# Patient Record
Sex: Male | Born: 1985 | Race: White | Hispanic: No | Marital: Single | Smoking: Current every day smoker
Health system: Southern US, Community
[De-identification: ages and names within clinical notes are randomized; demographics above are authoritative.]

## PROBLEM LIST (undated history)

## (undated) DIAGNOSIS — F32A Depression, unspecified: Secondary | ICD-10-CM

## (undated) DIAGNOSIS — F99 Mental disorder, not otherwise specified: Secondary | ICD-10-CM

## (undated) DIAGNOSIS — F419 Anxiety disorder, unspecified: Secondary | ICD-10-CM

## (undated) DIAGNOSIS — T50901A Poisoning by unspecified drugs, medicaments and biological substances, accidental (unintentional), initial encounter: Secondary | ICD-10-CM

## (undated) DIAGNOSIS — M549 Dorsalgia, unspecified: Secondary | ICD-10-CM

## (undated) HISTORY — PX: HERNIA REPAIR: SHX51

## (undated) NOTE — *Deleted (*Deleted)
D:  Patient's self inventory sheet, patient     has fair sleep, sleep medication helpful.  Fair appetite, normal energy level, good concentration.  Rated depression 7, hopeless 4, anxiety 6.  Denied withdrawals.  Denied SI.   Denied physical problems.  Physical pain, back, pain #7.  Goal is discharge.  Plans to call [facility].  Does have discharge plans. A:  Medications administered per MD orders.  Emotional support and encouragement given patient. R:  Denied SI and HI, contracts for safety.  Denied A/V hallucinations. Patient continues to lay in bed during the day.

---

## 2004-05-25 ENCOUNTER — Ambulatory Visit: Payer: Self-pay | Admitting: Psychiatry

## 2004-05-25 ENCOUNTER — Inpatient Hospital Stay (HOSPITAL_COMMUNITY): Admission: EM | Admit: 2004-05-25 | Discharge: 2004-05-28 | Payer: Self-pay | Admitting: Psychiatry

## 2004-05-29 ENCOUNTER — Other Ambulatory Visit (HOSPITAL_COMMUNITY): Admission: RE | Admit: 2004-05-29 | Discharge: 2004-07-04 | Payer: Self-pay | Admitting: Psychiatry

## 2004-06-08 ENCOUNTER — Emergency Department (HOSPITAL_COMMUNITY): Admission: EM | Admit: 2004-06-08 | Discharge: 2004-06-08 | Payer: Self-pay | Admitting: Emergency Medicine

## 2004-07-07 ENCOUNTER — Other Ambulatory Visit (HOSPITAL_COMMUNITY): Admission: RE | Admit: 2004-07-07 | Discharge: 2004-10-05 | Payer: Self-pay | Admitting: Psychiatry

## 2005-11-26 ENCOUNTER — Emergency Department (HOSPITAL_COMMUNITY): Admission: EM | Admit: 2005-11-26 | Discharge: 2005-11-26 | Payer: Self-pay | Admitting: Emergency Medicine

## 2006-03-30 ENCOUNTER — Emergency Department (HOSPITAL_COMMUNITY): Admission: EM | Admit: 2006-03-30 | Discharge: 2006-03-30 | Payer: Self-pay | Admitting: Emergency Medicine

## 2006-05-03 ENCOUNTER — Emergency Department (HOSPITAL_COMMUNITY): Admission: EM | Admit: 2006-05-03 | Discharge: 2006-05-03 | Payer: Self-pay | Admitting: Emergency Medicine

## 2006-06-05 ENCOUNTER — Emergency Department (HOSPITAL_COMMUNITY): Admission: EM | Admit: 2006-06-05 | Discharge: 2006-06-05 | Payer: Self-pay | Admitting: Emergency Medicine

## 2006-08-15 ENCOUNTER — Emergency Department (HOSPITAL_COMMUNITY): Admission: EM | Admit: 2006-08-15 | Discharge: 2006-08-15 | Payer: Self-pay | Admitting: Emergency Medicine

## 2007-02-20 ENCOUNTER — Emergency Department (HOSPITAL_COMMUNITY): Admission: EM | Admit: 2007-02-20 | Discharge: 2007-02-20 | Payer: Self-pay | Admitting: Emergency Medicine

## 2007-05-23 ENCOUNTER — Emergency Department (HOSPITAL_COMMUNITY): Admission: EM | Admit: 2007-05-23 | Discharge: 2007-05-23 | Payer: Self-pay | Admitting: Emergency Medicine

## 2007-09-30 ENCOUNTER — Emergency Department (HOSPITAL_COMMUNITY): Admission: EM | Admit: 2007-09-30 | Discharge: 2007-09-30 | Payer: Self-pay | Admitting: Emergency Medicine

## 2007-11-10 ENCOUNTER — Emergency Department (HOSPITAL_COMMUNITY): Admission: EM | Admit: 2007-11-10 | Discharge: 2007-11-11 | Payer: Self-pay | Admitting: Emergency Medicine

## 2007-11-11 ENCOUNTER — Emergency Department (HOSPITAL_COMMUNITY): Admission: EM | Admit: 2007-11-11 | Discharge: 2007-11-11 | Payer: Self-pay | Admitting: Emergency Medicine

## 2007-12-06 ENCOUNTER — Emergency Department (HOSPITAL_COMMUNITY): Admission: EM | Admit: 2007-12-06 | Discharge: 2007-12-06 | Payer: Self-pay | Admitting: Emergency Medicine

## 2007-12-10 ENCOUNTER — Emergency Department (HOSPITAL_COMMUNITY): Admission: EM | Admit: 2007-12-10 | Discharge: 2007-12-10 | Payer: Self-pay | Admitting: Emergency Medicine

## 2008-01-24 ENCOUNTER — Emergency Department (HOSPITAL_COMMUNITY): Admission: EM | Admit: 2008-01-24 | Discharge: 2008-01-24 | Payer: Self-pay | Admitting: Emergency Medicine

## 2008-04-07 ENCOUNTER — Emergency Department (HOSPITAL_COMMUNITY): Admission: EM | Admit: 2008-04-07 | Discharge: 2008-04-07 | Payer: Self-pay | Admitting: Emergency Medicine

## 2008-04-08 ENCOUNTER — Emergency Department (HOSPITAL_COMMUNITY): Admission: EM | Admit: 2008-04-08 | Discharge: 2008-04-08 | Payer: Self-pay | Admitting: Emergency Medicine

## 2008-06-23 ENCOUNTER — Emergency Department (HOSPITAL_COMMUNITY): Admission: EM | Admit: 2008-06-23 | Discharge: 2008-06-23 | Payer: Self-pay | Admitting: Emergency Medicine

## 2008-07-10 ENCOUNTER — Emergency Department (HOSPITAL_COMMUNITY): Admission: EM | Admit: 2008-07-10 | Discharge: 2008-07-10 | Payer: Self-pay | Admitting: Emergency Medicine

## 2008-07-21 ENCOUNTER — Emergency Department (HOSPITAL_COMMUNITY): Admission: EM | Admit: 2008-07-21 | Discharge: 2008-07-21 | Payer: Self-pay | Admitting: Emergency Medicine

## 2008-10-06 ENCOUNTER — Emergency Department (HOSPITAL_COMMUNITY): Admission: EM | Admit: 2008-10-06 | Discharge: 2008-10-06 | Payer: Self-pay | Admitting: Emergency Medicine

## 2009-02-20 ENCOUNTER — Emergency Department (HOSPITAL_COMMUNITY): Admission: EM | Admit: 2009-02-20 | Discharge: 2009-02-20 | Payer: Self-pay | Admitting: Emergency Medicine

## 2009-02-26 ENCOUNTER — Emergency Department (HOSPITAL_COMMUNITY): Admission: EM | Admit: 2009-02-26 | Discharge: 2009-02-26 | Payer: Self-pay | Admitting: Emergency Medicine

## 2009-04-21 ENCOUNTER — Emergency Department (HOSPITAL_COMMUNITY): Admission: EM | Admit: 2009-04-21 | Discharge: 2009-04-22 | Payer: Self-pay | Admitting: Emergency Medicine

## 2009-09-02 ENCOUNTER — Emergency Department (HOSPITAL_COMMUNITY): Admission: EM | Admit: 2009-09-02 | Discharge: 2009-09-02 | Payer: Self-pay | Admitting: Emergency Medicine

## 2009-09-10 ENCOUNTER — Emergency Department (HOSPITAL_COMMUNITY): Admission: EM | Admit: 2009-09-10 | Discharge: 2009-09-10 | Payer: Self-pay | Admitting: Emergency Medicine

## 2009-09-15 ENCOUNTER — Emergency Department: Payer: Self-pay | Admitting: Emergency Medicine

## 2009-10-06 ENCOUNTER — Ambulatory Visit: Payer: Self-pay | Admitting: Orthopedic Surgery

## 2009-10-22 ENCOUNTER — Encounter: Admission: RE | Admit: 2009-10-22 | Discharge: 2009-10-22 | Payer: Self-pay | Admitting: Sports Medicine

## 2009-10-24 ENCOUNTER — Encounter: Admission: RE | Admit: 2009-10-24 | Discharge: 2009-10-24 | Payer: Self-pay | Admitting: Orthopedic Surgery

## 2009-10-29 ENCOUNTER — Emergency Department (HOSPITAL_COMMUNITY): Admission: EM | Admit: 2009-10-29 | Discharge: 2009-10-29 | Payer: Self-pay | Admitting: Emergency Medicine

## 2010-08-25 ENCOUNTER — Emergency Department (HOSPITAL_COMMUNITY)
Admission: EM | Admit: 2010-08-25 | Discharge: 2010-08-25 | Disposition: A | Discharge status: Home / Self Care | Payer: BC Managed Care – PPO | Attending: Emergency Medicine | Admitting: Emergency Medicine

## 2010-08-25 DIAGNOSIS — Z79899 Other long term (current) drug therapy: Secondary | ICD-10-CM | POA: Insufficient documentation

## 2010-08-25 DIAGNOSIS — Z87891 Personal history of nicotine dependence: Secondary | ICD-10-CM | POA: Insufficient documentation

## 2010-08-25 DIAGNOSIS — F329 Major depressive disorder, single episode, unspecified: Secondary | ICD-10-CM | POA: Insufficient documentation

## 2010-08-25 DIAGNOSIS — F1010 Alcohol abuse, uncomplicated: Secondary | ICD-10-CM | POA: Insufficient documentation

## 2010-08-25 DIAGNOSIS — F3289 Other specified depressive episodes: Secondary | ICD-10-CM | POA: Insufficient documentation

## 2010-08-25 LAB — POCT I-STAT, CHEM 8
Hemoglobin: 17 g/dL (ref 13.0–17.0)
Sodium: 144 mEq/L (ref 135–145)
TCO2: 25 mmol/L (ref 0–100)

## 2010-08-25 LAB — RAPID URINE DRUG SCREEN, HOSP PERFORMED
Amphetamines: NOT DETECTED
Barbiturates: NOT DETECTED

## 2010-08-25 LAB — ETHANOL: Alcohol, Ethyl (B): 106 mg/dL — ABNORMAL HIGH (ref 0–10)

## 2010-09-16 LAB — URINALYSIS, ROUTINE W REFLEX MICROSCOPIC
Ketones, ur: NEGATIVE mg/dL
Nitrite: NEGATIVE
Protein, ur: NEGATIVE mg/dL
Urobilinogen, UA: 1 mg/dL (ref 0.0–1.0)
pH: 7 (ref 5.0–8.0)

## 2010-09-16 LAB — LIPASE, BLOOD: Lipase: 69 U/L — ABNORMAL HIGH (ref 11–59)

## 2010-09-16 LAB — CBC
Hemoglobin: 15.5 g/dL (ref 13.0–17.0)
MCHC: 34.5 g/dL (ref 30.0–36.0)
MCV: 94.5 fL (ref 78.0–100.0)
RBC: 4.76 MIL/uL (ref 4.22–5.81)

## 2010-09-16 LAB — DIFFERENTIAL
Basophils Absolute: 0 10*3/uL (ref 0.0–0.1)
Basophils Relative: 1 % (ref 0–1)
Eosinophils Absolute: 0.4 10*3/uL (ref 0.0–0.7)
Lymphs Abs: 2.5 10*3/uL (ref 0.7–4.0)
Neutrophils Relative %: 52 % (ref 43–77)

## 2010-09-16 LAB — COMPREHENSIVE METABOLIC PANEL
ALT: 23 U/L (ref 0–53)
CO2: 27 mEq/L (ref 19–32)
Calcium: 9.4 mg/dL (ref 8.4–10.5)
GFR calc non Af Amer: >60 mL/min (ref >60)
Glucose, Bld: 99 mg/dL (ref 70–99)
Sodium: 142 mEq/L (ref 135–145)

## 2010-10-13 LAB — COMPREHENSIVE METABOLIC PANEL
ALT: 32 U/L (ref 0–53)
Albumin: 3.8 g/dL (ref 3.5–5.2)
Alkaline Phosphatase: 64 U/L (ref 39–117)
Calcium: 9.1 mg/dL (ref 8.4–10.5)
Potassium: 3.4 mEq/L — ABNORMAL LOW (ref 3.5–5.1)
Sodium: 138 mEq/L (ref 135–145)
Total Protein: 6.4 g/dL (ref 6.0–8.3)

## 2010-10-13 LAB — DIFFERENTIAL
Basophils Relative: 0 % (ref 0–1)
Eosinophils Absolute: 0.1 10*3/uL (ref 0.0–0.7)
Lymphs Abs: 1.3 10*3/uL (ref 0.7–4.0)
Monocytes Absolute: 0.8 10*3/uL (ref 0.1–1.0)
Monocytes Relative: 13 % — ABNORMAL HIGH (ref 3–12)
Neutro Abs: 3.9 10*3/uL (ref 1.7–7.7)

## 2010-10-13 LAB — CBC
Platelets: 143 10*3/uL — ABNORMAL LOW (ref 150–400)
RDW: 12.6 % (ref 11.5–15.5)

## 2010-10-22 ENCOUNTER — Other Ambulatory Visit: Payer: Self-pay | Admitting: Internal Medicine

## 2010-10-22 DIAGNOSIS — R748 Abnormal levels of other serum enzymes: Secondary | ICD-10-CM

## 2010-10-27 ENCOUNTER — Other Ambulatory Visit: Payer: BC Managed Care – PPO

## 2010-10-30 ENCOUNTER — Emergency Department (HOSPITAL_COMMUNITY)
Admission: EM | Admit: 2010-10-30 | Discharge: 2010-10-30 | Disposition: A | Discharge status: Home / Self Care | Payer: BC Managed Care – PPO | Attending: Emergency Medicine | Admitting: Emergency Medicine

## 2010-10-30 DIAGNOSIS — R5381 Other malaise: Secondary | ICD-10-CM | POA: Insufficient documentation

## 2010-10-30 DIAGNOSIS — R42 Dizziness and giddiness: Secondary | ICD-10-CM | POA: Insufficient documentation

## 2010-10-30 DIAGNOSIS — E876 Hypokalemia: Secondary | ICD-10-CM | POA: Insufficient documentation

## 2010-10-30 DIAGNOSIS — R197 Diarrhea, unspecified: Secondary | ICD-10-CM | POA: Insufficient documentation

## 2010-10-30 DIAGNOSIS — R110 Nausea: Secondary | ICD-10-CM | POA: Insufficient documentation

## 2010-10-30 LAB — URINALYSIS, ROUTINE W REFLEX MICROSCOPIC
Glucose, UA: NEGATIVE mg/dL
Hgb urine dipstick: NEGATIVE
Protein, ur: NEGATIVE mg/dL
pH: 7 (ref 5.0–8.0)

## 2010-10-31 LAB — POCT I-STAT, CHEM 8
BUN: 8 mg/dL (ref 6–23)
Chloride: 95 mEq/L — ABNORMAL LOW (ref 96–112)
Creatinine, Ser: 1.1 mg/dL (ref 0.4–1.5)
Sodium: 132 mEq/L — ABNORMAL LOW (ref 135–145)
TCO2: 26 mmol/L (ref 0–100)

## 2010-11-10 ENCOUNTER — Emergency Department (HOSPITAL_COMMUNITY): Admission: EM | Admit: 2010-11-10 | Payer: BC Managed Care – PPO | Source: Home / Self Care

## 2010-11-10 ENCOUNTER — Emergency Department (INDEPENDENT_AMBULATORY_CARE_PROVIDER_SITE_OTHER): Payer: BC Managed Care – PPO

## 2010-11-10 ENCOUNTER — Emergency Department (HOSPITAL_BASED_OUTPATIENT_CLINIC_OR_DEPARTMENT_OTHER)
Admission: EM | Admit: 2010-11-10 | Discharge: 2010-11-10 | Disposition: A | Discharge status: Home / Self Care | Payer: BC Managed Care – PPO | Attending: Emergency Medicine | Admitting: Emergency Medicine

## 2010-11-10 DIAGNOSIS — F17200 Nicotine dependence, unspecified, uncomplicated: Secondary | ICD-10-CM | POA: Insufficient documentation

## 2010-11-10 DIAGNOSIS — M545 Low back pain, unspecified: Secondary | ICD-10-CM | POA: Insufficient documentation
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
## 2010-12-30 ENCOUNTER — Emergency Department (HOSPITAL_COMMUNITY)
Admission: EM | Admit: 2010-12-30 | Discharge: 2010-12-30 | Disposition: A | Discharge status: Home / Self Care | Payer: BC Managed Care – PPO | Attending: Emergency Medicine | Admitting: Emergency Medicine

## 2010-12-30 DIAGNOSIS — H60399 Other infective otitis externa, unspecified ear: Secondary | ICD-10-CM | POA: Insufficient documentation

## 2010-12-30 DIAGNOSIS — H6120 Impacted cerumen, unspecified ear: Secondary | ICD-10-CM | POA: Insufficient documentation

## 2010-12-31 ENCOUNTER — Emergency Department (HOSPITAL_BASED_OUTPATIENT_CLINIC_OR_DEPARTMENT_OTHER)
Admission: EM | Admit: 2010-12-31 | Discharge: 2010-12-31 | Disposition: A | Discharge status: Home / Self Care | Payer: BC Managed Care – PPO | Attending: Emergency Medicine | Admitting: Emergency Medicine

## 2010-12-31 DIAGNOSIS — H9209 Otalgia, unspecified ear: Secondary | ICD-10-CM | POA: Insufficient documentation

## 2010-12-31 DIAGNOSIS — H60399 Other infective otitis externa, unspecified ear: Secondary | ICD-10-CM | POA: Insufficient documentation

## 2011-04-07 LAB — CBC
MCHC: 34.9
MCV: 90.8
RBC: 5.41
RDW: 12.8

## 2011-04-07 LAB — DIFFERENTIAL
Basophils Absolute: 0
Basophils Relative: 1
Eosinophils Absolute: 0.4
Monocytes Absolute: 0.7
Monocytes Relative: 10
Neutro Abs: 2.7
Neutrophils Relative %: 37 — ABNORMAL LOW

## 2011-04-07 LAB — HEPATIC FUNCTION PANEL
ALT: 19
AST: 29
Total Protein: 6.5

## 2011-04-07 LAB — URINALYSIS, ROUTINE W REFLEX MICROSCOPIC
Glucose, UA: NEGATIVE
Hgb urine dipstick: NEGATIVE
Protein, ur: NEGATIVE
Specific Gravity, Urine: 1.015
pH: 6.5

## 2011-04-07 LAB — POCT CARDIAC MARKERS
CKMB, poc: 1.1
Myoglobin, poc: 37.4

## 2011-04-07 LAB — BASIC METABOLIC PANEL
CO2: 28
Calcium: 9.4
Chloride: 108
Creatinine, Ser: 1.26
GFR calc Af Amer: >60
Glucose, Bld: 95

## 2011-04-07 LAB — RAPID URINE DRUG SCREEN, HOSP PERFORMED
Amphetamines: NOT DETECTED
Barbiturates: NOT DETECTED
Benzodiazepines: NOT DETECTED
Cocaine: NOT DETECTED
Opiates: NOT DETECTED

## 2011-04-07 LAB — D-DIMER, QUANTITATIVE: D-Dimer, Quant: <0.22

## 2011-05-06 ENCOUNTER — Encounter (HOSPITAL_COMMUNITY): Payer: Self-pay | Admitting: Emergency Medicine

## 2011-05-06 ENCOUNTER — Emergency Department (HOSPITAL_COMMUNITY)
Admission: EM | Admit: 2011-05-06 | Discharge: 2011-05-06 | Disposition: A | Discharge status: Home / Self Care | Payer: BC Managed Care – PPO | Attending: Emergency Medicine | Admitting: Emergency Medicine

## 2011-05-06 ENCOUNTER — Emergency Department (HOSPITAL_COMMUNITY): Payer: BC Managed Care – PPO

## 2011-05-06 DIAGNOSIS — W219XXA Striking against or struck by unspecified sports equipment, initial encounter: Secondary | ICD-10-CM | POA: Insufficient documentation

## 2011-05-06 DIAGNOSIS — Y9322 Activity, ice hockey: Secondary | ICD-10-CM | POA: Insufficient documentation

## 2011-05-06 DIAGNOSIS — S0990XA Unspecified injury of head, initial encounter: Secondary | ICD-10-CM

## 2011-05-06 DIAGNOSIS — Y9239 Other specified sports and athletic area as the place of occurrence of the external cause: Secondary | ICD-10-CM | POA: Insufficient documentation
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
## 2011-05-07 NOTE — ED Provider Notes (Signed)
Medical screening examination/treatment/procedure(s) were performed by non-physician practitioner and as supervising physician I was immediately available for consultation/collaboration.   Taite Schoeppner M Verle Wheeling, DO 05/07/11 0337 

## 2012-04-21 ENCOUNTER — Encounter (HOSPITAL_COMMUNITY): Payer: Self-pay | Admitting: Family Medicine

## 2012-04-21 ENCOUNTER — Emergency Department (HOSPITAL_COMMUNITY)
Admission: EM | Admit: 2012-04-21 | Discharge: 2012-04-21 | Disposition: A | Discharge status: Home / Self Care | Payer: Self-pay | Attending: Emergency Medicine | Admitting: Emergency Medicine

## 2012-04-21 DIAGNOSIS — F1010 Alcohol abuse, uncomplicated: Secondary | ICD-10-CM | POA: Insufficient documentation

## 2012-04-21 LAB — CBC
HCT: 47.7 % (ref 39.0–52.0)
MCV: 91.9 fL (ref 78.0–100.0)
Platelets: 174 10*3/uL (ref 150–400)
RBC: 5.19 MIL/uL (ref 4.22–5.81)
WBC: 7.2 10*3/uL (ref 4.0–10.5)

## 2012-04-21 LAB — URINALYSIS, ROUTINE W REFLEX MICROSCOPIC
Leukocytes, UA: NEGATIVE
Protein, ur: NEGATIVE mg/dL
Urobilinogen, UA: 1 mg/dL (ref 0.0–1.0)

## 2012-04-21 LAB — COMPREHENSIVE METABOLIC PANEL
AST: 122 U/L — ABNORMAL HIGH (ref 0–37)
Alkaline Phosphatase: 80 U/L (ref 39–117)
CO2: 27 mEq/L (ref 19–32)
Chloride: 107 mEq/L (ref 96–112)
Creatinine, Ser: 1.17 mg/dL (ref 0.50–1.35)
GFR calc non Af Amer: 85 mL/min — ABNORMAL LOW (ref >90)
Total Bilirubin: 1 mg/dL (ref 0.3–1.2)

## 2012-04-21 LAB — RAPID URINE DRUG SCREEN, HOSP PERFORMED
Opiates: NOT DETECTED
Tetrahydrocannabinol: POSITIVE — AB

## 2012-04-21 LAB — SALICYLATE LEVEL: Salicylate Lvl: <2 mg/dL — ABNORMAL LOW (ref 2.8–20.0)

## 2012-04-21 MED ORDER — NICOTINE 21 MG/24HR TD PT24
21.0000 mg | MEDICATED_PATCH | Freq: Once | TRANSDERMAL | Status: DC
  Administered 2012-04-21: 21 mg via TRANSDERMAL
  Filled 2012-04-21: qty 1

## 2012-04-21 NOTE — ED Notes (Signed)
Patient brought here by [employer] for medical clearance. Patient states that he has an alcohol addiction and is requesting detox. States that he has used [employer], opiates, marijuana and heroin. Last used alcohol "within the hour." Reports that he has been drinking 1/5 of Fireball whiskey per day.

## 2012-12-09 ENCOUNTER — Emergency Department (HOSPITAL_COMMUNITY): Payer: Self-pay

## 2012-12-09 ENCOUNTER — Encounter (HOSPITAL_COMMUNITY): Payer: Self-pay | Admitting: Emergency Medicine

## 2012-12-09 ENCOUNTER — Emergency Department (HOSPITAL_COMMUNITY)
Admission: EM | Admit: 2012-12-09 | Discharge: 2012-12-10 | Disposition: A | Discharge status: Home / Self Care | Payer: BC Managed Care – PPO | Attending: Emergency Medicine | Admitting: Emergency Medicine

## 2012-12-09 DIAGNOSIS — F1010 Alcohol abuse, uncomplicated: Secondary | ICD-10-CM

## 2012-12-09 DIAGNOSIS — H538 Other visual disturbances: Secondary | ICD-10-CM | POA: Insufficient documentation

## 2012-12-09 DIAGNOSIS — R45851 Suicidal ideations: Secondary | ICD-10-CM

## 2012-12-09 DIAGNOSIS — R55 Syncope and collapse: Secondary | ICD-10-CM | POA: Insufficient documentation

## 2012-12-09 DIAGNOSIS — M549 Dorsalgia, unspecified: Secondary | ICD-10-CM | POA: Insufficient documentation

## 2012-12-09 HISTORY — DX: Dorsalgia, unspecified: M54.9

## 2012-12-09 LAB — COMPREHENSIVE METABOLIC PANEL
Albumin: 3.3 g/dL — ABNORMAL LOW (ref 3.5–5.2)
BUN: 4 mg/dL — ABNORMAL LOW (ref 6–23)
Calcium: 8.9 mg/dL (ref 8.4–10.5)
Creatinine, Ser: 1.13 mg/dL (ref 0.50–1.35)
Potassium: 4.1 mEq/L (ref 3.5–5.1)
Total Protein: 7.1 g/dL (ref 6.0–8.3)

## 2012-12-09 LAB — CBC
HCT: 47.3 % (ref 39.0–52.0)
MCH: 34.1 pg — ABNORMAL HIGH (ref 26.0–34.0)
MCHC: 35.7 g/dL (ref 30.0–36.0)
MCV: 95.4 fL (ref 78.0–100.0)
RDW: 13.9 % (ref 11.5–15.5)

## 2012-12-09 LAB — RAPID URINE DRUG SCREEN, HOSP PERFORMED
Amphetamines: NOT DETECTED
Benzodiazepines: NOT DETECTED
Cocaine: NOT DETECTED
Opiates: NOT DETECTED

## 2012-12-09 LAB — ETHANOL: Alcohol, Ethyl (B): 296 mg/dL — ABNORMAL HIGH (ref 0–11)

## 2012-12-09 MED ORDER — FOLIC ACID 1 MG PO TABS
1.0000 mg | ORAL_TABLET | Freq: Every day | ORAL | Status: DC
  Filled 2012-12-09: qty 1

## 2012-12-09 MED ORDER — ALUM & MAG HYDROXIDE-SIMETH 200-200-20 MG/5ML PO SUSP: 30.0000 mL | ORAL | Status: DC | PRN

## 2012-12-09 MED ORDER — LORAZEPAM 1 MG PO TABS
1.0000 mg | ORAL_TABLET | Freq: Three times a day (TID) | ORAL | Status: DC | PRN
  Administered 2012-12-09: 1 mg via ORAL
  Filled 2012-12-09: qty 1

## 2012-12-09 MED ORDER — ZOLPIDEM TARTRATE 5 MG PO TABS: 5.0000 mg | ORAL_TABLET | Freq: Every evening | ORAL | Status: DC | PRN

## 2012-12-09 MED ORDER — ONDANSETRON HCL 4 MG PO TABS
4.0000 mg | ORAL_TABLET | Freq: Three times a day (TID) | ORAL | Status: DC | PRN
  Administered 2012-12-10: 4 mg via ORAL
  Filled 2012-12-09: qty 1

## 2012-12-09 MED ORDER — NICOTINE 21 MG/24HR TD PT24
21.0000 mg | MEDICATED_PATCH | Freq: Every day | TRANSDERMAL | Status: DC
  Administered 2012-12-09: 21 mg via TRANSDERMAL
  Filled 2012-12-09 (×2): qty 1

## 2012-12-09 MED ORDER — IBUPROFEN 600 MG PO TABS: 600.0000 mg | ORAL_TABLET | Freq: Three times a day (TID) | ORAL | Status: DC | PRN

## 2012-12-09 MED ORDER — THIAMINE HCL 100 MG/ML IJ SOLN: 100.0000 mg | Freq: Every day | INTRAMUSCULAR | Status: DC

## 2012-12-09 MED ORDER — ADULT MULTIVITAMIN W/MINERALS CH
1.0000 | ORAL_TABLET | Freq: Every day | ORAL | Status: DC
  Filled 2012-12-09: qty 1

## 2012-12-09 MED ORDER — VITAMIN B-1 100 MG PO TABS
100.0000 mg | ORAL_TABLET | Freq: Every day | ORAL | Status: DC
  Administered 2012-12-10: 100 mg via ORAL
  Filled 2012-12-09: qty 1

## 2012-12-09 NOTE — ED Provider Notes (Signed)
History    This chart was scribed for Raymon Mutton, non-physician practitioner working with Vida Roller, MD by Leone Payor, ED Scribe. This patient was seen in room WTR4/WLPT4 and the patient's care was started at 1844.   CSN: [id number]  Arrival date & time 12/09/12  1844   First MD Initiated Contact with Patient 12/09/12 1920      Chief Complaint  Patient presents with  . ETOH detox      The history is provided by the patient. No language interpreter was used.    HPI Comments: Larry Adams is a 27 y.o. male brought in by ambulance, who presents to the Emergency Department requesting medical clearance today. Per EMS note, pt was at [facility] early this afternoon seeking ETOH detox. He was directed to come to ED to be evaluated but he went home instead. He started drinking again. He takes 6mg  of klonopin daily. Family called EMS to transport pt to the ED. Pt reports he has been drinking enough to get drunk everyday for the past 6 years. He drinks beer and liquor both. He reports feeling agitated with himself lately. He has been feeling upset and sad, has been crying a few times in the past 1.5 days. Both his parents are alcoholics. His alcoholism has progressed since he lost his cousin 2 years ago. He has SI and states he doesn't have much to live for. He denies having detox in the past. Pt reports falling down steps yesterday. He fell again on his back today. Pt hit his head during the fall and believes he blacked out for a few seconds today after his fall. He has associated back pain and HA from the fall. He also has associated nausea and vomiting (from drinking). Last alcohol use (2 beers) was 3 hours ago. He denies having shaking or tremors, HI currently. He denies dizziness, blurred vision, numbness or tingling, neck pain, chest pain, SOB.  He admits to using marijuana but denies cocaine or heroin use. Pt is a current everyday smoker.   Past Medical History  Diagnosis Date  . Back  pain     Past Surgical History  Procedure Laterality Date  . Hernia repair      No family history on file.  History  Substance Use Topics  . Smoking status: Never Smoker   . Smokeless tobacco: Not on file  . Alcohol Use: Yes     Comment: 1/5 whiskey per day      Review of Systems  HENT: Negative for neck pain.   Eyes: Positive for photophobia. Negative for visual disturbance.  Respiratory: Negative for shortness of breath.   Cardiovascular: Negative for chest pain.  Gastrointestinal: Negative for nausea, vomiting, abdominal pain and diarrhea.  Musculoskeletal: Positive for back pain.  Neurological: Positive for syncope. Negative for dizziness, tremors, weakness and numbness.  Psychiatric/Behavioral: Positive for suicidal ideas and agitation. Negative for confusion.  All other systems reviewed and are negative.    Allergies  Review of patient's allergies indicates no known allergies.  Home Medications   Current Outpatient Rx  Name  Route  Sig  Dispense  Refill  . clonazePAM (KLONOPIN) 1 MG tablet   Oral   Take 1 mg by mouth 2 (two) times daily as needed. sleep           BP 122/74  Pulse 115  Temp(Src) 98.7 F (37.1 C) (Oral)  Resp 20  SpO2 95%  Physical Exam  Nursing note and vitals reviewed. Constitutional: He  is oriented to person, place, and time. He appears well-developed and well-nourished. No distress.  HENT:  Head: Normocephalic and atraumatic.  Eyes: Conjunctivae and EOM are normal. Pupils are equal, round, and reactive to light.  Neck: Normal range of motion. Neck supple.  Cardiovascular: Normal rate, regular rhythm and normal heart sounds.  Exam reveals no friction rub.   No murmur heard. Pulmonary/Chest: Effort normal and breath sounds normal. No respiratory distress. He has no wheezes. He has no rales.  Abdominal: Soft. Bowel sounds are normal. There is no tenderness.  Obese   Musculoskeletal: Normal range of motion.  Lymphadenopathy:     He has no cervical adenopathy.  Neurological: He is alert and oriented to person, place, and time. No cranial nerve deficit.  Cranial nerves III-XII grossly intact  Skin: Skin is warm and dry.  Psychiatric: He has a normal mood and affect. His behavior is normal.  Negative shakes and tremors noted    ED Course  Procedures (including critical care time)  DIAGNOSTIC STUDIES: Oxygen Saturation is 95% on RA, adequate by my interpretation.    COORDINATION OF CARE: 7:41 PM Discussed treatment plan with pt at bedside and pt agreed to plan.   8:04 PM Spoke with Patsy Lager from ACT team.   Labs Reviewed - No data to display No results found.   No diagnosis found.    MDM  I personally performed the services described in this documentation, which was scribed in my presence. The recorded information has been reviewed and is accurate.  Patient presenting to the ED with ETOH abuse and wishes for detox. Patient reported that he has been feeling depressed over the past couple of days. Stated that he has "no reason to live' - has no plan. Denied thoughts of hurting others. ACT consult performed. CIWA every 4 hours. Telepsych ordered. Patient medically cleared. Patient moved to psych ED. Discussed case with Dr. Jenean Lindau at change of shift, transfer of care to Dr. Jenean Lindau at 2:30AM 12/10/2012.  Raymon Mutton, PA-C 12/10/12 [phone]

## 2012-12-09 NOTE — ED Notes (Signed)
Per EMS, was at [facility] early this afternoon seeking etoh detox-was told to come to ed for eval-went home instead and started drinking and taking klonopin-family called EMS to transport to hospital

## 2012-12-09 NOTE — ED Notes (Signed)
Pt  has been seen by security and has been changed into scrubs.   Pt has one bag of belongings.

## 2012-12-09 NOTE — ED Notes (Signed)
Pt, has been drinking for 2 weeks straight-last drink was 1 hour ago

## 2012-12-10 ENCOUNTER — Inpatient Hospital Stay (HOSPITAL_COMMUNITY): Admission: AD | Admit: 2012-12-10 | Payer: BC Managed Care – PPO | Source: Intra-hospital | Admitting: Psychiatry

## 2012-12-10 ENCOUNTER — Telehealth (HOSPITAL_COMMUNITY): Payer: Self-pay | Admitting: Licensed Clinical Social Worker

## 2012-12-10 DIAGNOSIS — F39 Unspecified mood [affective] disorder: Secondary | ICD-10-CM

## 2012-12-10 DIAGNOSIS — F1010 Alcohol abuse, uncomplicated: Secondary | ICD-10-CM

## 2012-12-10 LAB — URINE MICROSCOPIC-ADD ON

## 2012-12-10 LAB — URINALYSIS, ROUTINE W REFLEX MICROSCOPIC
Glucose, UA: NEGATIVE mg/dL
Hgb urine dipstick: NEGATIVE
Ketones, ur: NEGATIVE mg/dL
Protein, ur: NEGATIVE mg/dL
pH: 6 (ref 5.0–8.0)

## 2012-12-10 MED ORDER — LORAZEPAM 1 MG PO TABS
0.0000 mg | ORAL_TABLET | Freq: Four times a day (QID) | ORAL | Status: DC
  Administered 2012-12-10: 1 mg via ORAL

## 2012-12-10 MED ORDER — ADULT MULTIVITAMIN W/MINERALS CH
1.0000 | ORAL_TABLET | Freq: Every day | ORAL | Status: DC
  Administered 2012-12-10: 1 via ORAL

## 2012-12-10 MED ORDER — VITAMIN B-1 100 MG PO TABS: 100.0000 mg | ORAL_TABLET | Freq: Every day | ORAL | Status: DC

## 2012-12-10 MED ORDER — FOLIC ACID 1 MG PO TABS
1.0000 mg | ORAL_TABLET | Freq: Every day | ORAL | Status: DC
  Administered 2012-12-10 (×2): 1 mg via ORAL

## 2012-12-10 MED ORDER — LORAZEPAM 1 MG PO TABS: 0.0000 mg | ORAL_TABLET | Freq: Two times a day (BID) | ORAL | Status: DC

## 2012-12-10 MED ORDER — LORAZEPAM 1 MG PO TABS
1.0000 mg | ORAL_TABLET | Freq: Four times a day (QID) | ORAL | Status: DC | PRN
  Filled 2012-12-10: qty 1

## 2012-12-10 MED ORDER — LORAZEPAM 2 MG/ML IJ SOLN: 1.0000 mg | Freq: Four times a day (QID) | INTRAMUSCULAR | Status: DC | PRN

## 2012-12-10 MED ORDER — THIAMINE HCL 100 MG/ML IJ SOLN: 100.0000 mg | Freq: Every day | INTRAMUSCULAR | Status: DC

## 2012-12-10 NOTE — Progress Notes (Signed)
Patient ID: Larry Adams, male   DOB: 04-28-1986, 28 y.o.   MRN: [id number] Duplicate note

## 2012-12-10 NOTE — [facility] Counselor (Signed)
Patient accepted to [facility] by Leonette Most, [place] 12/09/2012 to Dr. Elsie Saas. The room assignment is 506-1. Bobby-ACT counselor made aware of patient's acceptance. Per AC-Sh[facility]ita patient to come after 3pm.

## 2012-12-10 NOTE — Progress Notes (Signed)
[facility] MD Progress Note  12/10/2012 1:30 PM Larry Adams  MRN:  [id number] Subjective:  Interviewed patient this afternoon who has a bed in our inpatient unit for alcohol detoxification but is refusing admission. Patient stated he wanted to detox here in the [facility] for only one day and that he is not willing to stay or go inpatient.  Patient is here voluntarily and is willing to go home to take care of family issues. He reports to this Clinical research associate that his father need him now at  home.  He also stated he is aware he need help but he is not ready at this time.  Patient reports he has been diagnosed by his primary care doctor with bipolar and anxiety.  He also stated that his primary care doctor orders Klonopin and Librium for him.  He stated he uses Klonopin for anxiety and bipolar and uses librium to detox at home.  Patient reports he has some left over Librium in his truck and that he longer uses them due to GI disconfort.  He was advised to send the Librium to the nearest Pharmacy since he is no longer using them.  Patient verbalizes understanding.  He is fully awake, alert and oriented x4.  He denies SI/HI/AVH.  He will be d/c by Dr Cordelia Poche. Diagnosis:   Axis I: Alcohol Abuse and Mood Disorder NOS Axis II: Deferred Axis III:  Past Medical History  Diagnosis Date  . Back pain    Axis IV: educational problems, other psychosocial or environmental problems and problems related to social environment Axis V: 61-70 mild symptoms  ADL's:  Impaired  Sleep: Good  Appetite:  Fair  Suicidal Ideation:  Plan:  none Intent:  none Means:  none Homicidal Ideation:  Plan:  none Intent:  none Means:  none AEB (as evidenced by):  Psychiatric Specialty Exam: Review of Systems  Constitutional: Negative.   HENT: Negative.   Eyes: Negative.   Respiratory: Negative.   Cardiovascular: Negative.   Gastrointestinal: Negative.   Musculoskeletal: Negative.   Skin: Negative.   Endo/Heme/Allergies: Negative.    Psychiatric/Behavioral: Positive for depression (Reports hx og bipolar d/o by his medical provider) and substance abuse (Reports long hx of alcoholism since age 89). Negative for suicidal ideas, hallucinations and memory loss. The patient is nervous/anxious (Placed on Klonopin x 5 yeasr for bipolar by his primary physician). The patient does not have insomnia.     Blood pressure 154/69, pulse 105, temperature 98.1 F (36.7 C), temperature source Oral, resp. rate 20, SpO2 95.00%.There is no height or weight on file to calculate BMI.  General Appearance: Disheveled  Eye Solicitor::  Fair  Speech:  Clear and Coherent  Volume:  Normal  Mood:  Depressed  Affect:  Congruent  Thought Process:  Coherent  Orientation:  Full (Time, Place, and Person)  Thought Content:  WDL  Suicidal Thoughts:  No  Homicidal Thoughts:  No  Memory:  Immediate;   Good Recent;   Good Remote;   Good  Judgement:  Poor  Insight:  Fair  Psychomotor Activity:  Normal  Concentration:  Good  Recall:  Good  Akathisia:  No  Handed:  Right  AIMS (if indicated):     Assets:  Desire for Improvement  Sleep:      Current Medications: Current Facility-Administered Medications  Medication Dose Route Frequency Provider Last Rate Last Dose  . alum & mag hydroxide-simeth (MAALOX/MYLANTA) 200-200-20 MG/5ML suspension 30 mL  30 mL Oral PRN Marissa Sciacca, PA-C      .  folic acid (FOLVITE) tablet 1 mg  1 mg Oral Daily Marissa Sciacca, PA-C      . folic acid (FOLVITE) tablet 1 mg  1 mg Oral Daily Olivia Mackie, MD   1 mg at 12/10/12 8413  . ibuprofen (ADVIL,MOTRIN) tablet 600 mg  600 mg Oral Q8H PRN Marissa Sciacca, PA-C      . LORazepam (ATIVAN) tablet 1 mg  1 mg Oral Q6H PRN Olivia Mackie, MD       Or  . LORazepam (ATIVAN) injection 1 mg  1 mg Intravenous Q6H PRN Olivia Mackie, MD      . LORazepam (ATIVAN) tablet 0-4 mg  0-4 mg Oral Q6H Olivia Mackie, MD   1 mg at 12/10/12 2440   Followed by  . [START ON 12/12/2012] LORazepam  (ATIVAN) tablet 0-4 mg  0-4 mg Oral Q12H Olivia Mackie, MD      . LORazepam (ATIVAN) tablet 1 mg  1 mg Oral Q8H PRN Marissa Sciacca, PA-C   1 mg at 12/09/12 2144  . multivitamin with minerals tablet 1 tablet  1 tablet Oral Daily Marissa Sciacca, PA-C      . multivitamin with minerals tablet 1 tablet  1 tablet Oral Daily Olivia Mackie, MD   1 tablet at 12/10/12 [phone]  . nicotine (NICODERM CQ - dosed in mg/24 hours) patch 21 mg  21 mg Transdermal Daily Marissa Sciacca, PA-C   21 mg at 12/09/12 2143  . ondansetron (ZOFRAN) tablet 4 mg  4 mg Oral Q8H PRN Marissa Sciacca, PA-C   4 mg at 12/10/12 0951  . thiamine (VITAMIN B-1) tablet 100 mg  100 mg Oral Daily Marissa Sciacca, PA-C   100 mg at 12/10/12 2536   Or  . thiamine (B-1) injection 100 mg  100 mg Intravenous Daily Marissa Sciacca, PA-C      . zolpidem (AMBIEN) tablet 5 mg  5 mg Oral QHS PRN Raymon Mutton, PA-C       Current Outpatient Prescriptions  Medication Sig Dispense Refill  . clonazePAM (KLONOPIN) 1 MG tablet Take 1 mg by mouth 2 (two) times daily as needed. sleep        Lab Results:  Results for orders placed during the hospital encounter of 12/09/12 (from the past 48 hour(s))  URINE RAPID DRUG SCREEN (HOSP PERFORMED)     Status: Abnormal   Collection Time    12/09/12  7:30 PM      Result Value Range   Opiates NONE DETECTED  NONE DETECTED   Cocaine NONE DETECTED  NONE DETECTED   Benzodiazepines NONE DETECTED  NONE DETECTED   Amphetamines NONE DETECTED  NONE DETECTED   Tetrahydrocannabinol POSITIVE (*) NONE DETECTED   Barbiturates NONE DETECTED  NONE DETECTED   Comment:            DRUG SCREEN FOR MEDICAL PURPOSES     ONLY.  IF CONFIRMATION IS NEEDED     FOR ANY PURPOSE, NOTIFY LAB     WITHIN 5 DAYS.                LOWEST DETECTABLE LIMITS     FOR URINE DRUG SCREEN     Drug Class       Cutoff (ng/mL)     Amphetamine      1000     Barbiturate      200     Benzodiazepine   200     Tricyclics  300     Opiates           300     Cocaine          300     THC              50  URINALYSIS, ROUTINE W REFLEX MICROSCOPIC     Status: Abnormal   Collection Time    12/09/12  7:30 PM      Result Value Range   Color, Urine AMBER (*) YELLOW   Comment: BIOCHEMICALS MAY BE AFFECTED BY COLOR   APPearance CLOUDY (*) CLEAR   Specific Gravity, Urine 1.023  1.005 - 1.030   pH 6.0  5.0 - 8.0   Glucose, UA NEGATIVE  NEGATIVE mg/dL   Hgb urine dipstick NEGATIVE  NEGATIVE   Bilirubin Urine SMALL (*) NEGATIVE   Ketones, ur NEGATIVE  NEGATIVE mg/dL   Protein, ur NEGATIVE  NEGATIVE mg/dL   Urobilinogen, UA 1.0  0.0 - 1.0 mg/dL   Nitrite NEGATIVE  NEGATIVE   Leukocytes, UA TRACE (*) NEGATIVE  URINE MICROSCOPIC-ADD ON     Status: Abnormal   Collection Time    12/09/12  7:30 PM      Result Value Range   WBC, UA 0-2  <3 WBC/hpf   Crystals CA OXALATE CRYSTALS (*) NEGATIVE   Urine-Other MUCOUS PRESENT    CBC     Status: Abnormal   Collection Time    12/09/12  7:40 PM      Result Value Range   WBC 7.1  4.0 - 10.5 K/uL   RBC 4.96  4.22 - 5.81 MIL/uL   Hemoglobin 16.9  13.0 - 17.0 g/dL   HCT 96.0  45.4 - 09.8 %   MCV 95.4  78.0 - 100.0 fL   MCH 34.1 (*) 26.0 - 34.0 pg   MCHC 35.7  30.0 - 36.0 g/dL   RDW 11.9  14.7 - 82.9 %   Platelets 183  150 - 400 K/uL  COMPREHENSIVE METABOLIC PANEL     Status: Abnormal   Collection Time    12/09/12  7:40 PM      Result Value Range   Sodium 146 (*) 135 - 145 mEq/L   Potassium 4.1  3.5 - 5.1 mEq/L   Chloride 112  96 - 112 mEq/L   CO2 24  19 - 32 mEq/L   Glucose, Bld 109 (*) 70 - 99 mg/dL   BUN 4 (*) 6 - 23 mg/dL   Creatinine, Ser 5.62  0.50 - 1.35 mg/dL   Calcium 8.9  8.4 - 13.0 mg/dL   Total Protein 7.1  6.0 - 8.3 g/dL   Albumin 3.3 (*) 3.5 - 5.2 g/dL   AST 865 (*) 0 - 37 U/L   ALT 79 (*) 0 - 53 U/L   Alkaline Phosphatase 129 (*) 39 - 117 U/L   Total Bilirubin 1.1  0.3 - 1.2 mg/dL   GFR calc non Af Amer 88 (*) >90 mL/min   GFR calc Af Amer >90  >90 mL/min   Comment:             The eGFR has been calculated     using the CKD EPI equation.     This calculation has not been     validated in all clinical     situations.     eGFR's persistently     <90 mL/min signify     possible Chronic Kidney Disease.  ETHANOL  Status: Abnormal   Collection Time    12/09/12  7:40 PM      Result Value Range   Alcohol, Ethyl (B) 296 (*) 0 - 11 mg/dL   Comment:            LOWEST DETECTABLE LIMIT FOR     SERUM ALCOHOL IS 11 mg/dL     FOR MEDICAL PURPOSES ONLY    Physical Findings: AIMS:  , ,  ,  ,    CIWA:  CIWA-Ar Total: 11 COWS:     Treatment Plan Summary: Daily contact with patient to assess and evaluate symptoms and progress in treatment Medication management  Plan:  Consult with Dr Freida Busman .  Discussed patient's refusal to be admitted to inpatient unit for detox from alcohol.  Patient will be d/c now.  Medical Decision Making Problem Points:  Established problem, stable/improving (1) Data Points:  Review and summation of old records (2)  I certify that inpatient services furnished can reasonably be expected to improve the patient's condition.   Dahlia Byes, C 12/10/2012, 1:30 PM

## 2012-12-10 NOTE — ED Notes (Signed)
C/o nausea, gingerale given.

## 2012-12-10 NOTE — Progress Notes (Addendum)
Patient ID: Larry Adams, male   DOB: 05/04/1986, 27 y.o.   MRN: [id number] S-Pt requesting Etoh rx -c/o daily intoxication x 6 years O BAC 296-Sleeping Heavy odor of etoh on breath     No Beds at [facility] presently     ACT unable to place ar [facility] A Alcohol dependence with acute intoxication P Continue Medical Clearance/Consider [facility] admission if bed becomes available

## 2012-12-10 NOTE — BH Assessment (Signed)
Assessment Note   Larry Adams is an 27 y.o. male. Patient presents to [facility] requesting detox from alcohol. Patient is drinking daily as much as he can get his hands on and whatever he can get his hands on (beer or liquor). He denies suicidality, homicidality or psychosis. Patient states that he is ready for a change and seriously wants treatment.  [facility] will not have beds until Monday and patient does not have transportation for [facility]. Patient's information will be sent to [facility] for consideration for admission.  Axis I: Alcohol Dependence and THC Abuse Axis II: Deferred Axis III:  Past Medical History  Diagnosis Date  . Back pain    Axis IV: economic problems, housing problems, occupational problems, other psychosocial or environmental problems, problems related to social environment and problems with primary support group Axis V: 45  Past Medical History:  Past Medical History  Diagnosis Date  . Back pain     Past Surgical History  Procedure Laterality Date  . Hernia repair      Family History: No family history on file.  Social History:  reports that he has never smoked. He does not have any smokeless tobacco history on file. He reports that  drinks alcohol. He reports that he uses illicit drugs (Marijuana).  Additional Social History:  Alcohol / Drug Use History of alcohol / drug use?: Yes Substance #1 Name of Substance 1: Etoh 1 - Age of First Use: 17 1 - Amount (size/oz): "whatever is available" 1 - Frequency: Daily 1 - Duration: 6 years 1 - Last Use / Amount: 12/09/2012/ " A lot" Substance #2 Name of Substance 2: THC 2 - Age of First Use: 17 2 - Amount (size/oz): varies 2 - Frequency: varies 2 - Duration: years  CIWA: CIWA-Ar BP: 138/84 mmHg Pulse Rate: 99 Nausea and Vomiting: no nausea and no vomiting Tactile Disturbances: none Tremor: not visible, but can be felt fingertip to fingertip Auditory Disturbances: not present Paroxysmal Sweats: three Visual  Disturbances: not present Anxiety: three Headache, Fullness in Head: mild Agitation: two Orientation and Clouding of Sensorium: oriented and can do serial additions CIWA-Ar Total: 11 COWS:    Allergies: No Known Allergies  Home Medications:  (Not in a hospital admission)  OB/GYN Status:  No LMP for male patient.  General Assessment Data Location of Assessment: [facility] ED Living Arrangements: Other (Comment) (Homeless) Can pt return to current living arrangement?: Yes Admission Status: Voluntary Is patient capable of signing voluntary admission?: Yes Transfer from: Home Referral Source: MD  Education Status Is patient currently in school?: No Current Grade:  (Na) Highest grade of school patient has completed:  (12th) Name of school: [employer] Contact person: Ellingson Rahmir/ father  Risk to self Suicidal Ideation: No Suicidal Intent: No Is patient at risk for suicide?: No Suicidal Plan?: No Access to Means: No What has been your use of drugs/alcohol within the last 12 months?:  (Daily) Previous Attempts/Gestures: No How many times?:  (Na) Other Self Harm Risks:  (Na) Triggers for Past Attempts: None known Intentional Self Injurious Behavior: None Family Suicide History: No Recent stressful life event(s):  (Homeless) Persecutory voices/beliefs?: No Depression: No Depression Symptoms:  (None) Substance abuse history and/or treatment for substance abuse?: Yes Suicide prevention information given to non-admitted patients: Not applicable  Risk to Others Homicidal Ideation: No Thoughts of Harm to Others: No Current Homicidal Intent: No Current Homicidal Plan: No Access to Homicidal Means: No Identified Victim:  (Na) History of harm to others?: No  Assessment of Violence: None Noted Violent Behavior Description:  (Na) Does patient have access to weapons?: No Criminal Charges Pending?: No Does patient have a court date: No  Psychosis Hallucinations: None  noted Delusions: None noted  Mental Status Report Appear/Hygiene: Disheveled Eye Contact: Fair Motor Activity: Freedom of movement;Unsteady Speech: Logical/coherent Level of Consciousness: Drowsy Mood: Sullen Affect: Appropriate to circumstance Anxiety Level: Minimal Thought Processes: Coherent;Relevant Judgement: Unimpaired Orientation: Place;Person;Time;Situation Obsessive Compulsive Thoughts/Behaviors: None  Cognitive Functioning Concentration: Decreased Memory: Recent Intact;Remote Intact IQ: Average Insight: Poor Impulse Control: Poor Appetite: Fair Weight Loss:  (None noted) Weight Gain:  (None noted) Sleep: Decreased Total Hours of Sleep:  (varies) Vegetative Symptoms: None  ADLScreening [facility] Assessment Services) Patient's cognitive ability adequate to safely complete daily activities?: Yes Patient able to express need for assistance with ADLs?: Yes Independently performs ADLs?: Yes (appropriate for developmental age)  Abuse/Neglect [facility]) Physical Abuse: Denies Verbal Abuse: Denies Sexual Abuse: Denies  Prior Inpatient Therapy Prior Inpatient Therapy: No Prior Therapy Dates:  (Na) Prior Therapy Facilty/Provider(s):  (Na) Reason for Treatment:  (Na)  Prior Outpatient Therapy Prior Outpatient Therapy: No Prior Therapy Dates:  (Na) Prior Therapy Facilty/Provider(s):  (Na) Reason for Treatment:  (Na)  ADL Screening (condition at time of admission) Patient's cognitive ability adequate to safely complete daily activities?: Yes Patient able to express need for assistance with ADLs?: Yes Independently performs ADLs?: Yes (appropriate for developmental age)       Abuse/Neglect Assessment (Assessment to be complete while patient is alone) Physical Abuse: Denies Verbal Abuse: Denies Sexual Abuse: Denies Values / Beliefs Cultural Requests During Hospitalization: None Spiritual Requests During Hospitalization: None   Advance Directives (For  Healthcare) Advance Directive: Patient does not have advance directive;Patient would not like information    Additional Information 1:1 In Past 12 Months?: No CIRT Risk: No Elopement Risk: No Does patient have medical clearance?: No     Disposition:  Disposition Initial Assessment Completed for this Encounter: Yes Disposition of Patient: Other dispositions;Referred to ([facility])  On Site Evaluation by:   Reviewed with Physician:     Rudi Coco 12/10/2012 1:15 AM

## 2012-12-10 NOTE — ED Notes (Signed)
Patients father is coming in to visit and he doesn't want to be at [facility] for this weekend, is asking to be discharged and will come back at a later date.

## 2012-12-10 NOTE — ED Provider Notes (Signed)
Medical screening examination/treatment/procedure(s) were performed by non-physician practitioner and as supervising physician I was immediately available for consultation/collaboration.    Vida Roller, MD 12/10/12 1001

## 2012-12-18 ENCOUNTER — Emergency Department (HOSPITAL_COMMUNITY)
Admission: EM | Admit: 2012-12-18 | Discharge: 2012-12-18 | Disposition: A | Discharge status: Home / Self Care | Payer: BC Managed Care – PPO

## 2012-12-18 ENCOUNTER — Encounter (HOSPITAL_BASED_OUTPATIENT_CLINIC_OR_DEPARTMENT_OTHER): Payer: Self-pay | Admitting: *Deleted

## 2012-12-18 ENCOUNTER — Emergency Department (HOSPITAL_BASED_OUTPATIENT_CLINIC_OR_DEPARTMENT_OTHER)
Admission: EM | Admit: 2012-12-18 | Discharge: 2012-12-18 | Disposition: A | Discharge status: Home / Self Care | Payer: Self-pay | Attending: Emergency Medicine | Admitting: Emergency Medicine

## 2012-12-18 ENCOUNTER — Emergency Department (HOSPITAL_BASED_OUTPATIENT_CLINIC_OR_DEPARTMENT_OTHER): Payer: Self-pay

## 2012-12-18 DIAGNOSIS — Y9289 Other specified places as the place of occurrence of the external cause: Secondary | ICD-10-CM | POA: Insufficient documentation

## 2012-12-18 DIAGNOSIS — Y9389 Activity, other specified: Secondary | ICD-10-CM | POA: Insufficient documentation

## 2012-12-18 DIAGNOSIS — R296 Repeated falls: Secondary | ICD-10-CM | POA: Insufficient documentation

## 2012-12-18 DIAGNOSIS — S301XXA Contusion of abdominal wall, initial encounter: Secondary | ICD-10-CM | POA: Insufficient documentation

## 2012-12-18 DIAGNOSIS — Z8739 Personal history of other diseases of the musculoskeletal system and connective tissue: Secondary | ICD-10-CM | POA: Insufficient documentation

## 2012-12-18 DIAGNOSIS — F1010 Alcohol abuse, uncomplicated: Secondary | ICD-10-CM | POA: Insufficient documentation

## 2012-12-18 DIAGNOSIS — S301XXS Contusion of abdominal wall, sequela: Secondary | ICD-10-CM

## 2012-12-18 LAB — RAPID URINE DRUG SCREEN, HOSP PERFORMED
Amphetamines: NOT DETECTED
Cocaine: NOT DETECTED
Opiates: NOT DETECTED
Tetrahydrocannabinol: POSITIVE — AB

## 2012-12-18 LAB — CBC WITH DIFFERENTIAL/PLATELET
Basophils Absolute: 0.1 10*3/uL (ref 0.0–0.1)
Eosinophils Relative: 7 % — ABNORMAL HIGH (ref 0–5)
Lymphocytes Relative: 43 % (ref 12–46)
Lymphs Abs: 3.1 10*3/uL (ref 0.7–4.0)
MCV: 96.9 fL (ref 78.0–100.0)
Neutrophils Relative %: 30 % — ABNORMAL LOW (ref 43–77)
Platelets: 146 10*3/uL — ABNORMAL LOW (ref 150–400)
RBC: 4.86 MIL/uL (ref 4.22–5.81)
RDW: 13.7 % (ref 11.5–15.5)
WBC: 7.3 10*3/uL (ref 4.0–10.5)

## 2012-12-18 LAB — URINALYSIS, ROUTINE W REFLEX MICROSCOPIC
Leukocytes, UA: NEGATIVE
Nitrite: NEGATIVE
Protein, ur: NEGATIVE mg/dL
Specific Gravity, Urine: 1.005 (ref 1.005–1.030)
Urobilinogen, UA: 0.2 mg/dL (ref 0.0–1.0)

## 2012-12-18 LAB — COMPREHENSIVE METABOLIC PANEL
Alkaline Phosphatase: 121 U/L — ABNORMAL HIGH (ref 39–117)
BUN: 6 mg/dL (ref 6–23)
Chloride: 106 mEq/L (ref 96–112)
Creatinine, Ser: 1.1 mg/dL (ref 0.50–1.35)
GFR calc Af Amer: >90 mL/min (ref >90)
Glucose, Bld: 76 mg/dL (ref 70–99)
Potassium: 3.9 mEq/L (ref 3.5–5.1)
Total Bilirubin: 1.3 mg/dL — ABNORMAL HIGH (ref 0.3–1.2)
Total Protein: 7.1 g/dL (ref 6.0–8.3)

## 2012-12-18 LAB — ETHANOL: Alcohol, Ethyl (B): 171 mg/dL — ABNORMAL HIGH (ref 0–11)

## 2012-12-18 MED ORDER — SODIUM CHLORIDE 0.9 % IV SOLN
INTRAVENOUS | Status: DC
  Administered 2012-12-18: 02:00:00 via INTRAVENOUS

## 2012-12-18 MED ORDER — IOHEXOL 300 MG/ML  SOLN
100.0000 mL | Freq: Once | INTRAMUSCULAR | Status: AC | PRN
  Administered 2012-12-18: 100 mL via INTRAVENOUS

## 2012-12-18 NOTE — ED Provider Notes (Signed)
History     CSN: [id number]  Arrival date & time 12/18/12  0056   First MD Initiated Contact with Patient 12/18/12 0113      Chief Complaint  Patient presents with  . Abdominal Injury    (Consider location/radiation/quality/duration/timing/severity/associated sxs/prior treatment) HPI This is a 27 year old male who was cleaning a pool about a week ago and to prevent himself falling and landed with his abdomen against the wall pool. He subsequently developed a large area of ecchymosis on his left lower abdomen with a central indurated area. He was having sexual intercourse about an hour ago and the pain was exacerbated. He states the pain is now about an 8/10. He states the central indurated area, "knot", has been growing in size. He was recently released from alcohol and drug detox but admits to recent alcohol consumption ("2 beers"). He denies nausea or vomiting but states he is having difficulty passing stool.  Past Medical History  Diagnosis Date  . Back pain     Past Surgical History  Procedure Laterality Date  . Hernia repair      History reviewed. No pertinent family history.  History  Substance Use Topics  . Smoking status: Never Smoker   . Smokeless tobacco: Not on file  . Alcohol Use: Yes     Comment: 1/5 whiskey per day      Review of Systems  All other systems reviewed and are negative.    Allergies  Review of patient's allergies indicates no known allergies.  Home Medications   Current Outpatient Rx  Name  Route  Sig  Dispense  Refill  . clonazePAM (KLONOPIN) 1 MG tablet   Oral   Take 1 mg by mouth 2 (two) times daily as needed. sleep           BP 127/81  Pulse 114  Temp(Src) 98.3 F (36.8 C) (Oral)  Resp 20  Ht 6' (1.829 m)  Wt 280 lb (127.007 kg)  BMI 37.97 kg/m2  SpO2 96%  Physical Exam General: Well-developed, well-nourished male in no acute distress; appearance consistent with age of record HENT: normocephalic, atraumatic Eyes:  pupils equal round and reactive to light; extraocular muscles intact Neck: supple Heart: regular rate and rhythm Lungs: clear to auscultation bilaterally Abdomen: soft; nondistended; football-sized ecchymosis of the left lower abdomen with central tender mass; bowel sounds decreased Extremities: No deformity; full range of motion; pulses normal; trace edema of lower legs Neurologic: Awake, alert and oriented; motor function intact in all extremities and symmetric; no facial droop Skin: Warm and dry Psychiatric: Flat affect    ED Course  Procedures (including critical care time)     MDM   Nursing notes and vitals signs, including pulse oximetry, reviewed.  Summary of this visit's results, reviewed by myself:  Labs:  Results for orders placed during the hospital encounter of 12/18/12 (from the past 24 hour(s))  URINALYSIS, ROUTINE W REFLEX MICROSCOPIC     Status: None   Collection Time    12/18/12  1:13 AM      Result Value Range   Color, Urine YELLOW  YELLOW   APPearance CLEAR  CLEAR   Specific Gravity, Urine 1.005  1.005 - 1.030   pH 6.0  5.0 - 8.0   Glucose, UA NEGATIVE  NEGATIVE mg/dL   Hgb urine dipstick NEGATIVE  NEGATIVE   Bilirubin Urine NEGATIVE  NEGATIVE   Ketones, ur NEGATIVE  NEGATIVE mg/dL   Protein, ur NEGATIVE  NEGATIVE mg/dL   Urobilinogen,  UA 0.2  0.0 - 1.0 mg/dL   Nitrite NEGATIVE  NEGATIVE   Leukocytes, UA NEGATIVE  NEGATIVE  URINE RAPID DRUG SCREEN (HOSP PERFORMED)     Status: Abnormal   Collection Time    12/18/12  1:24 AM      Result Value Range   Opiates NONE DETECTED  NONE DETECTED   Cocaine NONE DETECTED  NONE DETECTED   Benzodiazepines NONE DETECTED  NONE DETECTED   Amphetamines NONE DETECTED  NONE DETECTED   Tetrahydrocannabinol POSITIVE (*) NONE DETECTED   Barbiturates NONE DETECTED  NONE DETECTED  CBC WITH DIFFERENTIAL     Status: Abnormal   Collection Time    12/18/12  1:43 AM      Result Value Range   WBC 7.3  4.0 - 10.5 K/uL   RBC  4.86  4.22 - 5.81 MIL/uL   Hemoglobin 16.8  13.0 - 17.0 g/dL   HCT 16.1  09.6 - 04.5 %   MCV 96.9  78.0 - 100.0 fL   MCH 34.6 (*) 26.0 - 34.0 pg   MCHC 35.7  30.0 - 36.0 g/dL   RDW 40.9  81.1 - 91.4 %   Platelets 146 (*) 150 - 400 K/uL   Neutrophils Relative % 30 (*) 43 - 77 %   Neutro Abs 2.2  1.7 - 7.7 K/uL   Lymphocytes Relative 43  12 - 46 %   Lymphs Abs 3.1  0.7 - 4.0 K/uL   Monocytes Relative 19 (*) 3 - 12 %   Monocytes Absolute 1.4 (*) 0.1 - 1.0 K/uL   Eosinophils Relative 7 (*) 0 - 5 %   Eosinophils Absolute 0.5  0.0 - 0.7 K/uL   Basophils Relative 1  0 - 1 %   Basophils Absolute 0.1  0.0 - 0.1 K/uL  COMPREHENSIVE METABOLIC PANEL     Status: Abnormal   Collection Time    12/18/12  1:43 AM      Result Value Range   Sodium 141  135 - 145 mEq/L   Potassium 3.9  3.5 - 5.1 mEq/L   Chloride 106  96 - 112 mEq/L   CO2 26  19 - 32 mEq/L   Glucose, Bld 76  70 - 99 mg/dL   BUN 6  6 - 23 mg/dL   Creatinine, Ser 7.82  0.50 - 1.35 mg/dL   Calcium 9.6  8.4 - 95.6 mg/dL   Total Protein 7.1  6.0 - 8.3 g/dL   Albumin 3.4 (*) 3.5 - 5.2 g/dL   AST 213 (*) 0 - 37 U/L   ALT 67 (*) 0 - 53 U/L   Alkaline Phosphatase 121 (*) 39 - 117 U/L   Total Bilirubin 1.3 (*) 0.3 - 1.2 mg/dL   GFR calc non Af Amer >90  >90 mL/min   GFR calc Af Amer >90  >90 mL/min  ETHANOL     Status: Abnormal   Collection Time    12/18/12  1:43 AM      Result Value Range   Alcohol, Ethyl (B) 171 (*) 0 - 11 mg/dL    Imaging Studies: Ct Abdomen Pelvis W Contrast  12/18/2012   *RADIOLOGY REPORT*  Clinical Data: Abdominal injury, fell striking the abdomen, bruising and knot at left lower quadrant, pain, fell into a pool while cleaning it  CT ABDOMEN AND PELVIS WITH CONTRAST  Technique:  Multidetector CT imaging of the abdomen and pelvis was performed following the standard protocol during bolus administration of intravenous contrast.  Sagittal and coronal MPR images reconstructed from axial data set.  Contrast:  OMNIPAQUE IOHEXOL 300 MG/ML  SOLN No oral contrast administered.  Comparison: None  Findings: Lung bases clear. Marked fatty infiltration of liver. No focal abnormalities of the liver, spleen, pancreas, kidneys, or adrenal glands. Retroaortic left renal vein. Spontaneous infrahepatic portocaval shunt. Stomach and bowel loops normal appearance.  Normal appendix. Unremarkable bladder and ureters. No intra-abdominal mass, adenopathy, or free fluid. Subcutaneous contusion left lower quadrant with small central hematoma 2.6 cm diameter, associated with scattered stranding of surrounding subcutaneous fat. No acute osseous findings.  IMPRESSION: No acute intra abdominal or intrapelvic abnormalities. Subcutaneous contusion and small hematoma in left lower quadrant abdominal wall. Marked fatty infiltration of liver. Spontaneous portacaval shunt.   Original Report Authenticated By: Ulyses Southward, M.D.   2:53 AM Patient advised to CT findings. Patient was also advised that he was legally intoxicated and it was inappropriate for him to be driving. He states that detox has taught him to regulate" his drinking and he again insists he only had 2 beers         Hanley Seamen, MD 12/18/12 [phone]

## 2012-12-18 NOTE — ED Notes (Signed)
Transported to [place] 

## 2012-12-18 NOTE — ED Notes (Signed)
Pt states he "fell into a pool while cleaning it" about a week ago "after getting out of detox". Large area of bruising and a knot to LLQ. Admits to drinking 2 pints of beer and taking 2 Klonopin. "Didn't flare up until he and his girlfriend had intercourse"

## 2012-12-18 NOTE — ED Notes (Signed)
Returned from CT.

## 2013-01-03 ENCOUNTER — Encounter (HOSPITAL_COMMUNITY): Payer: Self-pay | Admitting: *Deleted

## 2013-01-03 ENCOUNTER — Emergency Department (HOSPITAL_COMMUNITY)
Admission: EM | Admit: 2013-01-03 | Discharge: 2013-01-04 | Disposition: A | Discharge status: Home / Self Care | Payer: BC Managed Care – PPO | Attending: Emergency Medicine | Admitting: Emergency Medicine

## 2013-01-03 DIAGNOSIS — F3112 Bipolar disorder, current episode manic without psychotic features, moderate: Secondary | ICD-10-CM

## 2013-01-03 DIAGNOSIS — F1210 Cannabis abuse, uncomplicated: Secondary | ICD-10-CM | POA: Insufficient documentation

## 2013-01-03 DIAGNOSIS — Z8659 Personal history of other mental and behavioral disorders: Secondary | ICD-10-CM | POA: Insufficient documentation

## 2013-01-03 DIAGNOSIS — F1910 Other psychoactive substance abuse, uncomplicated: Secondary | ICD-10-CM

## 2013-01-03 DIAGNOSIS — IMO0002 Reserved for concepts with insufficient information to code with codable children: Secondary | ICD-10-CM

## 2013-01-03 DIAGNOSIS — F1010 Alcohol abuse, uncomplicated: Secondary | ICD-10-CM | POA: Insufficient documentation

## 2013-01-03 DIAGNOSIS — F919 Conduct disorder, unspecified: Secondary | ICD-10-CM | POA: Insufficient documentation

## 2013-01-03 HISTORY — DX: Mental disorder, not otherwise specified: F99

## 2013-01-03 HISTORY — DX: Anxiety disorder, unspecified: F41.9

## 2013-01-03 LAB — RAPID URINE DRUG SCREEN, HOSP PERFORMED
Amphetamines: NOT DETECTED
Barbiturates: NOT DETECTED
Benzodiazepines: POSITIVE — AB
Tetrahydrocannabinol: POSITIVE — AB

## 2013-01-03 LAB — CBC
MCHC: 35.4 g/dL (ref 30.0–36.0)
Platelets: 191 10*3/uL (ref 150–400)
RDW: 13.5 % (ref 11.5–15.5)
WBC: 7.4 10*3/uL (ref 4.0–10.5)

## 2013-01-03 LAB — COMPREHENSIVE METABOLIC PANEL
ALT: 73 U/L — ABNORMAL HIGH (ref 0–53)
AST: 149 U/L — ABNORMAL HIGH (ref 0–37)
Albumin: 3.5 g/dL (ref 3.5–5.2)
Alkaline Phosphatase: 119 U/L — ABNORMAL HIGH (ref 39–117)
Chloride: 104 mEq/L (ref 96–112)
Potassium: 3.9 mEq/L (ref 3.5–5.1)
Sodium: 139 mEq/L (ref 135–145)
Total Protein: 7.5 g/dL (ref 6.0–8.3)

## 2013-01-03 LAB — ETHANOL: Alcohol, Ethyl (B): <11 mg/dL (ref 0–11)

## 2013-01-03 MED ORDER — LORAZEPAM 1 MG PO TABS
1.0000 mg | ORAL_TABLET | Freq: Three times a day (TID) | ORAL | Status: DC | PRN
  Administered 2013-01-03: 1 mg via ORAL
  Filled 2013-01-03: qty 1

## 2013-01-03 MED ORDER — ZOLPIDEM TARTRATE 5 MG PO TABS: 5.0000 mg | ORAL_TABLET | Freq: Every evening | ORAL | Status: DC | PRN

## 2013-01-03 MED ORDER — NICOTINE 21 MG/24HR TD PT24
21.0000 mg | MEDICATED_PATCH | Freq: Every day | TRANSDERMAL | Status: DC
  Administered 2013-01-04: 21 mg via TRANSDERMAL
  Filled 2013-01-03: qty 1

## 2013-01-03 MED ORDER — IBUPROFEN 200 MG PO TABS: 600.0000 mg | ORAL_TABLET | Freq: Three times a day (TID) | ORAL | Status: DC | PRN

## 2013-01-03 MED ORDER — ONDANSETRON HCL 4 MG PO TABS: 4.0000 mg | ORAL_TABLET | Freq: Three times a day (TID) | ORAL | Status: DC | PRN

## 2013-01-03 MED ORDER — ACETAMINOPHEN 325 MG PO TABS: 650.0000 mg | ORAL_TABLET | ORAL | Status: DC | PRN

## 2013-01-03 NOTE — ED Notes (Signed)
Patient is awake and alert, denies HI,SI, AH or VH. Pt is cooperative and pleasant. Patient c/o increased anxiety.(6/10)  Interacts well with staff and others. Encouraged and support offered.  Will continue to monitor for safety.Q15 min check.

## 2013-01-03 NOTE — ED Provider Notes (Signed)
   History    CSN: [id number] Arrival date & time 01/03/13  1308  First MD Initiated Contact with Patient 01/03/13 1829     Chief Complaint  Patient presents with  . Medical Clearance   (Consider location/radiation/quality/duration/timing/severity/associated sxs/prior Treatment) HPI  Patient is a 27 year old male past medical history significant for anxiety, bipolar, presented to the emergency department or hospital and aggressive behavior for referral to this department. [employer] tract with IVC paperwork for patient. According to [employer] and IVC paperwork patient has not been taking his medications for bipolar depression and he has also been using marijuana and alcohol. Patient denies any suicidal or homicidal ideations at this time. He denies any auditory or visual hallucinations. He also denies any alcohol or recreational drug use. Patient has no physical complaints at this time.  Past Medical History  Diagnosis Date  . Back pain   . Anxiety    Past Surgical History  Procedure Laterality Date  . Hernia repair     History reviewed. No pertinent family history. History  Substance Use Topics  . Smoking status: Never Smoker   . Smokeless tobacco: Not on file  . Alcohol Use: Yes     Comment: 1/5 whiskey per day    Review of Systems  Constitutional: Negative for fever and chills.  Respiratory: Negative for shortness of breath.   Cardiovascular: Negative for chest pain.  Gastrointestinal: Negative for vomiting and diarrhea.  Psychiatric/Behavioral: Negative for suicidal ideas and self-injury.  All other systems reviewed and are negative.    Allergies  Morphine and related  Home Medications  No current outpatient prescriptions on file. BP 138/99  Pulse 98  Temp(Src) 98.3 F (36.8 C) (Oral)  Resp 18  SpO2 97% Physical Exam  Constitutional: He is oriented to person, place, and time. He appears well-developed and well-nourished. No distress.  HENT:  Head: Normocephalic and  atraumatic.  Eyes: Conjunctivae are normal.  Neck: Neck supple.  Cardiovascular: Normal rate, regular rhythm and normal heart sounds.   Pulmonary/Chest: Effort normal and breath sounds normal.  Abdominal: Soft.  Neurological: He is alert and oriented to person, place, and time.  Skin: Skin is warm and dry. He is not diaphoretic.  Psychiatric: He has a normal mood and affect.    ED Course  Procedures (including critical care time) Labs Reviewed  COMPREHENSIVE METABOLIC PANEL - Abnormal; Notable for the following:    Glucose, Bld 100 (*)    BUN 5 (*)    AST 149 (*)    ALT 73 (*)    Alkaline Phosphatase 119 (*)    Total Bilirubin 2.7 (*)    All other components within normal limits  URINE RAPID DRUG SCREEN (HOSP PERFORMED) - Abnormal; Notable for the following:    Benzodiazepines POSITIVE (*)    Tetrahydrocannabinol POSITIVE (*)    All other components within normal limits  CBC  ETHANOL   No results found. 1. Behavior problem     MDM  Patient presenting for aggressive and hostile behavior with [facility] with IVC paperwork. According to the IVC paperwork patient has not been taking his medications and has been abusing marijuana and alcohol. Labs reviewed. ACT team consulted. Psych hold orders placed. Patient will be moved to Psych ED for further evaluation.   Lise Auer Dynastie Knoop, PA-C 01/03/13 2000

## 2013-01-03 NOTE — ED Provider Notes (Signed)
  Medical screening examination/treatment/procedure(s) were performed by non-physician practitioner and as supervising physician I was immediately available for consultation/collaboration.    Gerhard Munch, MD 01/03/13 2350

## 2013-01-03 NOTE — ED Notes (Signed)
Patient has one bag of belongings in locker 26. 

## 2013-01-03 NOTE — ED Notes (Signed)
Pt arrives by [employer] with IVC papers. Per paperwork pt is hostile and aggressive, has bipolar and depression and has not been taking medication. Abusing marijuana and alcohol. Paperwork also reports pt is SI.

## 2013-01-04 ENCOUNTER — Encounter (HOSPITAL_COMMUNITY): Payer: Self-pay | Admitting: *Deleted

## 2013-01-04 DIAGNOSIS — IMO0002 Reserved for concepts with insufficient information to code with codable children: Secondary | ICD-10-CM

## 2013-01-04 DIAGNOSIS — F1910 Other psychoactive substance abuse, uncomplicated: Secondary | ICD-10-CM

## 2013-01-04 MED ORDER — HYDROXYZINE HCL 25 MG PO TABS
25.0000 mg | ORAL_TABLET | Freq: Four times a day (QID) | ORAL | Status: DC | PRN
  Administered 2013-01-04: 25 mg via ORAL
  Filled 2013-01-04: qty 14

## 2013-01-04 MED ORDER — TRAZODONE HCL 50 MG PO TABS
50.0000 mg | ORAL_TABLET | Freq: Every evening | ORAL | Status: DC | PRN
  Administered 2013-01-04: 50 mg via ORAL
  Filled 2013-01-04: qty 1

## 2013-01-04 NOTE — ED Provider Notes (Signed)
Psych team evaluated the patient. Plan is to discharge them per their recommendations - see below   Plan  Patient does not meet criteria for inpatient psychiatric services at this time. Recommend to discharge patient home with his father. Recommend to followup with his primary care physician for medication management. Recommend social worker to help outpatient support services and psychiatric care. Discuss safety plan with the patient that anytime having active suicidal thoughts or homicidal thoughts and he to call 911 or go to local emergency room.  elected to discharge.           Derwood Kaplan, MD 01/04/13 1122

## 2013-01-04 NOTE — Progress Notes (Signed)
[facility] did not get to see patient but will be sending information to him about the [facility], using the address provided.

## 2013-01-04 NOTE — BH Assessment (Signed)
Assessment Note   Larry Adams is a 26 y.o. male who presents via IVC petition initiated by his mother. Pt brought in for psych evaluation due to hostile and aggressive behavior.  Per petition, pt has been destroying property in the home and has made several statements that he is SI w/no plan to harm himself.  Reportedly, pt is paranoid, refusing to leave his room and constantly believes that people are talking about him.  During the interview, pt adamantly denies these allegations--denies SI/HI/AVH, stating that he was arrested on 01/02/13 for an assault charge 23yrs ago.  Pt says he doesn't remember the details of the charge.    Pt says he contacted his mother to pick him up from jail after his release and she didn't want to get him out. He says that she posted bond but initiated IVC petition before he was released and he was picked and brought to hospital  Pt says that his mother lives in [place] and initiated papers while still in [place].  Explained to patient that this is incorrect because mother's signature is on papers.  Pt says he and his mother have always had difficult relationship.  Pt has inpt admission with [facility] in 2005 and currently engages in outpatient services with [facility] for med mgt in [place] last appt was 2 wks ago.  Pt has no therapist.  Pt is prescribed Zoloft and Xanax and says he takes when medication when needed, citing the last time he took his medications was 01/02/13 because he was locked up.  Due to pt.'s denial of petition allegations, Donell Sievert, PA is requesting psych eval by on-coming psychiatrist to complete disposition and rescind IVC papers if necessary.      Axis I: Depressive Disorder NOS Axis II: Deferred Axis III:  Past Medical History  Diagnosis Date  . Back pain   . Anxiety   . Mental disorder    Axis IV: other psychosocial or environmental problems, problems related to legal system/crime, problems related to social environment and  problems with primary support group Axis V: 51-60 moderate symptoms  Past Medical History:  Past Medical History  Diagnosis Date  . Back pain   . Anxiety   . Mental disorder     Past Surgical History  Procedure Laterality Date  . Hernia repair      Family History: History reviewed. No pertinent family history.  Social History:  reports that he has never smoked. He does not have any smokeless tobacco history on file. He reports that  drinks alcohol. He reports that he uses illicit drugs (Marijuana).  Additional Social History:  Alcohol / Drug Use Pain Medications: None  Prescriptions: Zoloft, Xanax  Over the Counter: None  History of alcohol / drug use?: Yes Longest period of sobriety (when/how long): None  Negative Consequences of Use: Work / School;Legal;Financial;Personal relationships Withdrawal Symptoms: Other (Comment) (No current w/d sxs ) Substance #1 Name of Substance 1: THC  1 - Age of First Use: Teens  1 - Amount (size/oz): 1 Joint  1 - Frequency: Wkly  1 - Duration: On-going  1 - Last Use / Amount: 01/02/13  CIWA: CIWA-Ar BP: 109/66 mmHg Pulse Rate: 80 COWS:    Allergies:  Allergies  Allergen Reactions  . Morphine And Related Hives    Home Medications:  (Not in a hospital admission)  OB/GYN Status:  No LMP for male patient.  General Assessment Data Location of Assessment: [facility] Living Arrangements: Parent (Lives with father ) Can  pt return to current living arrangement?: Yes Admission Status: Involuntary Is patient capable of signing voluntary admission?: No Transfer from: Acute Hospital Referral Source: MD  Education Status Is patient currently in school?: No Current Grade: None  Highest grade of school patient has completed: None  Name of school: None  Contact person: None   Risk to self Suicidal Ideation: No-Not Currently/Within Last 6 Months Suicidal Intent: No-Not Currently/Within Last 6 Months Is patient at risk for suicide?:  No Suicidal Plan?: No-Not Currently/Within Last 6 Months Access to Means: No What has been your use of drugs/alcohol within the last 12 months?: Abusing: THC  Previous Attempts/Gestures: No How many times?: 0 Other Self Harm Risks: None  Triggers for Past Attempts: None known Intentional Self Injurious Behavior: None Family Suicide History: No Recent stressful life event(s): Conflict (Comment) (Relational issues w/mother; Recent arrest on 01/02/13 ) Persecutory voices/beliefs?: No Depression: No Depression Symptoms:  (None reported ) Substance abuse history and/or treatment for substance abuse?: Yes Suicide prevention information given to non-admitted patients: Not applicable  Risk to Others Homicidal Ideation: No Thoughts of Harm to Others: No Current Homicidal Intent: No Current Homicidal Plan: No Access to Homicidal Means: No Identified Victim: None  History of harm to others?: No Assessment of Violence: None Noted Violent Behavior Description: None  Does patient have access to weapons?: No Criminal Charges Pending?: No Does patient have a court date: No  Psychosis Hallucinations: None noted Delusions: None noted  Mental Status Report Appear/Hygiene: Disheveled;Poor hygiene Eye Contact: Fair Motor Activity: Unremarkable Speech: Logical/coherent Level of Consciousness: Alert Mood: Sullen Affect: Sullen Anxiety Level: None Thought Processes: Coherent;Relevant Judgement: Unimpaired Orientation: Person;Place;Time;Situation Obsessive Compulsive Thoughts/Behaviors: None  Cognitive Functioning Concentration: Normal Memory: Recent Intact;Remote Intact IQ: Average Insight: Fair Impulse Control: Fair Appetite: Good Weight Loss: 0 Weight Gain: 0 Sleep: Decreased Total Hours of Sleep:  (Varies ) Vegetative Symptoms: None  ADLScreening [facility]) Patient's cognitive ability adequate to safely complete daily activities?: Yes Patient able to express  need for assistance with ADLs?: Yes Independently performs ADLs?: Yes (appropriate for developmental age)  Abuse/Neglect [facility]) Physical Abuse: Denies Verbal Abuse: Denies Sexual Abuse: Denies  Prior Inpatient Therapy Prior Inpatient Therapy: No Prior Therapy Dates: None  Prior Therapy Facilty/Provider(s): None  Reason for Treatment: None   Prior Outpatient Therapy Prior Outpatient Therapy: Yes Prior Therapy Dates: Current  Prior Therapy Facilty/Provider(s): [facility]  Reason for Treatment: Med Mgt   ADL Screening (condition at time of admission) Patient's cognitive ability adequate to safely complete daily activities?: Yes Is the patient deaf or have difficulty hearing?: No Does the patient have difficulty seeing, even when wearing glasses/contacts?: No Does the patient have difficulty concentrating, remembering, or making decisions?: No Patient able to express need for assistance with ADLs?: Yes Does the patient have difficulty dressing or bathing?: No Independently performs ADLs?: Yes (appropriate for developmental age) Does the patient have difficulty walking or climbing stairs?: No Weakness of Legs: None Weakness of Arms/Hands: None  Home Assistive Devices/Equipment Home Assistive Devices/Equipment: None  Therapy Consults (therapy consults require a physician order) PT Evaluation Needed: No OT Evalulation Needed: No SLP Evaluation Needed: No Abuse/Neglect Assessment (Assessment to be complete while patient is alone) Physical Abuse: Denies Verbal Abuse: Denies Sexual Abuse: Denies Exploitation of patient/patient's resources: Denies Self-Neglect: Denies Values / Beliefs Cultural Requests During Hospitalization: None Spiritual Requests During Hospitalization: None Consults Spiritual Care Consult Needed: No Social Work Consult Needed: No Merchant navy officer (For Healthcare) Advance Directive: Patient does not  have advance directive;Patient would not like  information Pre-existing out of facility DNR order (yellow form or pink MOST form): No Nutrition Screen- [facility] Patient's home diet: Regular  Additional Information 1:1 In Past 12 Months?: No CIRT Risk: No Elopement Risk: No Does patient have medical clearance?: Yes     Disposition:  Disposition Initial Assessment Completed for this Encounter: Yes Disposition of Patient: Other dispositions (Pending AM psych eval ) Other disposition(s): Other (Comment) (Pending am psych eval )  On Site Evaluation by:   Reviewed with Physician:     Murrell Redden 01/04/2013 4:36 AM

## 2013-01-04 NOTE — Consult Note (Signed)
Reason for Consult:Eval for IP psychiatric care Referring Physician: Jeraldine Loots MD  Larry Adams is an 27 y.o. male.  HPI: Pt is a 27 y/o well nourished Wm with reported h/o of bipolar d/o admitted to [facility] ED under IVC orders by his mother due to aggressive behaviors, destruction of personal property, medical non compliance, reported selling of Rx medications, substance abuse and SI without reported plan. The patient whom was recently incarcerated denies the above findings and states he's been compliant with his Rx medications, denies SI/SA/HI or AVH and can contract for safety at this present time.  Past Medical History  Diagnosis Date  . Back pain   . Anxiety     Past Surgical History  Procedure Laterality Date  . Hernia repair      History reviewed. No pertinent family history.  Social History:  reports that he has never smoked. He does not have any smokeless tobacco history on file. He reports that  drinks alcohol. He reports that he uses illicit drugs (Marijuana).  Allergies:  Allergies  Allergen Reactions  . Morphine And Related Hives    Medications: I have reviewed the patient's current medications.  Results for orders placed during the hospital encounter of 01/03/13 (from the past 48 hour(s))  CBC     Status: None   Collection Time    01/03/13  6:40 PM      Result Value Range   WBC 7.4  4.0 - 10.5 K/uL   RBC 4.84  4.22 - 5.81 MIL/uL   Hemoglobin 16.3  13.0 - 17.0 g/dL   HCT 29.5  62.1 - 30.8 %   MCV 95.2  78.0 - 100.0 fL   MCH 33.7  26.0 - 34.0 pg   MCHC 35.4  30.0 - 36.0 g/dL   RDW 65.7  84.6 - 96.2 %   Platelets 191  150 - 400 K/uL  COMPREHENSIVE METABOLIC PANEL     Status: Abnormal   Collection Time    01/03/13  6:40 PM      Result Value Range   Sodium 139  135 - 145 mEq/L   Potassium 3.9  3.5 - 5.1 mEq/L   Chloride 104  96 - 112 mEq/L   CO2 22  19 - 32 mEq/L   Glucose, Bld 100 (*) 70 - 99 mg/dL   BUN 5 (*) 6 - 23 mg/dL   Creatinine, Ser 9.52  0.50 -  1.35 mg/dL   Calcium 9.3  8.4 - 84.1 mg/dL   Total Protein 7.5  6.0 - 8.3 g/dL   Albumin 3.5  3.5 - 5.2 g/dL   AST 324 (*) 0 - 37 U/L   ALT 73 (*) 0 - 53 U/L   Alkaline Phosphatase 119 (*) 39 - 117 U/L   Total Bilirubin 2.7 (*) 0.3 - 1.2 mg/dL   GFR calc non Af Amer >90  >90 mL/min   GFR calc Af Amer >90  >90 mL/min   Comment:            The eGFR has been calculated     using the CKD EPI equation.     This calculation has not been     validated in all clinical     situations.     eGFR's persistently     <90 mL/min signify     possible Chronic Kidney Disease.  ETHANOL     Status: None   Collection Time    01/03/13  6:40 PM  Result Value Range   Alcohol, Ethyl (B) <11  0 - 11 mg/dL   Comment:            LOWEST DETECTABLE LIMIT FOR     SERUM ALCOHOL IS 11 mg/dL     FOR MEDICAL PURPOSES ONLY  URINE RAPID DRUG SCREEN (HOSP PERFORMED)     Status: Abnormal   Collection Time    01/03/13  6:42 PM      Result Value Range   Opiates NONE DETECTED  NONE DETECTED   Cocaine NONE DETECTED  NONE DETECTED   Benzodiazepines POSITIVE (*) NONE DETECTED   Amphetamines NONE DETECTED  NONE DETECTED   Tetrahydrocannabinol POSITIVE (*) NONE DETECTED   Barbiturates NONE DETECTED  NONE DETECTED   Comment:            DRUG SCREEN FOR MEDICAL PURPOSES     ONLY.  IF CONFIRMATION IS NEEDED     FOR ANY PURPOSE, NOTIFY LAB     WITHIN 5 DAYS.                LOWEST DETECTABLE LIMITS     FOR URINE DRUG SCREEN     Drug Class       Cutoff (ng/mL)     Amphetamine      1000     Barbiturate      200     Benzodiazepine   200     Tricyclics       300     Opiates          300     Cocaine          300     THC              50    No results found.  Review of Systems  Psychiatric/Behavioral: Positive for substance abuse. Negative for depression, suicidal ideas, hallucinations and memory loss. The patient is not nervous/anxious and does not have insomnia.        Patient denies SI/HI/AVH and can contract  for safety. Pt denies all the finding contained within the IVC  All other systems reviewed and are negative.   Blood pressure 120/76, pulse 78, temperature 99.7 F (37.6 C), temperature source Oral, resp. rate 16, SpO2 95.00%. Physical Exam  Nursing note and vitals reviewed. Constitutional: He is oriented to person, place, and time. He appears well-developed and well-nourished.  HENT:  Head: Normocephalic.  Eyes: Pupils are equal, round, and reactive to light.  Neck: Neck supple. No thyromegaly present.  Cardiovascular: Normal rate.   Respiratory: Breath sounds normal.  GI: Bowel sounds are normal.  Neurological: He is alert and oriented to person, place, and time.  Skin: Skin is warm and dry.  Psychiatric:  Patient awake, alert and appropriate with proper affect. Good eye contact with normal speech tone and pattern. No flight of ideas and patient with good insight.    Assessment/Plan: 1) Patient not meeting criteria for IP psychiatric admission for crises mgmt, safety and stabilization of bipolar d/o 2) Recommend Psychiatrist in am to D/c the patient home after resending the IVC 3) Social work to aid in OP support services and psychiatric care 4) Mgmt of applicable co-morbid conditions  LarrySPENCER Adams 01/04/2013, 1:45 AM     Addendum Patient seen today.  Patient had a good night.  Patient told he is not aware why his mother committed him.  Patient denies any aggressive behavior.  He admitted to smoking pot denies any drug  use.  Patient told 3 weeks ago he had a detox treatment from [facility].  Patient told he was release from the jail after 20 hours.  He did not know why he was arrested however he is released on bond.  He has a court date next week.  Patient lives with his father.  Patient is a Naval architect.  Patient endorse that he was taking  Klonopin and Xanax from Dr. Lemont Fillers in [place].  He moved to [place] 2 months ago to live with his father.  Patient was living with  his grandmother in [place] and working but did not like the job.  Patient is to see his primary care physician in [place] for medication management.  Patient denies any previous history of suicidal attempt or any homicidal intent.  He denies any history of psychosis in the past.  Patient like to be discharged.  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    Mental status examination. Patient appears to be in his his stated age.  He is mildly obese.  He denies any active or passive suicidal thoughts or homicidal thoughts.  He denies any auditory or visual hallucination.  There no psychotic symptoms present at this time.  His psychomotor activity is normal.  He's anxious and he described his mood is neutral.  There were no paranoia or delusion obsession present at this time.  He is calm cooperative.  He's alert and oriented x3.  Plan Patient does not meet criteria for inpatient psychiatric services at this time.  Recommend to discharge patient home with his father.  Recommend to followup with his primary care physician for medication management.  Recommend social worker to help outpatient support services and psychiatric care.  Discuss safety plan with the patient that anytime having active suicidal thoughts or homicidal thoughts and he to call 911 or go to local emergency room. elected to discharge.

## 2013-01-10 ENCOUNTER — Emergency Department (HOSPITAL_COMMUNITY)
Admission: EM | Admit: 2013-01-10 | Discharge: 2013-01-10 | Disposition: A | Discharge status: Home / Self Care | Payer: Self-pay | Attending: Emergency Medicine | Admitting: Emergency Medicine

## 2013-01-10 ENCOUNTER — Encounter (HOSPITAL_COMMUNITY): Payer: Self-pay

## 2013-01-10 DIAGNOSIS — M25512 Pain in left shoulder: Secondary | ICD-10-CM

## 2013-01-10 DIAGNOSIS — M25519 Pain in unspecified shoulder: Secondary | ICD-10-CM | POA: Insufficient documentation

## 2013-01-10 DIAGNOSIS — G8929 Other chronic pain: Secondary | ICD-10-CM | POA: Insufficient documentation

## 2013-01-10 DIAGNOSIS — F411 Generalized anxiety disorder: Secondary | ICD-10-CM | POA: Insufficient documentation

## 2013-01-10 DIAGNOSIS — F419 Anxiety disorder, unspecified: Secondary | ICD-10-CM

## 2013-01-10 DIAGNOSIS — R51 Headache: Secondary | ICD-10-CM | POA: Insufficient documentation

## 2013-01-10 MED ORDER — IBUPROFEN 600 MG PO TABS: 600.0000 mg | ORAL_TABLET | Freq: Four times a day (QID) | ORAL | Status: DC | PRN

## 2013-01-10 MED ORDER — LORAZEPAM 1 MG PO TABS
1.0000 mg | ORAL_TABLET | Freq: Once | ORAL | Status: AC
Start: 2013-01-10 — End: 2013-01-10
  Administered 2013-01-10: 1 mg via ORAL
  Filled 2013-01-10: qty 1

## 2013-01-10 MED ORDER — KETOROLAC TROMETHAMINE 30 MG/ML IJ SOLN
30.0000 mg | Freq: Once | INTRAMUSCULAR | Status: AC
  Administered 2013-01-10: 30 mg via INTRAMUSCULAR
  Filled 2013-01-10: qty 1

## 2013-01-10 MED ORDER — LORAZEPAM 1 MG PO TABS: 1.0000 mg | ORAL_TABLET | Freq: Three times a day (TID) | ORAL | Status: DC | PRN

## 2013-01-10 NOTE — ED Notes (Signed)
Pt states that he woke up with a headache and extreme anxiety, he also states that he's having left shoulder pain, that has been dislocated in the past, he thinks he was laying on it wrong

## 2013-01-10 NOTE — ED Provider Notes (Signed)
Medical screening examination/treatment/procedure(s) were performed by non-physician practitioner and as supervising physician I was immediately available for consultation/collaboration.  Sunnie Nielsen, MD 01/10/13 [phone]

## 2013-01-10 NOTE — ED Provider Notes (Signed)
History    CSN: [id number] Arrival date & time 01/10/13  0228  First MD Initiated Contact with Patient 01/10/13 0321     Chief Complaint  Patient presents with  . Shoulder Pain  . Headache  . Anxiety   (Consider location/radiation/quality/duration/timing/severity/associated sxs/prior Treatment) HPI Comments: Since days to get him around midnight went to bed woke up approximately 30 minutes.  Prior to arrival with global headache, and anxiety.  He also states, that his shoulder has been hurting him.  This is an old injury from playing ice hockey, wrist, and dislocated.  Many times.  He has seen a physician in [place] for his anxiety and he normally takes Xanax.  He is currently been out of this medication for approximately one week.  He is reestablishing residents in [place], and hopes to reestablish with his former physician.  In your future.  He denies any photophobia, and nausea.  Trauma  Patient is a 27 y.o. male presenting with shoulder pain, headaches, and anxiety. The history is provided by the patient.  Shoulder Pain This is a recurrent problem. The current episode started today. The problem has been unchanged. Associated symptoms include headaches. Pertinent negatives include no chills, coughing, fever, joint swelling, neck pain or rash. Nothing aggravates the symptoms. He has tried nothing for the symptoms.  Headache Associated symptoms: no cough, no dizziness, no fever, no neck pain and no neck stiffness   Anxiety Associated symptoms include headaches. Pertinent negatives include no chills, coughing, fever, joint swelling, neck pain or rash.   Past Medical History  Diagnosis Date  . Back pain   . Anxiety   . Mental disorder    Past Surgical History  Procedure Laterality Date  . Hernia repair     History reviewed. No pertinent family history. History  Substance Use Topics  . Smoking status: Never Smoker   . Smokeless tobacco: Not on file  . Alcohol Use: Yes      Comment: 1/5 whiskey per day    Review of Systems  Constitutional: Negative for fever and chills.  HENT: Negative for neck pain and neck stiffness.   Respiratory: Negative for cough.   Musculoskeletal: Negative for joint swelling.  Skin: Negative for rash and wound.  Neurological: Positive for headaches. Negative for dizziness.  All other systems reviewed and are negative.    Allergies  Morphine and related  Home Medications   Current Outpatient Rx  Name  Route  Sig  Dispense  Refill  . ALPRAZolam (XANAX) 1 MG tablet   Oral   Take 1 mg by mouth 3 (three) times daily as needed for anxiety.         Marland Kitchen ibuprofen (ADVIL,MOTRIN) 600 MG tablet   Oral   Take 1 tablet (600 mg total) by mouth every 6 (six) hours as needed for pain.   30 tablet   0   . LORazepam (ATIVAN) 1 MG tablet   Oral   Take 1 tablet (1 mg total) by mouth every 8 (eight) hours as needed for anxiety.   12 tablet   0    BP 135/85  Pulse 101  Temp(Src) 98.9 F (37.2 C) (Oral)  Resp 20  Ht 6' (1.829 m)  Wt 280 lb (127.007 kg)  BMI 37.97 kg/m2  SpO2 94% Physical Exam  Nursing note and vitals reviewed. Constitutional: He is oriented to person, place, and time. He appears well-nourished.  HENT:  Head: Normocephalic.  Eyes: Pupils are equal, round, and reactive to  light.  Neck: Normal range of motion.  Cardiovascular: Normal rate and regular rhythm.   Pulmonary/Chest: Effort normal and breath sounds normal.  Musculoskeletal: He exhibits no edema and no tenderness.       Left shoulder: He exhibits crepitus and pain. He exhibits normal range of motion, no swelling, no deformity and normal pulse.       Arms: Neurological: He is alert and oriented to person, place, and time.  Skin: Skin is warm.    ED Course  Procedures (including critical care time) Labs Reviewed - No data to display No results found. 1. Headache   2. Anxiety   3. Chronic shoulder pain, left     MDM  Since being  medicated patient has been sleeping   Arman Filter, NP 01/10/13 [phone]

## 2013-01-31 ENCOUNTER — Emergency Department (HOSPITAL_BASED_OUTPATIENT_CLINIC_OR_DEPARTMENT_OTHER)
Admission: EM | Admit: 2013-01-31 | Discharge: 2013-01-31 | Disposition: A | Discharge status: Home / Self Care | Payer: Self-pay | Attending: Emergency Medicine | Admitting: Emergency Medicine

## 2013-01-31 ENCOUNTER — Encounter (HOSPITAL_BASED_OUTPATIENT_CLINIC_OR_DEPARTMENT_OTHER): Payer: Self-pay | Admitting: *Deleted

## 2013-01-31 DIAGNOSIS — F411 Generalized anxiety disorder: Secondary | ICD-10-CM | POA: Insufficient documentation

## 2013-01-31 DIAGNOSIS — T40605A Adverse effect of unspecified narcotics, initial encounter: Secondary | ICD-10-CM | POA: Insufficient documentation

## 2013-01-31 DIAGNOSIS — F17200 Nicotine dependence, unspecified, uncomplicated: Secondary | ICD-10-CM | POA: Insufficient documentation

## 2013-01-31 DIAGNOSIS — Z8659 Personal history of other mental and behavioral disorders: Secondary | ICD-10-CM | POA: Insufficient documentation

## 2013-01-31 DIAGNOSIS — T50905A Adverse effect of unspecified drugs, medicaments and biological substances, initial encounter: Secondary | ICD-10-CM

## 2013-01-31 DIAGNOSIS — L500 Allergic urticaria: Secondary | ICD-10-CM | POA: Insufficient documentation

## 2013-01-31 MED ORDER — DIPHENHYDRAMINE HCL 25 MG PO CAPS: 25.0000 mg | ORAL_CAPSULE | Freq: Four times a day (QID) | ORAL | Status: DC | PRN

## 2013-01-31 MED ORDER — DIPHENHYDRAMINE HCL 25 MG PO CAPS
50.0000 mg | ORAL_CAPSULE | Freq: Once | ORAL | Status: AC
  Administered 2013-01-31: 50 mg via ORAL
  Filled 2013-01-31: qty 2

## 2013-01-31 NOTE — ED Provider Notes (Signed)
CSN: [id number]     Arrival date & time 01/31/13  0522 History     First MD Initiated Contact with Patient 01/31/13 0533     Chief Complaint  Patient presents with  . Pruritis   HPI Larry Adams is a 27 y.o. male presents with generalized pruritus that started last night after taking Opana for the first time, he says is in severe, hasn't been able to sleep and had to leave work early because of the itching, nothing has helped it, including a shower.  No new soaps, contacts.  No noted rash, no shortness of breath, no stridor, difficulty breathing, no abdominal pain, nausea, no vomiting, no chest pain.   Past Medical History  Diagnosis Date  . Back pain   . Anxiety   . Mental disorder    Past Surgical History  Procedure Laterality Date  . Hernia repair     History reviewed. No pertinent family history. History  Substance Use Topics  . Smoking status: Current Some Day Smoker  . Smokeless tobacco: Not on file  . Alcohol Use: Yes     Comment: 1/5 whiskey per day    Review of Systems At least 10pt or greater review of systems completed and are negative except where specified in the HPI.  Allergies  Morphine and related  Home Medications   Current Outpatient Rx  Name  Route  Sig  Dispense  Refill  . ALPRAZolam (XANAX) 1 MG tablet   Oral   Take 1 mg by mouth 3 (three) times daily as needed for anxiety.         Marland Kitchen oxymorphone (OPANA) 10 MG tablet   Oral   Take 10 mg by mouth every 4 (four) hours as needed for pain.         . diphenhydrAMINE (BENADRYL) 25 mg capsule   Oral   Take 1 capsule (25 mg total) by mouth every 6 (six) hours as needed for itching.   30 capsule   0   . ibuprofen (ADVIL,MOTRIN) 600 MG tablet   Oral   Take 1 tablet (600 mg total) by mouth every 6 (six) hours as needed for pain.   30 tablet   0   . LORazepam (ATIVAN) 1 MG tablet   Oral   Take 1 tablet (1 mg total) by mouth every 8 (eight) hours as needed for anxiety.   12 tablet   0     BP 144/92  Pulse 102  Temp(Src) 97.9 F (36.6 C) (Oral)  Resp 18  SpO2 96% Physical Exam  Nursing notes reviewed.  Electronic medical record reviewed. VITAL SIGNS:   Filed Vitals:   01/31/13 0531  BP: 144/92  Pulse: 102  Temp: 97.9 F (36.6 C)  TempSrc: Oral  Resp: 18  SpO2: 96%   CONSTITUTIONAL: Awake, oriented, appears non-toxic HENT: Atraumatic, normocephalic, oral mucosa pink and moist, airway patent. Nares patent without drainage. External ears normal. EYES: Conjunctiva clear, EOMI, PERRLA NECK: Trachea midline, non-tender, supple CARDIOVASCULAR: Normal heart rate, Normal rhythm, No murmurs, rubs, gallops PULMONARY/CHEST: Clear to auscultation, no rhonchi, wheezes, or rales. Symmetrical breath sounds. Non-tender. ABDOMINAL: Non-distended, soft, non-tender - no rebound or guarding.  BS normal. NEUROLOGIC: Non-focal, moving all four extremities, no gross sensory or motor deficits. EXTREMITIES: No clubbing, cyanosis, or edema SKIN: Warm, Dry, No erythema, occasional excoriations on arms and chest  ED Course   Procedures (including critical care time)  Labs Reviewed - No data to display No results found. 1. Itching  due to drug     MDM  Benadryl for symptoms, hold Opana, discuss with physician prescribing pain medicines to change medicine back to Vicodin. Return to the ER for any systemic symptoms of anaphylaxis that the shortness of breath, chest pain, nausea vomiting, abdominal pain wheezing etc., she discharged him stable and good condition. No evidence for systemic anaphylactic reaction at this time.I explained the diagnosis and have given explicit precautions to return to the ER including shortness of breath, severe rash or any other new or worsening symptoms. The patient understands and accepts the medical plan as it's been dictated and I have answered their questions. Discharge instructions concerning home care and prescriptions have been given.  The patient is  STABLE and is discharged to home in good condition.   Jones Skene, MD 01/31/13 [phone]

## 2013-01-31 NOTE — ED Notes (Signed)
C/o itching all over after taking opana for the first time. NAD noted. No resp diff.

## 2013-01-31 NOTE — ED Notes (Signed)
MD at bedside. 

## 2013-05-09 ENCOUNTER — Encounter (HOSPITAL_COMMUNITY): Payer: Self-pay | Admitting: Emergency Medicine

## 2013-05-09 ENCOUNTER — Emergency Department (HOSPITAL_COMMUNITY)
Admission: EM | Admit: 2013-05-09 | Discharge: 2013-05-10 | Disposition: A | Discharge status: Home / Self Care | Payer: Self-pay | Attending: Emergency Medicine | Admitting: Emergency Medicine

## 2013-05-09 DIAGNOSIS — R1110 Vomiting, unspecified: Secondary | ICD-10-CM | POA: Insufficient documentation

## 2013-05-09 DIAGNOSIS — F319 Bipolar disorder, unspecified: Secondary | ICD-10-CM | POA: Insufficient documentation

## 2013-05-09 DIAGNOSIS — F1210 Cannabis abuse, uncomplicated: Secondary | ICD-10-CM | POA: Insufficient documentation

## 2013-05-09 DIAGNOSIS — Z79899 Other long term (current) drug therapy: Secondary | ICD-10-CM | POA: Insufficient documentation

## 2013-05-09 DIAGNOSIS — F10229 Alcohol dependence with intoxication, unspecified: Secondary | ICD-10-CM | POA: Insufficient documentation

## 2013-05-09 DIAGNOSIS — F10929 Alcohol use, unspecified with intoxication, unspecified: Secondary | ICD-10-CM

## 2013-05-09 DIAGNOSIS — F1020 Alcohol dependence, uncomplicated: Secondary | ICD-10-CM

## 2013-05-09 DIAGNOSIS — F17200 Nicotine dependence, unspecified, uncomplicated: Secondary | ICD-10-CM | POA: Insufficient documentation

## 2013-05-09 DIAGNOSIS — F411 Generalized anxiety disorder: Secondary | ICD-10-CM | POA: Insufficient documentation

## 2013-05-09 DIAGNOSIS — R259 Unspecified abnormal involuntary movements: Secondary | ICD-10-CM | POA: Insufficient documentation

## 2013-05-09 LAB — CBC
Hemoglobin: 17.5 g/dL — ABNORMAL HIGH (ref 13.0–17.0)
MCH: 34.4 pg — ABNORMAL HIGH (ref 26.0–34.0)
MCHC: 36.2 g/dL — ABNORMAL HIGH (ref 30.0–36.0)
Platelets: 222 10*3/uL (ref 150–400)
RBC: 5.09 MIL/uL (ref 4.22–5.81)

## 2013-05-09 LAB — RAPID URINE DRUG SCREEN, HOSP PERFORMED
Amphetamines: NOT DETECTED
Barbiturates: NOT DETECTED
Benzodiazepines: NOT DETECTED
Cocaine: NOT DETECTED
Opiates: NOT DETECTED
Tetrahydrocannabinol: POSITIVE — AB

## 2013-05-09 MED ORDER — LORAZEPAM 1 MG PO TABS
0.0000 mg | ORAL_TABLET | Freq: Four times a day (QID) | ORAL | Status: DC
  Administered 2013-05-09: 1 mg via ORAL
  Filled 2013-05-09: qty 1

## 2013-05-09 MED ORDER — THIAMINE HCL 100 MG/ML IJ SOLN: 100.0000 mg | Freq: Every day | INTRAMUSCULAR | Status: DC

## 2013-05-09 MED ORDER — VITAMIN B-1 100 MG PO TABS
100.0000 mg | ORAL_TABLET | Freq: Every day | ORAL | Status: DC
  Administered 2013-05-10: 100 mg via ORAL
  Filled 2013-05-09: qty 1

## 2013-05-09 MED ORDER — LORAZEPAM 1 MG PO TABS: 0.0000 mg | ORAL_TABLET | Freq: Two times a day (BID) | ORAL | Status: DC

## 2013-05-09 NOTE — ED Notes (Addendum)
Pt has one orange carhart jacket, suitcase one(2 sweat jackets,  12 shirts,1 pr of jeans, socks, 1 pr of sweat pants,  And miscellaneous paperwork) .  Black duffle bag has  1 pr of black shoes, cosmetics, 1 electric razor, role of toilet tissue, 2 shirts 1 pr of jeans, cups, mouthwash, and miscellaneous paperwork, personal belongings bag( shirt, sweat pants, and shoes).  Two  cellphone and  1charger, bus pass, and one [employer] room card.  $3.11 in change and one key.

## 2013-05-09 NOTE — ED Provider Notes (Signed)
CSN: [id number]     Arrival date & time 05/09/13  2047 History  This chart was scribed for non-physician practitioner Ivonne Andrew, PA-C, working with Nelia Shi, MD by Dorothey Baseman, ED Scribe. This patient was seen in room [facility] and the patient's care was started at 10:44 PM.    Chief Complaint  Patient presents with  . Medical Clearance  . Alcohol Intoxication   The history is provided by the patient. No language interpreter was used.   HPI Comments: Larry Adams is a 27 y.o. male who presents to the Emergency Department requesting medical clearance for alcohol detoxification. Patient reports that he consumes 9-10 beers daily and uses marijuana. He denies any other drug use. Patient reports that he has sought help for his alcohol abuse in the past, but did not stay with the program. He reports associated tremors and intermittent episodes of emesis for the past few days. He denies seizures and suicidal ideation. Patient reports a history of anxiety and bipolar disorder. Patient is a current everyday smoker, 0.5 PPD.  Past Medical History  Diagnosis Date  . Back pain   . Anxiety   . Mental disorder    Past Surgical History  Procedure Laterality Date  . Hernia repair     History reviewed. No pertinent family history. History  Substance Use Topics  . Smoking status: Current Every Day Smoker -- 0.50 packs/day  . Smokeless tobacco: Not on file  . Alcohol Use: Yes     Comment: 1/5 whiskey per day    Review of Systems  Gastrointestinal: Positive for vomiting.  Neurological: Positive for tremors. Negative for seizures.  Psychiatric/Behavioral: Negative for suicidal ideas.    Allergies  Morphine and related  Home Medications   Current Outpatient Rx  Name  Route  Sig  Dispense  Refill  . calcium carbonate (TUMS - DOSED IN MG ELEMENTAL CALCIUM) 500 MG chewable tablet   Oral   Chew 1 tablet by mouth daily as needed for indigestion or heartburn.         Marland Kitchen ibuprofen  (ADVIL,MOTRIN) 200 MG tablet   Oral   Take 600 mg by mouth every 6 (six) hours as needed. For pain         . ALPRAZolam (XANAX) 1 MG tablet   Oral   Take 1 mg by mouth 3 (three) times daily as needed for anxiety.          Triage Vitals: BP 126/69  Pulse 118  Temp(Src) 99.7 F (37.6 C) (Oral)  Resp 22  Ht 6' (1.829 m)  Wt 330 lb (149.687 kg)  BMI 44.75 kg/m2  SpO2 97%  Physical Exam  Nursing note and vitals reviewed. Constitutional: He is oriented to person, place, and time. He appears well-developed and well-nourished. No distress.  HENT:  Head: Normocephalic and atraumatic.  Eyes: Conjunctivae are normal.  Neck: Normal range of motion. Neck supple.  Cardiovascular: Normal rate, regular rhythm and normal heart sounds.  Exam reveals no gallop and no friction rub.   No murmur heard. Pulmonary/Chest: Effort normal and breath sounds normal. No respiratory distress. He has no wheezes. He has no rales.  Abdominal: He exhibits no distension.  Musculoskeletal: Normal range of motion.  Neurological: He is alert and oriented to person, place, and time.  Skin: Skin is warm and dry.  Psychiatric: He has a normal mood and affect. His behavior is normal.    ED Course  Procedures   DIAGNOSTIC STUDIES: Oxygen Saturation is 97%  on room air, normal by my interpretation.    COORDINATION OF CARE: 10:48 PM- Ordered UA and blood labs. Ordered Ativan, Vitamin B-1, and thiamine to manage symptoms. Discussed treatment plan with patient at bedside and patient verbalized agreement.   Psychiatric holding orders in place. TTS consult placed.  Results for orders placed during the hospital encounter of 05/09/13  ACETAMINOPHEN LEVEL      Result Value Range   Acetaminophen (Tylenol), Serum <15.0  10 - 30 ug/mL  CBC      Result Value Range   WBC 9.0  4.0 - 10.5 K/uL   RBC 5.09  4.22 - 5.81 MIL/uL   Hemoglobin 17.5 (*) 13.0 - 17.0 g/dL   HCT 47.4  25.9 - 56.3 %   MCV 95.1  78.0 - 100.0 fL    MCH 34.4 (*) 26.0 - 34.0 pg   MCHC 36.2 (*) 30.0 - 36.0 g/dL   RDW 87.5  64.3 - 32.9 %   Platelets 222  150 - 400 K/uL  COMPREHENSIVE METABOLIC PANEL      Result Value Range   Sodium 138  135 - 145 mEq/L   Potassium 3.5  3.5 - 5.1 mEq/L   Chloride 104  96 - 112 mEq/L   CO2 23  19 - 32 mEq/L   Glucose, Bld 99  70 - 99 mg/dL   BUN 3 (*) 6 - 23 mg/dL   Creatinine, Ser 5.18  0.50 - 1.35 mg/dL   Calcium 9.2  8.4 - 84.1 mg/dL   Total Protein 7.4  6.0 - 8.3 g/dL   Albumin 3.5  3.5 - 5.2 g/dL   AST 660 (*) 0 - 37 U/L   ALT 61 (*) 0 - 53 U/L   Alkaline Phosphatase 134 (*) 39 - 117 U/L   Total Bilirubin 1.7 (*) 0.3 - 1.2 mg/dL   GFR calc non Af Amer >90  >90 mL/min   GFR calc Af Amer >90  >90 mL/min  ETHANOL      Result Value Range   Alcohol, Ethyl (B) 250 (*) 0 - 11 mg/dL  SALICYLATE LEVEL      Result Value Range   Salicylate Lvl <2.0 (*) 2.8 - 20.0 mg/dL  URINE RAPID DRUG SCREEN (HOSP PERFORMED)      Result Value Range   Opiates NONE DETECTED  NONE DETECTED   Cocaine NONE DETECTED  NONE DETECTED   Benzodiazepines NONE DETECTED  NONE DETECTED   Amphetamines NONE DETECTED  NONE DETECTED   Tetrahydrocannabinol POSITIVE (*) NONE DETECTED   Barbiturates NONE DETECTED  NONE DETECTED       MDM   1. Alcohol dependence   2. Alcohol intoxication        I personally performed the services described in this documentation, which was scribed in my presence. The recorded information has been reviewed and is accurate.     Angus Seller, PA-C 05/10/13 [phone]

## 2013-05-09 NOTE — ED Notes (Signed)
Pt reports that "I am here for the same reason as my dad. It cost too much for [place] to drink." Pt reports he has been drinking x8 [employer] hard lemonades and has been smoking marijuana. Pt reports hx of SI and HI intermittently but not at this time. Pt reports chronic L shoulder pain, as well

## 2013-05-10 DIAGNOSIS — F1020 Alcohol dependence, uncomplicated: Secondary | ICD-10-CM

## 2013-05-10 LAB — COMPREHENSIVE METABOLIC PANEL
ALT: 61 U/L — ABNORMAL HIGH (ref 0–53)
AST: 110 U/L — ABNORMAL HIGH (ref 0–37)
Alkaline Phosphatase: 134 U/L — ABNORMAL HIGH (ref 39–117)
Calcium: 9.2 mg/dL (ref 8.4–10.5)
GFR calc Af Amer: >90 mL/min (ref >90)
Glucose, Bld: 99 mg/dL (ref 70–99)
Potassium: 3.5 mEq/L (ref 3.5–5.1)
Sodium: 138 mEq/L (ref 135–145)
Total Protein: 7.4 g/dL (ref 6.0–8.3)

## 2013-05-10 LAB — ACETAMINOPHEN LEVEL: Acetaminophen (Tylenol), Serum: <15 ug/mL (ref 10–30)
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
## 2013-05-10 NOTE — BH Assessment (Signed)
Left a message with Tanika regarding follow-up for [facility] referral. Waiting for a return call.

## 2013-05-10 NOTE — ED Provider Notes (Signed)
Medical screening examination/treatment/procedure(s) were performed by non-physician practitioner and as supervising physician I was immediately available for consultation/collaboration.   Nelia Shi, MD 05/10/13 [phone]

## 2013-05-10 NOTE — ED Provider Notes (Signed)
The patient was evaluated here. Is not suicidal. Multiple outpatient referrals given regarding alcohol detox and treatment programs.  Roney Marion, MD 05/10/13 [phone]

## 2013-05-10 NOTE — BHH Counselor (Addendum)
Pt now requesting d/c so he can get his belongings from friend. Pt continues to deny SI and HI. He denies [facility] and no delusions noted. Clinical research associate provided resources re: housing and substance abuse treatment as follows:  Careers information officer [facility]) M-F 8am-3pm   [address] [place], [place] [address]   [phone] Services include: laundry, barbering, support groups, case management, phone  & computer access, showers, AA/NA mtgs, mental health/substance abuse nurse, job skills class, disability information, VA assistance, spiritual classes, etc.   [facility] [address] [place], [place]   [phone] Provides breakfast each weekday morning except Wednesdays, and an evening community meal every Friday. Access to showers is available during breakfast hours and telephones for seeking work are also provided. Also offers job referral and counseling for the homeless and unemployed.  HOMELESS SHELTERS [facility]   [employer] [phone] [address]     [facility] [address] [place], [place] [address]     [address], [place]  [phone]      [phone]  [facility]   [facility] [place] [address]    [address] [place] [address]     [place], [place] [address] [phone]       [phone]  [facility] (women only) [address] [place], [place] [address] [phone]  Crisis Services [facility] Mobile Crisis Management - [phone]  [facility] - Community support line: [phone] in [place] call [phone]  Intensive Outpatient Programs [facility]    [facility] [address]     [address] [place],  [place]     [place], [place] [phone]      [phone] Both a day and evening program   *Accepts MCD  [facility]  [facility]substance abuse  treatment ctr [address]     [address] [place], [place] [address] [phone]      [phone]  [facility]    [facility] - Intensive Outpatient [address]     [address] [place], [place] [address]     [place], [place]  [phone] *Accepts MCD     [phone]  Residential Treatment Programs ASAP Residential Treatment    [facility] ([facility] [address]     [address] [place], [place] [phone]      [phone] or [phone]  [facility]     The [address] (Several in [place]) [address], [place] [address]    [address] [place] [address]     [place], [place] [phone]      [phone]  [facility]   [facility] ([facility]) [address]     [address] [place], [place] [address]     [place], [place] [phone]      [phone] Admissions: 8am-3pm M-F  Self-Help/Support Groups [facility] [place]   Narcotics Anonymous (NA) Variety of support groups    [facility] [phone] (call for more info)    [address]        [place] - 2 meetings at this location   [employer], [place] Assessment Counselor

## 2013-05-10 NOTE — ED Notes (Signed)
Pt requesting D/C citing the need to attend to important personal matters.
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
## 2013-05-10 NOTE — Progress Notes (Signed)
Contacted [facility] to follow up on referral, Per Cassell Clement will verify pt referral at their nursing station and contact writer back at [phone]. Rodman Pickle, MHT

## 2013-05-11 ENCOUNTER — Emergency Department (HOSPITAL_COMMUNITY): Payer: Self-pay

## 2013-05-11 ENCOUNTER — Encounter (HOSPITAL_COMMUNITY): Payer: Self-pay | Admitting: Emergency Medicine

## 2013-05-11 ENCOUNTER — Emergency Department (HOSPITAL_COMMUNITY)
Admission: EM | Admit: 2013-05-11 | Discharge: 2013-05-14 | Disposition: A | Discharge status: Home / Self Care | Payer: Self-pay | Attending: Emergency Medicine | Admitting: Emergency Medicine

## 2013-05-11 DIAGNOSIS — F411 Generalized anxiety disorder: Secondary | ICD-10-CM | POA: Insufficient documentation

## 2013-05-11 DIAGNOSIS — Z8739 Personal history of other diseases of the musculoskeletal system and connective tissue: Secondary | ICD-10-CM | POA: Insufficient documentation

## 2013-05-11 DIAGNOSIS — R450 Nervousness: Secondary | ICD-10-CM | POA: Insufficient documentation

## 2013-05-11 DIAGNOSIS — F419 Anxiety disorder, unspecified: Secondary | ICD-10-CM

## 2013-05-11 DIAGNOSIS — F1010 Alcohol abuse, uncomplicated: Secondary | ICD-10-CM | POA: Insufficient documentation

## 2013-05-11 DIAGNOSIS — F17200 Nicotine dependence, unspecified, uncomplicated: Secondary | ICD-10-CM | POA: Insufficient documentation

## 2013-05-11 LAB — COMPREHENSIVE METABOLIC PANEL
AST: 118 U/L — ABNORMAL HIGH (ref 0–37)
Alkaline Phosphatase: 135 U/L — ABNORMAL HIGH (ref 39–117)
BUN: 6 mg/dL (ref 6–23)
CO2: 23 mEq/L (ref 19–32)
Chloride: 105 mEq/L (ref 96–112)
Creatinine, Ser: 0.95 mg/dL (ref 0.50–1.35)
GFR calc non Af Amer: >90 mL/min (ref >90)
Glucose, Bld: 101 mg/dL — ABNORMAL HIGH (ref 70–99)
Potassium: 3.8 mEq/L (ref 3.5–5.1)
Total Bilirubin: 2.7 mg/dL — ABNORMAL HIGH (ref 0.3–1.2)

## 2013-05-11 LAB — RAPID URINE DRUG SCREEN, HOSP PERFORMED
Amphetamines: NOT DETECTED
Barbiturates: NOT DETECTED
Opiates: NOT DETECTED
Tetrahydrocannabinol: POSITIVE — AB

## 2013-05-11 LAB — CBC
HCT: 47.9 % (ref 39.0–52.0)
Hemoglobin: 17.6 g/dL — ABNORMAL HIGH (ref 13.0–17.0)
MCV: 95 fL (ref 78.0–100.0)
RBC: 5.04 MIL/uL (ref 4.22–5.81)
WBC: 8.7 10*3/uL (ref 4.0–10.5)

## 2013-05-11 LAB — ETHANOL: Alcohol, Ethyl (B): <11 mg/dL (ref 0–11)

## 2013-05-11 MED ORDER — LORAZEPAM 1 MG PO TABS
0.0000 mg | ORAL_TABLET | Freq: Two times a day (BID) | ORAL | Status: DC
  Administered 2013-05-12 – 2013-05-13 (×2): 1 mg via ORAL
  Filled 2013-05-11: qty 1

## 2013-05-11 MED ORDER — NICOTINE 21 MG/24HR TD PT24
21.0000 mg | MEDICATED_PATCH | Freq: Every day | TRANSDERMAL | Status: DC
  Administered 2013-05-13 – 2013-05-14 (×2): 21 mg via TRANSDERMAL
  Filled 2013-05-11 (×2): qty 1

## 2013-05-11 MED ORDER — GI COCKTAIL ~~LOC~~
30.0000 mL | Freq: Once | ORAL | Status: AC
  Administered 2013-05-11: 30 mL via ORAL
  Filled 2013-05-11: qty 30

## 2013-05-11 MED ORDER — ZOLPIDEM TARTRATE 5 MG PO TABS
5.0000 mg | ORAL_TABLET | Freq: Every evening | ORAL | Status: DC | PRN
  Administered 2013-05-11 – 2013-05-13 (×3): 5 mg via ORAL
  Filled 2013-05-11 (×3): qty 1

## 2013-05-11 MED ORDER — LORAZEPAM 1 MG PO TABS
1.0000 mg | ORAL_TABLET | Freq: Once | ORAL | Status: AC
Start: 2013-05-11 — End: 2013-05-11
  Administered 2013-05-11: 1 mg via ORAL
  Filled 2013-05-11: qty 1

## 2013-05-11 MED ORDER — ONDANSETRON HCL 4 MG PO TABS: 4.0000 mg | ORAL_TABLET | Freq: Three times a day (TID) | ORAL | Status: DC | PRN

## 2013-05-11 MED ORDER — IBUPROFEN 200 MG PO TABS
600.0000 mg | ORAL_TABLET | Freq: Three times a day (TID) | ORAL | Status: DC | PRN
  Administered 2013-05-13 – 2013-05-14 (×2): 600 mg via ORAL
  Filled 2013-05-11 (×2): qty 3

## 2013-05-11 MED ORDER — LORAZEPAM 1 MG PO TABS
0.0000 mg | ORAL_TABLET | Freq: Four times a day (QID) | ORAL | Status: AC
  Administered 2013-05-11 – 2013-05-13 (×5): 1 mg via ORAL
  Filled 2013-05-11 (×7): qty 1

## 2013-05-11 NOTE — BH Assessment (Addendum)
Assessment Note  Larry Adams is a 27 y.o. single white male.  He presents requesting detoxification from alcohol.  EPIC record shows that pt presented at [facility] within the past 48 hours along with his father, who was admitted to [facility] at that time.  Pt reports seeking treatment today because, "We're just fed up."  Stressors: Pt reports that he had been living with his father locally, but they were evicted today.  He reports that he had to leave the ED yesterday in order to pack up their belongings.  He plans to return to his maternal grandmother's home in [place].  He faces legal problems there including a traffic violation and an assault charge.  He claims that the assault charge is the result of perjury by a neighbor that recently stole items from him.  However, he also reports that his own alcohol problems featured in both legal charges.  His court dates are scheduled for 06/11/13 and 06/12/13.  Pt has been unemployed since 10/2012.  He reports that he was terminated from his last job for insubordination, but adds that he was not provided adequate training for the job.  As a result he is having financial difficulties.  Lethality: Suicidality: When asked about SI within the past 48 hours, pt replies, "It crosses my mind."  With further questioning he endorses only passive SI, believing at times that he may be better off if he were not alive.  However he denies any current or past active SI, plan, or intent, and he has never made a suicide attempt.  He denies any history of self mutilation.  Pt endorses depressed mood with symptoms noted in the "risk to self" assessment below. Homicidality: Pt denies homicidal thoughts or physical aggression, other than a few fights in high school.  Pt denies having access to firearms, although he has had them in the past.  Pt reports the aforesaid legal problems with the qualifier regarding the assault charge.  Pt is calm and cooperative during  assessment. Psychosis: Pt denies hallucinations.  Pt does not appear to be responding to internal stimuli and exhibits no delusional thought.  Pt's reality testing appears to be intact. Substance Abuse: Pt reports that he has been drinking 9 or 10 beers daily for the past 6 - 12 months with few interruptions. His most recent use was 4 beers yesterday.  He also reports smoking a joint of cannabis one to three times a month, with the most recent use on 05/08/13.  However, he does not recognize this as a problem.  He reports that he is prescribed Xanax and Klonopin by the same provider in [place], but that he ran out about 2 weeks ago.  He has been asking for anxiolytic medications in the ED today.  He reports withdrawal symptoms noted in the "Additional Social History" section below, with a CIWA score totaling 6.  He denies any history of seizures or blackouts in withdrawal, but endorses history of DT's.  Social Supports: Pt identifies his maternal grandmother and his father as social supports.  He reports that he has a poor relationship with his mother, whom he claims has active problems with addiction to alcohol.  Treatment History: Pt reports that he was admitted to [facility] around 2005 for problems with depression and substance abuse.  He denies any history of outpatient treatment by behavioral health specialists.  He reports a history of attending 12-Step meetings, but only a few times.  Today he is requesting admission to a  facility for detoxification, and ideally, residential rehabilitation.   Axis I: Alcohol Dependence 303.90; Cannabis Abuse 305.20; Depressive Disorder NOS 311; Anxiety Disorder NOS 300.00 Axis II: Deferred 799.9 Axis III:  Past Medical History  Diagnosis Date  . Back pain   . Anxiety   . Mental disorder    Axis IV: economic problems, housing problems, occupational problems, problems related to legal system/crime and problems with primary support group Axis V: GAF =  45  Past Medical History:  Past Medical History  Diagnosis Date  . Back pain   . Anxiety   . Mental disorder     Past Surgical History  Procedure Laterality Date  . Hernia repair      Family History: History reviewed. No pertinent family history.  Social History:  reports that he has been smoking.  He has quit using smokeless tobacco. He reports that he drinks alcohol. He reports that he uses illicit drugs (Marijuana).  Additional Social History:  Alcohol / Drug Use Pain Medications: Denies Prescriptions: Reports using prescribed Xanax and Klonopin as directed, running out 2 weeks ago. Over the Counter: Denies Longest period of sobriety (when/how long): A few months Negative Consequences of Use: Financial;Legal;Personal relationships;Work / Programmer, multimedia Withdrawal Symptoms: DTs;Tingling;Tremors;Weakness;Diarrhea Substance #1 Name of Substance 1: Alcohol 1 - Age of First Use: 17 1 - Amount (size/oz): 9-10 beers 1 - Frequency: daily 1 - Duration: 6 - 12 months with a few interruptions 1 - Last Use / Amount: 4 beers on 05/10/2013 Substance #2 Name of Substance 2: Marijuana 2 - Age of First Use: 17 2 - Amount (size/oz): 1 joint 2 - Frequency: 1 - 3 times a month 2 - Duration: 9 years 2 - Last Use / Amount: 1 joint on 05/08/2013  CIWA: CIWA-Ar BP: 138/88 mmHg Pulse Rate: 90 Nausea and Vomiting: no nausea and no vomiting Tactile Disturbances: mild itching, pins and needles, burning or numbness Tremor: not visible, but can be felt fingertip to fingertip Auditory Disturbances: not present Paroxysmal Sweats: no sweat visible Visual Disturbances: mild sensitivity Anxiety: mildly anxious Headache, Fullness in Head: none present Agitation: normal activity Orientation and Clouding of Sensorium: oriented and can do serial additions CIWA-Ar Total: 6 COWS:    Allergies:  Allergies  Allergen Reactions  . Morphine And Related Hives    Home Medications:  (Not in a hospital  admission)  OB/GYN Status:  No LMP for male patient.  General Assessment Data Location of Assessment: [facility] Is this a Tele or Face-to-Face Assessment?: Face-to-Face Is this an Initial Assessment or a Re-assessment for this encounter?: Initial Assessment Living Arrangements: Other relatives (Evicted today; will return to [facility] in [place]) Can pt return to current living arrangement?: Yes Admission Status: Voluntary Is patient capable of signing voluntary admission?: Yes Transfer from: Acute Hospital Referral Source: Other [place])  Medical Screening Exam [facility] Walk-[facility] ONLY) Medical Exam completed: No Reason for MSE not completed: Other: (Medically cleared @ [facility])  [facility] Crisis Care Plan Living Arrangements: Other relatives (Evicted today; will return to [facility] in [place]) Name of Psychiatrist: none Name of Therapist: none  Education Status Is patient currently in school?: No  Risk to self Suicidal Ideation: Yes-Currently Present (Passive only, thinking he may be better off dead.) Suicidal Intent: No Is patient at risk for suicide?: No Suicidal Plan?: No Access to Means: No What has been your use of drugs/alcohol within the last 12 months?: Alcohol and cannabis; benzodiazepines by prescription. Previous Attempts/Gestures: No How many times?:  0 Other Self Harm Risks: Disinhibition due to substance abuse; unemployed, unstable living environment Triggers for Past Attempts: Other (Comment) (Not applicable) Intentional Self Injurious Behavior: None Family Suicide History: Yes (Cousin w/ AIDS ended life by GSW) Recent stressful life event(s): Financial Problems;Legal Issues;Other (Comment) (Evicted today, unemployed since 10/2012) Persecutory voices/beliefs?: No Depression: Yes Depression Symptoms: Insomnia;Isolating;Loss of interest in usual pleasures;Feeling worthless/self pity;Feeling angry/irritable (Hopelessness) Substance abuse history and/or treatment for  substance abuse?: Yes (Alcohol and cannabis; benzodiazepines by prescription.) Suicide prevention information given to non-admitted patients: Yes  Risk to Others Homicidal Ideation: No Thoughts of Harm to Others: No Current Homicidal Intent: No Current Homicidal Plan: No Access to Homicidal Means: No Identified Victim: None History of harm to others?: Yes (Hx of fighting in high school) Assessment of Violence: None Noted (Denies; facing assault charge, but pt claims perjury) Violent Behavior Description: Calm/cooperative Does patient have access to weapons?: No (Denies having guns any longer.) Criminal Charges Pending?: Yes Describe Pending Criminal Charges: Traffic violation; assault charge in [place], which pt claims is false. Does patient have a court date: Yes Court Date: 06/11/13 (Also on 06/12/2013)  Psychosis Hallucinations: None noted Delusions: None noted  Mental Status Report Appear/Hygiene: Other (Comment) (Paper scrubs) Eye Contact: Fair Motor Activity: Restlessness (Repetitive right leg motion) Speech: Other (Comment) (Unremarkable) Level of Consciousness: Alert Mood: Depressed;Anxious Affect: Appropriate to circumstance Anxiety Level: Panic Attacks (Currently minimal) Panic attack frequency: Daily Most recent panic attack: 05/11/2013 Thought Processes: Coherent;Relevant Judgement: Unimpaired Orientation: Person;Place;Time;Situation Obsessive Compulsive Thoughts/Behaviors: None  Cognitive Functioning Concentration: Normal Memory: Recent Intact;Remote Intact IQ: Average Insight: Fair Impulse Control: Good Appetite: Good (Excessive, per pt) Weight Loss: 0 Weight Gain: 80 (over past 1.5 years) Sleep: Decreased (Initial insomnia x 3 months; sleeps well thereafter.) Total Hours of Sleep: 9 (8 - 10 hrs/night) Vegetative Symptoms: Staying in bed  ADLScreening [facility] Assessment Services) Patient's cognitive ability adequate to safely complete daily  activities?: Yes Patient able to express need for assistance with ADLs?: Yes Independently performs ADLs?: Yes (appropriate for developmental age)  Prior Inpatient Therapy Prior Inpatient Therapy: Yes Prior Therapy Dates: 2005 Prior Therapy Facilty/Provider(s): [facility] Reason for Treatment: depression and substance abuse  Prior Outpatient Therapy Prior Outpatient Therapy: Yes Prior Therapy Dates: Denies any professional outpatient services Prior Therapy Facilty/Provider(s): Has attended a few Alcoholics Anonymous meetings. Reason for Treatment: Rehab  ADL Screening (condition at time of admission) Patient's cognitive ability adequate to safely complete daily activities?: Yes Is the patient deaf or have difficulty hearing?: No Does the patient have difficulty seeing, even when wearing glasses/contacts?: No Does the patient have difficulty concentrating, remembering, or making decisions?: No Patient able to express need for assistance with ADLs?: Yes Does the patient have difficulty dressing or bathing?: No Independently performs ADLs?: Yes (appropriate for developmental age) Weakness of Legs: None Weakness of Arms/Hands: None  Home Assistive Devices/Equipment Home Assistive Devices/Equipment:  (Reports needing eyeglasses; recently lost them.)    Abuse/Neglect Assessment (Assessment to be complete while patient is alone) Physical Abuse: Denies Verbal Abuse: Yes, past (Comment) Sexual Abuse: Denies Exploitation of patient/patient's resources: Denies Self-Neglect: Denies Values / Beliefs Cultural Requests During Hospitalization: None Spiritual Requests During Hospitalization: None   Advance Directives (For Healthcare) Advance Directive: Patient does not have advance directive;Patient would not like information Pre-existing out of facility DNR order (yellow form or pink MOST form): No Nutrition Screen- [facility] Patient's home diet: Regular  Additional Information 1:1 In  Past 12 Months?: No CIRT Risk: No Elopement Risk:  No Does patient have medical clearance?: Yes     Disposition:  Disposition Initial Assessment Completed for this Encounter: Yes Disposition of Patient: Referred to Patient referred to: [facility] After consulting with Alberteen Sam, NP it has been determined that pt does not pose a life threatening danger to himself or others, but that he would benefit from facility based treatment for detoxification from alcohol.  EDP Jaynie Crumble, PA concurs with this opinion.  Please note that pt is not eligible for admission to [facility] at this time because his father is currently a pt there.  Will pursue placement at [facility] or [facility].  At 00:22 on 05/12/2013 I spoke to [place], MHT who agrees to seek placement.  On Site Evaluation by:   Reviewed with Physician:  Alberteen Sam, NP @ 23:20  Doylene Canning, MA Triage Specialist Raphael Gibney 05/12/2013 12:20 AM

## 2013-05-11 NOTE — ED Notes (Signed)
Pt wanded by security. 

## 2013-05-11 NOTE — ED Provider Notes (Signed)
CSN: [id number]     Arrival date & time 05/11/13  2021 History   First MD Initiated Contact with Patient 05/11/13 2027     Chief Complaint  Patient presents with  . Medical Clearance   (Consider location/radiation/quality/duration/timing/severity/associated sxs/prior Treatment) HPI Larry Adams is a 27 y.o. male who presents to emergency department requesting alcohol detox. Patient states he drinks daily, states drinks about 8 beers a day. States was just here 2 days ago but had to leave because he had to "take care of my apartment or I would've lost it." Patient states his last drink was last night. Patient denies any prior seizures or DTs. States has been to [facility] in the past but states he relapsed due to his anxiety. Patient denies any other drugs. He admits to severe anxiety and depression. States that he is unable to see his psychiatrist was in [place] because currently does not have any transportation. States he's to take Klonopin and Xanax but it is all out. Patient denies any suicidal homicidal ideations.  Past Medical History  Diagnosis Date  . Back pain   . Anxiety   . Mental disorder    Past Surgical History  Procedure Laterality Date  . Hernia repair     History reviewed. No pertinent family history. History  Substance Use Topics  . Smoking status: Current Every Day Smoker -- 0.50 packs/day  . Smokeless tobacco: Not on file  . Alcohol Use: Yes     Comment: 1/5 whiskey per day    Review of Systems  Constitutional: Negative for fever and chills.  Respiratory: Negative for cough, chest tightness and shortness of breath.   Cardiovascular: Negative for chest pain, palpitations and leg swelling.  Gastrointestinal: Negative for nausea, vomiting, abdominal pain, diarrhea and abdominal distention.  Genitourinary: Negative for dysuria, urgency, frequency and hematuria.  Musculoskeletal: Negative for arthralgias, myalgias, neck pain and neck stiffness.  Skin: Negative  for rash.  Allergic/Immunologic: Negative for immunocompromised state.  Neurological: Negative for dizziness, weakness, light-headedness, numbness and headaches.  Psychiatric/Behavioral: The patient is nervous/anxious.     Allergies  Morphine and related  Home Medications   Current Outpatient Rx  Name  Route  Sig  Dispense  Refill  . acetaminophen (TYLENOL) 325 MG tablet   Oral   Take 650 mg by mouth every 6 (six) hours as needed (pain).         Marland Kitchen ALPRAZolam (XANAX) 1 MG tablet   Oral   Take 1 mg by mouth 3 (three) times daily as needed for anxiety.         . calcium carbonate (TUMS - DOSED IN MG ELEMENTAL CALCIUM) 500 MG chewable tablet   Oral   Chew 1 tablet by mouth daily as needed for indigestion or heartburn.         . clonazePAM (KLONOPIN) 2 MG tablet   Oral   Take 2 mg by mouth 2 (two) times daily as needed for anxiety (anxiety).          BP 139/101  Pulse 109  Temp(Src) 98.6 F (37 C) (Oral)  Resp 20  SpO2 93% Physical Exam  Nursing note and vitals reviewed. Constitutional: He appears well-developed and well-nourished. No distress.  HENT:  Head: Normocephalic and atraumatic.  Eyes: Conjunctivae are normal.  Neck: Neck supple.  Cardiovascular: Normal rate, regular rhythm and normal heart sounds.   Pulmonary/Chest: Effort normal. No respiratory distress. He has no wheezes. He has no rales.  Abdominal: Soft. Bowel sounds are  normal. He exhibits no distension. There is no tenderness. There is no rebound.  Musculoskeletal: He exhibits no edema.  Neurological: He is alert.  Mild tremor noted  Skin: Skin is warm and dry.  Psychiatric:  Patient appears to be anxious    ED Course  Procedures (including critical care time) Labs Review Labs Reviewed  CBC - Abnormal; Notable for the following:    Hemoglobin 17.6 (*)    MCH 34.9 (*)    MCHC 36.7 (*)    All other components within normal limits  COMPREHENSIVE METABOLIC PANEL - Abnormal; Notable for the  following:    Glucose, Bld 101 (*)    AST 118 (*)    ALT 62 (*)    Alkaline Phosphatase 135 (*)    Total Bilirubin 2.7 (*)    All other components within normal limits  URINE RAPID DRUG SCREEN (HOSP PERFORMED) - Abnormal; Notable for the following:    Tetrahydrocannabinol POSITIVE (*)    All other components within normal limits  ETHANOL   Imaging Review No results found.  EKG Interpretation   None       MDM   1. Alcohol abuse   2. Anxiety    Patient here for alcohol abuse and anxiety. He is requesting anxiety medications and states he is withdrawing from alcohol. Mild tremor noted on exam.  11:11 PM Patient is medically cleared. CIWA and holding orders ordered. Discussed with [facility] will assess.   12:16 AM Spoke with teaching as again, he was assessed, we'll try to place for alcohol detox.  Lottie Mussel, PA-C 05/12/13 [phone]

## 2013-05-11 NOTE — ED Notes (Signed)
Pt requesting detox from [facility], was here Monday but couldn't complete the process, he also has depression and anxiety issues

## 2013-05-11 NOTE — ED Notes (Signed)
1 navy nautica t shirt, 1 red polo t shirt, 1 navy plaid flannel shirt, 1 red carhart coat, 1 gray long sleeve bobcats shirt, 1 pair black sweat pants, 2 pair reebok tennis shoes, 1 pair levis, fathers wallet, 2 cell phones

## 2013-05-11 NOTE — BH Assessment (Signed)
[facility] Assessment Progress Note  At 22:30 I spoke to [facility], [place] about pt in anticipation of TS assessment.  Doylene Canning, MA Triage Specialist 05/11/2013 @ 23:26

## 2013-05-11 NOTE — ED Notes (Signed)
Pt transferred from triage, presents for Alcohol Detox, admits to drinking 8-9/16 oz beers per day.  Feeling hopeless.  Denies SI, HI or AV hallucinations.  Pt reports diag. With Bipolar DO, Anxiety, Depression and Panic DO.  Pt calm & cooperative at present.

## 2013-05-12 ENCOUNTER — Encounter (HOSPITAL_COMMUNITY): Payer: Self-pay | Admitting: *Deleted

## 2013-05-12 DIAGNOSIS — F10239 Alcohol dependence with withdrawal, unspecified: Secondary | ICD-10-CM

## 2013-05-12 DIAGNOSIS — F1020 Alcohol dependence, uncomplicated: Secondary | ICD-10-CM

## 2013-05-12 NOTE — ED Notes (Signed)
Patient presents calm and cooperative, seeking alcohol detox; denies any thoughts of self harm, denies any thoughts of harm to others; denies any auditory or visual hallucinations; denies any current pain.

## 2013-05-12 NOTE — Progress Notes (Signed)
Received phone call from pt. Counselor, Per Elijah Birk pt needs referral to [facility] or [facility] and can not go to [facility] because his father is there. Marcus in [facility] was informed. Debbe Odea Kahleb Mcclane,MHT

## 2013-05-12 NOTE — Progress Notes (Addendum)
CSW spoke with [place] at [facility], who states patient information has not been reviewed as of yet. Referral sent at 426 am. CSW awaiting return call.   Frutoso Schatz [phone]  ED CSW 05/12/2013 933am   CSW spoke with Bruce, MHT, who states he is faxing referral to [facility].   Marland KitchenCatha Gosselin, [place] [phone]  ED CSW .05/12/2013 947am

## 2013-05-12 NOTE — Progress Notes (Signed)
Patient ID: Larry Adams, male   DOB: October 14, 1985, 27 y.o.   MRN: [id number] Patient Identification:  Larry Adams Date of Evaluation:  05/12/2013   History of Present Illness:  We will look for placement at his facility that have bed for admission.  We will continue detox with Ativan protocol and monitor his withdrawal.  Past Psychiatric History: Alcohol dependence   Past Medical History:     Past Medical History  Diagnosis Date  . Back pain   . Anxiety   . Mental disorder        Past Surgical History  Procedure Laterality Date  . Hernia repair      Allergies:  Allergies  Allergen Reactions  . Morphine And Related Hives    Current Medications:  Prior to Admission medications   Medication Sig Start Date End Date Taking? Authorizing Provider  acetaminophen (TYLENOL) 325 MG tablet Take 650 mg by mouth every 6 (six) hours as needed (pain).   Yes Historical Provider, MD  ALPRAZolam Prudy Feeler) 1 MG tablet Take 1 mg by mouth 3 (three) times daily as needed for anxiety.   Yes Historical Provider, MD  calcium carbonate (TUMS - DOSED IN MG ELEMENTAL CALCIUM) 500 MG chewable tablet Chew 1 tablet by mouth daily as needed for indigestion or heartburn.   Yes Historical Provider, MD  clonazePAM (KLONOPIN) 2 MG tablet Take 2 mg by mouth 2 (two) times daily as needed for anxiety (anxiety).   Yes Historical Provider, MD    Social History:    reports that he has been smoking.  He has quit using smokeless tobacco. He reports that he drinks alcohol. He reports that he uses illicit drugs (Marijuana).   Family History:    History reviewed. No pertinent family history.  Mental Status Examination/Evaluation:Psychiatric Specialty Exam: Physical Exam  ROS  Blood pressure 102/65, pulse 91, temperature 98.2 F (36.8 C), temperature source Oral, resp. rate 20, SpO2 96.00%.There is no weight on file to calculate BMI.  General Appearance: Casual and Disheveled  Eye Contact::  Fair  Speech:   Clear and Coherent and Normal Rate  Volume:  Normal  Mood:  Anxious  Affect:  Appropriate, Congruent and Depressed  Thought Process:  Coherent, Goal Directed and Intact  Orientation:  Full (Time, Place, and Person)  Thought Content:  NA  Suicidal Thoughts:  No  Homicidal Thoughts:  No  Memory:  Immediate;   Good Recent;   Good Remote;   Good  Judgement:  Poor  Insight:  Shallow  Psychomotor Activity:  Increased, Tremor of fingers  Concentration:  Good  Recall:  NA  Akathisia:  NA  Handed:  Right  AIMS (if indicated):     Assets:  Desire for Improvement  Sleep:          DIAGNOSIS:   AXIS I   Alcohol dependence  AXIS II  Deffered  AXIS III See medical notes.  AXIS IV occupational problems, other psychosocial or environmental problems and problems related to social environment  AXIS V 51-60 moderate symptoms     Assessment/Plan: Seen this am with Dr Tawni Carnes We will continue to seek placement at any facility with available bed We will refer him to [facility] or [facility] or [facility] We will continue to treat his withdrawal symptoms. Dahlia Byes   PMHNP-BC

## 2013-05-12 NOTE — Progress Notes (Signed)
B.Dajanique Robley, MHT received report from [place] at [facility] who reports that there are no detox beds available on today and that patient could possibly come in for residential treatment on 05/15/13 pending availability.

## 2013-05-12 NOTE — ED Provider Notes (Signed)
Medical screening examination/treatment/procedure(s) were performed by non-physician practitioner and as supervising physician I was immediately available for consultation/collaboration.  EKG Interpretation   None         Dagmar Hait, MD 05/12/13 [phone]

## 2013-05-12 NOTE — Progress Notes (Signed)
Larry Adams, MHT faxed referral to [facility] in efforts to seek detox placement, spoke with Okey Regal who has accepted referral and reviewed. Okey Regal reports back with a decline due to history of DT's Clinical research associate received phone call from [place] at [facility] provided prescreen information. Romeo Apple will review with RN and report back disposition

## 2013-05-12 NOTE — Progress Notes (Signed)
Contacted [facility] inquire about bed availability, Per Misty Stanley male beds are available. Pt. Demographics, Counselor and MD assessment, Labs, and RN note confirming pt denies SI were faxed. Larry Adams, MHT

## 2013-05-12 NOTE — Progress Notes (Signed)
Pt was seen with NP. Agree with above assessment and plan. 

## 2013-05-13 MED ORDER — ALUM & MAG HYDROXIDE-SIMETH 200-200-20 MG/5ML PO SUSP
30.0000 mL | ORAL | Status: DC | PRN
  Administered 2013-05-13: 30 mL via ORAL
  Filled 2013-05-13: qty 30

## 2013-05-13 NOTE — ED Notes (Signed)
Patient presents seeking alcohol detox; denies any suicidality, homicidality, or auditory or visual hallucinations. Denies any c/o pain. Patient in no apparent distress.

## 2013-05-13 NOTE — ED Notes (Signed)
Resting quietly

## 2013-05-13 NOTE — BH Assessment (Addendum)
BHH Assessment Progress Note Referral faxed to [facility], as [facility] has beds per [place] (per Scherrie Merritts, LCSW).

## 2013-05-13 NOTE — Progress Notes (Signed)
Patient ID: Larry Adams, male   DOB: 06/21/1986, 27 y.o.   MRN: [id number] Patient Identification:  Larry Adams Date of Evaluation:  05/13/2013   History of Present Illness:  Here for detox, waiting for placement at any rehabilitation facility or hospital with bed availability.  He is calm and cooperative and is medicated with Ativan for his detox.  Past Psychiatric History:  Alcohol dependence   Past Medical History:     Past Medical History  Diagnosis Date  . Back pain   . Anxiety   . Mental disorder        Past Surgical History  Procedure Laterality Date  . Hernia repair      Allergies:  Allergies  Allergen Reactions  . Morphine And Related Hives    Current Medications:  Prior to Admission medications   Medication Sig Start Date End Date Taking? Authorizing Provider  acetaminophen (TYLENOL) 325 MG tablet Take 650 mg by mouth every 6 (six) hours as needed (pain).   Yes Historical Provider, MD  ALPRAZolam Prudy Feeler) 1 MG tablet Take 1 mg by mouth 3 (three) times daily as needed for anxiety.   Yes Historical Provider, MD  calcium carbonate (TUMS - DOSED IN MG ELEMENTAL CALCIUM) 500 MG chewable tablet Chew 1 tablet by mouth daily as needed for indigestion or heartburn.   Yes Historical Provider, MD  clonazePAM (KLONOPIN) 2 MG tablet Take 2 mg by mouth 2 (two) times daily as needed for anxiety (anxiety).   Yes Historical Provider, MD    Social History:    reports that he has been smoking.  He has quit using smokeless tobacco. He reports that he drinks alcohol. He reports that he uses illicit drugs (Marijuana).   Family History:    History reviewed. No pertinent family history.  Mental Status Examination/Evaluation:Psychiatric Specialty Exam: Physical Exam  ROS  Blood pressure 122/82, pulse 96, temperature 97.8 F (36.6 C), temperature source Oral, resp. rate 16, SpO2 97.00%.There is no weight on file to calculate BMI.  General Appearance: Casual and Disheveled   Eye Contact::  Good  Speech:  Clear and Coherent and Normal Rate  Volume:  Normal  Mood:  Anxious, Depressed and Dysphoric  Affect:  Congruent, Depressed and Flat  Thought Process:  Coherent and Intact  Orientation:  Full (Time, Place, and Person)  Thought Content:  NA  Suicidal Thoughts:  No  Homicidal Thoughts:  No  Memory:  Immediate;   Good Recent;   Good Remote;   Good  Judgement:  Poor  Insight:  Fair  Psychomotor Activity:  Tremor  Concentration:  Good  Recall:  NA  Akathisia:  NA  Handed:  Right  AIMS (if indicated):     Assets:  Desire for Improvement Housing  Sleep:          DIAGNOSIS:   AXIS I   Alcohol Dependence  AXIS II  Deffered  AXIS III See medical notes.  AXIS IV housing problems, occupational problems, other psychosocial or environmental problems, problems related to legal system/crime, problems related to social environment and problems with primary support group  AXIS V 51-60 moderate symptoms     Assessment/Plan:  Seen on rounds with Dr Elsie Saas We will continue to look for placement forb patient We will continue to detox patient using our Ativan protocol We cannot admit him to our unit due to his father already there for detox.   Larry Adams   PMHNP-BC  Reviewed the information documented and agree with the treatment plan.  Alexsandria Kivett,JANARDHAHA R. 05/13/2013 5:08 PM

## 2013-05-13 NOTE — ED Notes (Signed)
Psych md and josephine np into see 

## 2013-05-13 NOTE — ED Notes (Signed)
Up tot he bathroom to shower and change scrubs 

## 2013-05-14 ENCOUNTER — Encounter (HOSPITAL_COMMUNITY): Payer: Self-pay | Admitting: Registered Nurse

## 2013-05-14 DIAGNOSIS — F1010 Alcohol abuse, uncomplicated: Secondary | ICD-10-CM

## 2013-05-14 MED ORDER — LORAZEPAM 1 MG PO TABS
0.0000 mg | ORAL_TABLET | Freq: Two times a day (BID) | ORAL | Status: DC
  Administered 2013-05-14: 1 mg via ORAL

## 2013-05-14 NOTE — ED Notes (Signed)
TTS into see 

## 2013-05-14 NOTE — ED Notes (Signed)
Pt is aware that he is going to be discharged, declined to shower prior to leaving, will leave after lunch, requesting bus pass.

## 2013-05-14 NOTE — ED Notes (Addendum)
Written dc instructions reviewed w/ pt, pt verbalized understanding.  Pt encouraged to follow with OP resources as instructed.  Pt ambulatory to dc window w/ mHt,  belongings returned after leaving the unit.  Bus pass given.

## 2013-05-14 NOTE — ED Notes (Signed)
Psych md and shuvon NP into see 

## 2013-05-14 NOTE — Consult Note (Signed)
Follow up [facility] Face-to-Face Psychiatry Consult   Reason for Consult:  Follow up consult drinking alcohol and requesting detox Referring Physician:  ER MD  Larry Adams is an 27 y.o. male.  Assessment: AXIS I:  Alcohol Abuse AXIS II:  Deferred AXIS III:   Past Medical History  Diagnosis Date  . Back pain   . Anxiety   . Mental disorder    AXIS IV:  economic problems, housing problems and facing traffic court AXIS V:  51-60 moderate symptoms  Plan:  Recommend psychiatric Inpatient admission when medically cleared.  Subjective:   Larry Adams is a 27 y.o. male patient admitted with alcohol dependence requesting detox.  Patient states that he is feeling all right this morning.   Patient states that he is not wanting to go home.  "I don't want to go home yet.  I want to be away from alcohol for a while.  It is hard not to drink cause I'm surrounded by it."  Patient denies suicidal/homicidal ideation, psychosis, and paranoia.  Patient is showing now other signs of alcohol with drawl; no noted tremors, shaking, sweating; patient denies nausea/vomiting and nervousness.    HPI Elements:   Location:  ER. Quality:  alcohol addiction. Severity:  significant addiction. Timing:  chronic. Duration:  years. Context:  about to be evicted with no place to go.  Past Psychiatric History: Past Medical History  Diagnosis Date  . Back pain   . Anxiety   . Mental disorder     reports that he has been smoking.  He has quit using smokeless tobacco. He reports that he drinks alcohol. He reports that he uses illicit drugs (Marijuana). History reviewed. No pertinent family history. Family History Substance Abuse: Yes, Describe: (Both parents (father currently in treatment at [facility])) Family Supports: Yes, List: (Maternal grandmother, father) Living Arrangements: Other relatives (Evicted today; will return to Maternal GM in [place]) Can pt return to current living arrangement?:  Yes Abuse/Neglect [facility]) Physical Abuse: Denies Verbal Abuse: Yes, past (Comment) Sexual Abuse: Denies Allergies:   Allergies  Allergen Reactions  . Morphine And Related Hives    ACT Assessment Complete:  Yes:    Educational Status    Risk to Self: Risk to self Suicidal Ideation: Yes-Currently Present (Passive only, thinking he may be better off dead.) Suicidal Intent: No Is patient at risk for suicide?: No Suicidal Plan?: No Access to Means: No What has been your use of drugs/alcohol within the last 12 months?: Alcohol and cannabis; benzodiazepines by prescription. Previous Attempts/Gestures: No How many times?: 0 Other Self Harm Risks: Disinhibition due to substance abuse; unemployed, unstable living environment Triggers for Past Attempts: Other (Comment) (Not applicable) Intentional Self Injurious Behavior: None Family Suicide History: Yes (Cousin w/ AIDS ended life by GSW) Recent stressful life event(s): Financial Problems;Legal Issues;Other (Comment) (Evicted today, unemployed since 10/2012) Persecutory voices/beliefs?: No Depression: Yes Depression Symptoms: Insomnia;Isolating;Loss of interest in usual pleasures;Feeling worthless/self pity;Feeling angry/irritable (Hopelessness) Substance abuse history and/or treatment for substance abuse?: Yes Suicide prevention information given to non-admitted patients: Yes  Risk to Others: Risk to Others Homicidal Ideation: No Thoughts of Harm to Others: No Current Homicidal Intent: No Current Homicidal Plan: No Access to Homicidal Means: No Identified Victim: None History of harm to others?: Yes (Hx of fighting in high school) Assessment of Violence: None Noted (Denies; facing assault charge, but pt claims perjury) Violent Behavior Description: Calm/cooperative Does patient have access to weapons?: No (Denies having guns any longer.) Criminal Charges Pending?:  Yes Describe Pending Criminal Charges: Traffic violation; assault charge  in [place], which pt claims is false. Does patient have a court date: Yes Court Date: 06/11/13 (Also on 06/12/2013)  Abuse: Abuse/Neglect Assessment (Assessment to be complete while patient is alone) Physical Abuse: Denies Verbal Abuse: Yes, past (Comment) Sexual Abuse: Denies Exploitation of patient/patient's resources: Denies Self-Neglect: Denies  Prior Inpatient Therapy: Prior Inpatient Therapy Prior Inpatient Therapy: Yes Prior Therapy Dates: 2005 Prior Therapy Facilty/Provider(s): [facility] Reason for Treatment: depression and substance abuse  Prior Outpatient Therapy: Prior Outpatient Therapy Prior Outpatient Therapy: Yes Prior Therapy Dates: Denies any professional outpatient services Prior Therapy Facilty/Provider(s): Has attended a few Alcoholics Anonymous meetings. Reason for Treatment: Rehab  Additional Information: Additional Information 1:1 In Past 12 Months?: No CIRT Risk: No Elopement Risk: No Does patient have medical clearance?: Yes                  Objective: Blood pressure 107/74, pulse 74, temperature 97.3 F (36.3 C), temperature source Oral, resp. rate 18, SpO2 97.00%.There is no weight on file to calculate BMI. Results for orders placed during the hospital encounter of 05/11/13 (from the past 72 hour(s))  CBC     Status: Abnormal   Collection Time    05/11/13  8:51 PM      Result Value Range   WBC 8.7  4.0 - 10.5 K/uL   RBC 5.04  4.22 - 5.81 MIL/uL   Hemoglobin 17.6 (*) 13.0 - 17.0 g/dL   HCT 16.1  09.6 - 04.5 %   MCV 95.0  78.0 - 100.0 fL   MCH 34.9 (*) 26.0 - 34.0 pg   MCHC 36.7 (*) 30.0 - 36.0 g/dL   RDW 40.9  81.1 - 91.4 %   Platelets 192  150 - 400 K/uL  COMPREHENSIVE METABOLIC PANEL     Status: Abnormal   Collection Time    05/11/13  8:51 PM      Result Value Range   Sodium 139  135 - 145 mEq/L   Potassium 3.8  3.5 - 5.1 mEq/L   Chloride 105  96 - 112 mEq/L   CO2 23  19 - 32 mEq/L   Glucose, Bld 101 (*) 70 - 99 mg/dL    BUN 6  6 - 23 mg/dL   Creatinine, Ser 7.82  0.50 - 1.35 mg/dL   Calcium 9.4  8.4 - 95.6 mg/dL   Total Protein 7.3  6.0 - 8.3 g/dL   Albumin 3.7  3.5 - 5.2 g/dL   AST 213 (*) 0 - 37 U/L   ALT 62 (*) 0 - 53 U/L   Alkaline Phosphatase 135 (*) 39 - 117 U/L   Total Bilirubin 2.7 (*) 0.3 - 1.2 mg/dL   GFR calc non Af Amer >90  >90 mL/min   GFR calc Af Amer >90  >90 mL/min   Comment: (NOTE)     The eGFR has been calculated using the CKD EPI equation.     This calculation has not been validated in all clinical situations.     eGFR's persistently <90 mL/min signify possible Chronic Kidney     Disease.  ETHANOL     Status: None   Collection Time    05/11/13  8:51 PM      Result Value Range   Alcohol, Ethyl (B) <11  0 - 11 mg/dL   Comment:            LOWEST DETECTABLE LIMIT FOR  SERUM ALCOHOL IS 11 mg/dL     FOR MEDICAL PURPOSES ONLY  URINE RAPID DRUG SCREEN (HOSP PERFORMED)     Status: Abnormal   Collection Time    05/11/13  9:33 PM      Result Value Range   Opiates NONE DETECTED  NONE DETECTED   Cocaine NONE DETECTED  NONE DETECTED   Benzodiazepines NONE DETECTED  NONE DETECTED   Amphetamines NONE DETECTED  NONE DETECTED   Tetrahydrocannabinol POSITIVE (*) NONE DETECTED   Barbiturates NONE DETECTED  NONE DETECTED   Comment:            DRUG SCREEN FOR MEDICAL PURPOSES     ONLY.  IF CONFIRMATION IS NEEDED     FOR ANY PURPOSE, NOTIFY LAB     WITHIN 5 DAYS.                LOWEST DETECTABLE LIMITS     FOR URINE DRUG SCREEN     Drug Class       Cutoff (ng/mL)     Amphetamine      1000     Barbiturate      200     Benzodiazepine   200     Tricyclics       300     Opiates          300     Cocaine          300     THC              50   Labs are reviewed and are pertinent for alcohol and marijuana.  Current Facility-Administered Medications  Medication Dose Route Frequency Provider Last Rate Last Dose  . alum & mag hydroxide-simeth (MAALOX/MYLANTA) 200-200-20 MG/5ML suspension  30 mL  30 mL Oral PRN Celene Kras, MD   30 mL at 05/13/13 2307  . ibuprofen (ADVIL,MOTRIN) tablet 600 mg  600 mg Oral Q8H PRN Tatyana A Kirichenko, PA-C   600 mg at 05/14/13 1024  . LORazepam (ATIVAN) tablet 0-4 mg  0-4 mg Oral Q12H Nehemiah Settle, MD   1 mg at 05/14/13 1038  . nicotine (NICODERM CQ - dosed in mg/24 hours) patch 21 mg  21 mg Transdermal Daily Tatyana A Kirichenko, PA-C   21 mg at 05/14/13 1024  . ondansetron (ZOFRAN) tablet 4 mg  4 mg Oral Q8H PRN Tatyana A Kirichenko, PA-C      . zolpidem (AMBIEN) tablet 5 mg  5 mg Oral QHS PRN Tatyana A Kirichenko, PA-C   5 mg at 05/13/13 2306   Current Outpatient Prescriptions  Medication Sig Dispense Refill  . acetaminophen (TYLENOL) 325 MG tablet Take 650 mg by mouth every 6 (six) hours as needed (pain).      Marland Kitchen ALPRAZolam (XANAX) 1 MG tablet Take 1 mg by mouth 3 (three) times daily as needed for anxiety.      . calcium carbonate (TUMS - DOSED IN MG ELEMENTAL CALCIUM) 500 MG chewable tablet Chew 1 tablet by mouth daily as needed for indigestion or heartburn.      . clonazePAM (KLONOPIN) 2 MG tablet Take 2 mg by mouth 2 (two) times daily as needed for anxiety (anxiety).        Psychiatric Specialty Exam:     Blood pressure 107/74, pulse 74, temperature 97.3 F (36.3 C), temperature source Oral, resp. rate 18, SpO2 97.00%.There is no weight on file to calculate BMI.  General Appearance: Casual  Eye Contact::  Minimal  Speech:  Clear and Coherent  Volume:  Decreased  Mood:  Depressed  Affect:  Congruent  Thought Process:  Goal Directed  Orientation:  Full (Time, Place, and Person)  Thought Content:  Negative  Suicidal Thoughts:  No  Homicidal Thoughts:  No  Memory:  Immediate;   Good Recent;   Good Remote;   Good  Judgement:  Intact  Insight:  Fair  Psychomotor Activity:  Normal  Concentration:  Good  Recall:  Good  Akathisia:  Negative  Handed:  Right  AIMS (if indicated):     Assets:  Desire for Improvement   Sleep:      Face to face interview/consult with Dr. Elsie Saas  Treatment Plan Summary: resource information for outpatient services  Disposition:  Discharge home.  Give patient resource information for outpatient and rehab services and contact information.  Assunta Found, FNP-BC 05/14/2013 11:48 AM  Reviewed the information documented and agree with the treatment plan.  Nariah Morgano,JANARDHAHA R. 05/15/2013 8:50 AM

## 2013-05-14 NOTE — ED Notes (Signed)
Up to the bathroom 

## 2013-06-03 ENCOUNTER — Encounter (HOSPITAL_COMMUNITY): Payer: Self-pay | Admitting: Emergency Medicine

## 2013-06-03 ENCOUNTER — Emergency Department (HOSPITAL_COMMUNITY)
Admission: EM | Admit: 2013-06-03 | Discharge: 2013-06-05 | Disposition: A | Discharge status: Other Acute Inpatient Hospital | Payer: Self-pay | Attending: Emergency Medicine | Admitting: Emergency Medicine

## 2013-06-03 DIAGNOSIS — F1020 Alcohol dependence, uncomplicated: Secondary | ICD-10-CM | POA: Insufficient documentation

## 2013-06-03 DIAGNOSIS — R45850 Homicidal ideations: Secondary | ICD-10-CM | POA: Insufficient documentation

## 2013-06-03 DIAGNOSIS — R45851 Suicidal ideations: Secondary | ICD-10-CM | POA: Insufficient documentation

## 2013-06-03 DIAGNOSIS — R110 Nausea: Secondary | ICD-10-CM | POA: Insufficient documentation

## 2013-06-03 DIAGNOSIS — F1010 Alcohol abuse, uncomplicated: Secondary | ICD-10-CM

## 2013-06-03 DIAGNOSIS — F3289 Other specified depressive episodes: Secondary | ICD-10-CM | POA: Insufficient documentation

## 2013-06-03 DIAGNOSIS — Z8739 Personal history of other diseases of the musculoskeletal system and connective tissue: Secondary | ICD-10-CM | POA: Insufficient documentation

## 2013-06-03 DIAGNOSIS — F329 Major depressive disorder, single episode, unspecified: Secondary | ICD-10-CM

## 2013-06-03 DIAGNOSIS — IMO0002 Reserved for concepts with insufficient information to code with codable children: Secondary | ICD-10-CM | POA: Insufficient documentation

## 2013-06-03 DIAGNOSIS — F17200 Nicotine dependence, unspecified, uncomplicated: Secondary | ICD-10-CM | POA: Insufficient documentation

## 2013-06-03 DIAGNOSIS — R450 Nervousness: Secondary | ICD-10-CM | POA: Insufficient documentation

## 2013-06-03 DIAGNOSIS — F32A Depression, unspecified: Secondary | ICD-10-CM

## 2013-06-03 DIAGNOSIS — R443 Hallucinations, unspecified: Secondary | ICD-10-CM | POA: Insufficient documentation

## 2013-06-03 DIAGNOSIS — F411 Generalized anxiety disorder: Secondary | ICD-10-CM | POA: Insufficient documentation

## 2013-06-03 LAB — COMPREHENSIVE METABOLIC PANEL
AST: 148 U/L — ABNORMAL HIGH (ref 0–37)
Albumin: 3.5 g/dL (ref 3.5–5.2)
BUN: 3 mg/dL — ABNORMAL LOW (ref 6–23)
Calcium: 8.8 mg/dL (ref 8.4–10.5)
Chloride: 106 mEq/L (ref 96–112)
Creatinine, Ser: 1.06 mg/dL (ref 0.50–1.35)
Total Protein: 7.5 g/dL (ref 6.0–8.3)

## 2013-06-03 LAB — CBC
HCT: 49.9 % (ref 39.0–52.0)
MCH: 34.7 pg — ABNORMAL HIGH (ref 26.0–34.0)
MCV: 95 fL (ref 78.0–100.0)
Platelets: 208 10*3/uL (ref 150–400)
RBC: 5.25 MIL/uL (ref 4.22–5.81)
RDW: 12.5 % (ref 11.5–15.5)
WBC: 8.1 10*3/uL (ref 4.0–10.5)

## 2013-06-03 LAB — RAPID URINE DRUG SCREEN, HOSP PERFORMED
Amphetamines: NOT DETECTED
Benzodiazepines: NOT DETECTED
Cocaine: NOT DETECTED
Opiates: NOT DETECTED

## 2013-06-03 LAB — SALICYLATE LEVEL: Salicylate Lvl: <2 mg/dL — ABNORMAL LOW (ref 2.8–20.0)

## 2013-06-03 MED ORDER — IBUPROFEN 400 MG PO TABS: 600.0000 mg | ORAL_TABLET | Freq: Three times a day (TID) | ORAL | Status: DC | PRN

## 2013-06-03 MED ORDER — THIAMINE HCL 100 MG/ML IJ SOLN: 100.0000 mg | Freq: Every day | INTRAMUSCULAR | Status: DC

## 2013-06-03 MED ORDER — LORAZEPAM 1 MG PO TABS: 0.0000 mg | ORAL_TABLET | Freq: Two times a day (BID) | ORAL | Status: DC

## 2013-06-03 MED ORDER — ACETAMINOPHEN 325 MG PO TABS
650.0000 mg | ORAL_TABLET | ORAL | Status: DC | PRN
  Administered 2013-06-04: 650 mg via ORAL
  Filled 2013-06-03: qty 2

## 2013-06-03 MED ORDER — ALUM & MAG HYDROXIDE-SIMETH 200-200-20 MG/5ML PO SUSP: 30.0000 mL | ORAL | Status: DC | PRN

## 2013-06-03 MED ORDER — LORAZEPAM 1 MG PO TABS
1.0000 mg | ORAL_TABLET | Freq: Once | ORAL | Status: AC
  Administered 2013-06-03: 1 mg via ORAL
  Filled 2013-06-03: qty 1

## 2013-06-03 MED ORDER — NICOTINE 21 MG/24HR TD PT24
21.0000 mg | MEDICATED_PATCH | Freq: Every day | TRANSDERMAL | Status: DC
  Administered 2013-06-03 – 2013-06-05 (×3): 21 mg via TRANSDERMAL
  Filled 2013-06-03 (×3): qty 1

## 2013-06-03 MED ORDER — LORAZEPAM 1 MG PO TABS
1.0000 mg | ORAL_TABLET | Freq: Three times a day (TID) | ORAL | Status: DC | PRN
  Administered 2013-06-03 – 2013-06-05 (×6): 1 mg via ORAL
  Filled 2013-06-03 (×5): qty 1

## 2013-06-03 MED ORDER — VITAMIN B-1 100 MG PO TABS
100.0000 mg | ORAL_TABLET | Freq: Every day | ORAL | Status: DC
  Administered 2013-06-03 – 2013-06-04 (×2): 100 mg via ORAL
  Filled 2013-06-03 (×3): qty 1

## 2013-06-03 MED ORDER — LORAZEPAM 1 MG PO TABS
0.0000 mg | ORAL_TABLET | Freq: Four times a day (QID) | ORAL | Status: AC
  Administered 2013-06-04: 1 mg via ORAL
  Administered 2013-06-04: 2 mg via ORAL
  Filled 2013-06-03: qty 1
  Filled 2013-06-03: qty 2
  Filled 2013-06-03: qty 1

## 2013-06-03 MED ORDER — ZOLPIDEM TARTRATE 5 MG PO TABS: 5.0000 mg | ORAL_TABLET | Freq: Every evening | ORAL | Status: DC | PRN

## 2013-06-03 MED ORDER — ONDANSETRON HCL 4 MG PO TABS
4.0000 mg | ORAL_TABLET | Freq: Three times a day (TID) | ORAL | Status: DC | PRN
  Administered 2013-06-03 – 2013-06-04 (×3): 4 mg via ORAL
  Filled 2013-06-03 (×3): qty 1

## 2013-06-03 NOTE — ED Notes (Signed)
Pt called out for help and this RN responded. Pt states he is hearing voices that are telling him to do bad things to his father and he doesn't want to. States he has been taking klonopin and xanax for 5 years but as soon as he gets them they are gone. Asked patient if he is the one taking them and he states yes. When asked "are you taking them like you should be taking them?" his reply was No. Pt states he has tried to get help before but keeps getting brushed off.

## 2013-06-03 NOTE — ED Provider Notes (Signed)
CSN: [id number]     Arrival date & time 06/03/13  1644 History   First MD Initiated Contact with Patient 06/03/13 1718     Chief Complaint  Patient presents with  . Medical Clearance   (Consider location/radiation/quality/duration/timing/severity/associated sxs/prior Treatment) HPI Pt is a 27yo male with hx of PTSD, bipolar disorder and anxiety requesting help.  Pt states he needs help with his PTSD as well as SI and alcholism.  States he has been at [facility] several times before but "they just keep sending me away, I need inpatient care, I cannot be out there."  Pt states he is hearing voices telling him to fight others and to shoot himself. Pt does have access to guns at home.  Pt also reports drinking alcohol daily, usually beer but occasionally liquor.  Feels like he must have a drink as soon as he wakes in the morning.  Denies use of cocaine or heroine.    Past Medical History  Diagnosis Date  . Back pain   . Anxiety   . Mental disorder    Past Surgical History  Procedure Laterality Date  . Hernia repair     History reviewed. No pertinent family history. History  Substance Use Topics  . Smoking status: Current Every Day Smoker -- 0.50 packs/day  . Smokeless tobacco: Former Neurosurgeon  . Alcohol Use: Yes     Comment: 1/5 whiskey per day    Review of Systems  Constitutional: Negative for fever and chills.  Gastrointestinal: Positive for nausea. Negative for vomiting, abdominal pain and diarrhea.  Psychiatric/Behavioral: Positive for suicidal ideas, hallucinations and agitation. Negative for self-injury. The patient is nervous/anxious.   All other systems reviewed and are negative.    Allergies  Morphine and related  Home Medications   Current Outpatient Rx  Name  Route  Sig  Dispense  Refill  . calcium carbonate (TUMS - DOSED IN MG ELEMENTAL CALCIUM) 500 MG chewable tablet   Oral   Chew 1 tablet by mouth daily as needed for indigestion or heartburn.         Marland Kitchen ibuprofen  (ADVIL,MOTRIN) 800 MG tablet   Oral   Take 800 mg by mouth every 8 (eight) hours as needed.          BP 150/117  Pulse 93  Temp(Src) 98.2 F (36.8 C) (Oral)  Resp 20  Ht 6' (1.829 m)  Wt 330 lb 6 oz (149.857 kg)  BMI 44.80 kg/m2  SpO2 99% Physical Exam  Nursing note and vitals reviewed. Constitutional: He is oriented to person, place, and time. He appears well-developed and well-nourished.  Pt sitting in exam bed, appears anxious and jittery  HENT:  Head: Normocephalic and atraumatic.  Eyes: Conjunctivae are normal. No scleral icterus.  Neck: Normal range of motion.  Cardiovascular: Normal rate, regular rhythm and normal heart sounds.   Pulmonary/Chest: Effort normal and breath sounds normal. No respiratory distress. He has no wheezes. He has no rales. He exhibits no tenderness.  Abdominal: Soft. He exhibits no distension. There is no tenderness.  Musculoskeletal: Normal range of motion.  Neurological: He is alert and oriented to person, place, and time.  Skin: Skin is warm and dry.  Psychiatric: His behavior is normal. His mood appears anxious. He is not actively hallucinating. Thought content is not delusional. He expresses homicidal and suicidal ideation. He expresses suicidal plans. He expresses no homicidal plans.    ED Course  Procedures (including critical care time) Labs Review Labs Reviewed  CBC -  Abnormal; Notable for the following:    Hemoglobin 18.2 (*)    MCH 34.7 (*)    MCHC 36.5 (*)    All other components within normal limits  COMPREHENSIVE METABOLIC PANEL - Abnormal; Notable for the following:    Potassium 3.3 (*)    Glucose, Bld 120 (*)    BUN 3 (*)    AST 148 (*)    ALT 74 (*)    Total Bilirubin 1.3 (*)    All other components within normal limits  ETHANOL - Abnormal; Notable for the following:    Alcohol, Ethyl (B) 298 (*)    All other components within normal limits  SALICYLATE LEVEL - Abnormal; Notable for the following:    Salicylate Lvl  <2.0 (*)    All other components within normal limits  URINE RAPID DRUG SCREEN (HOSP PERFORMED) - Abnormal; Notable for the following:    Tetrahydrocannabinol POSITIVE (*)    All other components within normal limits  ACETAMINOPHEN LEVEL   Imaging Review No results found.  EKG Interpretation   None       MDM   1. Suicidal ideation   2. Alcohol abuse   3. Depression    Pt here requesting "help" for his PTSD, depression and alcohol abuse. Reports recurrent SI, states he plans to shoot himself and does have access to guns at home.  Reports being in [facility] several times but they "always send him away," requests inpatient rehab.  Pt medically cleared for evaluation by TTS.  Psych hold orders placed. Disposition to be determined by TTS/BH.    Junius Finner, PA-C 06/03/13 2009

## 2013-06-03 NOTE — ED Notes (Signed)
To room from E45.

## 2013-06-03 NOTE — ED Notes (Signed)
Pt reports he has been hearing voices for the past couple of days that have been telling him to fight. Reports that he has been drinking today, and right before he came here. States that he has PTSD and has been seen here before with the same. Also, reports SI and states that he would shoot himself if possible.

## 2013-06-03 NOTE — ED Notes (Signed)
Pt awake, c/o feeling anxious and nausea.  Requesting medications.

## 2013-06-04 ENCOUNTER — Encounter (HOSPITAL_COMMUNITY): Payer: Self-pay | Admitting: *Deleted

## 2013-06-04 MED ORDER — FOLIC ACID 1 MG PO TABS
1.0000 mg | ORAL_TABLET | Freq: Every day | ORAL | Status: DC
  Administered 2013-06-04 – 2013-06-05 (×2): 1 mg via ORAL
  Filled 2013-06-04 (×2): qty 1

## 2013-06-04 MED ORDER — ADULT MULTIVITAMIN W/MINERALS CH
1.0000 | ORAL_TABLET | Freq: Every day | ORAL | Status: DC
  Administered 2013-06-04 – 2013-06-05 (×2): 1 via ORAL
  Filled 2013-06-04 (×2): qty 1

## 2013-06-04 NOTE — BH Assessment (Signed)
Received call for tele-assessment at 0610. Spoke with Dr. Ranae Palms who said Pt has a hx of PTSD and alcohol dependence and is expressing SI. Tele-assessment will be initiated.  Harlin Rain Ria Comment, [facility] Triage Specialist

## 2013-06-04 NOTE — BH Assessment (Signed)
Attempted to connect to tele-assessment. Equipment is not ready. Will pass assessment to day shift as currently there is insufficient time to complete assessment process and give shift report.  Harlin Rain Ria Comment, [facility] Triage Specialist
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
## 2013-06-04 NOTE — ED Notes (Signed)
Patient resting, sitter at the bedside. 

## 2013-06-04 NOTE — ED Notes (Signed)
Patient is resting comfortably, sitter at the bedside. 

## 2013-06-04 NOTE — ED Notes (Signed)
Breakfast trays being delivered, patient still resting.

## 2013-06-04 NOTE — ED Notes (Signed)
telepsych in progress 

## 2013-06-04 NOTE — BH Assessment (Signed)
Pt was ran by Nanine Means NP, pt was accepted at [facility] pending bed availability. Recommendations were also given to continue to seek placement elsewhere, Mariya disposition MHT made aware. Attempted to call nurse to make her aware of disposition at [phone] was left on hold, will attempt to ry again later.

## 2013-06-04 NOTE — ED Notes (Signed)
Patient is resting comfortably, with sitter at bedside. 

## 2013-06-04 NOTE — ED Notes (Signed)
Patient is resting comfortably, with sitter at the bedside. 

## 2013-06-05 NOTE — Progress Notes (Signed)
Received report from attending RN that the patient has been served and is waiting to transferred today. The RN was not provided a time.  -T.Angeleah Labrake, MHT

## 2013-06-05 NOTE — ED Provider Notes (Signed)
Medical screening examination/treatment/procedure(s) were performed by non-physician practitioner and as supervising physician I was immediately available for consultation/collaboration.  EKG Interpretation   None         Shelda Jakes, MD 06/05/13 [phone]

## 2013-06-05 NOTE — ED Notes (Signed)
Reported has been called by Isabelle Course RN to [facility]. Sheriff has been notified that patient needs to be transported to [facility] and IVC papers have been received. Pt has been updated regarding plan of care. No s/s of distress noted from patient. Will continue to monitor. Sitter is still present with patient.

## 2013-06-05 NOTE — Progress Notes (Signed)
1014 06-05-13 IVC demographics portion completed, waiting for a notary to finalize docs. Left a voicemail with CSW office, since the house coverage does not have her notary stamp today. Writer will follow-up at a later time.

## 2013-06-05 NOTE — Progress Notes (Signed)
Underwriter initiated bed placement for inpatient treatment for pt.  The following hospitals were faxed referral based on bed availability: 1)[facility] 2)[facility] 3)[facility] 4)[facility] 5)[facility] 6)[facility] 8)[facility] 9)[facility] 10)[facility]  Blain Pais, MHT/NS

## 2013-06-05 NOTE — ED Notes (Signed)
Notfied Sheriff's Dept and left a msg

## 2013-06-05 NOTE — Progress Notes (Signed)
Writer spoke with intake from [facility]. Patient has been accepted by Dr. Minna Merritts. The nurse to nurse report number is [id number]. The writer informed Magda Paganini, RN. -T.Duayne Brideau, MHT

## 2013-06-05 NOTE — ED Notes (Signed)
Pt called out stating he feels "weird". Upon further questioning, the pt states that he believes that he is having panic attack.

## 2013-06-05 NOTE — Progress Notes (Addendum)
Writer completed the IVC forms and have been  received my Magistrate [place]. Writer provided a copy of the IIVC documents to [employer] and informed her that the patient will need to be transferred to [facility]. -T.Venecia Mehl , MHT

## 2013-06-24 ENCOUNTER — Encounter (HOSPITAL_COMMUNITY): Payer: Self-pay | Admitting: Emergency Medicine

## 2013-06-24 ENCOUNTER — Emergency Department (HOSPITAL_COMMUNITY)
Admission: EM | Admit: 2013-06-24 | Discharge: 2013-06-24 | Disposition: A | Discharge status: Home / Self Care | Payer: Self-pay | Attending: Emergency Medicine | Admitting: Emergency Medicine

## 2013-06-24 DIAGNOSIS — F1320 Sedative, hypnotic or anxiolytic dependence, uncomplicated: Secondary | ICD-10-CM | POA: Insufficient documentation

## 2013-06-24 DIAGNOSIS — F19939 Other psychoactive substance use, unspecified with withdrawal, unspecified: Secondary | ICD-10-CM | POA: Insufficient documentation

## 2013-06-24 DIAGNOSIS — F410 Panic disorder [episodic paroxysmal anxiety] without agoraphobia: Secondary | ICD-10-CM | POA: Insufficient documentation

## 2013-06-24 DIAGNOSIS — R110 Nausea: Secondary | ICD-10-CM | POA: Insufficient documentation

## 2013-06-24 DIAGNOSIS — F419 Anxiety disorder, unspecified: Secondary | ICD-10-CM

## 2013-06-24 DIAGNOSIS — F17200 Nicotine dependence, unspecified, uncomplicated: Secondary | ICD-10-CM | POA: Insufficient documentation

## 2013-06-24 DIAGNOSIS — F13239 Sedative, hypnotic or anxiolytic dependence with withdrawal, unspecified: Secondary | ICD-10-CM

## 2013-06-24 DIAGNOSIS — G479 Sleep disorder, unspecified: Secondary | ICD-10-CM | POA: Insufficient documentation

## 2013-06-24 DIAGNOSIS — E669 Obesity, unspecified: Secondary | ICD-10-CM | POA: Insufficient documentation

## 2013-06-24 DIAGNOSIS — R002 Palpitations: Secondary | ICD-10-CM | POA: Insufficient documentation

## 2013-06-24 MED ORDER — LORAZEPAM 1 MG PO TABS: 0.5000 mg | ORAL_TABLET | Freq: Three times a day (TID) | ORAL | Status: DC | PRN

## 2013-06-24 MED ORDER — LORAZEPAM 1 MG PO TABS
1.0000 mg | ORAL_TABLET | Freq: Once | ORAL | Status: AC
  Administered 2013-06-24: 1 mg via ORAL
  Filled 2013-06-24: qty 1

## 2013-06-24 NOTE — ED Notes (Signed)
Pt c/o anxiety x 2 days.  States hx of same.  C/o insomnia.  Denies SI/HI.

## 2013-06-24 NOTE — ED Provider Notes (Signed)
CSN: [id number]     Arrival date & time 06/24/13  1447 History  This chart was scribed for non-physician practitioner, ,working with Junius Argyle, MD, by Karle Plumber, ED Scribe.  This patient was seen in room WTR9/WTR9 and the patient's care was started at 7:17 PM.  Chief Complaint  Patient presents with  . Anxiety   The history is provided by the patient. No language interpreter was used.   HPI Comments:  Larry Adams is a 27 y.o. male with h/o anxiety who presents to the Emergency Department complaining of severe anxiety and having panic attacks all day. Pt reports taking Xanax and Klonipin for anxiety but he has been out for approximately two days. He states he has taken these medications daily for several years. He states he ran out of his medication and has just moved here from [facility] and has not been able to obtain another PCP for another prescription. Pt reports associated heart racing and feeling like he is "going to jump out of his skin" and nausea. Pt states he was here earlier in the month and was placed in [facility] for alcohol detox. He states it was a successful treatment program and states he now goes to meetings at [facility]. He reports his last drink was on Christmas Day and states it was a very small amount for a tradition. He denies vomiting, diarrhea, abdominal pain and chest pain.   Past Medical History  Diagnosis Date  . Back pain   . Anxiety   . Mental disorder    Past Surgical History  Procedure Laterality Date  . Hernia repair     No family history on file. History  Substance Use Topics  . Smoking status: Current Every Day Smoker -- 0.50 packs/day    Types: Cigarettes  . Smokeless tobacco: Former Neurosurgeon  . Alcohol Use: Yes     Comment: 1/5 whiskey per day    Review of Systems  Constitutional: Negative for fever and chills.  HENT: Negative for rhinorrhea and sore throat.   Eyes: Negative for redness.  Respiratory: Negative for cough.    Cardiovascular: Positive for palpitations. Negative for chest pain.  Gastrointestinal: Positive for nausea. Negative for vomiting, abdominal pain and diarrhea.  Genitourinary: Negative for dysuria.  Musculoskeletal: Negative for myalgias.  Skin: Negative for rash.  Neurological: Negative for headaches.  Psychiatric/Behavioral: Positive for sleep disturbance. The patient is nervous/anxious.     Allergies  Morphine and related  Home Medications   Current Outpatient Rx  Name  Route  Sig  Dispense  Refill  . calcium carbonate (TUMS - DOSED IN MG ELEMENTAL CALCIUM) 500 MG chewable tablet   Oral   Chew 1 tablet by mouth daily as needed for indigestion or heartburn.         Marland Kitchen ibuprofen (ADVIL,MOTRIN) 800 MG tablet   Oral   Take 800 mg by mouth every 8 (eight) hours as needed.          Triage Vitals: BP 137/91  Pulse 87  Temp(Src) 97.9 F (36.6 C) (Oral)  Resp 16  SpO2 100% Physical Exam  Nursing note and vitals reviewed. Constitutional: He appears well-developed and well-nourished.  Obese  HENT:  Head: Normocephalic and atraumatic.  Eyes: Conjunctivae are normal. Right eye exhibits no discharge. Left eye exhibits no discharge.  Neck: Normal range of motion. Neck supple.  Cardiovascular: Normal rate, regular rhythm and normal heart sounds.   Pulmonary/Chest: Effort normal and breath sounds normal. No respiratory distress. He  has no wheezes. He has no rales. He exhibits no tenderness.  Abdominal: Soft. He exhibits no distension. There is no tenderness.  Musculoskeletal: Normal range of motion.  Neurological: He is alert.  Skin: Skin is warm and dry.  Psychiatric: He has a normal mood and affect. He expresses no homicidal and no suicidal ideation.  Anxious appearing.    ED Course  Procedures (including critical care time) DIAGNOSTIC STUDIES: Oxygen Saturation is 100% on RA, normal by my interpretation.   COORDINATION OF CARE: 7:21 PM- Will give pt Ativan prior to  discharge. Pt verbalizes understanding and agrees to plan.  Medications  LORazepam (ATIVAN) tablet 1 mg (not administered)    Labs Review Labs Reviewed - No data to display Imaging Review No results found.  EKG Interpretation   None      7:28 PM Patient seen and examined. Medications ordered.   Vital signs reviewed and are as follows: Filed Vitals:   06/24/13 1533  BP: 137/91  Pulse: 87  Temp: 97.9 F (36.6 C)  Resp: 16   [facility] referrals given. Pt encouraged to find provider closer to [facility] Encouraged continued avoidance of alcohol.    MDM   1. Benzodiazepine withdrawal   2. Anxiety    Patient with worsening anxiety, withdrawal from benzodiazepine. Small amount #8 1mg  tabs given to help with symptoms. Encouraged to f/u. No SI/HI. No need for psych eval.   I personally performed the services described in this documentation, which was scribed in my presence. The recorded information has been reviewed and is accurate.    Renne Crigler, PA-C 06/24/13 1929

## 2013-06-24 NOTE — ED Notes (Signed)
Pt says he has been dealing with anxiety for the past 2 days; denies SI/HI.

## 2013-06-25 NOTE — ED Provider Notes (Signed)
Medical screening examination/treatment/procedure(s) were performed by non-physician practitioner and as supervising physician I was immediately available for consultation/collaboration.  EKG Interpretation   None         Junius Argyle, MD 06/25/13 0020

## 2014-06-24 ENCOUNTER — Emergency Department (HOSPITAL_COMMUNITY)
Admission: EM | Admit: 2014-06-24 | Discharge: 2014-06-25 | Disposition: A | Discharge status: Other Acute Inpatient Hospital | Payer: Self-pay | Attending: Emergency Medicine | Admitting: Emergency Medicine

## 2014-06-24 ENCOUNTER — Encounter (HOSPITAL_COMMUNITY): Payer: Self-pay | Admitting: Emergency Medicine

## 2014-06-24 DIAGNOSIS — Z79899 Other long term (current) drug therapy: Secondary | ICD-10-CM | POA: Insufficient documentation

## 2014-06-24 DIAGNOSIS — R45850 Homicidal ideations: Secondary | ICD-10-CM

## 2014-06-24 DIAGNOSIS — F419 Anxiety disorder, unspecified: Secondary | ICD-10-CM | POA: Insufficient documentation

## 2014-06-24 DIAGNOSIS — Z720 Tobacco use: Secondary | ICD-10-CM | POA: Insufficient documentation

## 2014-06-24 DIAGNOSIS — F1320 Sedative, hypnotic or anxiolytic dependence, uncomplicated: Secondary | ICD-10-CM | POA: Insufficient documentation

## 2014-06-24 DIAGNOSIS — F1010 Alcohol abuse, uncomplicated: Secondary | ICD-10-CM | POA: Insufficient documentation

## 2014-06-24 LAB — COMPREHENSIVE METABOLIC PANEL
ALBUMIN: 4 g/dL (ref 3.5–5.2)
ALT: 48 U/L (ref 0–53)
AST: 67 U/L — AB (ref 0–37)
Alkaline Phosphatase: 77 U/L (ref 39–117)
Anion gap: 6 (ref 5–15)
BILIRUBIN TOTAL: 1.4 mg/dL — AB (ref 0.3–1.2)
BUN: <5 mg/dL — ABNORMAL LOW (ref 6–23)
CO2: 29 mmol/L (ref 19–32)
CREATININE: 1.06 mg/dL (ref 0.50–1.35)
Calcium: 8.9 mg/dL (ref 8.4–10.5)
Chloride: 107 mEq/L (ref 96–112)
GFR calc Af Amer: >90 mL/min (ref >90)
GFR calc non Af Amer: >90 mL/min (ref >90)
Glucose, Bld: 99 mg/dL (ref 70–99)
Potassium: 3.7 mmol/L (ref 3.5–5.1)
Sodium: 142 mmol/L (ref 135–145)
Total Protein: 7.3 g/dL (ref 6.0–8.3)

## 2014-06-24 LAB — CBC
HCT: 50.2 % (ref 39.0–52.0)
Hemoglobin: 17 g/dL (ref 13.0–17.0)
MCH: 32.6 pg (ref 26.0–34.0)
MCHC: 33.9 g/dL (ref 30.0–36.0)
MCV: 96.4 fL (ref 78.0–100.0)
PLATELETS: 221 10*3/uL (ref 150–400)
RBC: 5.21 MIL/uL (ref 4.22–5.81)
RDW: 13.4 % (ref 11.5–15.5)
WBC: 11 10*3/uL — ABNORMAL HIGH (ref 4.0–10.5)

## 2014-06-24 LAB — SALICYLATE LEVEL

## 2014-06-24 LAB — ACETAMINOPHEN LEVEL

## 2014-06-24 LAB — ETHANOL: ALCOHOL ETHYL (B): 203 mg/dL — AB (ref 0–9)

## 2014-06-24 MED ORDER — ZOLPIDEM TARTRATE 5 MG PO TABS
5.0000 mg | ORAL_TABLET | Freq: Every evening | ORAL | Status: DC | PRN
  Administered 2014-06-25: 5 mg via ORAL
  Filled 2014-06-24: qty 1

## 2014-06-24 MED ORDER — CLONAZEPAM 0.5 MG PO TABS
2.0000 mg | ORAL_TABLET | Freq: Two times a day (BID) | ORAL | Status: DC
  Administered 2014-06-24: 2 mg via ORAL
  Filled 2014-06-24: qty 4

## 2014-06-24 MED ORDER — ALUM & MAG HYDROXIDE-SIMETH 200-200-20 MG/5ML PO SUSP: 30.0000 mL | ORAL | Status: DC | PRN

## 2014-06-24 MED ORDER — IBUPROFEN 200 MG PO TABS: 600.0000 mg | ORAL_TABLET | Freq: Three times a day (TID) | ORAL | Status: DC | PRN

## 2014-06-24 MED ORDER — NICOTINE 21 MG/24HR TD PT24
21.0000 mg | MEDICATED_PATCH | Freq: Every day | TRANSDERMAL | Status: DC
  Administered 2014-06-25 (×2): 21 mg via TRANSDERMAL
  Filled 2014-06-24 (×2): qty 1

## 2014-06-24 MED ORDER — ALPRAZOLAM 0.5 MG PO TABS: 1.0000 mg | ORAL_TABLET | Freq: Every evening | ORAL | Status: DC | PRN

## 2014-06-24 MED ORDER — VITAMIN B-1 100 MG PO TABS
100.0000 mg | ORAL_TABLET | Freq: Every day | ORAL | Status: DC
  Administered 2014-06-25: 100 mg via ORAL
  Filled 2014-06-24: qty 1

## 2014-06-24 MED ORDER — LORAZEPAM 1 MG PO TABS
0.0000 mg | ORAL_TABLET | Freq: Four times a day (QID) | ORAL | Status: DC
  Administered 2014-06-25: 2 mg via ORAL
  Administered 2014-06-25 (×2): 1 mg via ORAL
  Filled 2014-06-24: qty 2
  Filled 2014-06-24: qty 1
  Filled 2014-06-24 (×2): qty 2

## 2014-06-24 MED ORDER — THIAMINE HCL 100 MG/ML IJ SOLN: 100.0000 mg | Freq: Every day | INTRAMUSCULAR | Status: DC

## 2014-06-24 MED ORDER — ONDANSETRON HCL 4 MG PO TABS
4.0000 mg | ORAL_TABLET | Freq: Three times a day (TID) | ORAL | Status: DC | PRN
  Administered 2014-06-25: 4 mg via ORAL
  Filled 2014-06-24: qty 1

## 2014-06-24 MED ORDER — LORAZEPAM 1 MG PO TABS: 0.0000 mg | ORAL_TABLET | Freq: Two times a day (BID) | ORAL | Status: DC

## 2014-06-24 MED ORDER — LORAZEPAM 1 MG PO TABS: 1.0000 mg | ORAL_TABLET | Freq: Three times a day (TID) | ORAL | Status: DC | PRN

## 2014-06-24 NOTE — ED Notes (Signed)
Pt reports that he recently left an AA group and wants to seek treatment for detox of EtOH and Benzos. Pt reports that he generally drinks "all day" liquor and beer, but has only drank 11 beers today. Pt reports "I have been out of my medications for a few days and I'm really starting to hurt." Pt generally takes 1mg  Xanax and 2mg  Klonopin to help with "cravings" but has recently moved and does not have a PCP to prescribe these. Pt denies AVH at this time, but reports that he has had hallucinations in the past while in [place]. Pt denies SI/HI at this time as well.  Pt calm and cooperative while in triage.
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
## 2014-06-25 ENCOUNTER — Observation Stay (HOSPITAL_COMMUNITY)
Admission: AD | Admit: 2014-06-25 | Discharge: 2014-06-26 | Disposition: A | Discharge status: Home / Self Care | Payer: Federal, State, Local not specified - Other | Source: Intra-hospital | Attending: Psychiatry | Admitting: Psychiatry

## 2014-06-25 ENCOUNTER — Encounter (HOSPITAL_COMMUNITY): Payer: Self-pay | Admitting: *Deleted

## 2014-06-25 DIAGNOSIS — F17210 Nicotine dependence, cigarettes, uncomplicated: Secondary | ICD-10-CM | POA: Insufficient documentation

## 2014-06-25 DIAGNOSIS — F1290 Cannabis use, unspecified, uncomplicated: Secondary | ICD-10-CM | POA: Insufficient documentation

## 2014-06-25 DIAGNOSIS — F419 Anxiety disorder, unspecified: Secondary | ICD-10-CM | POA: Insufficient documentation

## 2014-06-25 DIAGNOSIS — F1020 Alcohol dependence, uncomplicated: Secondary | ICD-10-CM | POA: Diagnosis present

## 2014-06-25 DIAGNOSIS — G4700 Insomnia, unspecified: Secondary | ICD-10-CM | POA: Insufficient documentation

## 2014-06-25 DIAGNOSIS — F10239 Alcohol dependence with withdrawal, unspecified: Principal | ICD-10-CM | POA: Insufficient documentation

## 2014-06-25 DIAGNOSIS — Z885 Allergy status to narcotic agent status: Secondary | ICD-10-CM | POA: Insufficient documentation

## 2014-06-25 LAB — RAPID URINE DRUG SCREEN, HOSP PERFORMED
Amphetamines: NOT DETECTED
BARBITURATES: NOT DETECTED
Benzodiazepines: POSITIVE — AB
Cocaine: NOT DETECTED
Opiates: NOT DETECTED
Tetrahydrocannabinol: NOT DETECTED

## 2014-06-25 LAB — ETHANOL: Alcohol, Ethyl (B): <5 mg/dL (ref 0–9)

## 2014-06-25 MED ORDER — THIAMINE HCL 100 MG/ML IJ SOLN: 100.0000 mg | Freq: Every day | INTRAMUSCULAR | Status: DC

## 2014-06-25 MED ORDER — LORAZEPAM 1 MG PO TABS: 0.0000 mg | ORAL_TABLET | Freq: Two times a day (BID) | ORAL | Status: DC

## 2014-06-25 MED ORDER — ALUM & MAG HYDROXIDE-SIMETH 200-200-20 MG/5ML PO SUSP: 30.0000 mL | ORAL | Status: DC | PRN

## 2014-06-25 MED ORDER — LORAZEPAM 1 MG PO TABS
0.0000 mg | ORAL_TABLET | Freq: Four times a day (QID) | ORAL | Status: DC
  Administered 2014-06-25 – 2014-06-26 (×3): 1 mg via ORAL
  Filled 2014-06-25 (×3): qty 1

## 2014-06-25 MED ORDER — IBUPROFEN 600 MG PO TABS: 600.0000 mg | ORAL_TABLET | Freq: Three times a day (TID) | ORAL | Status: DC | PRN

## 2014-06-25 MED ORDER — VITAMIN B-1 100 MG PO TABS
100.0000 mg | ORAL_TABLET | Freq: Every day | ORAL | Status: DC
  Filled 2014-06-25 (×2): qty 1

## 2014-06-25 MED ORDER — ONDANSETRON HCL 4 MG PO TABS: 4.0000 mg | ORAL_TABLET | Freq: Three times a day (TID) | ORAL | Status: DC | PRN

## 2014-06-25 MED ORDER — NICOTINE 21 MG/24HR TD PT24
21.0000 mg | MEDICATED_PATCH | Freq: Every day | TRANSDERMAL | Status: DC
  Administered 2014-06-26: 21 mg via TRANSDERMAL
  Filled 2014-06-25 (×3): qty 1

## 2014-06-25 MED ORDER — ZOLPIDEM TARTRATE 5 MG PO TABS
5.0000 mg | ORAL_TABLET | Freq: Every evening | ORAL | Status: DC | PRN
  Administered 2014-06-25: 5 mg via ORAL
  Filled 2014-06-25: qty 1

## 2014-06-25 NOTE — BH Assessment (Signed)
Tele Assessment Note   Larry Adams is a 28 y.o. male who [facility], benzos, THC detox.  Pt denies SI/HI/AVH.  Pt reports the following: pt consumes 1 gal or 1 pt of liquor, daily.  His last drink was 06/24/14, he drank 3 tall beer, 5 shots and 2 mixed drinks.  Pt states that he smokes 1 joint sporadically, his last use was christmas eve and christmas day, he smoked 1 bowl.  Pt also abuses benzos--stating that he uses approx 7 pills, combinations of Xanax and Klonopin.  Pt states his last use was 3-4 days ago.  Pt denies current legal issues. He does not have alcohol induced seizures but endorses blackouts.  He denies current w/d sxs.  Pt states that while in treatment in [place]([facility]) he had hallucinations.    Axis I: Alcohol use disorder, Severe; Cannabis use disorder, Mild; Sedative, hypnotic, or anxiolytic use disorder, Severe Axis II: Deferred Axis III:  Past Medical History  Diagnosis Date  . Back pain   . Anxiety   . Mental disorder    Axis IV: other psychosocial or environmental problems, problems related to social environment and problems with primary support group Axis V: 41-50 serious symptoms  Past Medical History:  Past Medical History  Diagnosis Date  . Back pain   . Anxiety   . Mental disorder     Past Surgical History  Procedure Laterality Date  . Hernia repair      Family History: History reviewed. No pertinent family history.  Social History:  reports that he has been smoking Cigarettes.  He has been smoking about 0.50 packs per day. He has quit using smokeless tobacco. He reports that he drinks alcohol. He reports that he uses illicit drugs (Marijuana and Benzodiazepines) about 5 times per week.  Additional Social History:  Alcohol / Drug Use Pain Medications: See MAR  Prescriptions: See MAR  Over the Counter: See MAR  History of alcohol / drug use?: Yes Longest period of sobriety (when/how long): Only when in detox   Negative Consequences of Use: Work / Programmer, multimediachool, Copywriter, advertisingersonal relationships, Surveyor, quantityinancial Withdrawal Symptoms: Other (Comment) (No current w/d sxs ) Substance #1 Name of Substance 1: Alcohol  1 - Age of First Use: 16 YOM  1 - Amount (size/oz): 1/2 Gal or 1 pint of liquor  1 - Frequency: Daily  1 - Duration: On-going  1 - Last Use / Amount: 06/24/14 Substance #2 Name of Substance 2: THC  2 - Age of First Use: 17 YOM  2 - Amount (size/oz): 1 Joint  2 - Frequency: Sporadic  2 - Duration: On-going  2 - Last Use / Amount: 06/22/14 Substance #3 Name of Substance 3: Benzos(Xanax & Klonopin)  3 - Age of First Use: 20 YOM  3 - Amount (size/oz): 7 Pills  3 - Frequency: Daily  3 - Duration: On-going  3 - Last Use / Amount: 3-4 Days Ago  CIWA: CIWA-Ar BP: 127/81 mmHg Pulse Rate: 99 Nausea and Vomiting: mild nausea with no vomiting Tactile Disturbances: moderate itching, pins and needles, burning or numbness Tremor: three Auditory Disturbances: not present Paroxysmal Sweats: no sweat visible Visual Disturbances: very mild sensitivity Anxiety: three Headache, Fullness in Head: none present Agitation: normal activity Orientation and Clouding of Sensorium: oriented and can do serial additions CIWA-Ar Total: 11 COWS:    PATIENT STRENGTHS: (choose at least two) Motivation for treatment/growth  Allergies:  Allergies  Allergen Reactions  . Morphine And Related Hives  Home Medications:  (Not in a hospital admission)  OB/GYN Status:  No LMP for male patient.  General Assessment Data Location of Assessment: [facility] Is this a Tele or Face-to-Face Assessment?: Tele Assessment Is this an Initial Assessment or a Re-assessment for this encounter?: Initial Assessment Living Arrangements: Alone Can pt return to current living arrangement?: Yes Admission Status: Voluntary Is patient capable of signing voluntary admission?: Yes Transfer from: Home Referral Source: Self/Family/Friend  Medical  Screening Exam [facility] Walk-in ONLY) Medical Exam completed: No Reason for MSE not completed: Other: (None )  [facility] Crisis Care Plan Living Arrangements: Alone Name of Psychiatrist: None  Name of Therapist: None   Education Status Is patient currently in school?: No Current Grade: None  Highest grade of school patient has completed: None  Name of school: None  Contact person: None   Risk to self with the past 6 months Suicidal Ideation: No Suicidal Intent: No Is patient at risk for suicide?: No Suicidal Plan?: No Access to Means: No What has been your use of drugs/alcohol within the last 12 months?: Abusing: alcohol, benzos, thc  Previous Attempts/Gestures: No How many times?: 0 Other Self Harm Risks: None  Triggers for Past Attempts: None known Intentional Self Injurious Behavior: None Family Suicide History: No Recent stressful life event(s): Other (Comment) (SA ) Persecutory voices/beliefs?: No Depression: Yes Depression Symptoms: Loss of interest in usual pleasures Substance abuse history and/or treatment for substance abuse?: No Suicide prevention information given to non-admitted patients: Not applicable  Risk to Others within the past 6 months Homicidal Ideation: No Thoughts of Harm to Others: No Current Homicidal Intent: No Current Homicidal Plan: No Access to Homicidal Means: No Identified Victim: None  History of harm to others?: No Assessment of Violence: None Noted Violent Behavior Description: None  Does patient have access to weapons?: No Criminal Charges Pending?: No Does patient have a court date: No  Psychosis Hallucinations: None noted Delusions: None noted  Mental Status Report Appear/Hygiene: Disheveled, In scrubs Eye Contact: Fair Motor Activity: Unremarkable Speech: Logical/coherent Level of Consciousness: Alert Mood: Anxious Affect: Anxious, Appropriate to circumstance Anxiety Level: Minimal Thought Processes: Coherent,  Relevant Judgement: Unimpaired Orientation: Person, Place, Time, Situation Obsessive Compulsive Thoughts/Behaviors: None  Cognitive Functioning Concentration: Normal Memory: Recent Intact, Remote Intact IQ: Average Insight: Fair Impulse Control: Good Appetite: Good Weight Loss: 0 Weight Gain: 0 Sleep: No Change Total Hours of Sleep: 5 Vegetative Symptoms: None  ADLScreening [facility] Assessment Services) Patient's cognitive ability adequate to safely complete daily activities?: Yes Patient able to express need for assistance with ADLs?: Yes Independently performs ADLs?: Yes (appropriate for developmental age)  Prior Inpatient Therapy Prior Inpatient Therapy: Yes Prior Therapy Dates: 2005,2013,2015 Prior Therapy Facilty/Provider(s): [facility], [facility], [facility]  Reason for Treatment: Detox/Rehab   Prior Outpatient Therapy Prior Outpatient Therapy: No Prior Therapy Dates: None  Prior Therapy Facilty/Provider(s): None  Reason for Treatment: None   ADL Screening (condition at time of admission) Patient's cognitive ability adequate to safely complete daily activities?: Yes Is the patient deaf or have difficulty hearing?: No Does the patient have difficulty seeing, even when wearing glasses/contacts?: No Does the patient have difficulty concentrating, remembering, or making decisions?: No Patient able to express need for assistance with ADLs?: Yes Does the patient have difficulty dressing or bathing?: No Independently performs ADLs?: Yes (appropriate for developmental age) Does the patient have difficulty walking or climbing stairs?: No Weakness of Legs: None Weakness of Arms/Hands: None  Home Assistive Devices/Equipment Home Assistive Devices/Equipment: None  Therapy Consults (therapy consults require a physician order) PT Evaluation Needed: No OT Evalulation Needed: No SLP Evaluation Needed: No Abuse/Neglect Assessment (Assessment to be complete while patient is  alone) Physical Abuse: Denies Verbal Abuse: Denies Sexual Abuse: Denies Exploitation of patient/patient's resources: Denies Self-Neglect: Denies Values / Beliefs Cultural Requests During Hospitalization: None Spiritual Requests During Hospitalization: None Consults Spiritual Care Consult Needed: No Social Work Consult Needed: No Merchant navy officer (For Healthcare) Does patient have an advance directive?: No Would patient like information on creating an advanced directive?: No - patient declined information    Additional Information 1:1 In Past 12 Months?: No CIRT Risk: No Elopement Risk: No Does patient have medical clearance?: Yes     Disposition:  Disposition Initial Assessment Completed for this Encounter: Yes Disposition of Patient:  (Per Maryjean Morn, PA; recommend inpt admission )  Murrell Redden 06/25/2014 1:07 AM

## 2014-06-25 NOTE — BH Assessment (Signed)
[facility] Assessment Progress Note  Pt has been accepted to Observation bed 2 by Carolanne Grumbling, MD.  Pt signed Voluntary Admission and Consent for Treatment, as well as Consent to Release Information.  Original forms have been faxed to [facility].  Pt's nurse has been notified; she agrees to send original paperwork to [facility] with pt via Juel Burrow, and to call report to [phone] or [phone].  Doylene Canning, MA Triage Specialist 06/25/2014 @ 12:33

## 2014-06-25 NOTE — ED Notes (Signed)
Patient denies pain and is resting comfortably.  

## 2014-06-25 NOTE — ED Notes (Signed)
Report given to Nurse at [facility]. [facility] called for pick up.

## 2014-06-25 NOTE — Progress Notes (Signed)
Patient stable at discharge. [facility] transported patient to [facility].

## 2014-06-25 NOTE — Consult Note (Signed)
Review of Systems   Constitutional: Negative.    HENT: Negative.    Eyes: Negative.    Respiratory: Negative.    Cardiovascular: Negative.    Gastrointestinal: Negative.    Genitourinary: Negative.    Musculoskeletal: Negative.    Skin: Negative.    Neurological: Negative.    Endo/Heme/Allergies: Negative.    Psychiatric/Behavioral: Positive for substance abuse.

## 2014-06-25 NOTE — ED Notes (Signed)
Patient is resting comfortably. 

## 2014-06-25 NOTE — ED Notes (Signed)
Bed: WA31 Expected date:  Expected time:  Means of arrival:  Comments: 

## 2014-06-25 NOTE — Consult Note (Signed)
[facility] Consult   Reason for Consult:  Requesting detox from alcohol Referring Physician:  ED MD  Larry Adams is an 28 y.o. male. Total Time spent with patient: 45 minutes  Assessment: AXIS I:  alcohol dependence AXIS II:  Deferred AXIS III:   Past Medical History  Diagnosis Date  . Back pain   . Anxiety   . Mental disorder    AXIS IV:  dependence problems AXIS V:  51-60 moderate symptoms  Plan:  No evidence of imminent risk to self or others at present.    Subjective:   Larry Adams is a 28 y.o. male patient admitted with daily alcohol intake.  HPI:  [name] says he was in a rehab program in [place] but was discharged after detox as his insurance would not cover rehab before he had tried outpatient programs first.  He then moved to [place] where he had lived before to look for a job.  Drinking has resumed to the point of a pint of alcohol daily and he is requesting detox.  He denies any suicidal, homicidal or psychotic thoughts. He has a history of black outs but no withdrawal seizures. HPI Elements:   Location:  alcohol addiction. Quality:  daily drinking. Severity:  a pint of alcohol daily. Timing:  just out of detox and drinking again. Duration:  years. Context:  as above.  Past Psychiatric History: Past Medical History  Diagnosis Date  . Back pain   . Anxiety   . Mental disorder     reports that he has been smoking Cigarettes.  He has been smoking about 0.50 packs per day. He has quit using smokeless tobacco. He reports that he drinks alcohol. He reports that he uses illicit drugs (Marijuana and Benzodiazepines) about 5 times per week. History reviewed. No pertinent family history. Family History Substance Abuse: No Family Supports: No Living Arrangements: Alone Can pt return to current living arrangement?: Yes Abuse/Neglect [facility]) Physical Abuse: Denies Verbal Abuse: Denies Sexual Abuse: Denies Allergies:   Allergies  Allergen  Reactions  . Morphine And Related Hives    ACT Assessment Complete:  Yes:    Educational Status    Risk to Self: Risk to self with the past 6 months Suicidal Ideation: No Suicidal Intent: No Is patient at risk for suicide?: No Suicidal Plan?: No Access to Means: No What has been your use of drugs/alcohol within the last 12 months?: Abusing: alcohol, benzos, thc  Previous Attempts/Gestures: No How many times?: 0 Other Self Harm Risks: None  Triggers for Past Attempts: None known Intentional Self Injurious Behavior: None Family Suicide History: No Recent stressful life event(s): Other (Comment) (SA ) Persecutory voices/beliefs?: No Depression: Yes Depression Symptoms: Loss of interest in usual pleasures Substance abuse history and/or treatment for substance abuse?: No Suicide prevention information given to non-admitted patients: Not applicable  Risk to Others: Risk to Others within the past 6 months Homicidal Ideation: No Thoughts of Harm to Others: No Current Homicidal Intent: No Current Homicidal Plan: No Access to Homicidal Means: No Identified Victim: None  History of harm to others?: No Assessment of Violence: None Noted Violent Behavior Description: None  Does patient have access to weapons?: No Criminal Charges Pending?: No Does patient have a court date: No  Abuse: Abuse/Neglect Assessment (Assessment to be complete while patient is alone) Physical Abuse: Denies Verbal Abuse: Denies Sexual Abuse: Denies Exploitation of patient/patient's resources: Denies Self-Neglect: Denies  Prior Inpatient Therapy: Prior Inpatient Therapy Prior Inpatient Therapy: Yes  Prior Therapy Dates: 2005,2013,2015 Prior Therapy Facilty/Provider(s): [facility], [facility], [facility]  Reason for Treatment: Detox/Rehab   Prior Outpatient Therapy: Prior Outpatient Therapy Prior Outpatient Therapy: No Prior Therapy Dates: None  Prior Therapy Facilty/Provider(s): None  Reason for Treatment: None    Additional Information: Additional Information 1:1 In Past 12 Months?: No CIRT Risk: No Elopement Risk: No Does patient have medical clearance?: Yes                  Objective: Blood pressure 123/70, pulse 83, temperature 97.7 F (36.5 C), temperature source Oral, resp. rate 16, height 6' (1.829 m), weight 127.007 kg (280 lb), SpO2 98 %.Body mass index is 37.97 kg/(m^2). Results for orders placed or performed during the hospital encounter of 06/24/14 (from the past 72 hour(s))  Acetaminophen level     Status: Abnormal   Collection Time: 06/24/14 10:02 PM  Result Value Ref Range   Acetaminophen (Tylenol), Serum <10.0 (L) 10 - 30 ug/mL    Comment:        THERAPEUTIC CONCENTRATIONS VARY SIGNIFICANTLY. A RANGE OF 10-30 ug/mL MAY BE AN EFFECTIVE CONCENTRATION FOR MANY PATIENTS. HOWEVER, SOME ARE BEST TREATED AT CONCENTRATIONS OUTSIDE THIS RANGE. ACETAMINOPHEN CONCENTRATIONS >150 ug/mL AT 4 HOURS AFTER INGESTION AND >50 ug/mL AT 12 HOURS AFTER INGESTION ARE OFTEN ASSOCIATED WITH TOXIC REACTIONS.   CBC     Status: Abnormal   Collection Time: 06/24/14 10:02 PM  Result Value Ref Range   WBC 11.0 (H) 4.0 - 10.5 K/uL   RBC 5.21 4.22 - 5.81 MIL/uL   Hemoglobin 17.0 13.0 - 17.0 g/dL   HCT 50.2 39.0 - 52.0 %   MCV 96.4 78.0 - 100.0 fL   MCH 32.6 26.0 - 34.0 pg   MCHC 33.9 30.0 - 36.0 g/dL   RDW 13.4 11.5 - 15.5 %   Platelets 221 150 - 400 K/uL  Comprehensive metabolic panel     Status: Abnormal   Collection Time: 06/24/14 10:02 PM  Result Value Ref Range   Sodium 142 135 - 145 mmol/L    Comment: Please note change in reference range.   Potassium 3.7 3.5 - 5.1 mmol/L    Comment: Please note change in reference range.   Chloride 107 96 - 112 mEq/L   CO2 29 19 - 32 mmol/L   Glucose, Bld 99 70 - 99 mg/dL   BUN <5 (L) 6 - 23 mg/dL   Creatinine, Ser 1.06 0.50 - 1.35 mg/dL   Calcium 8.9 8.4 - 10.5 mg/dL   Total Protein 7.3 6.0 - 8.3 g/dL   Albumin 4.0 3.5 - 5.2 g/dL    AST 67 (H) 0 - 37 U/L   ALT 48 0 - 53 U/L   Alkaline Phosphatase 77 39 - 117 U/L   Total Bilirubin 1.4 (H) 0.3 - 1.2 mg/dL   GFR calc non Af Amer >90 >90 mL/min   GFR calc Af Amer >90 >90 mL/min    Comment: (NOTE) The eGFR has been calculated using the CKD EPI equation. This calculation has not been validated in all clinical situations. eGFR's persistently <90 mL/min signify possible Chronic Kidney Disease.    Anion gap 6 5 - 15  Ethanol (ETOH)     Status: Abnormal   Collection Time: 06/24/14 10:02 PM  Result Value Ref Range   Alcohol, Ethyl (B) 203 (H) 0 - 9 mg/dL    Comment:        LOWEST DETECTABLE LIMIT FOR SERUM ALCOHOL IS 11 mg/dL FOR  MEDICAL PURPOSES ONLY   Salicylate level     Status: None   Collection Time: 06/24/14 10:02 PM  Result Value Ref Range   Salicylate Lvl <2.7 2.8 - 20.0 mg/dL  Ethanol     Status: None   Collection Time: 06/25/14  9:05 AM  Result Value Ref Range   Alcohol, Ethyl (B) <5 0 - 9 mg/dL    Comment:        LOWEST DETECTABLE LIMIT FOR SERUM ALCOHOL IS 11 mg/dL FOR MEDICAL PURPOSES ONLY    Labs are reviewed and are pertinent for initial BAL increased but currently none.  Current Facility-Administered Medications  Medication Dose Route Frequency Provider Last Rate Last Dose  . alum & mag hydroxide-simeth (MAALOX/MYLANTA) 200-200-20 MG/5ML suspension 30 mL  30 mL Oral PRN Clayton Bibles, PA-C      . ibuprofen (ADVIL,MOTRIN) tablet 600 mg  600 mg Oral Q8H PRN Clayton Bibles, PA-C      . LORazepam (ATIVAN) tablet 0-4 mg  0-4 mg Oral 4 times per day Clayton Bibles, PA-C   1 mg at 06/25/14 7824   Followed by  . [START ON 06/27/2014] LORazepam (ATIVAN) tablet 0-4 mg  0-4 mg Oral Q12H Emily West, PA-C      . nicotine (NICODERM CQ - dosed in mg/24 hours) patch 21 mg  21 mg Transdermal Daily Emily West, PA-C   21 mg at 06/25/14 1013  . ondansetron (ZOFRAN) tablet 4 mg  4 mg Oral Q8H PRN Clayton Bibles, PA-C   4 mg at 06/25/14 2353  . thiamine (VITAMIN B-1) tablet  100 mg  100 mg Oral Daily Emily West, PA-C   100 mg at 06/25/14 1013   Or  . thiamine (B-1) injection 100 mg  100 mg Intravenous Daily Emily West, PA-C      . zolpidem (AMBIEN) tablet 5 mg  5 mg Oral QHS PRN Clayton Bibles, PA-C   5 mg at 06/25/14 0206   Current Outpatient Prescriptions  Medication Sig Dispense Refill  . ALPRAZolam (XANAX) 1 MG tablet Take 1 mg by mouth at bedtime as needed for anxiety.    . clonazePAM (KLONOPIN) 2 MG tablet Take 2 mg by mouth 2 (two) times daily.    Marland Kitchen LORazepam (ATIVAN) 1 MG tablet Take 0.5 tablets (0.5 mg total) by mouth 3 (three) times daily as needed for anxiety. (Patient not taking: Reported on 06/24/2014) 8 tablet 0    Psychiatric Specialty Exam: Physical Exam  ROS  Blood pressure 123/70, pulse 83, temperature 97.7 F (36.5 C), temperature source Oral, resp. rate 16, height 6' (1.829 m), weight 127.007 kg (280 lb), SpO2 98 %.Body mass index is 37.97 kg/(m^2).  General Appearance: Casual  Eye Contact::  Good  Speech:  Clear and Coherent  Volume:  Normal  Mood:  Anxious  Affect:  Appropriate  Thought Process:  Coherent and Logical  Orientation:  Full (Time, Place, and Person)  Thought Content:  Negative  Suicidal Thoughts:  No  Homicidal Thoughts:  No  Memory:  Immediate;   Good Recent;   Good Remote;   Good  Judgement:  Intact  Insight:  Fair  Psychomotor Activity:  Normal  Concentration:  Good  Recall:  Good  Fund of Knowledge:Good  Language: Good  Akathisia:  Negative  Handed:  Right  AIMS (if indicated):     Assets:  Communication Skills Desire for Improvement Housing Physical Health Talents/Skills  Sleep:      Musculoskeletal: Strength & Muscle Tone: within normal limits  Gait & Station: normal Patient leans: N/A  Treatment Plan Summary: Daily contact with patient to assess and evaluate symptoms and progress in treatment refer for Observation bed to make sure safe detox   TAYLOR,GERALD D 06/25/2014 11:18 AM

## 2014-06-25 NOTE — Plan of Care (Signed)
[facility] Observation Crisis Plan  Reason for Crisis Plan:  Crisis Stabilization   Plan of Care:  Referral for IOP  Family Support:      Current Living Environment:  Living Arrangements: Non-relatives/Friends  Insurance:   Hospital Account    Name Acct ID Class Status Primary Coverage   [place], [place] [id number] BEHAVIORAL HEALTH OBSERVATION Open None        Guarantor Account (for Hospital Account [id number])    Name Relation to Pt Service Area Active? Acct Type   [name] Self [facility] Yes [facility]   Address Phone       [address]  [place], [place] [address] [phone](H)          Coverage Information (for Hospital Account [id number])    Not on file      Legal Guardian:     Primary Care Provider:  No primary care provider on file.  Current Outpatient Providers:  unknown  Psychiatrist:     Counselor/Therapist:     Compliant with Medications:  Yes  Additional Information:   Loren Racer 12/28/20154:51 PM

## 2014-06-25 NOTE — BH Assessment (Signed)
Binnie Rail, [facility] at [facility], confirms adult unit is currently at capacity. Contacted the following facilities for placement:  BED AVAILABLE, FAXED CLINICAL INFORMATION: [facility], per Burnard Hawthorne, per [facility], per Kara Pacer, [facility], [facility] Triage Specialist [phone]

## 2014-06-25 NOTE — [facility] Group Notes (Signed)
[facility] INPATIENT:  Family/Significant Other Suicide Prevention Education  Suicide Prevention Education:  Patient Refusal for Family/Significant Other Suicide Prevention Education: The patient Larry Adams has refused to provide written consent for family/significant other to be provided Family/Significant Other Suicide Prevention Education during admission and/or prior to discharge.  Physician notified.  Loren Racer 06/25/2014, 4:53 PM

## 2014-06-25 NOTE — Progress Notes (Signed)
Pt alert and cooperative.  -SI/HI, verbally contracts for safety. -A/[place]. Denies pain or discomfort. Emotional support and encouragement given. Will continue to monitor closely and evaluate for stabilization.

## 2014-06-25 NOTE — Progress Notes (Signed)
Patient ID: Larry Adams, male   DOB: 23-Oct-1985, 28 y.o.   MRN: [id number] Nusring admission note:  Patient is a 28 y.o. Male who presented to [facility] for alcohol, benzo and THC detox.  Patient reports he consume 1 gallon or pint daily.  His last drink was 06/24/14 in which he states he was watching a ballgame with a friend.  He states, "I don't remember how much I drank, but it was a lot and things got out of control."  He also reports smoking THC occasionally.  Patient also states he uses klonopin and xanax daily.  Patient has also had prior substance abuse at a facility in [place] ([facility]) in which he stayed 11 days.  Patient denies any SI/HI/AVH.  He states that he would like to attend IOP sessions here.  He denies any current withdrawal symptoms.  Patient has no pertinent medical hx.

## 2014-06-26 DIAGNOSIS — F10239 Alcohol dependence with withdrawal, unspecified: Secondary | ICD-10-CM

## 2014-06-26 MED ORDER — LOPERAMIDE HCL 2 MG PO CAPS: 2.0000 mg | ORAL_CAPSULE | ORAL | Status: DC | PRN

## 2014-06-26 MED ORDER — HYDROXYZINE HCL 25 MG PO TABS: 25.0000 mg | ORAL_TABLET | Freq: Four times a day (QID) | ORAL | Status: DC | PRN

## 2014-06-26 MED ORDER — HYDROXYZINE HCL 25 MG PO TABS: 50.0000 mg | ORAL_TABLET | Freq: Four times a day (QID) | ORAL | Status: DC | PRN

## 2014-06-26 MED ORDER — LORAZEPAM 1 MG PO TABS: 1.0000 mg | ORAL_TABLET | Freq: Two times a day (BID) | ORAL | Status: DC

## 2014-06-26 MED ORDER — LORAZEPAM 1 MG PO TABS: 1.0000 mg | ORAL_TABLET | Freq: Three times a day (TID) | ORAL | Status: DC

## 2014-06-26 MED ORDER — ADULT MULTIVITAMIN W/MINERALS CH: 1.0000 | ORAL_TABLET | Freq: Every day | ORAL | Status: DC

## 2014-06-26 MED ORDER — ADULT MULTIVITAMIN W/MINERALS CH
1.0000 | ORAL_TABLET | Freq: Every day | ORAL | Status: DC
  Administered 2014-06-26: 1 via ORAL
  Filled 2014-06-26 (×3): qty 1

## 2014-06-26 MED ORDER — LORAZEPAM 1 MG PO TABS: 1.0000 mg | ORAL_TABLET | Freq: Every day | ORAL | Status: DC

## 2014-06-26 MED ORDER — LORAZEPAM 1 MG PO TABS
1.0000 mg | ORAL_TABLET | Freq: Four times a day (QID) | ORAL | Status: DC
  Administered 2014-06-26: 1 mg via ORAL
  Filled 2014-06-26: qty 1

## 2014-06-26 MED ORDER — MIRTAZAPINE 7.5 MG PO TABS: 7.5000 mg | ORAL_TABLET | Freq: Every day | ORAL | Status: DC

## 2014-06-26 MED ORDER — ONDANSETRON 4 MG PO TBDP: 4.0000 mg | ORAL_TABLET | Freq: Four times a day (QID) | ORAL | Status: DC | PRN

## 2014-06-26 MED ORDER — VITAMIN B-1 100 MG PO TABS
100.0000 mg | ORAL_TABLET | Freq: Every day | ORAL | Status: DC
  Administered 2014-06-26: 100 mg via ORAL
  Filled 2014-06-26 (×3): qty 1

## 2014-06-26 NOTE — Progress Notes (Signed)
Patient ID: Larry Adams, male   DOB: March 10, 1986, 28 y.o.   MRN: [id number]  D: Patient denies any SI. Looking for outpatient substance abuse treatment. Patient given phone number to follow up with [facility] outpatient IOP. A: Obtained all belongings and follow-up phone numbers and prescriptions. R: Given bus pass as walking out the door.
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
## 2014-06-26 NOTE — Progress Notes (Signed)
Patient ID: Larry Adams, male   DOB: [birth year], 28 y.o.   MRN: [id number]  D: Patient has a flat affect on approach. Reports minimal withdrawal symptoms at this time but had his ativan earlier today. Ate breakfast. No complaints. Contracts for safety. Will continue on the ativan protocol. A: Staff will monitor in OBS unit and follow medication orders R: Cooperative on the unit. Took all am medications.

## 2014-06-26 NOTE — Discharge Instructions (Signed)
Please call [facility] [phone] to begin Intensive Outpatient Therapy.

## 2014-06-26 NOTE — Discharge Summary (Signed)
Physician Discharge Summary Note  Patient:  Larry Adams is an 28 y.o., male MRN:  [id number] DOB:  November 28, 1985 Patient phone:  [phone] (home)  Patient address:   [address] [place] [address],  Total Time spent with patient: 30 minutes  Date of Admission:  06/25/2014 Date of Discharge: 12.29/2015  Reason for Admission:   [name], is a 28 y.o. Caucasian male who presented voluntarily to the [facility] requesting detoxification help from alcohol benzodiazepeines and THC. Patient reported that he consumes 1 gal or 1 pt of liquor, daily. His last drink was 06/24/14, he drank 3 tall beer, 5 shots and 2 mixed drinks. Pt states that he smokes 1 joint sporadically, his last use was christmas eve and christmas day, he smoked 1 bowl. Pt also abuses benzos--stating that he uses approx 7 pills, combinations of Xanax and Klonopin. Pt states his last use was 3-4 days ago. Pt denies current legal issues. He does not have alcohol induced seizures but endorses blackouts. He denies current w/d sxs. But was odiferous of ETOH despite last use being 06/24/2014. And blood alcohol level on the 28 being zero. It is questionable if patient Has been drinking hand sanitizer as continues to be odiferous of alcohol despite denied use. Patient stated this am he had been in an inpatient residential program in [place] where he had a "bad detox" Including hallucinations. Patient states he was asked to leave the program on November 22 as they would not take his insurance. Patient reports that the holidays were a significant trigger for him with him beginning to binge drink. 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 Patient denies suicidal ideation, homicidal ideation, or auditory or visual hallucinations.  Patient has been calm and cooperative, Thought processes are logical and goal directed. Affect is  euthymic. Patient reports wanting help and is open to IOP once completed with Detox.   Discharge Diagnoses: Active Problems:   Alcohol dependence   Psychiatric Specialty Exam: Physical Exam  ROS  Blood pressure 112/77, pulse 89, temperature 98.4 F (36.9 C), temperature source Oral, resp. rate 18, height 6' (1.829 m), weight 127.007 kg (280 lb), SpO2 98 %.Body mass index is 37.97 kg/(m^2).   General Appearance: Casual and Fairly Groomed  Eye Contact:: Good  Speech: Clear and Coherent and Normal Rate  Volume: Normal  Mood: Euthymic  Affect: Congruent  Thought Process: Coherent, Goal Directed and Logical  Orientation: Full (Time, Place, and Person)  Thought Content: WDL  Suicidal Thoughts: No  Homicidal Thoughts: No  Memory: Immediate; Good Recent; Good Remote; Good  Judgement: Fair  Insight: Fair  Psychomotor Activity: Normal  Concentration: Good  Recall: Good  Fund of Knowledge:Good  Language: Good  Akathisia: No  Handed: Right  AIMS (if indicated):    Assets: Communication Skills Desire for Improvement Physical Health Social Support  Sleep:      Musculoskeletal: Strength & Muscle Tone: within normal limits Gait & Station: normal Patient leans: N/A  Past Psychiatric History: Diagnosis: denies prior history   Hospitalizations:  Outpatient Care:  Substance Abuse Care: Residential placement [place] ended 11/22  Self-Mutilation:  Suicidal Attempts: denies  Violent Behaviors:denies        DSM5:  Alcohol dependence severe in withdrawal uncomplicated  SSubstance/Addictive Disorders:  Alcohol Related Disorder - Severe (303.90) and Alcohol Withdrawal without Perceptual Disturbances (F10.239) Depressive Disorders:  None Axis Diagnosis:   AXIS I:  Alcohol Abuse AXIS II:  Deferred  AXIS III:   Past Medical History  Diagnosis Date  . Back pain   . Anxiety   . Mental disorder    AXIS IV:  other  psychosocial or environmental problems, problems with access to health care services and problems with primary support group AXIS V:  61-70 mild symptoms  Level of Care:  OP  Hospital Course:  Patient was admitted to the behavioral observation unit on 06/25/2014  At the time of admission he was placed on the Ativan detox protocol.  He was observed continuously for his safety with regular assessments of his vital signs and for withdrawal signs and symptoms.  His safety was maintained while in the unit milieu and he has been calm and cooperative since his arrival to the observation unit.  CIWA scores have gradually decreased from a high of 5 to a current level of 2.  Vital signs have remained unremarkable. Patient was provided a list of outpatient resources for his substance recovery needs.  Referral made to Behavioral health IOP program.  Patient has IOP number to call and will arrange intake  appointment.     Consults:  psychiatry  Significant Diagnostic Studies:  labs: reviewed and were pertinent for medical issues being treated.   Discharge Vitals:   Blood pressure 112/77, pulse 89, temperature 98.4 F (36.9 C), temperature source Oral, resp. rate 18, height 6' (1.829 m), weight 127.007 kg (280 lb), SpO2 98 %. Body mass index is 37.97 kg/(m^2). Lab Results:   Results for orders placed or performed during the hospital encounter of 06/25/14 (from the past 72 hour(s))  Urine Drug Screen     Status: Abnormal   Collection Time: 06/25/14  5:11 PM  Result Value Ref Range   Opiates NONE DETECTED NONE DETECTED   Cocaine NONE DETECTED NONE DETECTED   Benzodiazepines POSITIVE (A) NONE DETECTED   Amphetamines NONE DETECTED NONE DETECTED   Tetrahydrocannabinol NONE DETECTED NONE DETECTED   Barbiturates NONE DETECTED NONE DETECTED    Comment:        DRUG SCREEN FOR MEDICAL PURPOSES ONLY.  IF CONFIRMATION IS NEEDED FOR ANY PURPOSE, NOTIFY LAB WITHIN 5 DAYS.        LOWEST DETECTABLE LIMITS FOR  URINE DRUG SCREEN Drug Class       Cutoff (ng/mL) Amphetamine      1000 Barbiturate      200 Benzodiazepine   200 Tricyclics       300 Opiates          300 Cocaine          300 THC              50 Performed at [facility]     Physical Findings: AIMS: Facial and Oral Movements Muscles of Facial Expression: None, normal Lips and Perioral Area: None, normal Jaw: None, normal Tongue: None, normal,Extremity Movements Upper (arms, wrists, hands, fingers): None, normal Lower (legs, knees, ankles, toes): None, normal, Trunk Movements Neck, shoulders, hips: None, normal, Overall Severity Severity of abnormal movements (highest score from questions above): None, normal Incapacitation due to abnormal movements: None, normal Patient's awareness of abnormal movements (rate only patient's report): No Awareness, Dental Status Current problems with teeth and/or dentures?: No Does patient usually wear dentures?: No  CIWA:  CIWA-Ar Total: 2 COWS:     Psychiatric Specialty Exam: See Psychiatric Specialty Exam and Suicide Risk Assessment completed by Attending Physician prior to discharge.  Discharge destination:  Home  Is patient on multiple antipsychotic therapies  at discharge:  No   Has Patient had three or more failed trials of antipsychotic monotherapy by history:  No  Recommended Plan for Multiple Antipsychotic Therapies: NA     Medication List    Notice    You have not been prescribed any medications.       Follow-up recommendations:  Activity:  as tolerated Diet:  heart healthy well balanced   Comments:  Discharge to home with outpatient follow up for mental health and substance recovery needs.  [facility]  Patient is to call an arrange intake appointment  Take all your medications as prescribed by your mental healthcare provider.  Report any adverse effects and or reactions from your medicines to your outpatient provider  promptly.  Patient is instructed and cautioned to not engage in alcohol and or illegal drug use while on prescription medicines.  In the event of worsening symptoms, patient is instructed to call the crisis hotline, 911 and or go to the nearest ED for appropriate evaluation and treatment of symptoms.  Follow-up with your primary care provider for your other medical issues, concerns and or health care needs.  Total Discharge Time:  Greater than 30 minutes.  Signed: Bonnetta Barryisbach, Shelly 06/26/2014, 1:34 PM  I have been consulted about this patient and agree with the assessment and plan Madie RenoIrving A. ElmerLugo M.D.

## 2014-07-09 ENCOUNTER — Emergency Department (HOSPITAL_COMMUNITY): Payer: Self-pay

## 2014-07-09 ENCOUNTER — Emergency Department (HOSPITAL_COMMUNITY)
Admission: EM | Admit: 2014-07-09 | Discharge: 2014-07-09 | Disposition: A | Discharge status: Home / Self Care | Payer: Self-pay | Attending: Emergency Medicine | Admitting: Emergency Medicine

## 2014-07-09 ENCOUNTER — Encounter (HOSPITAL_COMMUNITY): Payer: Self-pay | Admitting: Emergency Medicine

## 2014-07-09 ENCOUNTER — Emergency Department (HOSPITAL_COMMUNITY)
Admission: EM | Admit: 2014-07-09 | Discharge: 2014-07-09 | Discharge status: Against Medical Advice | Payer: Self-pay | Attending: Emergency Medicine | Admitting: Emergency Medicine

## 2014-07-09 DIAGNOSIS — Z720 Tobacco use: Secondary | ICD-10-CM | POA: Insufficient documentation

## 2014-07-09 DIAGNOSIS — Y92009 Unspecified place in unspecified non-institutional (private) residence as the place of occurrence of the external cause: Secondary | ICD-10-CM | POA: Insufficient documentation

## 2014-07-09 DIAGNOSIS — F419 Anxiety disorder, unspecified: Secondary | ICD-10-CM | POA: Insufficient documentation

## 2014-07-09 DIAGNOSIS — Y9389 Activity, other specified: Secondary | ICD-10-CM | POA: Insufficient documentation

## 2014-07-09 DIAGNOSIS — Z79899 Other long term (current) drug therapy: Secondary | ICD-10-CM | POA: Insufficient documentation

## 2014-07-09 DIAGNOSIS — S24109A Unspecified injury at unspecified level of thoracic spinal cord, initial encounter: Secondary | ICD-10-CM | POA: Insufficient documentation

## 2014-07-09 DIAGNOSIS — W11XXXA Fall on and from ladder, initial encounter: Secondary | ICD-10-CM | POA: Insufficient documentation

## 2014-07-09 DIAGNOSIS — S300XXA Contusion of lower back and pelvis, initial encounter: Secondary | ICD-10-CM | POA: Insufficient documentation

## 2014-07-09 DIAGNOSIS — W1789XA Other fall from one level to another, initial encounter: Secondary | ICD-10-CM | POA: Insufficient documentation

## 2014-07-09 DIAGNOSIS — W19XXXA Unspecified fall, initial encounter: Secondary | ICD-10-CM

## 2014-07-09 DIAGNOSIS — S3992XA Unspecified injury of lower back, initial encounter: Secondary | ICD-10-CM | POA: Insufficient documentation

## 2014-07-09 DIAGNOSIS — Y998 Other external cause status: Secondary | ICD-10-CM | POA: Insufficient documentation

## 2014-07-09 DIAGNOSIS — Y9289 Other specified places as the place of occurrence of the external cause: Secondary | ICD-10-CM | POA: Insufficient documentation

## 2014-07-09 DIAGNOSIS — R52 Pain, unspecified: Secondary | ICD-10-CM

## 2014-07-09 MED ORDER — KETOROLAC TROMETHAMINE 60 MG/2ML IM SOLN
60.0000 mg | Freq: Once | INTRAMUSCULAR | Status: AC
  Administered 2014-07-09: 60 mg via INTRAMUSCULAR
  Filled 2014-07-09: qty 2

## 2014-07-09 MED ORDER — IBUPROFEN 600 MG PO TABS: 600.0000 mg | ORAL_TABLET | Freq: Three times a day (TID) | ORAL | Status: DC | PRN

## 2014-07-09 MED ORDER — CYCLOBENZAPRINE HCL 10 MG PO TABS
5.0000 mg | ORAL_TABLET | Freq: Once | ORAL | Status: AC
  Administered 2014-07-09: 5 mg via ORAL
  Filled 2014-07-09: qty 1

## 2014-07-09 MED ORDER — CYCLOBENZAPRINE HCL 10 MG PO TABS: 10.0000 mg | ORAL_TABLET | Freq: Three times a day (TID) | ORAL | Status: DC | PRN

## 2014-07-09 NOTE — ED Notes (Signed)
[facility] called and said pt was checking in there
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
## 2014-07-09 NOTE — ED Notes (Signed)
Dr. Campos at the bedside.  
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
## 2014-07-09 NOTE — ED Notes (Signed)
Patient reports he was at home and fell off a 12 foot ladder around 0100 this morning. He is unsure if he hit his head, has abrasions on both left and right hips. Complains of back pain. Reports he was at [facility] today after the fall, but left after not being seen.

## 2014-07-15 ENCOUNTER — Encounter (HOSPITAL_COMMUNITY): Payer: Self-pay | Admitting: Emergency Medicine

## 2014-07-15 ENCOUNTER — Emergency Department (HOSPITAL_COMMUNITY)
Admission: EM | Admit: 2014-07-15 | Discharge: 2014-07-15 | Disposition: A | Discharge status: Home / Self Care | Payer: Medicaid - Out of State | Attending: Emergency Medicine | Admitting: Emergency Medicine

## 2014-07-15 DIAGNOSIS — Z720 Tobacco use: Secondary | ICD-10-CM | POA: Insufficient documentation

## 2014-07-15 DIAGNOSIS — F1310 Sedative, hypnotic or anxiolytic abuse, uncomplicated: Secondary | ICD-10-CM | POA: Insufficient documentation

## 2014-07-15 DIAGNOSIS — Z79899 Other long term (current) drug therapy: Secondary | ICD-10-CM | POA: Insufficient documentation

## 2014-07-15 DIAGNOSIS — F10920 Alcohol use, unspecified with intoxication, uncomplicated: Secondary | ICD-10-CM

## 2014-07-15 DIAGNOSIS — F419 Anxiety disorder, unspecified: Secondary | ICD-10-CM | POA: Insufficient documentation

## 2014-07-15 DIAGNOSIS — F10120 Alcohol abuse with intoxication, uncomplicated: Secondary | ICD-10-CM | POA: Insufficient documentation

## 2014-07-15 LAB — RAPID URINE DRUG SCREEN, HOSP PERFORMED
Amphetamines: NOT DETECTED
Barbiturates: NOT DETECTED
Benzodiazepines: POSITIVE — AB
Cocaine: NOT DETECTED
Opiates: NOT DETECTED
Tetrahydrocannabinol: NOT DETECTED

## 2014-07-15 LAB — COMPREHENSIVE METABOLIC PANEL
ALT: 37 U/L (ref 0–53)
AST: 52 U/L — AB (ref 0–37)
Albumin: 4.2 g/dL (ref 3.5–5.2)
Alkaline Phosphatase: 87 U/L (ref 39–117)
Anion gap: 8 (ref 5–15)
BUN: 8 mg/dL (ref 6–23)
CO2: 21 mmol/L (ref 19–32)
CREATININE: 1.01 mg/dL (ref 0.50–1.35)
Calcium: 9.1 mg/dL (ref 8.4–10.5)
Chloride: 109 mEq/L (ref 96–112)
GFR calc Af Amer: >90 mL/min (ref >90)
GFR calc non Af Amer: >90 mL/min (ref >90)
GLUCOSE: 107 mg/dL — AB (ref 70–99)
Potassium: 3.5 mmol/L (ref 3.5–5.1)
Sodium: 138 mmol/L (ref 135–145)
Total Bilirubin: 1.2 mg/dL (ref 0.3–1.2)
Total Protein: 7.3 g/dL (ref 6.0–8.3)

## 2014-07-15 LAB — CBC
HEMATOCRIT: 48.8 % (ref 39.0–52.0)
HEMOGLOBIN: 17.2 g/dL — AB (ref 13.0–17.0)
MCH: 33.3 pg (ref 26.0–34.0)
MCHC: 35.2 g/dL (ref 30.0–36.0)
MCV: 94.4 fL (ref 78.0–100.0)
PLATELETS: 199 10*3/uL (ref 150–400)
RBC: 5.17 MIL/uL (ref 4.22–5.81)
RDW: 13.6 % (ref 11.5–15.5)
WBC: 9.3 10*3/uL (ref 4.0–10.5)

## 2014-07-15 LAB — ETHANOL: Alcohol, Ethyl (B): 271 mg/dL — ABNORMAL HIGH (ref 0–9)

## 2014-07-15 MED ORDER — LORAZEPAM 1 MG PO TABS
0.0000 mg | ORAL_TABLET | Freq: Four times a day (QID) | ORAL | Status: DC
  Administered 2014-07-15 (×2): 1 mg via ORAL
  Filled 2014-07-15 (×2): qty 1

## 2014-07-15 MED ORDER — NICOTINE 21 MG/24HR TD PT24
21.0000 mg | MEDICATED_PATCH | Freq: Every day | TRANSDERMAL | Status: DC
  Administered 2014-07-15: 21 mg via TRANSDERMAL

## 2014-07-15 MED ORDER — LORAZEPAM 1 MG PO TABS: 0.0000 mg | ORAL_TABLET | Freq: Two times a day (BID) | ORAL | Status: DC

## 2014-07-15 MED ORDER — ONDANSETRON HCL 4 MG PO TABS: 4.0000 mg | ORAL_TABLET | Freq: Three times a day (TID) | ORAL | Status: DC | PRN

## 2014-07-15 MED ORDER — IBUPROFEN 200 MG PO TABS
600.0000 mg | ORAL_TABLET | Freq: Four times a day (QID) | ORAL | Status: DC | PRN
  Filled 2014-07-15: qty 3

## 2014-07-15 MED ORDER — ACETAMINOPHEN 325 MG PO TABS: 650.0000 mg | ORAL_TABLET | ORAL | Status: DC | PRN

## 2014-07-15 NOTE — ED Provider Notes (Signed)
Care assumed from Elpidio Anis PA-C at shift change, awaiting for patient to sober up and then likely discharge back to his halfway house. No need for psychiatric eval given no SI/HI.  Physical Exam  BP 121/67 mmHg  Pulse 96  Temp(Src) 97.8 F (36.6 C) (Oral)  Resp 18  SpO2 96%  Physical Exam Gen: afebrile, VSS, NAD, arousable but still sleepy HEENT: EOMI, MMM Resp: no resp distress CV: rate WNL Abd: appearance normal, nondistended MsK: moving all extremities with ease Neuro: A&O x4, speech still slightly slurred, unable to assess gait due to pt sleeping/intoxication Skin: small bruise to L lateral torso, approx 3cm diameter, and another bruise to R buttock. NonTTP. No wounds or abrasions.  ED Course  Procedures Results for orders placed or performed during the hospital encounter of 07/15/14  CBC  Result Value Ref Range   WBC 9.3 4.0 - 10.5 K/uL   RBC 5.17 4.22 - 5.81 MIL/uL   Hemoglobin 17.2 (H) 13.0 - 17.0 g/dL   HCT 35.0 09.3 - 81.8 %   MCV 94.4 78.0 - 100.0 fL   MCH 33.3 26.0 - 34.0 pg   MCHC 35.2 30.0 - 36.0 g/dL   RDW 29.9 37.1 - 69.6 %   Platelets 199 150 - 400 K/uL  Comprehensive metabolic panel  Result Value Ref Range   Sodium 138 135 - 145 mmol/L   Potassium 3.5 3.5 - 5.1 mmol/L   Chloride 109 96 - 112 mEq/L   CO2 21 19 - 32 mmol/L   Glucose, Bld 107 (H) 70 - 99 mg/dL   BUN 8 6 - 23 mg/dL   Creatinine, Ser 7.89 0.50 - 1.35 mg/dL   Calcium 9.1 8.4 - 38.1 mg/dL   Total Protein 7.3 6.0 - 8.3 g/dL   Albumin 4.2 3.5 - 5.2 g/dL   AST 52 (H) 0 - 37 U/L   ALT 37 0 - 53 U/L   Alkaline Phosphatase 87 39 - 117 U/L   Total Bilirubin 1.2 0.3 - 1.2 mg/dL   GFR calc non Af Amer >90 >90 mL/min   GFR calc Af Amer >90 >90 mL/min   Anion gap 8 5 - 15  Ethanol (ETOH)  Result Value Ref Range   Alcohol, Ethyl (B) 271 (H) 0 - 9 mg/dL  Urine Drug Screen  Result Value Ref Range   Opiates NONE DETECTED NONE DETECTED   Cocaine NONE DETECTED NONE DETECTED   Benzodiazepines  POSITIVE (A) NONE DETECTED   Amphetamines NONE DETECTED NONE DETECTED   Tetrahydrocannabinol NONE DETECTED NONE DETECTED   Barbiturates NONE DETECTED NONE DETECTED    Meds ordered this encounter  Medications  . acetaminophen (TYLENOL) tablet 650 mg    Sig:   . nicotine (NICODERM CQ - dosed in mg/24 hours) patch 21 mg    Sig:   . ondansetron (ZOFRAN) tablet 4 mg    Sig:   . LORazepam (ATIVAN) tablet 0-4 mg    Sig:     Order Specific Question:  CIWA-AR < 5 =    Answer:  0    Order Specific Question:  CIWA-AR 5 -10 =    Answer:  1 mg    Order Specific Question:  CIWA-AR 11 -15 =    Answer:  2 mg    Order Specific Question:  CIWA-AR 16 -24 =    Answer:  2 mg    Order Specific Question:  CIWA-AR 16 -24 =    Answer:  Recheck CIWA-AR in 1 hour;  if > 15 notify MD    Order Specific Question:  CIWA-AR > 24 =    Answer:  4 mg    Order Specific Question:  CIWA-AR > 24 =    Answer:  Call Rapid Response   Followed by  . LORazepam (ATIVAN) tablet 0-4 mg    Sig:     Order Specific Question:  CIWA-AR < 5 =    Answer:  0    Order Specific Question:  CIWA-AR 5 -10 =    Answer:  1 mg    Order Specific Question:  CIWA-AR 11 -15 =    Answer:  2 mg    Order Specific Question:  CIWA-AR 16 -24 =    Answer:  2 mg    Order Specific Question:  CIWA-AR 16 -24 =    Answer:  Recheck CIWA-AR in 1 hour; if > 15 notify MD    Order Specific Question:  CIWA-AR > 24 =    Answer:  4 mg    Order Specific Question:  CIWA-AR > 24 =    Answer:  Call Rapid Response     MDM:   ICD-9-CM ICD-10-CM   1. Alcohol intoxication, uncomplicated 305.00 F10.120     9:00 AM- currently still intoxicated, will reassess shortly. Denies SI/HI.  2:25 PM- pt sleeping upon second assessment, but arousable and upon exam pt able to complete tandem walking with steady gait, no slurred speech, appears clinically sober. Will d/c home back to half-way house. Advised pt to continue staying sober.  BP 136/63 mmHg  Pulse 84   Temp(Src) 97.8 F (36.6 C) (Oral)  Resp 20  SpO2 97%   [employer], PA-C 07/15/14 1428  Tomasita Crumble, MD 07/15/14 1517

## 2014-07-15 NOTE — Discharge Instructions (Signed)
Continue to stay sober, do not use drugs or consume any alcohol. Use the list below to find a substance abuse specialist. Return to the ER for changes or worsening symptoms.   Alcohol Intoxication Alcohol intoxication occurs when the amount of alcohol that a person has consumed impairs his or her ability to mentally and physically function. Alcohol directly impairs the normal chemical activity of the brain. Drinking large amounts of alcohol can lead to changes in mental function and behavior, and it can cause many physical effects that can be harmful.  Alcohol intoxication can range in severity from mild to very severe. Various factors can affect the level of intoxication that occurs, such as the person's age, gender, weight, frequency of alcohol consumption, and the presence of other medical conditions (such as diabetes, seizures, or heart conditions). Dangerous levels of alcohol intoxication may occur when people drink large amounts of alcohol in a short period (binge drinking). Alcohol can also be especially dangerous when combined with certain prescription medicines or "recreational" drugs. SIGNS AND SYMPTOMS Some common signs and symptoms of mild alcohol intoxication include:  Loss of coordination.  Changes in mood and behavior.  Impaired judgment.  Slurred speech. As alcohol intoxication progresses to more severe levels, other signs and symptoms will appear. These may include:  Vomiting.  Confusion and impaired memory.  Slowed breathing.  Seizures.  Loss of consciousness. DIAGNOSIS  Your health care provider will take a medical history and perform a physical exam. You will be asked about the amount and type of alcohol you have consumed. Blood tests will be done to measure the concentration of alcohol in your blood. In many places, your blood alcohol level must be lower than 80 mg/dL (1.61%) to legally drive. However, many dangerous effects of alcohol can occur at much lower levels.    TREATMENT  People with alcohol intoxication often do not require treatment. Most of the effects of alcohol intoxication are temporary, and they go away as the alcohol naturally leaves the body. Your health care provider will monitor your condition until you are stable enough to go home. Fluids are sometimes given through an IV access tube to help prevent dehydration.  HOME CARE INSTRUCTIONS  Do not drive after drinking alcohol.  Stay hydrated. Drink enough water and fluids to keep your urine clear or pale yellow. Avoid caffeine.   Only take over-the-counter or prescription medicines as directed by your health care provider.  SEEK MEDICAL CARE IF:   You have persistent vomiting.   You do not feel better after a few days.  You have frequent alcohol intoxication. Your health care provider can help determine if you should see a substance use treatment counselor. SEEK IMMEDIATE MEDICAL CARE IF:   You become shaky or tremble when you try to stop drinking.   You shake uncontrollably (seizure).   You throw up (vomit) blood. This may be bright red or may look like black coffee grounds.   You have blood in your stool. This may be bright red or may appear as a black, tarry, bad smelling stool.   You become lightheaded or faint.  MAKE SURE YOU:   Understand these instructions.  Will watch your condition.  Will get help right away if you are not doing well or get worse. Document Released: 03/25/2005 Document Revised: 02/15/2013 Document Reviewed: 11/18/2012 [facility] Patient Information 2015 [place], [place]. This information is not intended to replace advice given to you by your health care provider. Make sure you discuss any questions  you have with your health care provider.  How Much is Too Much Alcohol? Drinking too much alcohol can cause injury, accidents, and health problems. These types of problems can include:   Car crashes.  Falls.  Family fighting (domestic  violence).  Drowning.  Fights.  Injuries.  Burns.  Damage to certain organs.  Having a baby with birth defects. ONE DRINK CAN BE TOO MUCH WHEN YOU ARE:  Working.  Pregnant or breastfeeding.  Taking medicines. Ask your doctor.  Driving or planning to drive. WHAT IS A STANDARD DRINK?   1 regular beer (12 ounces or 360 milliliters).  1 glass of wine (5 ounces or 150 milliliters).  1 shot of liquor (1.5 ounces or 45 milliliters). BLOOD ALCOHOL LEVELS   .00 A person is sober.  Marland Kitchen03 A person has no trouble keeping balance, talking, or seeing right, but a "buzz" may be felt.  Marland Kitchen05 A person feels "buzzed" and relaxed.  Marland Kitchen08 or .10  A person is drunk. He or she has trouble talking, seeing right, and keeping his or her balance.  .15 A person loses body control and may pass out (blackout).  .20 A person has trouble walking (staggering) and throws up (vomits).  .30 A person will pass out (unconscious).  .40+ A person will be in a coma. Death is possible. If you or someone you know has a drinking problem, get help from a doctor.  Document Released: 04/11/2009 Document Revised: 09/07/2011 Document Reviewed: 04/11/2009 [facility] Patient Information 2015 [place], [place]. This information is not intended to replace advice given to you by your health care provider. Make sure you discuss any questions you have with your health care provider. Emergency Department Resource Guide 1) Find a Doctor and Pay Out of Pocket Although you won't have to find out who is covered by your insurance plan, it is a good idea to ask around and get recommendations. You will then need to call the office and see if the doctor you have chosen will accept you as a new patient and what types of options they offer for patients who are self-pay. Some doctors offer discounts or will set up payment plans for their patients who do not have insurance, but you will need to ask so you aren't surprised when you get to your  appointment.  2) Contact Your Local Health Department Not all health departments have doctors that can see patients for sick visits, but many do, so it is worth a call to see if yours does. If you don't know where your local health department is, you can check in your phone book. The CDC also has a tool to help you locate your state's health department, and many state websites also have listings of all of their local health departments.  3) Find a Walk-in Clinic If your illness is not likely to be very severe or complicated, you may want to try a walk in clinic. These are popping up all over the country in pharmacies, drugstores, and shopping centers. They're usually staffed by nurse practitioners or physician assistants that have been trained to treat common illnesses and complaints. They're usually fairly quick and inexpensive. However, if you have serious medical issues or chronic medical problems, these are probably not your best option.  No Primary Care Doctor: - Call Health Connect at  [phone] - they can help you locate a primary care doctor that  accepts your insurance, provides certain services, etc. - Physician Referral Service- [phone]  Chronic Pain Problems:  Organization         Address  Phone   Notes  [facility]  [phone] Patients need to be referred by their primary care doctor.   Medication Assistance: Organization         Address  Phone   Notes  [facility] Medication [facility] [address] [place], [place] [address] [phone] --Must be a resident of [facility] -- Must have NO insurance coverage whatsoever (no Medicaid/ Medicare, etc.) -- The pt. MUST have a primary care doctor that directs their care regularly and follows them in the community   [facility]ist  [phone]   [employer]  [phone]    Behavioral Health Resources in the Community: Intensive Outpatient Programs Organization          Address  Phone  Notes  [facility] [address] [place], [place] [phone]   [facility] Outpatient [address], [place], [place] [phone]   ADS: [facility] [address] [place], [place]  [phone]   [facility] Mental Health [address]  [place], [place] [phone] or [phone]   Substance Abuse Resources Organization         Address  Phone  Notes  [facility] Drug Services  [phone]   [facility]  [phone]   [facility]  [phone]   Floydene Flock  [phone]   Residential & Outpatient Substance Abuse Program  [phone]   Psychological Services Organization         Address  Phone  Notes  [facility] Behavioral Health  [phone]   [facility] Services  [phone]   [facility] Mental Health [address], [place] [phone] or [phone]    Mobile Crisis Teams Organization         Address  Phone  Notes  [facility], Mobile Crisis Care Unit  [phone]   [facility]  [address] [place], [place] [phone]   Doristine Locks [address] [place] [phone]    Self-Help/Support Groups Organization         Address  Phone             Notes  [facility] [place] - variety of support groups  336- I7437963 Call for more information  Narcotics Anonymous (NA), [facility] [address], [employer] [place]  2 meetings at this location   Statistician         Address  Phone  Notes  [facility] [address],    [place]  [phone]   [facility]  [address] [place] [address], [place], [place] [phone]   [facility] [address]  [place], [place] [phone] Admissions: 8am-3pm M-F  [facility] [address],    [place], [place] [phone]   [facility] [address] Starling Manns [place], [place] [phone]   [facility] [address]  [place], [place] [phone]   [facility] - Intensive Outpatient [address], [place], [place] [phone]   [facility] ([facility]iction [facility] [address] [place]  [place], [place] [phone] or [phone]   [facility] ([facility]) [address], [place], [place] [phone] Accepts Medicaid  [facility] [address],  [place] [phone] Substance Abuse/Addiction Treatment   [facility] Resources Organization  Address  Phone  Notes  [facility]  [phone]   Angie Fava, PhD [address] Ervin Knack [place], [place]   [phone] or [phone]   [facility] Behavioral   [address] [place], [place] [phone]   [facility] Recovery [address] [place], [place] [phone] Insurance/Medicaid/sponsorship through [facility] and Families [address]                                    [place], [place] [phone] Therapy/tele-psych/case  [facility] [address] [place] [address], [place] [phone]    Dr. Lolly Mustache  [phone]   [facility]  [employer] [facility] Dept. 1) [address],  2) [address], [place] 3)  [address], [place] [phone]  [phone]   [facility] Child Abuse Hotline [phone] or 3203892737 (After Hours)      Behavioral Health Resources in the [facility]  Intensive Outpatient Programs: [facility]      [address] [place], [place] [phone] Both a day and evening program       [facility] Outpatient     [address]        [place], [place] [address] [phone]         [facility]: [facility] [address] [place] [phone]  [facility] Mental Health ACCESS  LINE: [phone] or [phone] [address] [place], [place] [address] [email or web address]  Mobile Crisis Teams:                                        [facility]         Mobile Crisis Care Unit [phone]             [facility] [address] Ginette Otto [phone]                                         Interventionist [address] De[address] [place] [phone]  Self-Help/Support Groups: Mental Health Assoc. of [employer] of support groups [phone] (call for more info)  Narcotics Anonymous (NA) [facility] [address] [place] - 2 meetings at this location  Residential Treatment Programs:  [facility]      [address]        [place]       [phone]         [facility] [address], [place] [address] [place], [place]  [address] [phone]  [facility]  [address]enn[address]ouser [place], [place] [address] [phone] Admissions: 8am-3pm M-F  [facility]     [address]        [place], [place] [address]       [phone]         [facility] [address] Starling Manns [place], [place] [phone]  [facility] [address] [place], [place] [phone]  [facility] - Intensive Outpatient      [phone] Alliance  [address]     [place], [place]       [phone]         [facility] (Addiction Recovery [facility].)     [address] [place], [place] [phone] or [phone]  [facility] ([facility])  [address] [place], [place] [phone]  [facility] [address]                                               [address] [place] [phone]  [facility] Resources: [phone]               General Therapy                                                Angie Fava,  PhD        [address] [place], [place] [address]         [phone]   Insurance  [facility] Behavioral   [address] [place], [place] [address] [phone]  [facility] Recovery [address] Angila Wombles [place], [place] [address] [phone] Insurance/Medicaid/sponsorship through [facility] and Families                                              [address]                                        [place], [place] [address]    Therapy/tele-psych/case         [phone]          [facility] [address], [place]  [address]  Adolescent/group home/case management [phone]                                           Creola Corn PhD       General therapy       Insurance   [phone]         Dr. Lolly Mustache Insurance [phone] M-F  [facility] Medicaid, sponsorship [id number]

## 2014-07-15 NOTE — ED Provider Notes (Signed)
CSN: [id number]     Arrival date & time 07/15/14  0218 History   First MD Initiated Contact with Patient 07/15/14 [phone]     Chief Complaint  Patient presents with  . Detox from etoh      (Consider location/radiation/quality/duration/timing/severity/associated sxs/prior Treatment) Patient is a 29 y.o. male presenting with alcohol problem. The history is provided by the patient. No language interpreter was used.  Alcohol Problem This is a chronic problem. Pertinent negatives include no chills or fever. Associated symptoms comments: The patient returns to the ED for assistance with alcohol detox. He reports he has been through inpatient programs and has been living in a halfway house. He continues to drink excessive amounts of alcohol and blames PTSD for his problems. .    Past Medical History  Diagnosis Date  . Back pain   . Anxiety   . Mental disorder    Past Surgical History  Procedure Laterality Date  . Hernia repair     No family history on file. History  Substance Use Topics  . Smoking status: Current Every Day Smoker -- 0.50 packs/day    Types: Cigarettes  . Smokeless tobacco: Former Neurosurgeon  . Alcohol Use: Yes     Comment: pint of liquor a day     Review of Systems  Constitutional: Negative for fever and chills.  HENT: Negative.   Respiratory: Negative.   Cardiovascular: Negative.   Gastrointestinal: Negative.   Musculoskeletal: Negative.   Skin: Negative.   Neurological: Negative.       Allergies  Morphine and related  Home Medications   Prior to Admission medications   Medication Sig Start Date End Date Taking? Authorizing Provider  ALPRAZolam Prudy Feeler) 1 MG tablet Take 1 mg by mouth daily as needed for anxiety.   Yes Historical Provider, MD  clonazePAM (KLONOPIN) 2 MG tablet Take 1-2 mg by mouth 3 (three) times daily as needed for anxiety. Takes 2 tablets to calm down and two more tablets right before going to bed.   Yes Historical Provider, MD  Multiple  Vitamin (MULTIVITAMIN WITH MINERALS) TABS tablet Take 1 tablet by mouth daily. For nutrition supplementation 06/26/14  Yes Bonnetta Barry, NP  cyclobenzaprine (FLEXERIL) 10 MG tablet Take 1 tablet (10 mg total) by mouth 3 (three) times daily as needed for muscle spasms. Patient not taking: Reported on 07/15/2014 07/09/14   Lyanne Co, MD  hydrOXYzine (ATARAX/VISTARIL) 25 MG tablet Take 2 tablets (50 mg total) by mouth every 6 (six) hours as needed for anxiety. Patient not taking: Reported on 07/09/2014 06/26/14   Bonnetta Barry, NP  ibuprofen (ADVIL,MOTRIN) 600 MG tablet Take 1 tablet (600 mg total) by mouth every 8 (eight) hours as needed. Patient not taking: Reported on 07/15/2014 07/09/14   Lyanne Co, MD  mirtazapine (REMERON) 7.5 MG tablet Take 1 tablet (7.5 mg total) by mouth at bedtime. Patient not taking: Reported on 07/09/2014 06/26/14   Bonnetta Barry, NP   BP 127/76 mmHg  Pulse 115  Temp(Src) 98.1 F (36.7 C) (Oral)  Resp 20  SpO2 96% Physical Exam  Constitutional: He is oriented to person, place, and time. He appears well-developed and well-nourished.  Acutely intoxicated.   HENT:  Head: Normocephalic.  Neck: Normal range of motion. Neck supple.  Cardiovascular: Normal rate and regular rhythm.   Pulmonary/Chest: Effort normal and breath sounds normal.  Abdominal: Soft. Bowel sounds are normal. There is no tenderness. There is no rebound and no guarding.  Musculoskeletal: Normal range of  motion.  Neurological: He is alert and oriented to person, place, and time.  Skin: Skin is warm and dry. No rash noted.  Psychiatric: He has a normal mood and affect.    ED Course  Procedures (including critical care time) Labs Review Labs Reviewed  CBC - Abnormal; Notable for the following:    Hemoglobin 17.2 (*)    All other components within normal limits  COMPREHENSIVE METABOLIC PANEL - Abnormal; Notable for the following:    Glucose, Bld 107 (*)    AST 52 (*)    All other  components within normal limits  ETHANOL - Abnormal; Notable for the following:    Alcohol, Ethyl (B) 271 (*)    All other components within normal limits  URINE RAPID DRUG SCREEN (HOSP PERFORMED) - Abnormal; Notable for the following:    Benzodiazepines POSITIVE (*)    All other components within normal limits    Imaging Review No results found.   EKG Interpretation None      MDM   Final diagnoses:  None    1. Alcohol intoxication  Requesting detox. Suspect he will be stable for discharge when sober and can return to his half way house.     Arnoldo Hooker, PA-C 07/15/14 8721  Tomasita Crumble, MD 07/15/14 [phone]

## 2014-07-15 NOTE — ED Notes (Signed)
Pt states "My house man won't let me come back to the house till I blow a 0.0".  When asked how we could help him tonight, pt states "I guess I need that recovery stuff".  Pt admits to drinking (4) 12 oz beers and "6 or 7 shots".  Pt states today is the first day that he has drank in 2 months.  Also admits to smoking marijuana tonight.  Denies SI/HI. Pt slurring words.  Staggers when walking.

## 2014-07-15 NOTE — ED Notes (Signed)
Pt given ativan, pt back resting comfortably, NAD, no aggressive behavior.

## 2014-07-15 NOTE — BH Assessment (Signed)
[facility] Assessment Progress Note   Clinician had a call from Colin Rhein, [place].  She is going to cancel the TTS consult order.  Patient will be allowed to sober up then possibly go back to his halfway house.

## 2014-07-22 ENCOUNTER — Emergency Department (HOSPITAL_COMMUNITY)
Admission: EM | Admit: 2014-07-22 | Discharge: 2014-07-23 | Disposition: A | Discharge status: Home / Self Care | Payer: Medicaid - Out of State | Attending: Emergency Medicine | Admitting: Emergency Medicine

## 2014-07-22 ENCOUNTER — Emergency Department (HOSPITAL_COMMUNITY): Payer: Medicaid - Out of State

## 2014-07-22 ENCOUNTER — Encounter (HOSPITAL_COMMUNITY): Payer: Self-pay | Admitting: Emergency Medicine

## 2014-07-22 DIAGNOSIS — Z79899 Other long term (current) drug therapy: Secondary | ICD-10-CM | POA: Insufficient documentation

## 2014-07-22 DIAGNOSIS — F419 Anxiety disorder, unspecified: Secondary | ICD-10-CM | POA: Insufficient documentation

## 2014-07-22 DIAGNOSIS — M25512 Pain in left shoulder: Secondary | ICD-10-CM

## 2014-07-22 DIAGNOSIS — Z720 Tobacco use: Secondary | ICD-10-CM | POA: Insufficient documentation

## 2014-07-22 NOTE — ED Notes (Signed)
Bed: WLPT3 Expected date: 07/22/14 Expected time: 9:58 PM Means of arrival: Ambulance Comments: Shoulder injury, etoh
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
## 2014-07-22 NOTE — ED Provider Notes (Signed)
TIME SEEN: 10:55 PM  CHIEF COMPLAINT: Left shoulder pain  HPI: Pt is a 29 y.o. right-hand-dominant male who presents to the emergency department with complaints of left shoulder pain.  Patient reports he has had multiple recurrent shoulder dislocations. States that his shoulder dislocated spontaneously yesterday but he was able to reduce it at home. States he had a fall where he slipped on ice today landing on his left shoulder. States it did not dislocate but that he's had increasing pain since then.  Denies hitting his head or losing consciousness. States he has been drinking alcohol tonight to "numb the pain". No drug use. No other injury.  ROS: See HPI Constitutional: no fever  Eyes: no drainage  ENT: no runny nose   Cardiovascular:  no chest pain  Resp: no SOB  GI: no vomiting GU: no dysuria Integumentary: no rash  Allergy: no hives  Musculoskeletal: no leg swelling  Neurological: no slurred speech ROS otherwise negative  PAST MEDICAL HISTORY/PAST SURGICAL HISTORY:  Past Medical History  Diagnosis Date  . Back pain   . Anxiety   . Mental disorder     MEDICATIONS:  Prior to Admission medications   Medication Sig Start Date End Date Taking? Authorizing Provider  clonazePAM (KLONOPIN) 2 MG tablet Take 1-2 mg by mouth 3 (three) times daily as needed for anxiety. Takes 2 tablets to calm down and two more tablets right before going to bed.   Yes Historical Provider, MD  Multiple Vitamin (MULTIVITAMIN WITH MINERALS) TABS tablet Take 1 tablet by mouth daily. For nutrition supplementation 06/26/14  Yes Bonnetta Barry, NP  ALPRAZolam (XANAX) 1 MG tablet Take 1 mg by mouth daily as needed for anxiety.    Historical Provider, MD  cyclobenzaprine (FLEXERIL) 10 MG tablet Take 1 tablet (10 mg total) by mouth 3 (three) times daily as needed for muscle spasms. Patient not taking: Reported on 07/15/2014 07/09/14   Lyanne Co, MD  hydrOXYzine (ATARAX/VISTARIL) 25 MG tablet Take 2 tablets (50  mg total) by mouth every 6 (six) hours as needed for anxiety. Patient not taking: Reported on 07/09/2014 06/26/14   Bonnetta Barry, NP  ibuprofen (ADVIL,MOTRIN) 600 MG tablet Take 1 tablet (600 mg total) by mouth every 8 (eight) hours as needed. Patient not taking: Reported on 07/15/2014 07/09/14   Lyanne Co, MD  mirtazapine (REMERON) 7.5 MG tablet Take 1 tablet (7.5 mg total) by mouth at bedtime. Patient not taking: Reported on 07/09/2014 06/26/14   Bonnetta Barry, NP    ALLERGIES:  Allergies  Allergen Reactions  . Morphine And Related Hives    SOCIAL HISTORY:  History  Substance Use Topics  . Smoking status: Current Every Day Smoker -- 0.50 packs/day    Types: Cigarettes  . Smokeless tobacco: Former Neurosurgeon  . Alcohol Use: Yes     Comment: pint of liquor a day     FAMILY HISTORY: No family history on file.  EXAM: BP 139/91 mmHg  Pulse 122  Temp(Src) 97.8 F (36.6 C) (Oral)  Resp 16  SpO2 97% CONSTITUTIONAL: Alert and oriented and responds appropriately to questions. Well-appearing; well-nourished, smells of alcohol HEAD: Normocephalic, atraumatic EYES: Conjunctivae clear, PERRL, extraocular movements intact ENT: normal nose; no rhinorrhea; moist mucous membranes; pharynx without lesions noted, no dental injury, no septal hematoma NECK: Supple, no meningismus, no LAD, no step-off or deformity, no midline tenderness  CARD: Regular and tachycardic; S1 and S2 appreciated; no murmurs, no clicks, no rubs, no gallops RESP: Normal chest excursion  without splinting or tachypnea; breath sounds clear and equal bilaterally; no wheezes, no rhonchi, no rales,  ABD/GI: Normal bowel sounds; non-distended; soft, non-tender, no rebound, no guarding BACK:  The back appears normal and is non-tender to palpation, there is no CVA tenderness, no step-off or deformity, no midline spinal tenderness EXT: Mildly tender to palpation diffusely over the left shoulder without bony deformity or crepitus  or ecchymosis or swelling, able to reach across his chest and touch the other shoulder with his left hand. 2+ radial pulses bilaterally. No joint effusions. Compartments are soft. Normal ROM in all joints; otherwise extremities are non-tender to palpation; no edema; normal capillary refill; no cyanosis    SKIN: Normal color for age and race; warm NEURO: Moves all extremities equally, sensation to light touch intact diffusely, cranial nerves II through XII intact PSYCH: The patient's mood and manner are appropriate. Grooming and personal hygiene are appropriate.  MEDICAL DECISION MAKING: Patient here with complaints of chronic left shoulder pain. Reports injury earlier today. No obvious injury on exam. Will obtain x-ray. Does endorse alcohol use tonight which may be contributing to his tachycardia. We'll continue to monitor. Given he has been drinking alcohol will hold on any narcotic pain medicine at this time.  ED PROGRESS: Patient sleeping comfortably when I reentered the room. Heart rate in the low 100s. Discussed with patient that his x-ray shows possible degenerative cyst versus a fracture deformity of the greater tuberosity. We'll discharge home in sling. Given his history of alcohol abuse and frequent visits for alcohol intoxication I do not feel it is appropriate to send the patient home with narcotics.  We'll discharge with orthopedic follow-up information.     Layla Maw Alfretta Pinch, DO 07/23/14 [phone]

## 2014-07-22 NOTE — ED Notes (Signed)
Patient sates he has had "three drinks" patient continues "I made the drinks (rum and coke) each drink had four shots in it" (So 12 shots total) Per pt "I was drinking to help try and make the pain ion my shoulder go away, maybe make my muscles relax.Marland KitchenMarland KitchenIm just looking for a referral to help make this shit go away"

## 2014-07-23 ENCOUNTER — Encounter (HOSPITAL_COMMUNITY): Payer: Self-pay

## 2014-07-23 ENCOUNTER — Inpatient Hospital Stay (HOSPITAL_COMMUNITY)
Admission: AD | Admit: 2014-07-23 | Discharge: 2014-07-27 | DRG: 897 | Disposition: A | Discharge status: Home / Self Care | Payer: Federal, State, Local not specified - Other | Source: Intra-hospital | Attending: Psychiatry | Admitting: Psychiatry

## 2014-07-23 ENCOUNTER — Emergency Department (HOSPITAL_COMMUNITY)
Admission: EM | Admit: 2014-07-23 | Discharge: 2014-07-23 | Disposition: A | Discharge status: Other Acute Inpatient Hospital | Payer: Medicaid - Out of State | Attending: Emergency Medicine | Admitting: Emergency Medicine

## 2014-07-23 DIAGNOSIS — Z720 Tobacco use: Secondary | ICD-10-CM | POA: Insufficient documentation

## 2014-07-23 DIAGNOSIS — F19980 Other psychoactive substance use, unspecified with psychoactive substance-induced anxiety disorder: Secondary | ICD-10-CM | POA: Diagnosis present

## 2014-07-23 DIAGNOSIS — R45850 Homicidal ideations: Secondary | ICD-10-CM

## 2014-07-23 DIAGNOSIS — E669 Obesity, unspecified: Secondary | ICD-10-CM | POA: Diagnosis present

## 2014-07-23 DIAGNOSIS — G4700 Insomnia, unspecified: Secondary | ICD-10-CM | POA: Diagnosis present

## 2014-07-23 DIAGNOSIS — F411 Generalized anxiety disorder: Secondary | ICD-10-CM | POA: Diagnosis present

## 2014-07-23 DIAGNOSIS — F1010 Alcohol abuse, uncomplicated: Secondary | ICD-10-CM | POA: Diagnosis present

## 2014-07-23 DIAGNOSIS — F419 Anxiety disorder, unspecified: Secondary | ICD-10-CM | POA: Insufficient documentation

## 2014-07-23 DIAGNOSIS — Y907 Blood alcohol level of 200-239 mg/100 ml: Secondary | ICD-10-CM | POA: Diagnosis present

## 2014-07-23 DIAGNOSIS — F17210 Nicotine dependence, cigarettes, uncomplicated: Secondary | ICD-10-CM | POA: Diagnosis present

## 2014-07-23 DIAGNOSIS — Z6841 Body Mass Index (BMI) 40.0 and over, adult: Secondary | ICD-10-CM | POA: Diagnosis not present

## 2014-07-23 DIAGNOSIS — F1310 Sedative, hypnotic or anxiolytic abuse, uncomplicated: Secondary | ICD-10-CM | POA: Insufficient documentation

## 2014-07-23 DIAGNOSIS — Z79899 Other long term (current) drug therapy: Secondary | ICD-10-CM | POA: Insufficient documentation

## 2014-07-23 DIAGNOSIS — F1020 Alcohol dependence, uncomplicated: Secondary | ICD-10-CM | POA: Diagnosis present

## 2014-07-23 DIAGNOSIS — F1994 Other psychoactive substance use, unspecified with psychoactive substance-induced mood disorder: Secondary | ICD-10-CM | POA: Diagnosis present

## 2014-07-23 DIAGNOSIS — F1320 Sedative, hypnotic or anxiolytic dependence, uncomplicated: Secondary | ICD-10-CM | POA: Diagnosis present

## 2014-07-23 LAB — RAPID URINE DRUG SCREEN, HOSP PERFORMED
AMPHETAMINES: NOT DETECTED
BARBITURATES: NOT DETECTED
Benzodiazepines: POSITIVE — AB
COCAINE: NOT DETECTED
Opiates: NOT DETECTED
TETRAHYDROCANNABINOL: NOT DETECTED

## 2014-07-23 LAB — COMPREHENSIVE METABOLIC PANEL
ALT: 51 U/L (ref 0–53)
ANION GAP: 7 (ref 5–15)
AST: 73 U/L — ABNORMAL HIGH (ref 0–37)
Albumin: 3.7 g/dL (ref 3.5–5.2)
Alkaline Phosphatase: 77 U/L (ref 39–117)
BILIRUBIN TOTAL: 1 mg/dL (ref 0.3–1.2)
CALCIUM: 9.6 mg/dL (ref 8.4–10.5)
CO2: 30 mmol/L (ref 19–32)
Chloride: 110 mmol/L (ref 96–112)
Creatinine, Ser: 1.14 mg/dL (ref 0.50–1.35)
GFR, EST NON AFRICAN AMERICAN: 86 mL/min — AB (ref >90)
GLUCOSE: 109 mg/dL — AB (ref 70–99)
POTASSIUM: 4.1 mmol/L (ref 3.5–5.1)
Sodium: 147 mmol/L — ABNORMAL HIGH (ref 135–145)
Total Protein: 7.1 g/dL (ref 6.0–8.3)

## 2014-07-23 LAB — CBC
HEMATOCRIT: 49.7 % (ref 39.0–52.0)
Hemoglobin: 17.4 g/dL — ABNORMAL HIGH (ref 13.0–17.0)
MCH: 33.1 pg (ref 26.0–34.0)
MCHC: 35 g/dL (ref 30.0–36.0)
MCV: 94.7 fL (ref 78.0–100.0)
Platelets: 193 10*3/uL (ref 150–400)
RBC: 5.25 MIL/uL (ref 4.22–5.81)
RDW: 13.9 % (ref 11.5–15.5)
WBC: 8.3 10*3/uL (ref 4.0–10.5)

## 2014-07-23 LAB — SALICYLATE LEVEL

## 2014-07-23 LAB — ACETAMINOPHEN LEVEL

## 2014-07-23 LAB — ETHANOL: Alcohol, Ethyl (B): 233 mg/dL — ABNORMAL HIGH (ref 0–9)

## 2014-07-23 MED ORDER — LORAZEPAM 1 MG PO TABS
1.0000 mg | ORAL_TABLET | Freq: Three times a day (TID) | ORAL | Status: AC
  Administered 2014-07-24 (×3): 1 mg via ORAL
  Filled 2014-07-23 (×3): qty 1

## 2014-07-23 MED ORDER — ADULT MULTIVITAMIN W/MINERALS CH
1.0000 | ORAL_TABLET | Freq: Every day | ORAL | Status: DC
  Administered 2014-07-23 – 2014-07-27 (×5): 1 via ORAL
  Filled 2014-07-23 (×8): qty 1

## 2014-07-23 MED ORDER — LORAZEPAM 1 MG PO TABS
1.0000 mg | ORAL_TABLET | Freq: Two times a day (BID) | ORAL | Status: AC
  Administered 2014-07-25 (×2): 1 mg via ORAL
  Filled 2014-07-23 (×2): qty 1

## 2014-07-23 MED ORDER — IBUPROFEN 600 MG PO TABS
600.0000 mg | ORAL_TABLET | Freq: Three times a day (TID) | ORAL | Status: DC | PRN
  Administered 2014-07-23 – 2014-07-25 (×5): 600 mg via ORAL
  Filled 2014-07-23 (×5): qty 1

## 2014-07-23 MED ORDER — VITAMIN B-1 100 MG PO TABS
100.0000 mg | ORAL_TABLET | Freq: Every day | ORAL | Status: DC
  Administered 2014-07-23: 100 mg via ORAL
  Filled 2014-07-23: qty 1

## 2014-07-23 MED ORDER — VITAMIN B-1 100 MG PO TABS
100.0000 mg | ORAL_TABLET | Freq: Every day | ORAL | Status: DC
  Administered 2014-07-24 – 2014-07-27 (×4): 100 mg via ORAL
  Filled 2014-07-23 (×7): qty 1

## 2014-07-23 MED ORDER — ACETAMINOPHEN 325 MG PO TABS: 650.0000 mg | ORAL_TABLET | Freq: Four times a day (QID) | ORAL | Status: DC | PRN

## 2014-07-23 MED ORDER — THIAMINE HCL 100 MG/ML IJ SOLN: 100.0000 mg | Freq: Every day | INTRAMUSCULAR | Status: DC

## 2014-07-23 MED ORDER — ALUM & MAG HYDROXIDE-SIMETH 200-200-20 MG/5ML PO SUSP
30.0000 mL | ORAL | Status: DC | PRN
  Administered 2014-07-26: 30 mL via ORAL
  Filled 2014-07-23: qty 30

## 2014-07-23 MED ORDER — ONDANSETRON 4 MG PO TBDP
4.0000 mg | ORAL_TABLET | Freq: Four times a day (QID) | ORAL | Status: AC | PRN
  Administered 2014-07-23: 4 mg via ORAL
  Filled 2014-07-23: qty 1

## 2014-07-23 MED ORDER — MAGNESIUM HYDROXIDE 400 MG/5ML PO SUSP: 30.0000 mL | Freq: Every day | ORAL | Status: DC | PRN

## 2014-07-23 MED ORDER — NICOTINE 21 MG/24HR TD PT24
21.0000 mg | MEDICATED_PATCH | Freq: Every day | TRANSDERMAL | Status: DC
  Administered 2014-07-23: 21 mg via TRANSDERMAL
  Filled 2014-07-23: qty 1

## 2014-07-23 MED ORDER — LOPERAMIDE HCL 2 MG PO CAPS: 2.0000 mg | ORAL_CAPSULE | ORAL | Status: AC | PRN

## 2014-07-23 MED ORDER — LORAZEPAM 1 MG PO TABS: 0.0000 mg | ORAL_TABLET | Freq: Two times a day (BID) | ORAL | Status: DC

## 2014-07-23 MED ORDER — LORAZEPAM 1 MG PO TABS
0.0000 mg | ORAL_TABLET | Freq: Four times a day (QID) | ORAL | Status: DC
  Administered 2014-07-23: 1 mg via ORAL
  Filled 2014-07-23: qty 1

## 2014-07-23 MED ORDER — LORAZEPAM 1 MG PO TABS
1.0000 mg | ORAL_TABLET | Freq: Every day | ORAL | Status: AC
  Administered 2014-07-26: 1 mg via ORAL
  Filled 2014-07-23: qty 1

## 2014-07-23 MED ORDER — IBUPROFEN 800 MG PO TABS
800.0000 mg | ORAL_TABLET | Freq: Three times a day (TID) | ORAL | Status: DC | PRN
Start: 2014-07-23 — End: 2014-07-27

## 2014-07-23 MED ORDER — LORAZEPAM 1 MG PO TABS
1.0000 mg | ORAL_TABLET | Freq: Four times a day (QID) | ORAL | Status: AC
  Administered 2014-07-23 (×3): 1 mg via ORAL
  Filled 2014-07-23 (×3): qty 1

## 2014-07-23 MED ORDER — IBUPROFEN 800 MG PO TABS
800.0000 mg | ORAL_TABLET | Freq: Once | ORAL | Status: AC
  Administered 2014-07-23: 800 mg via ORAL
  Filled 2014-07-23: qty 1

## 2014-07-23 MED ORDER — NICOTINE 21 MG/24HR TD PT24
21.0000 mg | MEDICATED_PATCH | Freq: Every day | TRANSDERMAL | Status: DC
  Administered 2014-07-24 – 2014-07-27 (×4): 21 mg via TRANSDERMAL
  Filled 2014-07-23 (×7): qty 1

## 2014-07-23 MED ORDER — HYDROXYZINE HCL 25 MG PO TABS
25.0000 mg | ORAL_TABLET | Freq: Four times a day (QID) | ORAL | Status: AC | PRN
  Administered 2014-07-24 – 2014-07-25 (×3): 25 mg via ORAL
  Filled 2014-07-23 (×3): qty 1

## 2014-07-23 MED ORDER — ZOLPIDEM TARTRATE 5 MG PO TABS
5.0000 mg | ORAL_TABLET | Freq: Every evening | ORAL | Status: DC | PRN
  Administered 2014-07-23 – 2014-07-26 (×4): 5 mg via ORAL
  Filled 2014-07-23 (×4): qty 1

## 2014-07-23 NOTE — ED Notes (Signed)
Staffing office notified for pt.'s sitter . 

## 2014-07-23 NOTE — ED Notes (Signed)
Pt.is getting undress into scrubs AND SOCKS

## 2014-07-23 NOTE — ED Notes (Signed)
All belongings and valuables sent to [facility] with pt

## 2014-07-23 NOTE — ED Notes (Signed)
Unable to get TTS machine to operate.

## 2014-07-23 NOTE — BH Assessment (Addendum)
Tele Assessment Note   Larry Adams is an 29 y.o. male, single, Caucasian who presents unaccompanied to [facility] ED reporting dependence on alcohol and benzodiazepines and also homicidal ideation towards his aunt. Pt reports he wants to "lure her to [place] and eradicate her." Pt states he would kill her and has access to multiple firearms. He states he became very upset with her tonight because she would not let him speak to his father. He also feels his aunt is taking advantage of his father's disability money. Pt reports he has run out of Klonopin, is experiencing withdrawal, and wanted to obtain some of his father's Klonopin. Pt states he typically will take 3 mg of Xanax plus 2-6 mg of Klonopin daily. Pt says he found a Klonopin today but he has been running short for two weeks. When he does not have benzodiazepines he abuses alcohol and can drinking 6-10 beers or a liter of liquor daily. He reports he has been abusing substances his entire adult life and has very little sobriety when not in a treatment setting. He reports occasional marijuana use but denies other substance abuse. Pt reports a history of withdrawal symptoms including tremors, anxiety and general malaise.  Pt reports he has felt increasingly depressed and anxious over the past month. He reports symptoms including crying spells, insomnia, poor appetite, social withdrawal, loss of interest in usual pleasures and feelings of guilt and hopelessness. Pt reports he has severe anxiety and panic attacks several times per week. He states he normally does not have homicidal thoughts and he denies any history of assault or violence. He denies current suicidal ideation or history of suicide attempts. He reports seeing "shadows" out of his peripheral vision and denies any other psychotic symptoms.  Pt is currently living in a halfway house. He reports he is employed with the [employer]. He identifies conflicts with his family as his primary stressor.  He says he has a new girlfriend but "wonders why she wants to be with someone who obviously has so many problems." Pt reports a history of substance abuse related legal problems but denies any current charges or court dates. Pt reports he has been in substance abuse treatment several times, the most recent being a 28-day treatment center in [place], after which Pt immediately relapsed. Pt says he has no current outpatient provider.  Pt is dressed in hospital scrubs, alert, oriented x4 with normal speech and normal motor behavior. Pt is currently intoxicated with blood alcohol of 233. Eye contact is good. Pt's mood is depressed and anxious; affect is congruent with mood. Thought process is coherent and relevant. There is no indication Pt is currently responding to internal stimuli or experiencing delusional thought content. Pt is requesting inpatient treatment for symptoms of depression and anxiety in addition to substance abuse.   Axis I: Substance induced depressive disorder; Anxiolytic Use Disorder, Severe; Alcohol Use Disorder, Severe Axis II: Deferred Axis III:  Past Medical History  Diagnosis Date  . Back pain   . Anxiety   . Mental disorder    Axis IV: other psychosocial or environmental problems, problems related to social environment, problems with access to health care services and problems with primary support group Axis V: GAF=35  Past Medical History:  Past Medical History  Diagnosis Date  . Back pain   . Anxiety   . Mental disorder     Past Surgical History  Procedure Laterality Date  . Hernia repair      Family History: No  family history on file.  Social History:  reports that he has been smoking Cigarettes.  He has been smoking about 0.50 packs per day. He has quit using smokeless tobacco. He reports that he drinks alcohol. He reports that he uses illicit drugs (Marijuana and Benzodiazepines) about 5 times per week.  Additional Social History:  Alcohol / Drug  Use Pain Medications: See MAR  Prescriptions: See MAR  Over the Counter: See MAR  History of alcohol / drug use?: Yes Longest period of sobriety (when/how long): Only when in detox  Negative Consequences of Use: Work / Programmer, multimedia, Copywriter, advertising relationships, Surveyor, quantity Withdrawal Symptoms: Tremors, Irritability Substance #1 Name of Substance 1: Alcohol  1 - Age of First Use: 16 YOM  1 - Amount (size/oz): 1/2 Gal or 1 pint of liquor  1 - Frequency: Daily  1 - Duration: On-going  1 - Last Use / Amount: 07/22/14 Substance #2 Name of Substance 2: THC  2 - Age of First Use: 17 YOM  2 - Amount (size/oz): 1 Joint  2 - Frequency: Sporadic  2 - Duration: On-going  2 - Last Use / Amount: Pt cannot remember Substance #3 Name of Substance 3: Benzos(Xanax & Klonopin)  3 - Age of First Use: 20 YOM  3 - Amount (size/oz): 7 Pills  3 - Frequency: Daily  3 - Duration: On-going  3 - Last Use / Amount: Use one Klonopin today  CIWA: CIWA-Ar BP: 128/80 mmHg Pulse Rate: 82 Nausea and Vomiting: 2 Tactile Disturbances: none Tremor: three Auditory Disturbances: not present Paroxysmal Sweats: no sweat visible Visual Disturbances: not present Anxiety: three Headache, Fullness in Head: mild Agitation: normal activity Orientation and Clouding of Sensorium: oriented and can do serial additions CIWA-Ar Total: 10 COWS:    PATIENT STRENGTHS: (choose at least two) Ability for insight Average or above average intelligence Capable of independent living Communication skills Financial means General fund of knowledge Motivation for treatment/growth Physical Health Work skills  Allergies:  Allergies  Allergen Reactions  . Morphine And Related Hives    Home Medications:  (Not in a hospital admission)  OB/GYN Status:  No LMP for male patient.  General Assessment Data Location of Assessment: [facility] ED Is this a Tele or Face-to-Face Assessment?: Tele Assessment Is this an Initial Assessment or a  Re-assessment for this encounter?: Initial Assessment Living Arrangements: Other (Comment) (Halfway house) Can pt return to current living arrangement?: Yes Admission Status: Voluntary Is patient capable of signing voluntary admission?: Yes Transfer from: Home Referral Source: Self/Family/Friend     [facility] Crisis Care Plan Living Arrangements: Other (Comment) (Halfway house) Name of Psychiatrist: None  Name of Therapist: None   Education Status Is patient currently in school?: No Current Grade: NA Highest grade of school patient has completed: High school Name of school: NA Contact person: NA  Risk to self with the past 6 months Suicidal Ideation: No Suicidal Intent: No Is patient at risk for suicide?: No Suicidal Plan?: No Access to Means: No What has been your use of drugs/alcohol within the last 12 months?: Pt abusing alcohol and benzodiazepines Previous Attempts/Gestures: No How many times?: 0 Other Self Harm Risks: None Triggers for Past Attempts: None known Intentional Self Injurious Behavior: None Family Suicide History: No Recent stressful life event(s): Conflict (Comment) (Conflict with family and girlfriend) Persecutory voices/beliefs?: No Depression: Yes Depression Symptoms: Despondent, Insomnia, Tearfulness, Isolating, Fatigue, Guilt, Loss of interest in usual pleasures, Feeling worthless/self pity, Feeling angry/irritable Substance abuse history and/or treatment for substance abuse?:  Yes Suicide prevention information given to non-admitted patients: Not applicable  Risk to Others within the past 6 months Homicidal Ideation: Yes-Currently Present Thoughts of Harm to Others: Yes-Currently Present Comment - Thoughts of Harm to Others: Thought of luring aunt to [employer] and killing her Current Homicidal Intent: No Current Homicidal Plan: Yes-Currently Present Describe Current Homicidal Plan: Pt has access to firearms Access to Homicidal Means: Yes Describe  Access to Homicidal Means: Access to firearms Identified Victim: Aunt History of harm to others?: No Assessment of Violence: None Noted Violent Behavior Description: Pt denies history of violence Does patient have access to weapons?: Yes (Comment) Criminal Charges Pending?: No Does patient have a court date: No  Psychosis Hallucinations: Visual (Pt reports seeing shadows in peripheral vision) Delusions: None noted  Mental Status Report Appear/Hygiene: In scrubs Eye Contact: Good Motor Activity: Unremarkable Speech: Logical/coherent Level of Consciousness: Alert Mood: Depressed, Anxious Affect: Anxious, Depressed Anxiety Level: Panic Attacks Panic attack frequency: 2-3 times per week Most recent panic attack: Today Thought Processes: Coherent, Relevant Judgement: Partial Orientation: Person, Place, Time, Situation Obsessive Compulsive Thoughts/Behaviors: None  Cognitive Functioning Concentration: Normal Memory: Recent Intact, Remote Intact IQ: Average Insight: Fair Impulse Control: Fair Appetite: Poor Weight Loss: 0 Weight Gain: 0 Sleep: Decreased Total Hours of Sleep: 3 Vegetative Symptoms: None  ADLScreening [facility] Assessment Services) Patient's cognitive ability adequate to safely complete daily activities?: Yes Patient able to express need for assistance with ADLs?: Yes Independently performs ADLs?: Yes (appropriate for developmental age)  Prior Inpatient Therapy Prior Inpatient Therapy: Yes Prior Therapy Dates: 2005,2013,2015 Prior Therapy Facilty/Provider(s): Rehab in [place], [facility], [facility], [facility]  Reason for Treatment: Detox/Rehab   Prior Outpatient Therapy Prior Outpatient Therapy: No Prior Therapy Dates: None  Prior Therapy Facilty/Provider(s): None  Reason for Treatment: None   ADL Screening (condition at time of admission) Patient's cognitive ability adequate to safely complete daily activities?: Yes Is the patient deaf or have difficulty  hearing?: No Does the patient have difficulty seeing, even when wearing glasses/contacts?: No Does the patient have difficulty concentrating, remembering, or making decisions?: No Patient able to express need for assistance with ADLs?: Yes Does the patient have difficulty dressing or bathing?: No Independently performs ADLs?: Yes (appropriate for developmental age) Does the patient have difficulty walking or climbing stairs?: No Weakness of Legs: None Weakness of Arms/Hands: None  Home Assistive Devices/Equipment Home Assistive Devices/Equipment: None    Abuse/Neglect Assessment (Assessment to be complete while patient is alone) Physical Abuse: Denies Verbal Abuse: Yes, past (Comment) (Reports history of verbal abuse) Sexual Abuse: Denies Exploitation of patient/patient's resources: Denies Self-Neglect: Denies     Merchant navy officer (For Healthcare) Does patient have an advance directive?: No Would patient like information on creating an advanced directive?: No - patient declined information    Additional Information 1:1 In Past 12 Months?: No CIRT Risk: No Elopement Risk: No Does patient have medical clearance?: Yes     Disposition:  Binnie Rail, AC at [facility], confirmed bed availability. Gave clinical report to Nanine Means, NP who accepted Pt to the service of Dr. Geoffery Lyons, room 405-1. Notified Dr. Rubin Payor and Justice Deeds, RN of acceptance.  Disposition Initial Assessment Completed for this Encounter: Yes Disposition of Patient: Inpatient treatment program Type of inpatient treatment program: Adult   Pamalee Leyden, [facility], [facility] Triage Specialist [phone]   Pamalee Leyden 07/23/2014 6:33 AM

## 2014-07-23 NOTE — Progress Notes (Signed)
Patient is a 29 yr old that came into the [facility] initially c/o shoulder pain. That was x-rayed and was normal. He told them that he was having thoughts to kill his aunt because when he called his dad she would not let him talk to him per patient. Patient denies depression or SI but reports increased anxiety and states that he takes xanax and klonopin but ran out of the medication. He said that when he ran out of the medication he began drinking heavily. Reports that the amount and choice of alcohol varies but level 233 at ED. He was also positive benzos but nothing else. Hx of marijuana use but not recently. Denies any medical issues at present. Cooperative with admission process.

## 2014-07-23 NOTE — BH Assessment (Signed)
Assessment complete. Binnie Rail, [facility], confirmed bed availability. Gave clinical report to Nanine Means, NP who accepted Pt to the service of Dr. Geoffery Lyons, room 405-1. Notified Dr. Rubin Payor and Justice Deeds, RN of acceptance.  Harlin Rain Ria Comment, [facility] Triage Specialist [phone]

## 2014-07-23 NOTE — Discharge Instructions (Signed)
Shoulder Pain The shoulder is the joint that connects your arms to your body. The bones that form the shoulder joint include the upper arm bone (humerus), the shoulder blade (scapula), and the collarbone (clavicle). The top of the humerus is shaped like a ball and fits into a rather flat socket on the scapula (glenoid cavity). A combination of muscles and strong, fibrous tissues that connect muscles to bones (tendons) support your shoulder joint and hold the ball in the socket. Small, fluid-filled sacs (bursae) are located in different areas of the joint. They act as cushions between the bones and the overlying soft tissues and help reduce friction between the gliding tendons and the bone as you move your arm. Your shoulder joint allows a wide range of motion in your arm. This range of motion allows you to do things like scratch your back or throw a ball. However, this range of motion also makes your shoulder more prone to pain from overuse and injury. Causes of shoulder pain can originate from both injury and overuse and usually can be grouped in the following four categories:  Redness, swelling, and pain (inflammation) of the tendon (tendinitis) or the bursae (bursitis).  Instability, such as a dislocation of the joint.  Inflammation of the joint (arthritis).  Broken bone (fracture). HOME CARE INSTRUCTIONS   Apply ice to the sore area.  Put ice in a plastic bag.  Place a towel between your skin and the bag.  Leave the ice on for 15-20 minutes, 3-4 times per day for the first 2 days, or as directed by your health care provider.  Stop using cold packs if they do not help with the pain.  If you have a shoulder sling or immobilizer, wear it as long as your caregiver instructs. Only remove it to shower or bathe. Move your arm as little as possible, but keep your hand moving to prevent swelling.  Squeeze a soft ball or foam pad as much as possible to help prevent swelling.  Only take  over-the-counter or prescription medicines for pain, discomfort, or fever as directed by your caregiver. SEEK MEDICAL CARE IF:   Your shoulder pain increases, or new pain develops in your arm, hand, or fingers.  Your hand or fingers become cold and numb.  Your pain is not relieved with medicines. SEEK IMMEDIATE MEDICAL CARE IF:   Your arm, hand, or fingers are numb or tingling.  Your arm, hand, or fingers are significantly swollen or turn white or blue. MAKE SURE YOU:   Understand these instructions.  Will watch your condition.  Will get help right away if you are not doing well or get worse. Document Released: 03/25/2005 Document Revised: 10/30/2013 Document Reviewed: 05/30/2011 [facility] Patient Information 2015 [place], [place]. This information is not intended to replace advice given to you by your health care provider. Make sure you discuss any questions you have with your health care provider.   Shoulder Dislocation Your shoulder is made up of three bones: the collar bone (clavicle); the shoulder blade (scapula), which includes the socket (glenoid cavity); and the upper arm bone (humerus). Your shoulder joint is the place where these bones meet. Strong, fibrous tissues hold these bones together (ligaments). Muscles and strong, fibrous tissues that connect the muscles to these bones (tendons) allow your arm to move through this joint. The range of motion of your shoulder joint is more extensive than most of your other joints, and the glenoid cavity is very shallow. That is the reason that  your shoulder joint is one of the most unstable joints in your body. It is far more prone to dislocation than your other joints. Shoulder dislocation is when your humerus is forced out of your shoulder joint. CAUSES Shoulder dislocation is caused by a forceful impact on your shoulder. This impact usually is from an injury, such as a sports injury or a fall. SYMPTOMS Symptoms of shoulder dislocation  include:  Deformity of your shoulder.  Intense pain.  Inability to move your shoulder joint.  Numbness, weakness, or tingling around your shoulder joint (your neck or down your arm).  Bruising or swelling around your shoulder. DIAGNOSIS In order to diagnose a dislocated shoulder, your caregiver will perform a physical exam. Your caregiver also may have an X-ray exam done to see if you have any broken bones. Magnetic resonance imaging (MRI) is a procedure that sometimes is done to help your caregiver see any damage to the soft tissues around your shoulder, particularly your rotator cuff tendons. Additionally, your caregiver also may have electromyography done to measure the electrical discharges produced in your muscles if you have signs or symptoms of nerve damage. TREATMENT A shoulder dislocation is treated by placing the humerus back in the joint (reduction). Your caregiver does this either manually (closed reduction), by moving your humerus back into the joint through manipulation, or through surgery (open reduction). When your humerus is back in place, severe pain should improve almost immediately. You also may need to have surgery if you have a weak shoulder joint or ligaments, and you have recurring shoulder dislocations, despite rehabilitation. In rare cases, surgery is necessary if your nerves or blood vessels are damaged during the dislocation. After your reduction, your arm will be placed in a shoulder immobilizer or sling to keep it from moving. Your caregiver will have you wear your shoulder immobilizer or sling for 3 days to 3 weeks, depending on how serious your dislocation is. When your shoulder immobilizer or sling is removed, your caregiver may prescribe physical therapy to help improve the range of motion in your shoulder joint. HOME CARE INSTRUCTIONS  The following measures can help to reduce pain and speed up the healing process:  Rest your injured joint. Do not move it. Avoid  activities similar to the one that caused your injury.  Apply ice to your injured joint for the first day or two after your reduction or as directed by your caregiver. Applying ice helps to reduce inflammation and pain.  Put ice in a plastic bag.  Place a towel between your skin and the bag.  Leave the ice on for 15-20 minutes at a time, every 2 hours while you are awake.  Exercise your hand by squeezing a soft ball. This helps to eliminate stiffness and swelling in your hand and wrist.  Take over-the-counter or prescription medicine for pain or discomfort as told by your caregiver. SEEK IMMEDIATE MEDICAL CARE IF:   Your shoulder immobilizer or sling becomes damaged.  Your pain becomes worse rather than better.  You lose feeling in your arm or hand, or they become white and cold. MAKE SURE YOU:   Understand these instructions.  Will watch your condition.  Will get help right away if you are not doing well or get worse. Document Released: 03/10/2001 Document Revised: 10/30/2013 Document Reviewed: 04/05/2011 [facility] Patient Information 2015 [place], [place]. This information is not intended to replace advice given to you by your health care provider. Make sure you discuss any questions you have with  your health care provider.   RICE: Routine Care for Injuries The routine care of many injuries includes Rest, Ice, Compression, and Elevation (RICE). HOME CARE INSTRUCTIONS  Rest is needed to allow your body to heal. Routine activities can usually be resumed when comfortable. Injured tendons and bones can take up to 6 weeks to heal. Tendons are the cord-like structures that attach muscle to bone.  Ice following an injury helps keep the swelling down and reduces pain.  Put ice in a plastic bag.  Place a towel between your skin and the bag.  Leave the ice on for 15-20 minutes, 3-4 times a day, or as directed by your health care provider. Do this while awake, for the first 24 to 48  hours. After that, continue as directed by your caregiver.  Compression helps keep swelling down. It also gives support and helps with discomfort. If an elastic bandage has been applied, it should be removed and reapplied every 3 to 4 hours. It should not be applied tightly, but firmly enough to keep swelling down. Watch fingers or toes for swelling, bluish discoloration, coldness, numbness, or excessive pain. If any of these problems occur, remove the bandage and reapply loosely. Contact your caregiver if these problems continue.  Elevation helps reduce swelling and decreases pain. With extremities, such as the arms, hands, legs, and feet, the injured area should be placed near or above the level of the heart, if possible. SEEK IMMEDIATE MEDICAL CARE IF:  You have persistent pain and swelling.  You develop redness, numbness, or unexpected weakness.  Your symptoms are getting worse rather than improving after several days. These symptoms may indicate that further evaluation or further X-rays are needed. Sometimes, X-rays may not show a small broken bone (fracture) until 1 week or 10 days later. Make a follow-up appointment with your caregiver. Ask when your X-ray results will be ready. Make sure you get your X-ray results. Document Released: 09/27/2000 Document Revised: 06/20/2013 Document Reviewed: 11/14/2010 [facility] Patient Information 2015 [place], [place]. This information is not intended to replace advice given to you by your health care provider. Make sure you discuss any questions you have with your health care provider.     Alcohol Intoxication Alcohol intoxication occurs when the amount of alcohol that a person has consumed impairs his or her ability to mentally and physically function. Alcohol directly impairs the normal chemical activity of the brain. Drinking large amounts of alcohol can lead to changes in mental function and behavior, and it can cause many physical effects that can  be harmful.  Alcohol intoxication can range in severity from mild to very severe. Various factors can affect the level of intoxication that occurs, such as the person's age, gender, weight, frequency of alcohol consumption, and the presence of other medical conditions (such as diabetes, seizures, or heart conditions). Dangerous levels of alcohol intoxication may occur when people drink large amounts of alcohol in a short period (binge drinking). Alcohol can also be especially dangerous when combined with certain prescription medicines or "recreational" drugs. SIGNS AND SYMPTOMS Some common signs and symptoms of mild alcohol intoxication include:  Loss of coordination.  Changes in mood and behavior.  Impaired judgment.  Slurred speech. As alcohol intoxication progresses to more severe levels, other signs and symptoms will appear. These may include:  Vomiting.  Confusion and impaired memory.  Slowed breathing.  Seizures.  Loss of consciousness. DIAGNOSIS  Your health care provider will take a medical history and perform a physical exam.  You will be asked about the amount and type of alcohol you have consumed. Blood tests will be done to measure the concentration of alcohol in your blood. In many places, your blood alcohol level must be lower than 80 mg/dL (4.50%) to legally drive. However, many dangerous effects of alcohol can occur at much lower levels.  TREATMENT  People with alcohol intoxication often do not require treatment. Most of the effects of alcohol intoxication are temporary, and they go away as the alcohol naturally leaves the body. Your health care provider will monitor your condition until you are stable enough to go home. Fluids are sometimes given through an IV access tube to help prevent dehydration.  HOME CARE INSTRUCTIONS  Do not drive after drinking alcohol.  Stay hydrated. Drink enough water and fluids to keep your urine clear or pale yellow. Avoid caffeine.    Only take over-the-counter or prescription medicines as directed by your health care provider.  SEEK MEDICAL CARE IF:   You have persistent vomiting.   You do not feel better after a few days.  You have frequent alcohol intoxication. Your health care provider can help determine if you should see a substance use treatment counselor. SEEK IMMEDIATE MEDICAL CARE IF:   You become shaky or tremble when you try to stop drinking.   You shake uncontrollably (seizure).   You throw up (vomit) blood. This may be bright red or may look like black coffee grounds.   You have blood in your stool. This may be bright red or may appear as a black, tarry, bad smelling stool.   You become lightheaded or faint.  MAKE SURE YOU:   Understand these instructions.  Will watch your condition.  Will get help right away if you are not doing well or get worse. Document Released: 03/25/2005 Document Revised: 02/15/2013 Document Reviewed: 11/18/2012 [facility] Patient Information 2015 [place], [place]. This information is not intended to replace advice given to you by your health care provider. Make sure you discuss any questions you have with your health care provider.     Emergency Department Resource Guide 1) Find a Doctor and Pay Out of Pocket Although you won't have to find out who is covered by your insurance plan, it is a good idea to ask around and get recommendations. You will then need to call the office and see if the doctor you have chosen will accept you as a new patient and what types of options they offer for patients who are self-pay. Some doctors offer discounts or will set up payment plans for their patients who do not have insurance, but you will need to ask so you aren't surprised when you get to your appointment.  2) Contact Your Local Health Department Not all health departments have doctors that can see patients for sick visits, but many do, so it is worth a call to see if yours  does. If you don't know where your local health department is, you can check in your phone book. The CDC also has a tool to help you locate your state's health department, and many state websites also have listings of all of their local health departments.  3) Find a Walk-in Clinic If your illness is not likely to be very severe or complicated, you may want to try a walk in clinic. These are popping up all over the country in pharmacies, drugstores, and shopping centers. They're usually staffed by nurse practitioners or physician assistants that have been trained to treat  common illnesses and complaints. They're usually fairly quick and inexpensive. However, if you have serious medical issues or chronic medical problems, these are probably not your best option.  No Primary Care Doctor: - Call Health Connect at  [phone] - they can help you locate a primary care doctor that  accepts your insurance, provides certain services, etc. - Physician Referral Service- [phone]  Chronic Pain Problems: Organization         Address  Phone   Notes  [facility]  [phone] Patients need to be referred by their primary care doctor.   Medication Assistance: Organization         Address  Phone   Notes  [facility] Medication [facility] [address] [place] [place], [place] [address] [phone] --Must be a resident of [facility] -- Must have NO insurance coverage whatsoever (no Medicaid/ Medicare, etc.) -- The pt. MUST have a primary care doctor that directs their care regularly and follows them in the community   [facility]  [phone]   [employer]  [phone]    Agencies that provide inexpensive medical care: Organization         Address  Phone   Notes  [facility]  [phone]   [facility]    [phone]   [facility] [address] [place], [place] [address] [phone]    [facility] [address] [place] [phone]   [facility]anned Parenthood    [phone]   [facility]    [phone]   Community Health and [facility]  [address], [place] Phone:  [phone], Fax:  [phone] Hours of Operation:  9 am - 6 pm, M-F.  Also accepts Medicaid/Medicare and self-pay.  [facility]  [address], [place] Phone: [phone], Fax: [phone]. Hours of Operation:  8:30 am - 5:30 pm, M-F.  Also accepts Medicaid and self-pay.  [facility] [place] [address], [place] Phone: [phone]   [facility] [address] Natasha Bence [place], [place] [phone], Ext. [phone] Mondays & Thursdays: 7-9 AM.  First 15 patients are seen on a first come, first serve basis.    Medicaid-accepting [facility] Providers:  Organization         Address  Phone   Notes  [facility] [address], [place] [phone] Also accepts self-pay patients.  [facility] [address] Laurell Josephs [place], [place]  [phone]   [facility] [address], [place] [phone]   [facility] [address] [place] [phone]   Renaye Rakers [address], [place]   [phone] Only accepts [place] patients after they have their name applied to their card.   Self-Pay (no insurance) in [facility]:  Organization         Address  Phone   Notes  Sickle Cell Patients, [facility] [address] [place], [place] [phone]   [facility] [address] [place], [place] [phone]   [facility] [place]  [address], [place] [phone]   [facility]/Dr. Osei-Bonsu  [address] [place] [address] [place] or [address],  [place] [phone] Phone number for both [facility] and [place] locations is the same.  Urgent Medical and [facility] [address] [place] [phone]   [facility] [address] [place] or [address] [phone]   [facility] [address] [place] [phone], phone; [phone], fax Sees patients 1st and 3rd Saturday of every month.  Must not qualify for public or private insurance (i.e. Medicaid, Medicare, [place] Health Choice, Veterans' Benefits)  Household income should be no more than 200% of the poverty level The clinic cannot treat you if you are pregnant or think you are pregnant  Sexually transmitted diseases are not treated at the clinic.    Dental Care: Organization         Address  Phone  Notes  [facility] [address] [place], [place] [phone] Accepts children up to age 36 who are enrolled in [place] or [place] Health Choice; pregnant women with a Medicaid card; and children who have applied for Medicaid or [place] Health Choice, but were declined, whose parents can pay a reduced fee at time of service.  [facility] Department of [facility]  [address], [place] [phone] Accepts children up to age 93 who are enrolled in [place] or [place] Health Choice; pregnant women with a Medicaid card; and children who have applied for Medicaid or [place] Health Choice, but were declined, whose parents can pay a reduced fee at time of service.  [facility]  [address] [place] [address] [place], [place] [phone] Patients are seen by appointment only. Walk-ins are not accepted. [facility] will see patients 79 years of age and older. Monday - Tuesday (8am-5pm) Most Wednesdays (8:30-5pm) $30 per visit, cash only  [facility] Adult Dental Access PROGRAM  [address], [facility] [phone] Patients are seen by  appointment only. Walk-ins are not accepted. [facility] will see patients 11 years of age and older. One Wednesday Evening (Monthly: Volunteer Based).  $30 per visit, cash only  [employer]  [phone] for adults; Children under age 1, call Graduate Pediatric Dentistry at [phone]. Children aged 28-14, please call [phone] to request a pediatric application.  Dental services are provided in all areas of dental care including fillings, crowns and bridges, complete and partial dentures, implants, gum treatment, root canals, and extractions. Preventive care is also provided. Treatment is provided to both adults and children. Patients are selected via a lottery and there is often a waiting list.   [facility] [address] [place]  [phone] [email or web address]   [facility] [address] [place], [place] [phone], Ext. [phone] Second and Fourth Thursday of each month, opens at 6:30 AM; Clinic ends at 9 AM.  Patients are seen on a first-come first-served basis, and a limited number are seen during each clinic.   [facility]  [address] Ether Griffins [place], [place] [phone]   Eligibility Requirements You must have lived in [place], [place], or [place] counties for at least the last three months.   You cannot be eligible for state or federal sponsored [place], including [employer], [place], or [employer].   You generally cannot be eligible for healthcare insurance through your employer.    How to apply: Eligibility screenings are held every Tuesday and  Wednesday afternoon from 1:00 pm until 4:00 pm. You do not need an appointment for the interview!  [facility] [address] [place], [place] [phone]   [facility] Health Department  [phone]   [facility] Health Department  [phone]   [facility] Health Department  [phone]     Behavioral Health Resources in the Community: Intensive Outpatient Programs Organization         Address  Phone  Notes  [facility] [address] [place], [place] [phone]   [facility] [address] [place], [place] [phone]   [facility] [address], [place], [place]  [phone]   [facility] [address]  [place], [place] [phone] or [phone]   Substance Abuse Resources Organization         Address  Phone  Notes  [facility]  [phone]   [facility]  [phone]   [facility] [place]  [phone]   Floydene Flock  [phone]   Residential & Outpatient Substance Abuse Program  [phone]   Psychological Services Organization         Address  Phone  Notes  [facility]  [phone]   [facility] Services  [phone]   [facility] Mental Health [address] [place] [phone] or [phone]    Mobile Crisis Teams Organization         Address  Phone  Notes  [facility], Mobile Crisis Care Unit  [phone]   [facility]  [address] [place], [place] [phone]   Doristine Locks [address] [place] [phone]    Self-Help/Support Groups Organization         Address  Phone             Notes  [facility] [place] - variety of support groups  [phone]- [phone] Call for more information  Narcotics Anonymous (NA), [facility] [address], [employer] [place]  2 meetings at this location   Statistician         Address  Phone  Notes  [facility] [address],    [place]  [phone]   [facility]  [address] [place] [address], [place], [place] [phone]   [facility] Treatment Facility [address] [place], [place] [phone] Admissions: 8am-3pm M-F  Incentives  [facility] [address],    [place], [place] [phone]   [facility] [address] [place], [place], [place] [phone]   [facility] [address],  [place], [place] [phone]   [facility] Intensive Outpatient [address], [place], [place] [phone]   [facility] [address] [place]  [place], [place] [phone] or [phone]   [facility] [address], [place], [place] [phone] Accepts Medicaid  [facility] [address]  [place] [phone] Substance Abuse/Addiction Treatment   [facility]         Address  Phone  Notes  [facility]  [phone]   Angie Fava, PhD [address] [place] [place] [place], [place]   [phone] or [phone]   [facility]   [address] [place], [place] [phone]   [facility] [address], [place], [place] [phone] Insurance/Medicaid/sponsorship through [employer]  and Families [address], [place], [place] [phone] [place], [place] [phone]    Dr. Adele Schilder  [phone]   [facility] 1) [address], [place] 2) [place] 3)  [address], [place] [phone]  [phone]   [place] [phone] or [phone] (After Hours)

## 2014-07-23 NOTE — ED Notes (Signed)
ARRANGED TRANSPORT WITH [facility] .

## 2014-07-23 NOTE — ED Notes (Addendum)
Pt reports having "pretty messed up thoughts" such as wanting to hurt his aunt because she will not let him go near his father, "I want to kill her, I want to beat her." Denies suicidal thoughts right now. Pt admits to ETOH use all weekend, denies illegal drug use.

## 2014-07-23 NOTE — ED Notes (Signed)
Telepsych in process 

## 2014-07-23 NOTE — ED Provider Notes (Signed)
CSN: [id number]     Arrival date & time 07/23/14  0865 History   First MD Initiated Contact with Patient 07/23/14 [phone]     Chief Complaint  Patient presents with  . Homicidal     (Consider location/radiation/quality/duration/timing/severity/associated sxs/prior Treatment) The history is provided by the patient.   patient states he is homicidal. States he wants to hurt his aunt. He states he has been coming off his Xanax and Klonopin. He states he has not had 2 weeks. He has also been drinking alcohol. He states his father has some medicine and his aunt will not let him go see his father. He states he wants to hurt her. He was seen earlier in the [facility] at [facility] for shoulder [facility]. He states he would also like to come off alcohol. He denies suicidal thoughts. No chest pain. No abdominal pain. Last used the benzos around 2 weeks ago. States he ran out of them because he does not have a doctor up here.  Past Medical History  Diagnosis Date  . Back pain   . Anxiety   . Mental disorder    Past Surgical History  Procedure Laterality Date  . Hernia repair     No family history on file. History  Substance Use Topics  . Smoking status: Current Every Day Smoker -- 0.50 packs/day    Types: Cigarettes  . Smokeless tobacco: Former Neurosurgeon  . Alcohol Use: Yes     Comment: pint of liquor a day     Review of Systems  Constitutional: Negative for activity change and appetite change.  Eyes: Negative for pain.  Respiratory: Negative for chest tightness and shortness of breath.   Cardiovascular: Negative for chest pain and leg swelling.  Gastrointestinal: Negative for nausea, vomiting, abdominal pain and diarrhea.  Genitourinary: Negative for flank pain.  Musculoskeletal: Negative for back pain and neck stiffness.       Shoulder pain  Skin: Negative for rash.  Neurological: Negative for weakness, numbness and headaches.  Psychiatric/Behavioral: Negative for behavioral problems.       Allergies  Morphine and related  Home Medications   Prior to Admission medications   Medication Sig Start Date End Date Taking? Authorizing Provider  ALPRAZolam Prudy Feeler) 1 MG tablet Take 1 mg by mouth daily as needed for anxiety.   Yes Historical Provider, MD  clonazePAM (KLONOPIN) 2 MG tablet Take 1-2 mg by mouth 3 (three) times daily as needed for anxiety. Takes 2 tablets to calm down and two more tablets right before going to bed.   Yes Historical Provider, MD  Fexofenadine HCl (MUCINEX ALLERGY PO) Take 1 tablet by mouth daily.   Yes Historical Provider, MD  cyclobenzaprine (FLEXERIL) 10 MG tablet Take 1 tablet (10 mg total) by mouth 3 (three) times daily as needed for muscle spasms. Patient not taking: Reported on 07/15/2014 07/09/14   Lyanne Co, MD  hydrOXYzine (ATARAX/VISTARIL) 25 MG tablet Take 2 tablets (50 mg total) by mouth every 6 (six) hours as needed for anxiety. Patient not taking: Reported on 07/09/2014 06/26/14   Bonnetta Barry, NP  ibuprofen (ADVIL,MOTRIN) 800 MG tablet Take 1 tablet (800 mg total) by mouth every 8 (eight) hours as needed for mild pain. Patient not taking: Reported on 07/23/2014 07/23/14   Layla Maw Ward, DO  mirtazapine (REMERON) 7.5 MG tablet Take 1 tablet (7.5 mg total) by mouth at bedtime. Patient not taking: Reported on 07/09/2014 06/26/14   Bonnetta Barry, NP  Multiple Vitamin (MULTIVITAMIN  WITH MINERALS) TABS tablet Take 1 tablet by mouth daily. For nutrition supplementation Patient not taking: Reported on 07/23/2014 06/26/14   Bonnetta Barry, NP   BP 128/80 mmHg  Pulse 82  Temp(Src) 98.6 F (37 C) (Oral)  Resp 18  Ht 6' (1.829 m)  Wt 280 lb (127.007 kg)  BMI 37.97 kg/m2  SpO2 98% Physical Exam  Constitutional: He is oriented to person, place, and time. He appears well-developed and well-nourished.  HENT:  Head: Normocephalic and atraumatic.  Eyes: EOM are normal. Pupils are equal, round, and reactive to light.  Neck: Normal range of  motion. Neck supple.  Cardiovascular: Normal rate, regular rhythm and normal heart sounds.   No murmur heard. Pulmonary/Chest: Effort normal and breath sounds normal.  Abdominal: Soft. Bowel sounds are normal. He exhibits no distension and no mass. There is no tenderness. There is no rebound and no guarding.  Musculoskeletal: Normal range of motion. He exhibits no edema.  Neurological: He is alert and oriented to person, place, and time. No cranial nerve deficit.  Skin: Skin is warm and dry.  Psychiatric: He has a normal mood and affect.  Nursing note and vitals reviewed.   ED Course  Procedures (including critical care time) Labs Review Labs Reviewed  ACETAMINOPHEN LEVEL - Abnormal; Notable for the following:    Acetaminophen (Tylenol), Serum <10.0 (*)    All other components within normal limits  CBC - Abnormal; Notable for the following:    Hemoglobin 17.4 (*)    All other components within normal limits  COMPREHENSIVE METABOLIC PANEL - Abnormal; Notable for the following:    Sodium 147 (*)    Glucose, Bld 109 (*)    BUN <5 (*)    AST 73 (*)    GFR calc non Af Amer 86 (*)    All other components within normal limits  ETHANOL - Abnormal; Notable for the following:    Alcohol, Ethyl (B) 233 (*)    All other components within normal limits  SALICYLATE LEVEL  URINE RAPID DRUG SCREEN (HOSP PERFORMED)    Imaging Review Dg Shoulder Left  07/22/2014   CLINICAL DATA:  Left shoulder pain. History of shoulder dislocations, most recently last night. Ongoing pain.  EXAM: LEFT SHOULDER - 2+ VIEW  COMPARISON:  Left shoulder 07/21/2008. MRI left shoulder 10/22/2009. CT left shoulder 10/24/2009.  FINDINGS: No evidence of glenohumeral dislocation. Acromioclavicular joint appears intact. No evidence of acute fracture. Coracoclavicular, sub acromial, and acromioclavicular spaces are maintained. Circumscribed well defined cystic structure along the greater at tuberosity likely represents  degenerative cyst although old fracture deformity seen obliquely could also have this appearance. No focal bone erosions or expansile lesions.  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 No evidence of acute fracture or dislocation per   Electronically Signed   By: Burman Nieves M.D.   On: 07/22/2014 23:38     EKG Interpretation None      MDM   Final diagnoses:  Homicidal ideations  Alcohol abuse    Patient with homicidal thoughts. Alcohol abuse. No suicidal thoughts. Has been accepted at behavioral health. Appears to be medically cleared.    Juliet Rude. Rubin Payor, MD 07/23/14 [phone]

## 2014-07-23 NOTE — ED Notes (Addendum)
Research scientist (physical sciences) at bedside and has patient within eyesight.

## 2014-07-23 NOTE — H&P (Signed)
Psychiatric Admission Assessment Adult  Patient Identification: Larry Adams MRN:  [id number] Date of Evaluation:  07/23/2014 Chief Complaint:  ETOH USE DISORDER BENZO USE DISORDER  Principal Diagnosis: Benzodiazepine & alcohol dependence  Diagnosis:   Patient Active Problem List   Diagnosis Date Noted  . Alcohol dependency [F10.20] 07/23/2014  . Alcohol dependence [F10.20] 06/25/2014   History of Present Illness: Larry Adams is a 29 year old Caucasian male. Admitted from the [facility]. He reports, "I was in a bad mood yesterday because me & my aunt got into it. We had it out with each other. I got very sad and had a bad thought that If I get my hands on her, I will hurt her. But, I went to the ED instead. I was also drinking alcohol at the time. My drinking worsened since the death of my cousin in 08-27-10 of drug overdose. I drink liquor & beer only when I ran out of my anxiety medicine. I take Klonopin for my anxiety, been on it x 7 years. Doctor [place] prescribes it for me, he is in [place]. It is hard to get my refills timely because he is so far away. Am I an alcoholic? yes, but, I'm not depressed. I have problem sleeping, does not want Trazodone. Gives me nightmares. I'm not suicidal of homicidal".  Elements:  Location:  Alcohol dependence, Generalized anxiety disorder. Quality:  Anxiousness, increased alcohol consumption, insomnia, argumentative, HI. Severity:  Severe. Timing:  Anxiety worsened yesterday morning. Duration:  Chronic anxiety since 27-Aug-2010. Context:  "I have bad anxiety, got into an argument with my aunt, had bad thoughts (HI), went to the ED.  Associated Signs/Symptoms:  Depression Symptoms:  anxiety, insomnia,  (Hypo) Manic Symptoms:  Denies  Anxiety Symptoms:  Excessive Worry,  Psychotic Symptoms:  Denies  PTSD Symptoms: NA  Total Time spent with patient: 1 hour  Past Medical History:  Past Medical History  Diagnosis Date  . Back pain    . Anxiety   . Mental disorder     Past Surgical History  Procedure Laterality Date  . Hernia repair     Family History: History reviewed. No pertinent family history. Social History:  History  Alcohol Use  . Yes    Comment: pint of liquor a day; varies     History  Drug Use  . 5.00 per week  . Special: Benzodiazepines    Comment: reports that he is on klonopin and xanax; hx of marijuana use but drug screen presently negative    History   Social History  . Marital Status: Single    Spouse Name: N/A    Number of Children: N/A  . Years of Education: N/A   Social History Main Topics  . Smoking status: Current Every Day Smoker -- 1.00 packs/day    Types: Cigarettes  . Smokeless tobacco: Former Systems developer  . Alcohol Use: Yes     Comment: pint of liquor a day; varies  . Drug Use: 5.00 per week    Special: Benzodiazepines     Comment: reports that he is on klonopin and xanax; hx of marijuana use but drug screen presently negative  . Sexual Activity: Yes   Other Topics Concern  . None   Social History Narrative   Additional Social History:    History of alcohol / drug use?: Yes Name of Substance 1: ETOH 1 - Age of First Use: teens 1 - Amount (size/oz): varies 1 - Frequency: daily only when out of medications  1 - Duration: on and off 1 - Last Use / Amount: 07-22-14 Name of Substance 2: xanax and klonopin 2 - Age of First Use: 20's 2 - Frequency: daily 2 - Duration: ongoing 2 - Last Use / Amount: days  Musculoskeletal: Strength & Muscle Tone: within normal limits Gait & Station: normal Patient leans: N/A  Psychiatric Specialty Exam: Physical Exam  Constitutional: He is oriented to person, place, and time. He appears well-developed.  Obese  HENT:  Head: Normocephalic.  Eyes: Pupils are equal, round, and reactive to light.  Neck: Normal range of motion.  Cardiovascular: Normal rate.   Respiratory: Effort normal.  GI: Soft.  Genitourinary:  Denies any issues  in this area  Musculoskeletal: Normal range of motion.  Neurological: He is alert and oriented to person, place, and time.  Skin: Skin is warm and dry.  Psychiatric: His speech is normal and behavior is normal. Thought content normal. His mood appears anxious (rates#10). His affect is not angry, not blunt, not labile and not inappropriate. Cognition and memory are normal. He expresses impulsivity. He does not exhibit a depressed mood.    Review of Systems  Constitutional: Positive for chills, malaise/fatigue and diaphoresis.  HENT: Negative.   Eyes: Negative.   Respiratory: Negative.   Cardiovascular: Negative.   Gastrointestinal: Positive for nausea and abdominal pain.  Genitourinary: Negative.   Musculoskeletal: Positive for myalgias.  Skin: Negative.   Neurological: Positive for dizziness, tremors and weakness.  Endo/Heme/Allergies: Negative.   Psychiatric/Behavioral: Positive for substance abuse (Alcoholism,). Negative for depression, suicidal ideas, hallucinations and memory loss. The patient is nervous/anxious and has insomnia.     Blood pressure 111/60, pulse 94, temperature 98.5 F (36.9 C), temperature source Oral, resp. rate 18, height 5' 10.34" (1.787 m), weight 136.533 kg (301 lb).Body mass index is 42.76 kg/(m^2).  General Appearance: Casual and obese  Eye Contact::  Good  Speech:  Clear and Coherent  Volume:  Normal  Mood:  Anxious and denies feeling depressed  Affect:  Appropriate  Thought Process:  Coherent and Intact  Orientation:  Full (Time, Place, and Person)  Thought Content:  Within normal  Suicidal Thoughts:  No  Homicidal Thoughts:  Denies, but present on admission to the ED  Memory:  Immediate;   Good Recent;   Good Remote;   Good  Judgement:  Fair  Insight:  Present  Psychomotor Activity:  Restlessness and Tremor  Concentration:  Fair  Recall:  Good  Fund of Knowledge:Fair  Language: Good  Akathisia:  No  Handed:  Right  AIMS (if indicated):      Assets:  Desire for Improvement  ADL's:  Intact  Cognition: WNL  Sleep:      Risk to Self: Is patient at risk for suicide?: No Risk to Others:   Prior Inpatient Therapy:   Prior Outpatient Therapy:    Alcohol Screening: 1. How often do you have a drink containing alcohol?: 4 or more times a week 2. How many drinks containing alcohol do you have on a typical day when you are drinking?: 5 or 6 3. How often do you have six or more drinks on one occasion?: Weekly Preliminary Score: 5 4. How often during the last year have you found that you were not able to stop drinking once you had started?: Weekly 5. How often during the last year have you failed to do what was normally expected from you becasue of drinking?: Weekly 6. How often during the last year have you  needed a first drink in the morning to get yourself going after a heavy drinking session?: Never 8. How often during the last year have you been unable to remember what happened the night before because you had been drinking?: Never 9. Have you or someone else been injured as a result of your drinking?: No 10. Has a relative or friend or a doctor or another health worker been concerned about your drinking or suggested you cut down?: No Alcohol Use Disorder Identification Test Final Score (AUDIT): 15 Brief Intervention: Yes  Allergies:   Allergies  Allergen Reactions  . Morphine And Related Hives   Lab Results:  Results for orders placed or performed during the hospital encounter of 07/23/14 (from the past 48 hour(s))  Acetaminophen level     Status: Abnormal   Collection Time: 07/23/14  4:16 AM  Result Value Ref Range   Acetaminophen (Tylenol), Serum <10.0 (L) 10 - 30 ug/mL    Comment:        THERAPEUTIC CONCENTRATIONS VARY SIGNIFICANTLY. A RANGE OF 10-30 ug/mL MAY BE AN EFFECTIVE CONCENTRATION FOR MANY PATIENTS. HOWEVER, SOME ARE BEST TREATED AT CONCENTRATIONS OUTSIDE THIS RANGE. ACETAMINOPHEN CONCENTRATIONS >150  ug/mL AT 4 HOURS AFTER INGESTION AND >50 ug/mL AT 12 HOURS AFTER INGESTION ARE OFTEN ASSOCIATED WITH TOXIC REACTIONS.   CBC     Status: Abnormal   Collection Time: 07/23/14  4:16 AM  Result Value Ref Range   WBC 8.3 4.0 - 10.5 K/uL   RBC 5.25 4.22 - 5.81 MIL/uL   Hemoglobin 17.4 (H) 13.0 - 17.0 g/dL   HCT 49.7 39.0 - 52.0 %   MCV 94.7 78.0 - 100.0 fL   MCH 33.1 26.0 - 34.0 pg   MCHC 35.0 30.0 - 36.0 g/dL   RDW 13.9 11.5 - 15.5 %   Platelets 193 150 - 400 K/uL  Comprehensive metabolic panel     Status: Abnormal   Collection Time: 07/23/14  4:16 AM  Result Value Ref Range   Sodium 147 (H) 135 - 145 mmol/L   Potassium 4.1 3.5 - 5.1 mmol/L   Chloride 110 96 - 112 mmol/L   CO2 30 19 - 32 mmol/L   Glucose, Bld 109 (H) 70 - 99 mg/dL   BUN <5 (L) 6 - 23 mg/dL   Creatinine, Ser 1.14 0.50 - 1.35 mg/dL   Calcium 9.6 8.4 - 10.5 mg/dL   Total Protein 7.1 6.0 - 8.3 g/dL   Albumin 3.7 3.5 - 5.2 g/dL   AST 73 (H) 0 - 37 U/L   ALT 51 0 - 53 U/L   Alkaline Phosphatase 77 39 - 117 U/L   Total Bilirubin 1.0 0.3 - 1.2 mg/dL   GFR calc non Af Amer 86 (L) >90 mL/min   GFR calc Af Amer >90 >90 mL/min    Comment: (NOTE) The eGFR has been calculated using the CKD EPI equation. This calculation has not been validated in all clinical situations. eGFR's persistently <90 mL/min signify possible Chronic Kidney Disease.    Anion gap 7 5 - 15  Ethanol (ETOH)     Status: Abnormal   Collection Time: 07/23/14  4:16 AM  Result Value Ref Range   Alcohol, Ethyl (B) 233 (H) 0 - 9 mg/dL    Comment:        LOWEST DETECTABLE LIMIT FOR SERUM ALCOHOL IS 11 mg/dL FOR MEDICAL PURPOSES ONLY   Salicylate level     Status: None   Collection Time: 07/23/14  4:16 AM  Result Value Ref Range   Salicylate Lvl <6.1 2.8 - 20.0 mg/dL  Urine Drug Screen     Status: Abnormal   Collection Time: 07/23/14 10:13 AM  Result Value Ref Range   Opiates NONE DETECTED NONE DETECTED   Cocaine NONE DETECTED NONE DETECTED    Benzodiazepines POSITIVE (A) NONE DETECTED   Amphetamines NONE DETECTED NONE DETECTED   Tetrahydrocannabinol NONE DETECTED NONE DETECTED   Barbiturates NONE DETECTED NONE DETECTED    Comment:        DRUG SCREEN FOR MEDICAL PURPOSES ONLY.  IF CONFIRMATION IS NEEDED FOR ANY PURPOSE, NOTIFY LAB WITHIN 5 DAYS.        LOWEST DETECTABLE LIMITS FOR URINE DRUG SCREEN Drug Class       Cutoff (ng/mL) Amphetamine      1000 Barbiturate      200 Benzodiazepine   950 Tricyclics       932 Opiates          300 Cocaine          300 THC              50    Current Medications: Current Facility-Administered Medications  Medication Dose Route Frequency Provider Last Rate Last Dose  . acetaminophen (TYLENOL) tablet 650 mg  650 mg Oral Q6H PRN Kennedy Bucker, NP      . alum & mag hydroxide-simeth (MAALOX/MYLANTA) 200-200-20 MG/5ML suspension 30 mL  30 mL Oral PRN Kennedy Bucker, NP      . hydrOXYzine (ATARAX/VISTARIL) tablet 25 mg  25 mg Oral Q6H PRN Kennedy Bucker, NP      . ibuprofen (ADVIL,MOTRIN) tablet 600 mg  600 mg Oral Q8H PRN Kennedy Bucker, NP      . loperamide (IMODIUM) capsule 2-4 mg  2-4 mg Oral PRN Kennedy Bucker, NP      . LORazepam (ATIVAN) tablet 1 mg  1 mg Oral QID Kennedy Bucker, NP   1 mg at 07/23/14 1652   Followed by  . [START ON 07/24/2014] LORazepam (ATIVAN) tablet 1 mg  1 mg Oral TID Kennedy Bucker, NP       Followed by  . [START ON 07/25/2014] LORazepam (ATIVAN) tablet 1 mg  1 mg Oral BID Kennedy Bucker, NP       Followed by  . [START ON 07/26/2014] LORazepam (ATIVAN) tablet 1 mg  1 mg Oral Daily Kennedy Bucker, NP      . magnesium hydroxide (MILK OF MAGNESIA) suspension 30 mL  30 mL Oral Daily PRN Kennedy Bucker, NP      . multivitamin with minerals tablet 1 tablet  1 tablet Oral Daily Kennedy Bucker, NP   1 tablet at 07/23/14 1225  . nicotine (NICODERM CQ - dosed in mg/24 hours) patch 21 mg  21 mg Transdermal Daily Kennedy Bucker, NP   21 mg at 07/23/14 1226  . ondansetron  (ZOFRAN-ODT) disintegrating tablet 4 mg  4 mg Oral Q6H PRN Kennedy Bucker, NP   4 mg at 07/23/14 1653  . thiamine (VITAMIN B-1) tablet 100 mg  100 mg Oral Daily Kennedy Bucker, NP   100 mg at 07/23/14 1227  . zolpidem (AMBIEN) tablet 5 mg  5 mg Oral QHS PRN Kennedy Bucker, NP       PTA Medications: Prescriptions prior to admission  Medication Sig Dispense Refill Last Dose  . ALPRAZolam (XANAX) 1 MG tablet Take 1 mg by mouth daily as needed for anxiety.   Past Month at Unknown time  . clonazePAM (  KLONOPIN) 2 MG tablet Take 1-2 mg by mouth 3 (three) times daily as needed for anxiety. Takes 2 tablets to calm down and two more tablets right before going to bed.   07/22/2014 at Unknown time  . cyclobenzaprine (FLEXERIL) 10 MG tablet Take 1 tablet (10 mg total) by mouth 3 (three) times daily as needed for muscle spasms. (Patient not taking: Reported on 07/15/2014) 15 tablet 0 Not Taking at Unknown time  . Fexofenadine HCl (MUCINEX ALLERGY PO) Take 1 tablet by mouth daily.   Past Week at Unknown time  . hydrOXYzine (ATARAX/VISTARIL) 25 MG tablet Take 2 tablets (50 mg total) by mouth every 6 (six) hours as needed for anxiety. (Patient not taking: Reported on 07/09/2014) 60 tablet 0 Not Taking at Unknown time  . ibuprofen (ADVIL,MOTRIN) 800 MG tablet Take 1 tablet (800 mg total) by mouth every 8 (eight) hours as needed for mild pain. (Patient not taking: Reported on 07/23/2014) 30 tablet 0   . mirtazapine (REMERON) 7.5 MG tablet Take 1 tablet (7.5 mg total) by mouth at bedtime. (Patient not taking: Reported on 07/09/2014) 30 tablet 0 Not Taking at Unknown time  . Multiple Vitamin (MULTIVITAMIN WITH MINERALS) TABS tablet Take 1 tablet by mouth daily. For nutrition supplementation (Patient not taking: Reported on 07/23/2014)   07/22/2014 at Unknown time   Previous Psychotropic Medications: Yes   Substance Abuse History in the last 12 months:  Yes.    Consequences of Substance Abuse: Medical Consequences:  Liver  damage, Possible death by overdose Legal Consequences:  Arrests, jail time, Loss of driving privilege. Family Consequences:  Family discord, divorce and or separation.  Results for orders placed or performed during the hospital encounter of 07/23/14 (from the past 72 hour(s))  Acetaminophen level     Status: Abnormal   Collection Time: 07/23/14  4:16 AM  Result Value Ref Range   Acetaminophen (Tylenol), Serum <10.0 (L) 10 - 30 ug/mL    Comment:        THERAPEUTIC CONCENTRATIONS VARY SIGNIFICANTLY. A RANGE OF 10-30 ug/mL MAY BE AN EFFECTIVE CONCENTRATION FOR MANY PATIENTS. HOWEVER, SOME ARE BEST TREATED AT CONCENTRATIONS OUTSIDE THIS RANGE. ACETAMINOPHEN CONCENTRATIONS >150 ug/mL AT 4 HOURS AFTER INGESTION AND >50 ug/mL AT 12 HOURS AFTER INGESTION ARE OFTEN ASSOCIATED WITH TOXIC REACTIONS.   CBC     Status: Abnormal   Collection Time: 07/23/14  4:16 AM  Result Value Ref Range   WBC 8.3 4.0 - 10.5 K/uL   RBC 5.25 4.22 - 5.81 MIL/uL   Hemoglobin 17.4 (H) 13.0 - 17.0 g/dL   HCT 49.7 39.0 - 52.0 %   MCV 94.7 78.0 - 100.0 fL   MCH 33.1 26.0 - 34.0 pg   MCHC 35.0 30.0 - 36.0 g/dL   RDW 13.9 11.5 - 15.5 %   Platelets 193 150 - 400 K/uL  Comprehensive metabolic panel     Status: Abnormal   Collection Time: 07/23/14  4:16 AM  Result Value Ref Range   Sodium 147 (H) 135 - 145 mmol/L   Potassium 4.1 3.5 - 5.1 mmol/L   Chloride 110 96 - 112 mmol/L   CO2 30 19 - 32 mmol/L   Glucose, Bld 109 (H) 70 - 99 mg/dL   BUN <5 (L) 6 - 23 mg/dL   Creatinine, Ser 1.14 0.50 - 1.35 mg/dL   Calcium 9.6 8.4 - 10.5 mg/dL   Total Protein 7.1 6.0 - 8.3 g/dL   Albumin 3.7 3.5 - 5.2 g/dL  AST 73 (H) 0 - 37 U/L   ALT 51 0 - 53 U/L   Alkaline Phosphatase 77 39 - 117 U/L   Total Bilirubin 1.0 0.3 - 1.2 mg/dL   GFR calc non Af Amer 86 (L) >90 mL/min   GFR calc Af Amer >90 >90 mL/min    Comment: (NOTE) The eGFR has been calculated using the CKD EPI equation. This calculation has not been validated  in all clinical situations. eGFR's persistently <90 mL/min signify possible Chronic Kidney Disease.    Anion gap 7 5 - 15  Ethanol (ETOH)     Status: Abnormal   Collection Time: 07/23/14  4:16 AM  Result Value Ref Range   Alcohol, Ethyl (B) 233 (H) 0 - 9 mg/dL    Comment:        LOWEST DETECTABLE LIMIT FOR SERUM ALCOHOL IS 11 mg/dL FOR MEDICAL PURPOSES ONLY   Salicylate level     Status: None   Collection Time: 07/23/14  4:16 AM  Result Value Ref Range   Salicylate Lvl <7.8 2.8 - 20.0 mg/dL  Urine Drug Screen     Status: Abnormal   Collection Time: 07/23/14 10:13 AM  Result Value Ref Range   Opiates NONE DETECTED NONE DETECTED   Cocaine NONE DETECTED NONE DETECTED   Benzodiazepines POSITIVE (A) NONE DETECTED   Amphetamines NONE DETECTED NONE DETECTED   Tetrahydrocannabinol NONE DETECTED NONE DETECTED   Barbiturates NONE DETECTED NONE DETECTED    Comment:        DRUG SCREEN FOR MEDICAL PURPOSES ONLY.  IF CONFIRMATION IS NEEDED FOR ANY PURPOSE, NOTIFY LAB WITHIN 5 DAYS.        LOWEST DETECTABLE LIMITS FOR URINE DRUG SCREEN Drug Class       Cutoff (ng/mL) Amphetamine      1000 Barbiturate      200 Benzodiazepine   242 Tricyclics       353 Opiates          300 Cocaine          300 THC              50     Observation Level/Precautions:  15 minute checks  Laboratory:  Per ED  Psychotherapy:  Group sessions  Medications:  See medication lists  Consultations: As needed   Discharge Concerns: Maintaining sobriety   Estimated LOS: 3-5 days  Other:     Psychological Evaluations: Yes   Treatment Plan Summary: Daily contact with patient to assess and evaluate symptoms and progress in treatment, Medication management and Plan 1. Admit for crisis management and stabilization, estimated length of stay 3-5 days.  2. Medication management to reduce current symptoms to base line and improve the patient's overall level of functioning; continue Ativan detox protocols, Ambien for  sleep.  3. Treat health problems as indicated.  4. Develop treatment plan to decrease risk of relapse upon discharge and the need for readmission.  5. Psycho-social education regarding relapse prevention and self care.  6. Health care follow up as needed for medical problems.  7. Review, reconcile, and reinstate any pertinent home medications for other health issues where appropriate. 8. Call for consults with hospitalist for any additional specialty patient care services as needed.   Medical Decision Making:  New problem, with additional work up planned, Review of Psycho-Social Stressors (1), Review of Medication Regimen & Side Effects (2) and Review of New Medication or Change in Dosage (2)  I certify that inpatient services furnished can  reasonably be expected to improve the patient's condition.   Encarnacion Slates, PMHNP, FNP-BC 1/25/20166:04 PM I personally assessed the patient, reviewed the physical exam and labs and formulated the treatment plan Geralyn Flash A. Sabra Heck, M.D.

## 2014-07-23 NOTE — Clinical Social Work Note (Signed)
CSW attempted to meet with patient to complete PSA.  Patient in bed and not feeling well.

## 2014-07-23 NOTE — BH Assessment (Signed)
Received notification of TTS consult. Spoke to Dr. Rubin Payor who said Pt has a history of abusing alcohol and benzodiazepines. He reports homicidal ideation towards his aunt. Tele-assessment will be initiated.  Harlin Rain Ria Comment, [facility] Triage Specialist [phone]

## 2014-07-23 NOTE — Tx Team (Addendum)
Initial Interdisciplinary Treatment Plan   PATIENT STRESSORS: Financial difficulties Marital or family conflict Medication change or noncompliance Substance abuse   PATIENT STRENGTHS: Ability for insight Average or above average intelligence Capable of independent living Licensed conveyancer Physical Health   PROBLEM LIST: Problem List/Patient Goals Date to be addressed Date deferred Reason deferred Estimated date of resolution  " Thoughts to kill my aunt" 07/23/14     " Anxiety issues, ran out of medications and started drinking to help with my anxiety" 07/23/14           Risk for suicide                                     DISCHARGE CRITERIA:  Ability to meet basic life and health needs Adequate post-discharge living arrangements Withdrawal symptoms are absent or subacute and managed without 24-hour nursing intervention  PRELIMINARY DISCHARGE PLAN: Outpatient therapy Return to previous living arrangement  PATIENT/FAMIILY INVOLVEMENT: This treatment plan has been presented to and reviewed with the patient, Larry Adams, and/or family member, .  The patient and family have been given the opportunity to ask questions and make suggestions.  Manuela Schwartz [facility] 07/23/2014, 12:53 PM

## 2014-07-23 NOTE — [facility] Group Notes (Signed)
[facility] LCSW Group Therapy   Overcoming Obstacles 1:15 - 2:30 PM 07/23/2014 2:35 PM  Type of Therapy:  Group Therapy  Participation Level:  Did Not Attend - patient new to the unit - in bed.  Wynn Banker 07/23/2014, 2:35 PM

## 2014-07-23 NOTE — Progress Notes (Signed)
Psychoeducational Group Note  Date:  07/23/2014 Time:  2354  Group Topic/Focus:  Wrap-Up Group:   The focus of this group is to help patients review their daily goal of treatment and discuss progress on daily workbooks.  Participation Level: Did Not Attend  Participation Quality:  Not Applicable  Affect:  Not Applicable  Cognitive:  Not Applicable  Insight:  Not Applicable  Engagement in Group: Not Applicable  Additional Comments: The patient did not attend group this evening since he elected to remain in bed.   Hazle Coca S 07/23/2014, 11:54 PM

## 2014-07-23 NOTE — ED Notes (Signed)
Pt placed in purple scrubs. Security at bedside to wand patient.

## 2014-07-23 NOTE — Progress Notes (Signed)
D:Patient bed on first approach.  Patient states she he constantly complains of left shoulder pain.  Patient states he continues to have withdrawal symptoms.  Patient states he does not feel that Ativan is strong enough.  Patient withdrawal symptoms minimal.  Patient states his goal is to feel better but states he has not gotten there yet.  Patient denies SI/HI and denies AVH.   A: Staff to monitor Q 15 mins for safety.  Encouragement and support offered.  Scheduled medications administered per orders.  Ambien administered prn for sleep. R: Patient remains safe on the unit.  Patient did not attend group tonight.  Patient visible on the unit for medications.  Patient taking administered medications.

## 2014-07-23 NOTE — B[facility] Suicide Risk Assessment (Signed)
[facility] Admission Suicide Risk Assessment   Nursing information obtained from:  Patient, Review of record Demographic factors:  Male, Adolescent or young adult, Caucasian, Low socioeconomic status, Access to firearms Current Mental Status:  Thoughts of violence towards others Loss Factors:  Loss of significant relationship (reports aunt will not let him speak with his father) Historical Factors:  Impulsivity Risk Reduction Factors:  Employed, Positive therapeutic relationship Total Time spent with patient: 45 minutes Principal Problem: <principal problem not specified> Diagnosis:   Patient Active Problem List   Diagnosis Date Noted  . 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 Alcohol abuse [F10.10] 07/23/2014     Continued Clinical Symptoms:  Alcohol Use Disorder Identification Test Final Score (AUDIT): 15 The "Alcohol Use Disorders Identification Test", Guidelines for Use in Primary Care, Second Edition.  World Science writer [facility]). Score between 0-7:  no or low risk or alcohol related problems. Score between 8-15:  moderate risk of alcohol related problems. Score between 16-19:  high risk of alcohol related problems. Score 20 or above:  warrants further diagnostic evaluation for alcohol dependence and treatment.   CLINICAL FACTORS:   Severe Anxiety and/or Agitation Alcohol/Substance Abuse/Dependencies   Musculoskeletal: Strength & Muscle Tone: within normal limits Gait & Station: normal Patient leans: N/A  Psychiatric Specialty Exam: Physical Exam  Review of Systems  Constitutional: Negative.   HENT: Negative.   Eyes: Negative.   Respiratory: Negative.   Cardiovascular: Negative.   Gastrointestinal: Negative.   Genitourinary: Negative.   Musculoskeletal: Negative.   Skin: Negative.   Neurological: Negative.   Endo/Heme/Allergies: Negative.   Psychiatric/Behavioral: Positive for substance abuse. The patient is nervous/anxious.     Blood pressure 111/60,  pulse 94, temperature 98.5 F (36.9 C), temperature source Oral, resp. rate 18, height 5' 10.34" (1.787 m), weight 136.533 kg (301 lb).Body mass index is 42.76 kg/(m^2).  General Appearance: Disheveled  Eye Solicitor::  Fair  Speech:  Clear and Coherent  Volume:  Decreased  Mood:  Anxious and worried  Affect:  anxious worried  Thought Process:  Coherent and Goal Directed  Orientation:  Full (Time, Place, and Person)  Thought Content:  symptoms events worries concerns  Suicidal Thoughts:  No  Homicidal Thoughts:  No  Memory:  Immediate;   Fair Recent;   Fair Remote;   Fair  Judgement:  Fair  Insight:  Present and Shallow  Psychomotor Activity:  Restlessness  Concentration:  Fair  Recall:  [employer] of Knowledge:Fair  Language: Fair  Akathisia:  No  Handed:  Right  AIMS (if indicated):     Assets:  Desire for Improvement Housing  Sleep:     Cognition: WNL  ADL's:  Intact     COGNITIVE FEATURES THAT CONTRIBUTE TO RISK:  Closed-mindedness, Polarized thinking and Thought constriction (tunnel vision)    SUICIDE RISK:   Moderate:   PLAN OF CARE: Supportive approach/coping skills/relapse prevention                               Benzodiazepine dependence/Alcohol Abuse: Detox with Ativan                               Anxiety, agitation Reassess to R/O substance induced vs co morbidity  Medical Decision Making:  Review or order clinical lab tests (1), Review of Medication Regimen & Side Effects (2) and Review of New Medication or Change in Dosage (2)  I certify that inpatient services furnished can reasonably be expected to improve the patient's condition.   Caydn Justen A 07/23/2014, 7:37 PM

## 2014-07-23 NOTE — ED Notes (Signed)
[facility] has arrived to transport pt to [facility] with his valuables and belongings

## 2014-07-24 DIAGNOSIS — F1010 Alcohol abuse, uncomplicated: Secondary | ICD-10-CM

## 2014-07-24 DIAGNOSIS — F1994 Other psychoactive substance use, unspecified with psychoactive substance-induced mood disorder: Secondary | ICD-10-CM

## 2014-07-24 NOTE — Tx Team (Signed)
Interdisciplinary Treatment Plan Update   Date Reviewed:  07/24/2014  Time Reviewed:  8:41 AM  Progress in Treatment:   Attending groups: Patient is new to the [facility]. Participating in groups: Patient is new to the [facility]. Taking medication as prescribed: Yes  Tolerating medication: Yes Family/Significant other contact made:  No, but will ask patient for consent for collateral contact Patient understands diagnosis: Yes  Discussing patient identified problems/goals with staff: Yes Medical problems stabilized or resolved: Yes Denies suicidal/homicidal ideation: Yes Patient has not harmed self or others: Yes  For review of initial/current patient goals, please see plan of care.  Estimated Length of Stay:  3-5 days  Reasons for Continued Hospitalization:  Anxiety Depression Medication stabilization uicidal ideation  New Problems/Goals identified:    Discharge Plan or Barriers:   Home with outpatient follow up to be determined  Additional Comments:  Larry Adams is a 29 year old Caucasian male. Admitted from the [facility]. He reports, "I was in a bad mood yesterday because me & my aunt got into it. We had it out with each other. I got very sad and had a bad thought that If I get my hands on her, I will hurt her. But, I went to the ED instead. I was also drinking alcohol at the time. My drinking worsened since the death of my cousin in 2010/11/04 of drug overdose. I drink liquor & beer only when I ran out of my anxiety medicine. I take Klonopin for my anxiety, been on it x 7 years. Doctor [place] prescribes it for me, he is in [place]. It is hard to get my refills timely because he is so far away. Am I an alcoholic? yes, but, I'm not depressed. I have problem sleeping, does not want Trazodone. Gives me nightmares. I'm not suicidal of homicidal".      Patient and CSW reviewed patient's identified goals and treatment plan.  Patient verbalized understanding and agreed to treatment  plan.   Attendees:  Patient:  07/24/2014 8:41 AM   Signature:  Sallyanne Havers, MD 07/24/2014 8:41 AM  Signature: Geoffery Lyons, MD 07/24/2014 8:41 AM  Signature:  Robbie Louis, RN 07/24/2014 8:41 AM  Signature:Beverly Terrilee Croak, RN 07/24/2014 8:41 AM  Signature:   07/24/2014 8:41 AM  Signature:  Juline Patch, LCSW 07/24/2014 8:41 AM  Signature:  Belenda Cruise Drinkard, LCSW-A 07/24/2014 8:41 AM  Signature:  Leisa Lenz, Care Coordinator [facility] 07/24/2014 8:41 AM  Signature:   07/24/2014 8:41 AM  Signature:  07/24/2014  8:41 AM  Signature:   Onnie Boer, RN [facility] 07/24/2014  8:41 AM  Signature: 07/24/2014  8:41 AM    Scribe for Treatment Team:   Juline Patch,  07/24/2014 8:41 AM

## 2014-07-24 NOTE — Progress Notes (Signed)
Recreation Therapy Notes  Animal-Assisted Activity/Therapy (AAA/T) Program Checklist/Progress Notes Patient Eligibility Criteria Checklist & Daily Group note for Rec Tx Intervention  Date: 01.26.2016 Time: 2:45pm Location: [address]   AAA/T Program Assumption of Risk Form signed by Patient/ or Parent Legal Guardian yes  Patient is free of allergies or sever asthma yes  Patient reports no fear of animals yes  Patient reports no history of cruelty to animals yes  Patient understands his/her participation is voluntary yes  Behavioral Response: Did not attend.   Marykay Lex Fedrick Cefalu, LRT/CTRS  Jearl Klinefelter 07/24/2014 4:37 PM

## 2014-07-24 NOTE — [facility] Suicide Risk Assessment (Signed)
[facility] INPATIENT:  Family/Significant Other Suicide Prevention Education  Suicide Prevention Education:  Patient Refusal for Family/Significant Other Suicide Prevention Education: The patient Larry Adams has refused to provide written consent for family/significant other to be provided Family/Significant Other Suicide Prevention Education during admission and/or prior to discharge.  Physician notified.  Wynn Banker 07/24/2014, 10:59 AM

## 2014-07-24 NOTE — Progress Notes (Signed)
Pt ha been in bed all day except for meals and medications.  He rated depression 4 denied and hopelessness and anxiety a 8 on his self-inventory. He denied any S/H ideation or A/V/H. He never really talked about his discharge plans today.

## 2014-07-24 NOTE — [facility] Group Notes (Signed)
[facility] LCSW Group Therapy  Feelings Around Diagnosis 1:15 - 2:30 pm 07/24/2014 2:34 PM  Type of Therapy:  Group Therapy  Participation Level:  Did Not Attend - patient in bed.  Wynn Banker 07/24/2014, 2:34 PM

## 2014-07-24 NOTE — [facility] Group Notes (Signed)
0900 nursing orientation group   The focus of this group is to educate the patient on the purpose and policies of crisis stabilization and provide a format to answer questions about their admission.  The group details unit policies and expectations of patients while admitted.  Pt did not attend he was in his bed asleep. 

## 2014-07-24 NOTE — Plan of Care (Signed)
Problem: Diagnosis: Increased Risk For Suicide Attempt Goal: STG-Patient Will Report Suicidal Feelings to Staff Outcome: Progressing Pt denied any suicidal ideation on his self-inventory and does verbally contract to come to staff before acting on any self-harm thoughts.
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
## 2014-07-24 NOTE — Progress Notes (Signed)
[facility] MD Progress Note  07/24/2014 6:16 PM Larry Adams  MRN:  [id number] Subjective:  Larry Adams continues to be detox from alcohol/benzodiazepines. He has been isolating. Claims he has not have an appetite. He states he really does not want to hurt his aunt. States she is not well, she has been diagnosed with Schizophrenia and she keeps him from being able to communicate with his father. Admits that this last time he was wanting benzodiazepines from him. He is not sure how he is going to do when he gets out of here. States he hopes to be able to abstain Principal Problem: Benzodiazepine Dependence/Alcohol Abuse/substance induced anxiety-mood disorder Diagnosis:   Patient Active Problem List   Diagnosis Date Noted  . 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 Alcohol abuse [F10.10] 07/23/2014  . Substance induced mood disorder [F19.94] 07/23/2014  . 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 Back pain   . Anxiety   . Mental disorder     Past Surgical History  Procedure Laterality Date  . Hernia repair     Family History: History reviewed. No pertinent family history. Social History:  History  Alcohol Use  . Yes    Comment: pint of liquor a day; varies     History  Drug Use  . 5.00 per week  . Special: Benzodiazepines    Comment: reports that he is on klonopin and xanax; hx of marijuana use but drug screen presently negative    History   Social History  . Marital Status: Single    Spouse Name: N/A    Number of Children: N/A  . Years of Education: N/A   Social History Main Topics  . Smoking status: Current Every Day Smoker -- 1.00 packs/day    Types: Cigarettes  . Smokeless tobacco: Former Systems developer  . Alcohol Use: Yes     Comment: pint of liquor a day; varies  . Drug Use: 5.00 per week    Special: Benzodiazepines     Comment: reports that he  is on klonopin and xanax; hx of marijuana use but drug screen presently negative  . Sexual Activity: Yes   Other Topics Concern  . None   Social History Narrative   Additional History:    Sleep: Fair  Appetite:  Poor   Assessment:   Musculoskeletal: Strength & Muscle Tone: within normal limits Gait & Station: normal Patient leans: N/A   Psychiatric Specialty Exam: Physical Exam  Review of Systems  Constitutional: Positive for malaise/fatigue.  HENT: Negative.   Eyes: Negative.   Respiratory: Negative.   Cardiovascular: Negative.   Gastrointestinal: Negative.   Genitourinary: Negative.   Musculoskeletal: Negative.   Skin: Negative.   Neurological: Positive for weakness.  Endo/Heme/Allergies: Negative.   Psychiatric/Behavioral: Positive for substance abuse. The patient is nervous/anxious.     Blood pressure 113/72, pulse 91, temperature 97.4 F (36.3 C), temperature source Oral, resp. rate 18, height 5' 10.34" (1.787 m), weight 136.533 kg (301 lb).Body mass index is 42.76 kg/(m^2).  General Appearance: Disheveled  Eye Contact::  Minimal  Speech:  Clear and Coherent and not spontaneous  Volume:  Decreased  Mood:  Anxious and Depressed  Affect:  Restricted  Thought Process:  Coherent and Goal Directed  Orientation:  Full (Time, Place, and Person)  Thought Content:  events symptoms worries concerns  Suicidal Thoughts:  No  Homicidal Thoughts:  No  Memory:  Immediate;   Fair Recent;   Fair Remote;   Fair  Judgement:  Fair  Insight:  Present and Shallow  Psychomotor Activity:  Normal  Concentration:  Fair  Recall:  [employer] of Knowledge:Fair  Language: Fair  Akathisia:  No  Handed:  Right  AIMS (if indicated):     Assets:  Desire for Improvement Housing Vocational/Educational  ADL's:  Intact  Cognition: WNL  Sleep:  Number of Hours: 6.5     Current Medications: Current Facility-Administered Medications  Medication Dose Route Frequency Provider  Last Rate Last Dose  . acetaminophen (TYLENOL) tablet 650 mg  650 mg Oral Q6H PRN Kennedy Bucker, NP      . alum & mag hydroxide-simeth (MAALOX/MYLANTA) 200-200-20 MG/5ML suspension 30 mL  30 mL Oral PRN Kennedy Bucker, NP      . hydrOXYzine (ATARAX/VISTARIL) tablet 25 mg  25 mg Oral Q6H PRN Kennedy Bucker, NP      . ibuprofen (ADVIL,MOTRIN) tablet 600 mg  600 mg Oral Q8H PRN Kennedy Bucker, NP   600 mg at 07/24/14 0834  . loperamide (IMODIUM) capsule 2-4 mg  2-4 mg Oral PRN Kennedy Bucker, NP      . Derrill Memo ON 07/25/2014] LORazepam (ATIVAN) tablet 1 mg  1 mg Oral BID Kennedy Bucker, NP       Followed by  . [START ON 07/26/2014] LORazepam (ATIVAN) tablet 1 mg  1 mg Oral Daily Kennedy Bucker, NP      . magnesium hydroxide (MILK OF MAGNESIA) suspension 30 mL  30 mL Oral Daily PRN Kennedy Bucker, NP      . multivitamin with minerals tablet 1 tablet  1 tablet Oral Daily Kennedy Bucker, NP   1 tablet at 07/24/14 0830  . nicotine (NICODERM CQ - dosed in mg/24 hours) patch 21 mg  21 mg Transdermal Daily Kennedy Bucker, NP   21 mg at 07/24/14 0831  . ondansetron (ZOFRAN-ODT) disintegrating tablet 4 mg  4 mg Oral Q6H PRN Kennedy Bucker, NP   4 mg at 07/23/14 1653  . thiamine (VITAMIN B-1) tablet 100 mg  100 mg Oral Daily Kennedy Bucker, NP   100 mg at 07/24/14 0830  . zolpidem (AMBIEN) tablet 5 mg  5 mg Oral QHS PRN Kennedy Bucker, NP   5 mg at 07/23/14 2147    Lab Results:  Results for orders placed or performed during the hospital encounter of 07/23/14 (from the past 48 hour(s))  Acetaminophen level     Status: Abnormal   Collection Time: 07/23/14  4:16 AM  Result Value Ref Range   Acetaminophen (Tylenol), Serum <10.0 (L) 10 - 30 ug/mL    Comment:        THERAPEUTIC CONCENTRATIONS VARY SIGNIFICANTLY. A RANGE OF 10-30 ug/mL MAY BE AN EFFECTIVE CONCENTRATION FOR MANY PATIENTS. HOWEVER, SOME ARE BEST TREATED AT CONCENTRATIONS OUTSIDE THIS RANGE. ACETAMINOPHEN CONCENTRATIONS >150 ug/mL AT 4 HOURS  AFTER INGESTION AND >50 ug/mL AT 12 HOURS AFTER INGESTION ARE OFTEN ASSOCIATED WITH TOXIC REACTIONS.   CBC     Status: Abnormal   Collection Time: 07/23/14  4:16 AM  Result Value Ref Range   WBC 8.3 4.0 - 10.5 K/uL   RBC 5.25 4.22 - 5.81 MIL/uL   Hemoglobin 17.4 (H) 13.0 - 17.0 g/dL   HCT 49.7 39.0 - 52.0 %   MCV 94.7 78.0 - 100.0 fL   MCH 33.1 26.0 - 34.0 pg   MCHC 35.0 30.0 - 36.0 g/dL   RDW 13.9  11.5 - 15.5 %   Platelets 193 150 - 400 K/uL  Comprehensive metabolic panel     Status: Abnormal   Collection Time: 07/23/14  4:16 AM  Result Value Ref Range   Sodium 147 (H) 135 - 145 mmol/L   Potassium 4.1 3.5 - 5.1 mmol/L   Chloride 110 96 - 112 mmol/L   CO2 30 19 - 32 mmol/L   Glucose, Bld 109 (H) 70 - 99 mg/dL   BUN <5 (L) 6 - 23 mg/dL   Creatinine, Ser 1.14 0.50 - 1.35 mg/dL   Calcium 9.6 8.4 - 10.5 mg/dL   Total Protein 7.1 6.0 - 8.3 g/dL   Albumin 3.7 3.5 - 5.2 g/dL   AST 73 (H) 0 - 37 U/L   ALT 51 0 - 53 U/L   Alkaline Phosphatase 77 39 - 117 U/L   Total Bilirubin 1.0 0.3 - 1.2 mg/dL   GFR calc non Af Amer 86 (L) >90 mL/min   GFR calc Af Amer >90 >90 mL/min    Comment: (NOTE) The eGFR has been calculated using the CKD EPI equation. This calculation has not been validated in all clinical situations. eGFR's persistently <90 mL/min signify possible Chronic Kidney Disease.    Anion gap 7 5 - 15  Ethanol (ETOH)     Status: Abnormal   Collection Time: 07/23/14  4:16 AM  Result Value Ref Range   Alcohol, Ethyl (B) 233 (H) 0 - 9 mg/dL    Comment:        LOWEST DETECTABLE LIMIT FOR SERUM ALCOHOL IS 11 mg/dL FOR MEDICAL PURPOSES ONLY   Salicylate level     Status: None   Collection Time: 07/23/14  4:16 AM  Result Value Ref Range   Salicylate Lvl <3.2 2.8 - 20.0 mg/dL  Urine Drug Screen     Status: Abnormal   Collection Time: 07/23/14 10:13 AM  Result Value Ref Range   Opiates NONE DETECTED NONE DETECTED   Cocaine NONE DETECTED NONE DETECTED   Benzodiazepines  POSITIVE (A) NONE DETECTED   Amphetamines NONE DETECTED NONE DETECTED   Tetrahydrocannabinol NONE DETECTED NONE DETECTED   Barbiturates NONE DETECTED NONE DETECTED    Comment:        DRUG SCREEN FOR MEDICAL PURPOSES ONLY.  IF CONFIRMATION IS NEEDED FOR ANY PURPOSE, NOTIFY LAB WITHIN 5 DAYS.        LOWEST DETECTABLE LIMITS FOR URINE DRUG SCREEN Drug Class       Cutoff (ng/mL) Amphetamine      1000 Barbiturate      200 Benzodiazepine   355 Tricyclics       732 Opiates          300 Cocaine          300 THC              50     Physical Findings: AIMS: Facial and Oral Movements Muscles of Facial Expression: None, normal Lips and Perioral Area: None, normal Jaw: None, normal Tongue: None, normal,Extremity Movements Upper (arms, wrists, hands, fingers): Minimal (withdrawals) Lower (legs, knees, ankles, toes): Minimal (withdrawals), Trunk Movements Neck, shoulders, hips: None, normal, Overall Severity Severity of abnormal movements (highest score from questions above): None, normal Incapacitation due to abnormal movements: None, normal Patient's awareness of abnormal movements (rate only patient's report): No Awareness, Dental Status Current problems with teeth and/or dentures?: No Does patient usually wear dentures?: No  CIWA:  CIWA-Ar Total: 14 COWS:     Treatment Plan  Summary: Daily contact with patient to assess and evaluate symptoms and progress in treatment and Medication management Supportive approach/coping skills Benzodiazepine Dependence/Alcohol Abuse: continue Ativan Detox. Develop ways of handling the anxiety through strategies other than depending on medications (CBT, mindfulness). Identify triggers, work a relapse prevention plan  Medical Decision Making:  Review of Psycho-Social Stressors (1) and Review of Medication Regimen & Side Effects (2)     Rashiya Lofland A 07/24/2014, 6:17 PM

## 2014-07-25 DIAGNOSIS — F19980 Other psychoactive substance use, unspecified with psychoactive substance-induced anxiety disorder: Secondary | ICD-10-CM

## 2014-07-25 MED ORDER — IBUPROFEN 600 MG PO TABS
600.0000 mg | ORAL_TABLET | Freq: Three times a day (TID) | ORAL | Status: DC | PRN
  Administered 2014-07-26 (×2): 600 mg via ORAL
  Filled 2014-07-25 (×2): qty 1

## 2014-07-25 NOTE — Plan of Care (Signed)
Problem: Ineffective individual coping Goal: LTG: Patient will report a decrease in negative feelings Outcome: Progressing Pt stated he was "good" today, and is feeling better than he did yesterday  Problem: Aggression Towards others,Towards Self, and or Destruction Goal: STG-Patient will comply with prescribed medication regimen (Patient will comply with prescribed medication regimen)  Outcome: Progressing Pt stated he will take his medications as prescribed.

## 2014-07-25 NOTE — Progress Notes (Signed)
D: Pt denies SI/HI/AVH. Pt is pleasant and cooperative. Pt continues to be flat, but stated his day was "good". Pt continues to forward little, but does interact on the unit with other patients at times.   A: Pt was offered support and encouragement. Pt was given scheduled medications. Pt was encourage to attend groups. Q 15 minute checks were done for safety.   R:Pt attends groups and interacts well with peers and staff. Pt is taking medication.Pt receptive to treatment and safety maintained on unit.

## 2014-07-25 NOTE — Plan of Care (Signed)
Problem: Ineffective individual coping Goal: STG: Patient will remain free from self harm Outcome: Progressing Pt remains safe on unit

## 2014-07-25 NOTE — Progress Notes (Signed)
Patient ID: Larry Adams, male   DOB: [birth year], 29 y.o.   MRN: [id number]  DAR: Pt. Denies SI/HI and A/V Hallucinations to this Clinical research associate. Patient does report pain and received PRN medication for this and was asleep on reassessment. Support and encouragement provided to the patient. Scheduled medications administered to patient per physician's orders. Patient is minimal with Clinical research associate and is irritable during interaction. Patient is seen mostly in his bed or going to the cafeteria for meals. Patient is not attending groups thus far today. Q15 minute checks are maintained for safety.

## 2014-07-25 NOTE — B[facility] Group Notes (Signed)
[facility] LCSW Aftercare Discharge Planning Group Note   07/25/2014   10:50 AM    Participation Quality:  Patient did not attend group.   Wynn Banker 07/25/2014 10:50 AM

## 2014-07-25 NOTE — Plan of Care (Signed)
Problem: Diagnosis: Increased Risk For Suicide Attempt Goal: STG-Patient Will Comply With Medication Regime Outcome: Progressing Patient is following medication regime at this time.

## 2014-07-25 NOTE — BHH Group Notes (Signed)
[facility] LCSW Group Therapy  Emotional Regulation 1:15 - 2:30 PM  07/25/2014 4:22 PM  Type of Therapy:  Group Therapy  Participation Level:  Did Not Attend  Wynn Banker 07/25/2014, 4:22 PM

## 2014-07-25 NOTE — Progress Notes (Signed)
D: Pt denies SI/HI/AVH. Pt is pleasant and cooperative. Pt stated he wanted to be here to help detox. Pt has a matter of fact attitude. Pt observed interacting , but pt avoids Clinical research associate.   A: Pt was offered support and encouragement. Pt was given scheduled medications. Pt was encourage to attend groups. Q 15 minute checks were done for safety.   R:Pt attends groups and interacts well with peers and staff. Pt is taking medication. Pt has no complaints at this time .Pt receptive to treatment and safety maintained on unit.

## 2014-07-25 NOTE — Progress Notes (Signed)
Adult Psychoeducational Group Note  Date:  07/25/2014 Time:  10:03 PM  Group Topic/Focus:  Narcotics Anonymous  Participation Level:  Active   Additional Comments:  Pt attended and participated in group.  Berlin Hun 07/25/2014, 10:03 PM

## 2014-07-25 NOTE — Progress Notes (Signed)
[facility] MD Progress Note  07/25/2014 5:43 PM Larry Adams  MRN:  [id number] Subjective:  Larry Adams has been mostly isolating. He states he went to a group last night but mostly in bed. When asked how he is feeling, or why he is staying in bed states " I don't know." He again denies he would hurt his aunt. He has not seen her in a long time and has seen his father sporadically when he "drops by." He states he will go back to his place and will go back to work. He was told about the duty to warn and he expressed understanding but continued to affirm he would not hurt his aunt Principal Problem: <principal problem not specified> Diagnosis:   Patient Active Problem List   Diagnosis Date Noted  . 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 Alcohol abuse [F10.10] 07/23/2014  . Substance induced mood disorder [F19.94] 07/23/2014  . 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 Back pain   . Anxiety   . Mental disorder     Past Surgical History  Procedure Laterality Date  . Hernia repair     Family History: History reviewed. No pertinent family history. Social History:  History  Alcohol Use  . Yes    Comment: pint of liquor a day; varies     History  Drug Use  . 5.00 per week  . Special: Benzodiazepines    Comment: reports that he is on klonopin and xanax; hx of marijuana use but drug screen presently negative    History   Social History  . Marital Status: Single    Spouse Name: N/A    Number of Children: N/A  . Years of Education: N/A   Social History Main Topics  . Smoking status: Current Every Day Smoker -- 1.00 packs/day    Types: Cigarettes  . Smokeless tobacco: Former Neurosurgeon  . Alcohol Use: Yes     Comment: pint of liquor a day; varies  . Drug Use: 5.00 per week    Special: Benzodiazepines     Comment: reports that he is on klonopin and xanax; hx  of marijuana use but drug screen presently negative  . Sexual Activity: Yes   Other Topics Concern  . None   Social History Narrative   Additional History:    Sleep: Fair  Appetite:  Fair   Assessment:   Musculoskeletal: Strength & Muscle Tone: within normal limits Gait & Station: normal Patient leans: N/A   Psychiatric Specialty Exam: Physical Exam  Review of Systems  Constitutional: Positive for malaise/fatigue.  HENT: Negative.   Eyes: Negative.   Respiratory: Negative.   Cardiovascular: Negative.   Gastrointestinal: Negative.   Genitourinary: Negative.   Musculoskeletal: Negative.   Skin: Negative.   Neurological: Negative.   Endo/Heme/Allergies: Negative.   Psychiatric/Behavioral: Positive for depression. The patient is nervous/anxious.     Blood pressure 124/70, pulse 67, temperature 97.5 F (36.4 C), temperature source Oral, resp. rate 18, height 5' 10.34" (1.787 m), weight 136.533 kg (301 lb).Body mass index is 42.76 kg/(m^2).  General Appearance: Disheveled  Eye Contact::  Minimal  Speech:  Clear and Coherent, Slow and not spontaneous, answers questions   Volume:  Decreased  Mood:  Anxious and worried, depressed  Affect:  Restricted  Thought Process:  Coherent and Goal Directed  Orientation:  Full (Time, Place, and Person)  Thought Content:  no  spontaneous content, answers questions  Suicidal Thoughts:  No  Homicidal Thoughts:  No  Memory:  Immediate;   Fair Recent;   Fair Remote;   Fair  Judgement:  Fair  Insight:  Present and Shallow  Psychomotor Activity:  Decreased  Concentration:  Fair  Recall:  [employer] of Knowledge:Poor  Language: Fair  Akathisia:  No  Handed:  Right  AIMS (if indicated):     Assets:  Desire for Improvement  ADL's:  Intact  Cognition: WNL  Sleep:  Number of Hours: 6.5     Current Medications: Current Facility-Administered Medications  Medication Dose Route Frequency Provider Last Rate Last Dose  .  acetaminophen (TYLENOL) tablet 650 mg  650 mg Oral Q6H PRN Bonnetta Barry, NP      . alum & mag hydroxide-simeth (MAALOX/MYLANTA) 200-200-20 MG/5ML suspension 30 mL  30 mL Oral PRN Bonnetta Barry, NP      . hydrOXYzine (ATARAX/VISTARIL) tablet 25 mg  25 mg Oral Q6H PRN Bonnetta Barry, NP   25 mg at 07/25/14 1305  . ibuprofen (ADVIL,MOTRIN) tablet 600 mg  600 mg Oral Q8H PRN Lindwood Qua, NP      . loperamide (IMODIUM) capsule 2-4 mg  2-4 mg Oral PRN Bonnetta Barry, NP      . Melene Muller ON 07/26/2014] LORazepam (ATIVAN) tablet 1 mg  1 mg Oral Daily Bonnetta Barry, NP      . magnesium hydroxide (MILK OF MAGNESIA) suspension 30 mL  30 mL Oral Daily PRN Bonnetta Barry, NP      . multivitamin with minerals tablet 1 tablet  1 tablet Oral Daily Bonnetta Barry, NP   1 tablet at 07/25/14 0810  . nicotine (NICODERM CQ - dosed in mg/24 hours) patch 21 mg  21 mg Transdermal Daily Bonnetta Barry, NP   21 mg at 07/25/14 0812  . ondansetron (ZOFRAN-ODT) disintegrating tablet 4 mg  4 mg Oral Q6H PRN Bonnetta Barry, NP   4 mg at 07/23/14 1653  . thiamine (VITAMIN B-1) tablet 100 mg  100 mg Oral Daily Bonnetta Barry, NP   100 mg at 07/25/14 0810  . zolpidem (AMBIEN) tablet 5 mg  5 mg Oral QHS PRN Bonnetta Barry, NP   5 mg at 07/24/14 2118    Lab Results: No results found for this or any previous visit (from the past 48 hour(s)).  Physical Findings: AIMS: Facial and Oral Movements Muscles of Facial Expression: None, normal Lips and Perioral Area: None, normal Jaw: None, normal Tongue: None, normal,Extremity Movements Upper (arms, wrists, hands, fingers): Minimal (withdrawals) Lower (legs, knees, ankles, toes): Minimal (withdrawals), Trunk Movements Neck, shoulders, hips: None, normal, Overall Severity Severity of abnormal movements (highest score from questions above): None, normal Incapacitation due to abnormal movements: None, normal Patient's awareness of abnormal movements (rate only patient's report):  No Awareness, Dental Status Current problems with teeth and/or dentures?: No Does patient usually wear dentures?: No  CIWA:  CIWA-Ar Total: 4 COWS:     Treatment Plan Summary: Daily contact with patient to assess and evaluate symptoms and progress in treatment and Medication management Supportive approach/coping skills/relapse prevention Benzodiazepine Dependence: continue detox protocol Alcohol Abuse: as above Reassess the co morbidities: R/O underlying depressive disorder more evident as he is detox   Medical Decision Making:  Review of Psycho-Social Stressors (1) and Review of Medication Regimen & Side Effects (2)     Burma Ketcher A 07/25/2014, 5:43 PM

## 2014-07-26 MED ORDER — HYDROXYZINE HCL 25 MG PO TABS
25.0000 mg | ORAL_TABLET | Freq: Four times a day (QID) | ORAL | Status: DC | PRN
  Administered 2014-07-26 – 2014-07-27 (×3): 25 mg via ORAL
  Filled 2014-07-26: qty 20
  Filled 2014-07-26 (×3): qty 1

## 2014-07-26 NOTE — Progress Notes (Signed)
D    Pt is somewhat irritable and guarded   He continues to complain of shoulder pain and request pain medications   His interactions are minimal but appropriate A   Verbal support given   Medications administered and effectiveness monitored    Q 15 min checks R   Pt safe at present

## 2014-07-26 NOTE — Progress Notes (Signed)
Patient ID: Larry Adams, male   DOB: 1985/12/14, 29 y.o.   MRN: [id number]  DAR: Pt. Denies SI/HI and A/V Hallucinations to this writer however patient remains irritable and it is questionable whether or not patient is actually not HI towards his aunt still. Patient does report pain in his left shoulder and received Ibuprofen for this. Patient is non-cooperative with the 0-10 pain scale. Patient refuses to rate pain on this scale no matter how many time Clinical research associate asks. Support and encouragement provided to the patient however patient is isolative and avoidant. Patient is mostly seen in his bed and is unwilling to participate in groups. Patient also did not fill out daily inventory sheet. It was found in his room with coffee covering it. Q15 minute checks are maintained for safety.

## 2014-07-26 NOTE — Progress Notes (Signed)
[facility] MD Progress Note  07/26/2014 5:10 PM Larry Adams  MRN:  [id number] Subjective:  Larry Adams is still keeping a low profile. He states he is going to more groups. He states he is not going to pursue benzodiazepines any more. States he has realized he got dependent on these medications and did not like it. He also states he is willing to drop the alcohol. 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  Principal Problem: <principal problem not specified> Diagnosis:   Patient Active Problem List   Diagnosis Date Noted  . 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 Alcohol abuse [F10.10] 07/23/2014  . Substance induced mood disorder [F19.94] 07/23/2014  . 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 Back pain   . Anxiety   . Mental disorder     Past Surgical History  Procedure Laterality Date  . Hernia repair     Family History: History reviewed. No pertinent family history. Social History:  History  Alcohol Use  . Yes    Comment: pint of liquor a day; varies     History  Drug Use  . 5.00 per week  . Special: Benzodiazepines    Comment: reports that he is on klonopin and xanax; hx of marijuana use but drug screen presently negative    History   Social History  . Marital Status: Single    Spouse Name: N/A    Number of Children: N/A  . Years of Education: N/A   Social History Main Topics  . Smoking status: Current Every Day Smoker -- 1.00 packs/day    Types: Cigarettes  . Smokeless tobacco: Former Neurosurgeon  . Alcohol Use: Yes     Comment: pint of liquor a day; varies  . Drug Use: 5.00 per week    Special: Benzodiazepines     Comment: reports that he is on klonopin and xanax; hx of marijuana use but drug screen presently negative  . Sexual Activity: Yes   Other Topics Concern  . None   Social History Narrative    Additional History:    Sleep: Fair  Appetite:  Fair   Assessment:   Musculoskeletal: Strength & Muscle Tone: within normal limits Gait & Station: normal Patient leans: N/A   Psychiatric Specialty Exam: Physical Exam  Review of Systems  Constitutional: Negative.   HENT: Negative.   Eyes: Negative.   Respiratory: Negative.   Cardiovascular: Negative.   Gastrointestinal: Negative.   Genitourinary: Negative.   Musculoskeletal: Negative.   Skin: Negative.   Neurological: Negative.   Endo/Heme/Allergies: Negative.   Psychiatric/Behavioral: Positive for substance abuse. The patient is nervous/anxious.     Blood pressure 124/80, pulse 78, temperature 97.8 F (36.6 C), temperature source Oral, resp. rate 16, height 5' 10.34" (1.787 m), weight 136.533 kg (301 lb).Body mass index is 42.76 kg/(m^2).  General Appearance: Disheveled  Eye Solicitor::  Fair  Speech:  Clear and Coherent  Volume:  Decreased  Mood:  Anxious  Affect:  Restricted  Thought Process:  Coherent, Goal Directed and no spontaneous content, answers what he is asked for  Orientation:  Full (Time, Place, and Person)  Thought Content:  symptoms events worries concerns  Suicidal Thoughts:  No  Homicidal Thoughts:  No  Memory:  Immediate;   Fair Recent;   Fair Remote;   Fair  Judgement:  Fair  Insight:  Present  Psychomotor Activity:  Restlessness  Concentration:  Fair  Recall:  [employer] of Knowledge:Fair  Language: Fair  Akathisia:  No  Handed:  Right  AIMS (if indicated):     Assets:  Desire for Improvement Housing Vocational/Educational  ADL's:  Intact  Cognition: WNL  Sleep:  Number of Hours: 6.5     Current Medications: Current Facility-Administered Medications  Medication Dose Route Frequency Provider Last Rate Last Dose  . acetaminophen (TYLENOL) tablet 650 mg  650 mg Oral Q6H PRN Bonnetta Barry, NP      . alum & mag hydroxide-simeth (MAALOX/MYLANTA) 200-200-20 MG/5ML suspension 30 mL   30 mL Oral PRN Bonnetta Barry, NP   30 mL at 07/26/14 1504  . hydrOXYzine (ATARAX/VISTARIL) tablet 25 mg  25 mg Oral Q6H PRN Shuvon Rankin, NP   25 mg at 07/26/14 1503  . ibuprofen (ADVIL,MOTRIN) tablet 600 mg  600 mg Oral Q8H PRN Lindwood Qua, NP   600 mg at 07/26/14 0759  . magnesium hydroxide (MILK OF MAGNESIA) suspension 30 mL  30 mL Oral Daily PRN Bonnetta Barry, NP      . multivitamin with minerals tablet 1 tablet  1 tablet Oral Daily Bonnetta Barry, NP   1 tablet at 07/26/14 0800  . nicotine (NICODERM CQ - dosed in mg/24 hours) patch 21 mg  21 mg Transdermal Daily Bonnetta Barry, NP   21 mg at 07/26/14 0803  . thiamine (VITAMIN B-1) tablet 100 mg  100 mg Oral Daily Bonnetta Barry, NP   100 mg at 07/26/14 0800  . zolpidem (AMBIEN) tablet 5 mg  5 mg Oral QHS PRN Bonnetta Barry, NP   5 mg at 07/25/14 2129    Lab Results: No results found for this or any previous visit (from the past 48 hour(s)).  Physical Findings: AIMS: Facial and Oral Movements Muscles of Facial Expression: None, normal Lips and Perioral Area: None, normal Jaw: None, normal Tongue: None, normal,Extremity Movements Upper (arms, wrists, hands, fingers): Minimal (withdrawals) Lower (legs, knees, ankles, toes): Minimal (withdrawals), Trunk Movements Neck, shoulders, hips: None, normal, Overall Severity Severity of abnormal movements (highest score from questions above): None, normal Incapacitation due to abnormal movements: None, normal Patient's awareness of abnormal movements (rate only patient's report): No Awareness, Dental Status Current problems with teeth and/or dentures?: No Does patient usually wear dentures?: No  CIWA:  CIWA-Ar Total: 4 COWS:     Treatment Plan Summary: Daily contact with patient to assess and evaluate symptoms and progress in treatment and Medication management  Supportive approach/coping skills Complete the detox protocol Develop a relapse prevention plan; explore ways of dealing  with the anxiety ( mindfulness, exercise, etc) in lew of going  back to using benzodiazepines or drinking to "self Medicate" Medical Decision Making:  Review of Psycho-Social Stressors (1) and Review of Medication Regimen & Side Effects (2)     Eligha Kmetz A 07/26/2014, 5:10 PM

## 2014-07-26 NOTE — B[facility] Group Notes (Signed)
[facility] 0900 Nursing Group  The focus of this group is to educate the patient on the purpose and policies of crisis stabilization and provide a format to answer questions about their admission.  The group details unit policies and expectations of patients while admitted.  Patient was invited to group but chose to stay in bed instead.

## 2014-07-26 NOTE — Plan of Care (Signed)
Problem: Diagnosis: Increased Risk For Suicide Attempt Goal: STG-Patient Will Attend All Groups On The Unit Outcome: Not Progressing Patient is unwilling to participate at this time.

## 2014-07-26 NOTE — B[facility] Group Notes (Signed)
[facility] LCSW Group Therapy  [facility] [place] 1:15 - 2:30 PM  07/26/2014 3:05 PM   Type of Therapy:  Group Therapy  Participation Level: Minimal  Participation Quality:  Attentive  Affect:  Appropriate  Cognitive:  Appropriate  Insight:  Developing/Improving   Engagement in Therapy:  Developing/Improving   Modes of Intervention:  Discussion, Education, Exploration, Problem-Solving, Rapport Building, Support   Summary of Progress/Problems:   Patient was attentive to speaker from the [employer] as he shared his story of dealing with mental health/substance abuse issues and overcoming it by working a recovery program.  Patient made no comments on the presentation but nodded in agreement and was seen wiping his eyes on some the presenter comments.   Wynn Banker 07/26/2014 3:05 PM

## 2014-07-26 NOTE — Progress Notes (Signed)
[facility] Group Notes:  (Nursing/MHT/Case Management/Adjunct)  Date:  07/26/2014  Time:  2030 Type of Therapy:  wrap up group  Participation Level:  Active  Participation Quality:  Appropriate, Attentive, Sharing and Supportive  Affect:  Appropriate  Cognitive:  Appropriate  Insight:  Improving  Engagement in Group:  Engaged  Modes of Intervention:  Clarification, Education and Support  Summary of Progress/Problems: Pt reported having been in a halfway house but wants to go to [facility] for residential treatment.  Pt shared he was happy and ready to be able to be discharged but shared little to no plan other than get on a plane and go.  Shelah Lewandowsky 07/26/2014, 9:46 PM

## 2014-07-26 NOTE — Clinical Social Work Note (Addendum)
CSW spoke with patient's aunt, Pura Spice [phone] Room 105 at the [facility] in [place].  Aunt was advised that at time of admission, patient was expressing thoughts to harm her but has not made any further threats since admission.  Ms. Yetta Barre stated patient has been very hostile towards her lately because his father is living with her and he can no longer do the things he once did not him.  Ms. Yetta Barre had questions about patient's care but was advised we are unable to provide any information other than the duty to warn.  Patient advised warning has been made and CSW advised patient against going around his aunt at this time.

## 2014-07-27 MED ORDER — ADULT MULTIVITAMIN W/MINERALS CH: 1.0000 | ORAL_TABLET | Freq: Every day | ORAL | Status: DC

## 2014-07-27 MED ORDER — HYDROXYZINE HCL 25 MG PO TABS: 25.0000 mg | ORAL_TABLET | Freq: Four times a day (QID) | ORAL | Status: DC | PRN

## 2014-07-27 MED ORDER — ZOLPIDEM TARTRATE 5 MG PO TABS: 5.0000 mg | ORAL_TABLET | Freq: Every evening | ORAL | Status: DC | PRN

## 2014-07-27 MED ORDER — IBUPROFEN 800 MG PO TABS: 800.0000 mg | ORAL_TABLET | Freq: Three times a day (TID) | ORAL | Status: DC | PRN

## 2014-07-27 NOTE — Tx Team (Signed)
Interdisciplinary Treatment Plan Update   Date Reviewed:  07/27/2014  Time Reviewed:  8:43 AM  Progress in Treatment:   Attending groups: Patient is not attending groups. Participating in groups: No Taking medication as prescribed: Yes  Tolerating medication: Yes Family/Significant other contact made:  No, patient declined collateral contact Patient understands diagnosis: Yes, patient understands diagnosis and need for treatment. Discussing patient identified problems/goals with staff: Yes, patient is able to express goals for treatment and discharge. Medical problems stabilized or resolved: Yes Denies suicidal/homicidal ideation: Yes Patient has not harmed self or others: Yes  For review of initial/current patient goals, please see plan of care.  Estimated Length of Stay:     Discharge today  Reasons for Continued Hospitalization:   New Problems/Goals identified:    Discharge Plan or Barriers:   Home with outpatient follow up with [facility]  Additional Comments:  Duty to warn completed with aunt, Pura Spice, on 07/26/14.   Patient and CSW reviewed patient's identified goals and treatment plan.  Patient verbalized understanding and agreed to treatment plan.   Attendees:  Patient:  07/27/2014 8:43 AM   Signature:  Sallyanne Havers, MD 07/27/2014 8:43 AM  Signature: Geoffery Lyons, MD 07/27/2014 8:43 AM  Signature:  Robbie Louis, RN 07/27/2014 8:43 AM  Signature: Waynetta Sandy, RN 07/27/2014 8:43 AM  Signature:  Rodman Key, RN 07/27/2014 8:43 AM  Signature:  Juline Patch, LCSW 07/27/2014 8:43 AM  Signature:  Belenda Cruise Drinkard, LCSW-A 07/27/2014 8:43 AM  Signature:  Leisa Lenz, Care Coordinator [facility] 07/27/2014 8:43 AM  Signature:   07/27/2014 8:43 AM  Signature:  07/27/2014  8:43 AM  Signature:   Onnie Boer, RN [facility] 07/27/2014  8:43 AM  Signature: 07/27/2014  8:43 AM    Scribe for Treatment Team:   Juline Patch,  07/27/2014 8:43 AM

## 2014-07-27 NOTE — Progress Notes (Signed)
D:Pt has been in bed most of the morning. Pt reports anxiety about getting into a halfway following discharge. Pt rates his anxiety as a 10 on 1-10 scale with 10 being the most. He rates depression and hopelessness as a 5. When asked about HI thoughts towards his aunt, pt responded that he is just working on himself. He denies si and hi thoughts at this time.  A:Offered support, encouragement and 15 minute checks. Discussed gun access with treatment team. Reported that pt signed 72 hour request for discharge to MD. R:Safety maintained on the unit.

## 2014-07-27 NOTE — [facility] Suicide Risk Assessment (Signed)
[facility] Discharge Suicide Risk Assessment   Demographic Factors:  Male and Caucasian  Total Time spent with patient: 30 minutes  Musculoskeletal: Strength & Muscle Tone: within normal limits Gait & Station: normal Patient leans: N/A  Psychiatric Specialty Exam: Physical Exam  Review of Systems  Constitutional: Negative.   HENT: Negative.   Eyes: Negative.   Respiratory: Negative.   Cardiovascular: Negative.   Gastrointestinal: Negative.   Genitourinary: Negative.   Musculoskeletal: Negative.   Skin: Negative.   Neurological: Negative.   Endo/Heme/Allergies: Negative.   Psychiatric/Behavioral: Positive for substance abuse. The patient is nervous/anxious.     Blood pressure 127/71, pulse 88, temperature 98.2 F (36.8 C), temperature source Oral, resp. rate 24, height 5' 10.34" (1.787 m), weight 136.533 kg (301 lb).Body mass index is 42.76 kg/(m^2).  General Appearance: Fairly Groomed  Patent attorney::  Fair  Speech:  Clear and Coherent, Slow and not spontaneous409  Volume:  Decreased  Mood:  Anxious  Affect:  Restricted  Thought Process:  Coherent and Goal Directed  Orientation:  Full (Time, Place, and Person)  Thought Content:  plans as he moves on, relapse prevention plan  Suicidal Thoughts:  No  Homicidal Thoughts:  No  Memory:  Immediate;   Fair Recent;   Fair Remote;   Fair  Judgement:  Fair  Insight:  Present  Psychomotor Activity:  Normal  Concentration:  Fair  Recall:  [employer] of Knowledge:Fair  Language: Fair  Akathisia:  No  Handed:  Right  AIMS (if indicated):     Assets:  Desire for Improvement Housing Vocational/Educational  Sleep:  Number of Hours: 4  Cognition: WNL  ADL's:  Intact   Have you used any form of tobacco in the last 30 days? (Cigarettes, Smokeless Tobacco, Cigars, and/or Pipes): Yes  Has this patient used any form of tobacco in the last 30 days? (Cigarettes, Smokeless Tobacco, Cigars, and/or Pipes) Yes, A prescription for an  FDA-approved tobacco cessation medication was offered at discharge and the patient refused  Mental Status Per Nursing Assessment::   On Admission:  Thoughts of violence towards others  Current Mental Status by Physician: In full contact with reality. There are no active S/S of withdrawal. No SI, no HI, plans or intent. He states he will not hurt his aunt or anyone else. Admits that he was upset when he said what he said. Did not mean it. He is planning to abstain from using benzodiazepines as well as drinking. States he has realized how hard it can be when you get dependent on them. Plans to be back at work monday   Loss Factors: NA  Historical Factors: NA  Risk Reduction Factors:   Employed and Positive social support  Continued Clinical Symptoms:  Alcohol/Substance Abuse/Dependencies  Cognitive Features That Contribute To Risk:  Closed-mindedness, Polarized thinking and Thought constriction (tunnel vision)    Suicide Risk:  Minimal: No identifiable suicidal ideation.  Patients presenting with no risk factors but with morbid ruminations; may be classified as minimal risk based on the severity of the depressive symptoms  Principal Problem: <principal problem not specified> Discharge Diagnoses:  Patient Active Problem List   Diagnosis Date Noted  . 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 Alcohol abuse [F10.10] 07/23/2014  . Substance induced mood disorder [F19.94] 07/23/2014  . 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   Why:  Please go to [facility]'s walk in clinic on Monday, July 30, 2013 or any weekday between 8AM - 3PM for medication managment counseling   Contact information:   [address] [place], [place]   [address]  [phone]      Plan Of Care/Follow-up recommendations:  Activity:  as tolerated Diet:  regular Follow up: [facility] as above. Continue to work on the life  style changes that will help better manage your mood and your anxiety: like exercise Is patient on multiple antipsychotic therapies at discharge:  No   Has Patient had three or more failed trials of antipsychotic monotherapy by history:  No  Recommended Plan for Multiple Antipsychotic Therapies: NA    Jyl Chico A 07/27/2014, 12:59 PM

## 2014-07-27 NOTE — Discharge Summary (Signed)
Physician Discharge Summary Note  Patient:  Larry Adams is an 29 y.o., male MRN:  [id number] DOB:  04/21/1986 Patient phone:  [phone] (home)  Patient address:   [address] [place] [address],  Total Time spent with patient: Greater than 30 minutes  Date of Admission:  07/23/2014  Date of Discharge: 07/27/13  Reason for Admission:  Drug detoxification treatment  Principal Problem: <principal problem not specified> Discharge Diagnoses: Patient Active Problem List   Diagnosis Date Noted  . 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 Alcohol abuse [F10.10] 07/23/2014  . Substance induced mood disorder [F19.94] 07/23/2014  . 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 He appears well-developed.  HENT:  Head: Normocephalic.  Eyes: Pupils are equal, round, and reactive to light.  Neck: Normal range of motion.  Cardiovascular: Normal rate.   Respiratory: Effort normal.  GI: Soft. Bowel sounds are normal.  Genitourinary:  Denies any issues  Musculoskeletal: Normal range of motion.  Neurological: He is alert and oriented to person, place, and time.  Skin: Skin is warm and dry.  Psychiatric: His mood appears not anxious. His affect is not angry, not blunt, not labile and not inappropriate. He does not exhibit a depressed mood.    Review of Systems  Constitutional: Negative.   HENT: Negative.   Eyes: Negative.   Respiratory: Negative.   Cardiovascular: Negative.   Gastrointestinal: Negative.   Genitourinary: Negative.   Musculoskeletal: Negative.   Skin: Negative.   Neurological: Negative.   Endo/Heme/Allergies: Negative.   Psychiatric/Behavioral: Positive for depression (stable) and substance abuse (Benzodiazepine dependence,  Alcohol abuse). Negative for suicidal ideas, hallucinations and memory loss. The patient has insomnia (Stable). The patient is not nervous/anxious.     Blood pressure 117/75, pulse 100, temperature 98.2 F (36.8 C), temperature source Oral, resp. rate 24, height 5' 10.34" (1.787 m), weight 136.533 kg (301 lb).Body mass index is 42.76 kg/(m^2).  See Md's SRA   Past Medical History:  Past Medical History  Diagnosis Date  . Back pain   . Anxiety   . Mental disorder     Past Surgical History  Procedure Laterality Date  . Hernia repair     Family History: History reviewed. No pertinent family history. Social History:  History  Alcohol Use  . Yes    Comment: pint of liquor a day; varies     History  Drug Use  . 5.00 per week  . Special: Benzodiazepines    Comment: reports that he is on klonopin and xanax; hx of marijuana use but drug screen presently negative    History   Social History  . Marital Status: Single    Spouse Name: N/A    Number of Children: N/A  . Years of Education: N/A   Social History Main Topics  . Smoking status: Current Every Day Smoker -- 1.00 packs/day    Types: Cigarettes  . Smokeless tobacco: Former Neurosurgeon  . Alcohol Use: Yes     Comment: pint of liquor a day; varies  . Drug Use: 5.00 per week    Special: Benzodiazepines     Comment: reports that he is on klonopin and xanax; hx of marijuana use but drug screen presently negative  . Sexual Activity: Yes   Other Topics Concern  . None   Social History Narrative  Risk to Self: Is patient at risk for suicide?: No What has been your use of drugs/alcohol within the last 12 months?: Patient declined to answer questions on amount of alcohol intake other than to say large amounts  Risk to Others: NO  Prior Inpatient Therapy: Yes  Prior Outpatient Therapy: Yes Level of Care:  OP  Hospital Course:  Larry Adams is a 29 year old Caucasian male. Admitted from the [facility]. He reports, "I was  in a bad mood yesterday because me & my aunt got into it. We had it out with each other. I got very sad and had a bad thought that Larry Adams I get my hands on her, I will hurt her. But, I went to the ED instead. I was also drinking alcohol at the time. My drinking worsened since the death of my cousin in 11-28-10 of drug overdose. I drink liquor & beer only when I ran out of my anxiety medicine. I take Klonopin for my anxiety, been on it x 7 years. Doctor Benjamin prescribes it for me, he is in [place]. It is hard to get my refills timely because he is so far away. Am I an alcoholic? yes, but, I'm not depressed. I have problem sleeping, does not want Trazodone. Gives me nightmares. I'm not suicidal of homicidal".  Larry Adams was admitted to the hospital with his UDS reports showing positive Benzodiazepine & Toxicology test result showing a BAl of 233. He reports having been drinking alcohol because he ran out of his antianxiety medicine. Larry Adams's recent lab reports indicated elevated liver enzyme (AST). He is not a candidate for Librium detox protocols as a result.  His detoxification treatment was achieved using Ativan detox regimen on a tapering dose format. This was used in place of Librium detox protocol because Librium is a long acting benzodiazepine with a long half life. Larry Adams was used for this detox treatment,  will impose on a already compromised liver functions. This way, Larry Adams received a cleaner detoxification treatment without endangering his liver functions any further. He was also enrolled in the group counseling sessions, being offered and held on this unit. He participated and learned coping skills. He presented no other pre-existing medical issues that required treatments.  He tolerated his treatment regimen without any significant adverse effects and or reactions.  Besides the detox treatments, Larry Adams also was medicated & discharged on Ambien 10 mg at bedtime for sleep & Hydroxyzine 25 mg qid prn for  anxiety. He has completed detox treatment and his mood is stable. This is evidenced by his reports of improved mood and absence of substance withdrawal symptoms. He will resume psychiatric care and routine medication management at the [facility] clinic here in [place], [place]. He is provided with all the necessary information required to make this appointment without problems.  Upon discharge, he adamantly denies any suicidal, homicidal ideations, auditory, visual hallucinations, delusional thoughts, paranoia and or substance withdrawal symptoms. He left [facility] with all personal belongings in no apparent distress. He received a 14 days worth supply samples of his [facility] discharge medications provided by [facility] pharmacy. Transportation per city bus. Bus pass provided by [facility].  Consults:  psychiatry  Significant Diagnostic Studies:  labs: CBC with diff, CMP, UDS, toxicology tests, U/A, results reviewed, no changes  Discharge Vitals:   Blood pressure 117/75, pulse 100, temperature 98.2 F (36.8 C), temperature source Oral, resp. rate 24, height 5' 10.34" (1.787 m), weight 136.533 kg (301 lb). Body mass index is 42.76 kg/(m^2).  Lab Results:   No results found for this or any previous visit (from the past 72 hour(s)).  Physical Findings: AIMS: Facial and Oral Movements Muscles of Facial Expression: None, normal Lips and Perioral Area: None, normal Jaw: None, normal Tongue: None, normal,Extremity Movements Upper (arms, wrists, hands, fingers): Minimal (withdrawals) Lower (legs, knees, ankles, toes): Minimal (withdrawals), Trunk Movements Neck, shoulders, hips: None, normal, Overall Severity Severity of abnormal movements (highest score from questions above): None, normal Incapacitation due to abnormal movements: None, normal Patient's awareness of abnormal movements (rate only patient's report): No Awareness, Dental Status Current problems with teeth and/or dentures?: No Does patient usually wear dentures?:  No  CIWA:  CIWA-Ar Total: 3 COWS:      See Psychiatric Specialty Exam and Suicide Risk Assessment completed by Attending Physician prior to discharge.  Discharge destination:  Home  Is patient on multiple antipsychotic therapies at discharge:  No   Has Patient had three or more failed trials of antipsychotic monotherapy by history:  No  Recommended Plan for Multiple Antipsychotic Therapies: NA    Medication List    STOP taking these medications        ALPRAZolam 1 MG tablet  Commonly known as:  XANAX     clonazePAM 2 MG tablet  Commonly known as:  KLONOPIN     cyclobenzaprine 10 MG tablet  Commonly known as:  FLEXERIL     mirtazapine 7.5 MG tablet  Commonly known as:  REMERON     MUCINEX ALLERGY PO      TAKE these medications      Indication   hydrOXYzine 25 MG tablet  Commonly known as:  ATARAX/VISTARIL  Take 1 tablet (25 mg total) by mouth every 6 (six) hours as needed for anxiety (agitation).   Indication:  Anxiety     ibuprofen 800 MG tablet  Commonly known as:  ADVIL,MOTRIN  Take 1 tablet (800 mg total) by mouth every 8 (eight) hours as needed for mild pain.   Indication:  Pain     multivitamin with minerals Tabs tablet  Take 1 tablet by mouth daily. For nutrition supplementation   Indication:  Nutritional supplement     zolpidem 5 MG tablet  Commonly known as:  AMBIEN  Take 1 tablet (5 mg total) by mouth at bedtime as needed for sleep.   Indication:  Trouble Sleeping       Follow-up Information    Follow up with [facility] On 07/30/2014.   Why:  Please go to [facility]'s walk in clinic on Monday, July 30, 2013 or any weekday between 8AM - 3PM for medication managment counseling   Contact information:   [address] [place], [place]   [address]  [phone]     Follow-up recommendations: Activity:  As tolerated Diet: As recommended by your primary care doctor. Keep all scheduled follow-up appointments as recommended.   Comments: Take all your  medications as prescribed by your mental healthcare provider. Report any adverse effects and or reactions from your medicines to your outpatient provider promptly. Patient is instructed and cautioned to not engage in alcohol and or illegal drug use while on prescription medicines. In the event of worsening symptoms, patient is instructed to call the crisis hotline, 911 and or go to the nearest ED for appropriate evaluation and treatment of symptoms. Follow-up with your primary care provider for your other medical issues, concerns and or health care needs.  Total Discharge Time: Greater than 35 minutes  Signed: Sanjuana Kava, PMHNP-BC  07/27/2014, 11:46 AM  I personally assessed the patient and formulated the plan Madie Reno A. Dub Mikes, M.D.

## 2014-07-27 NOTE — Progress Notes (Signed)
Pt d/c with a bus pass. All items returned. D/C instructions given, prescription given and samples given. Pt denies si and hi.

## 2014-07-27 NOTE — Plan of Care (Signed)
Problem: Ineffective individual coping Goal: STG: Patient will remain free from self harm Outcome: Progressing Pt denies si and hi at present time.  Problem: Diagnosis: Increased Risk For Suicide Attempt Goal: LTG-Patient Will Show Positive Response to Medication LTG (by discharge) : Patient will show positive response to medication and will participate in the development of the discharge plan.  Outcome: Not Progressing Pt taking medications as prescribed.  Goal: STG-Patient Will Attend All Groups On The Unit Outcome: Not Progressing Pt has minimal interaction staying in bed this morning. Pt has been encouraged to attend groups.

## 2014-07-27 NOTE — Plan of Care (Signed)
Problem: Ineffective individual coping Goal: STG: Patient will remain free from self harm Outcome: Progressing Pt is free from self harm

## 2014-07-27 NOTE — Progress Notes (Signed)
  [facility] Adult Case Management Discharge Plan :  Will you be returning to the same living situation after discharge:  Yes,  Patient will return to his home. At discharge, do you have transportation home?: Yes,  Patient assisted with bus pass. Do you have the ability to pay for your medications: No.  Patient needs assistance with indigent medications   Release of information consent forms completed and in the chart;  Patient's signature needed at discharge.  Patient to Follow up at: Follow-up Information    Follow up with [facility] On 07/30/2014.   Why:  Please go to [facility]'s walk in clinic on Monday, July 30, 2013 or any weekday between 8AM - 3PM for medication managment counseling   Contact information:   [address] [place], [place]   [address]  [phone]      Patient denies SI/H:   Patient no longer endorsing SI/HI or other thoughts of self harm.   Safety Planning and Suicide Prevention discussed: .Reviewed with all patients during discharge planning group   Has patient been referred to the Quitline?:  Patient declined referral to Quitline.  Wynn Banker 07/27/2014, 11:07 AM

## 2014-07-27 NOTE — Clinical Social Work Note (Signed)
CSW asked patient about report of him having access to guns.  Patient stated his guns are gone.  He advised of being ready to discharge today and was given two bus passes.

## 2014-08-01 NOTE — Progress Notes (Signed)
Patient Discharge Instructions:  After Visit Summary (AVS):   Faxed to:  08/01/14 Discharge Summary Note:   Faxed to:  08/01/14 Psychiatric Admission Assessment Note:   Faxed to:  08/01/14 Suicide Risk Assessment - Discharge Assessment:   Faxed to:  08/01/14 Faxed/Sent to the Next Level Care provider:  08/01/14 Faxed to [facility] @ [phone]  Jerelene Redden, 08/01/2014, 3:27 PM

## 2017-09-11 ENCOUNTER — Other Ambulatory Visit: Payer: Self-pay

## 2017-09-11 ENCOUNTER — Emergency Department (HOSPITAL_COMMUNITY)
Admission: EM | Admit: 2017-09-11 | Discharge: 2017-09-12 | Disposition: A | Discharge status: Other Acute Inpatient Hospital | Payer: Medicaid - Out of State | Attending: Emergency Medicine | Admitting: Emergency Medicine

## 2017-09-11 ENCOUNTER — Encounter (HOSPITAL_COMMUNITY): Payer: Self-pay | Admitting: Emergency Medicine

## 2017-09-11 DIAGNOSIS — Y908 Blood alcohol level of 240 mg/100 ml or more: Secondary | ICD-10-CM | POA: Insufficient documentation

## 2017-09-11 DIAGNOSIS — F1014 Alcohol abuse with alcohol-induced mood disorder: Secondary | ICD-10-CM

## 2017-09-11 DIAGNOSIS — R45851 Suicidal ideations: Secondary | ICD-10-CM

## 2017-09-11 DIAGNOSIS — F411 Generalized anxiety disorder: Secondary | ICD-10-CM | POA: Insufficient documentation

## 2017-09-11 DIAGNOSIS — F339 Major depressive disorder, recurrent, unspecified: Secondary | ICD-10-CM

## 2017-09-11 DIAGNOSIS — F1910 Other psychoactive substance abuse, uncomplicated: Secondary | ICD-10-CM

## 2017-09-11 DIAGNOSIS — F322 Major depressive disorder, single episode, severe without psychotic features: Secondary | ICD-10-CM

## 2017-09-11 DIAGNOSIS — F10920 Alcohol use, unspecified with intoxication, uncomplicated: Secondary | ICD-10-CM | POA: Insufficient documentation

## 2017-09-11 DIAGNOSIS — F17210 Nicotine dependence, cigarettes, uncomplicated: Secondary | ICD-10-CM | POA: Insufficient documentation

## 2017-09-11 DIAGNOSIS — F1010 Alcohol abuse, uncomplicated: Secondary | ICD-10-CM

## 2017-09-11 LAB — COMPREHENSIVE METABOLIC PANEL
ALBUMIN: 3.6 g/dL (ref 3.5–5.0)
ALT: 39 U/L (ref 17–63)
AST: 59 U/L — AB (ref 15–41)
Alkaline Phosphatase: 80 U/L (ref 38–126)
Anion gap: 10 (ref 5–15)
BUN: <5 mg/dL — ABNORMAL LOW (ref 6–20)
CO2: 22 mmol/L (ref 22–32)
CREATININE: 0.78 mg/dL (ref 0.61–1.24)
Calcium: 8.9 mg/dL (ref 8.9–10.3)
Chloride: 115 mmol/L — ABNORMAL HIGH (ref 101–111)
GFR calc Af Amer: >60 mL/min (ref >60)
GFR calc non Af Amer: >60 mL/min (ref >60)
GLUCOSE: 103 mg/dL — AB (ref 65–99)
Potassium: 3.5 mmol/L (ref 3.5–5.1)
Sodium: 147 mmol/L — ABNORMAL HIGH (ref 135–145)
Total Bilirubin: 1.4 mg/dL — ABNORMAL HIGH (ref 0.3–1.2)
Total Protein: 7.1 g/dL (ref 6.5–8.1)

## 2017-09-11 LAB — CBC
HCT: 48.5 % (ref 39.0–52.0)
HEMOGLOBIN: 16.8 g/dL (ref 13.0–17.0)
MCH: 32.4 pg (ref 26.0–34.0)
MCHC: 34.6 g/dL (ref 30.0–36.0)
MCV: 93.6 fL (ref 78.0–100.0)
Platelets: 246 10*3/uL (ref 150–400)
RBC: 5.18 MIL/uL (ref 4.22–5.81)
RDW: 13.5 % (ref 11.5–15.5)
WBC: 8.4 10*3/uL (ref 4.0–10.5)

## 2017-09-11 LAB — ETHANOL: Alcohol, Ethyl (B): 218 mg/dL — ABNORMAL HIGH (ref <10)

## 2017-09-11 LAB — ACETAMINOPHEN LEVEL: Acetaminophen (Tylenol), Serum: <10 ug/mL — ABNORMAL LOW (ref 10–30)

## 2017-09-11 LAB — SALICYLATE LEVEL: Salicylate Lvl: <7 mg/dL (ref 2.8–30.0)

## 2017-09-11 MED ORDER — IBUPROFEN 200 MG PO TABS
600.0000 mg | ORAL_TABLET | Freq: Three times a day (TID) | ORAL | Status: DC | PRN
  Administered 2017-09-12: 600 mg via ORAL
  Filled 2017-09-11: qty 3

## 2017-09-11 MED ORDER — VITAMIN B-1 100 MG PO TABS
100.0000 mg | ORAL_TABLET | Freq: Every day | ORAL | Status: DC
  Administered 2017-09-12: 100 mg via ORAL
  Filled 2017-09-11: qty 1

## 2017-09-11 MED ORDER — LORAZEPAM 1 MG PO TABS: 0.0000 mg | ORAL_TABLET | Freq: Two times a day (BID) | ORAL | Status: DC

## 2017-09-11 MED ORDER — ONDANSETRON HCL 4 MG PO TABS
4.0000 mg | ORAL_TABLET | Freq: Three times a day (TID) | ORAL | Status: DC | PRN
Start: 2017-09-11 — End: 2017-09-12

## 2017-09-11 MED ORDER — LORAZEPAM 1 MG PO TABS
0.0000 mg | ORAL_TABLET | Freq: Four times a day (QID) | ORAL | Status: DC
  Administered 2017-09-12 (×2): 2 mg via ORAL
  Filled 2017-09-11 (×2): qty 2

## 2017-09-11 MED ORDER — LORAZEPAM 2 MG/ML IJ SOLN: 0.0000 mg | Freq: Four times a day (QID) | INTRAMUSCULAR | Status: DC

## 2017-09-11 MED ORDER — THIAMINE HCL 100 MG/ML IJ SOLN: 100.0000 mg | Freq: Every day | INTRAMUSCULAR | Status: DC

## 2017-09-11 MED ORDER — ALUM & MAG HYDROXIDE-SIMETH 200-200-20 MG/5ML PO SUSP: 30.0000 mL | Freq: Four times a day (QID) | ORAL | Status: DC | PRN

## 2017-09-11 MED ORDER — LORAZEPAM 2 MG/ML IJ SOLN: 0.0000 mg | Freq: Two times a day (BID) | INTRAMUSCULAR | Status: DC

## 2017-09-11 MED ORDER — NICOTINE 21 MG/24HR TD PT24
21.0000 mg | MEDICATED_PATCH | Freq: Every day | TRANSDERMAL | Status: DC
  Administered 2017-09-12: 21 mg via TRANSDERMAL
  Filled 2017-09-11: qty 1

## 2017-09-11 NOTE — ED Notes (Signed)
Pt given milk 

## 2017-09-11 NOTE — ED Notes (Signed)
Pt c/o SI without a plan. He has problems with alcohol and heroin because he can't see his son.

## 2017-09-11 NOTE — ED Notes (Signed)
ED Provider at bedside. 

## 2017-09-11 NOTE — ED Triage Notes (Signed)
Pt BIB [facility] voluntary for depression and suicidal ideation. Patient states that his son will be 1 in April and he ahs not seen him in six months and is only allowed 1 hour supervised visits. Patient states he misses his son and wants to get himself together to see his son. Patient states he was going to attempt to kill himself this weekend but it did not work. Patient has been using heroin and alcohol to cope.

## 2017-09-11 NOTE — ED Provider Notes (Signed)
[facility]-EMERGENCY DEPT Provider Note   CSN: [id number] Arrival date & time: 09/11/17  2051     History   Chief Complaint Chief Complaint  Patient presents with  . Suicidal    HPI Larry Adams is a 32 y.o. male.  HPI Larry Adams is a 32 y.o. male with history of anxiety, polysubstance abuse, alcohol abuse, presents to emergency department with complaint of worsening depression and suicidal thoughts.  Also requesting help with his substance abuse.  Patient states he has been most recently more upset because his son is about to turn 1 he is unable to see him.  Patient's son lives in a different state with his mother.  Patient states he turned to drugs and alcohol to deal with him missing his son.  He states he drinks in sheets of heroin daily.  He states he last used 6 hours ago.  He denies any suicidal plans or homicidal ideations.  He states he has no more will of living if he is unable to get his life together and see his son.  He denies any other complaints.  Past Medical History:  Diagnosis Date  . Anxiety   . Back pain   . Mental disorder     Patient Active Problem List   Diagnosis Date Noted  . Benzodiazepine dependence (HCC) 07/23/2014  . Alcohol abuse 07/23/2014  . Substance induced mood disorder (HCC) 07/23/2014  . 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 HERNIA REPAIR         Home Medications    Prior to Admission medications   Medication Sig Start Date End Date Taking? Authorizing Provider  hydrOXYzine (ATARAX/VISTARIL) 25 MG tablet Take 1 tablet (25 mg total) by mouth every 6 (six) hours as needed for anxiety (agitation). Patient not taking: Reported on 09/11/2017 07/27/14   Armandina Stammer I, NP  Multiple Vitamin (MULTIVITAMIN WITH MINERALS) TABS tablet Take 1 tablet by mouth daily. For nutrition supplementation Patient not taking: Reported on 09/11/2017 07/27/14    Armandina Stammer I, NP  zolpidem (AMBIEN) 5 MG tablet Take 1 tablet (5 mg total) by mouth at bedtime as needed for sleep. Patient not taking: Reported on 09/11/2017 07/27/14   Sanjuana Kava, NP    Family History History reviewed. No pertinent family history.  Social History Social History   Tobacco Use  . Smoking status: Current Every Day Smoker    Packs/day: 1.00    Types: Cigarettes  . Smokeless tobacco: Former Engineer, water Use Topics  . Alcohol use: Yes    Comment: pint of liquor a day; varies  . Drug use: Yes    Frequency: 5.0 times per week    Types: Benzodiazepines, IV    Comment: reports that he is on klonopin and xanax; hx of marijuana use but drug screen presently negative     Allergies   Morphine and related   Review of Systems Review of Systems  Constitutional: Negative for chills and fever.  Respiratory: Negative for cough, chest tightness and shortness of breath.   Cardiovascular: Negative for chest pain, palpitations and leg swelling.  Gastrointestinal: Negative for abdominal distention, abdominal pain, diarrhea, nausea and vomiting.  Genitourinary: Negative for dysuria, frequency, hematuria and urgency.  Musculoskeletal: Negative for arthralgias, myalgias, neck pain and neck stiffness.  Skin: Negative for rash.  Allergic/Immunologic: Negative for immunocompromised state.  Neurological: Negative for dizziness, weakness, light-headedness, numbness and headaches.  Psychiatric/Behavioral: Positive for  behavioral problems and suicidal ideas. The patient is nervous/anxious.   All other systems reviewed and are negative.    Physical Exam Updated Vital Signs BP (!) 125/103 (BP Location: Left Arm)   Pulse 100   Temp 98.3 F (36.8 C) (Oral)   Resp 18   Ht 6' (1.829 m)   Wt 127 kg (280 lb)   SpO2 100%   BMI 37.97 kg/m   Physical Exam  Constitutional: He appears well-developed and well-nourished.  Intoxicated  HENT:  Head: Normocephalic and atraumatic.    Eyes: Conjunctivae are normal.  Neck: Neck supple.  Cardiovascular: Normal rate, regular rhythm and normal heart sounds.  Pulmonary/Chest: Effort normal. No respiratory distress. He has no wheezes. He has no rales.  Abdominal: Soft. Bowel sounds are normal. He exhibits no distension. There is no tenderness. There is no rebound.  Musculoskeletal: He exhibits no edema.  Neurological: He is alert.  Skin: Skin is warm and dry.  Psychiatric:  Tearful, flat affect  Nursing note and vitals reviewed.    ED Treatments / Results  Labs (all labs ordered are listed, but only abnormal results are displayed) Labs Reviewed  COMPREHENSIVE METABOLIC PANEL - Abnormal; Notable for the following components:      Result Value   Sodium 147 (*)    Chloride 115 (*)    Glucose, Bld 103 (*)    BUN <5 (*)    AST 59 (*)    Total Bilirubin 1.4 (*)    All other components within normal limits  ETHANOL - Abnormal; Notable for the following components:   Alcohol, Ethyl (B) 218 (*)    All other components within normal limits  ACETAMINOPHEN LEVEL - Abnormal; Notable for the following components:   Acetaminophen (Tylenol), Serum <10 (*)    All other components within normal limits  SALICYLATE LEVEL  CBC  RAPID URINE DRUG SCREEN, HOSP PERFORMED    EKG  EKG Interpretation None       Radiology No results found.  Procedures Procedures (including critical care time)  Medications Ordered in ED Medications  LORazepam (ATIVAN) injection 0-4 mg (not administered)    Or  LORazepam (ATIVAN) tablet 0-4 mg (not administered)  LORazepam (ATIVAN) injection 0-4 mg (not administered)    Or  LORazepam (ATIVAN) tablet 0-4 mg (not administered)  thiamine (VITAMIN B-1) tablet 100 mg (not administered)    Or  thiamine (B-1) injection 100 mg (not administered)  ondansetron (ZOFRAN) tablet 4 mg (not administered)  alum & mag hydroxide-simeth (MAALOX/MYLANTA) 200-200-20 MG/5ML suspension 30 mL (not  administered)  nicotine (NICODERM CQ - dosed in mg/24 hours) patch 21 mg (not administered)  ibuprofen (ADVIL,MOTRIN) tablet 600 mg (not administered)     Initial Impression / Assessment and Plan / ED Course  I have reviewed the triage vital signs and the nursing notes.  Pertinent labs & imaging results that were available during my care of the patient were reviewed by me and considered in my medical decision making (see chart for details).      Patient in the emergency department with depression, suicidal thoughts, polysubstance abuse.  He is intoxicated but is able to communicate and provide history.  Alcohol level is 218.  Labs unremarkable other than slightly elevated sodium at 147, slightly bumped AST and bili.  Denies any medical problems at this time and is medically cleared.  Although his drug screen is still pending.  We will get TTS assessment. CIWA protocol started.     Final Clinical Impressions(s) /  ED Diagnoses   Final diagnoses:  Suicidal ideation  Alcohol abuse  Polysubstance abuse (HCC)  Episode of recurrent major depressive disorder, unspecified depression episode severity [facility])    ED Discharge Orders    None       Jaynie Crumble, PA-C 09/12/17 0000    Charlynne Pander, MD 09/12/17 1600

## 2017-09-11 NOTE — ED Notes (Signed)
Bed: WTR6 Expected date:  Expected time:  Means of arrival:  Comments: 

## 2017-09-11 NOTE — ED Notes (Signed)
Pt has two white pt belongings' bags placed in triage under the water fountain.

## 2017-09-12 ENCOUNTER — Encounter (HOSPITAL_COMMUNITY): Payer: Self-pay

## 2017-09-12 ENCOUNTER — Inpatient Hospital Stay (HOSPITAL_COMMUNITY)
Admission: AD | Admit: 2017-09-12 | Discharge: 2017-09-20 | DRG: 885 | Disposition: A | Discharge status: Rehabilitation Facility | Payer: Federal, State, Local not specified - Other | Source: Intra-hospital | Attending: Psychiatry | Admitting: Psychiatry

## 2017-09-12 DIAGNOSIS — F1099 Alcohol use, unspecified with unspecified alcohol-induced disorder: Secondary | ICD-10-CM | POA: Diagnosis not present

## 2017-09-12 DIAGNOSIS — Y907 Blood alcohol level of 200-239 mg/100 ml: Secondary | ICD-10-CM | POA: Diagnosis not present

## 2017-09-12 DIAGNOSIS — F1014 Alcohol abuse with alcohol-induced mood disorder: Secondary | ICD-10-CM

## 2017-09-12 DIAGNOSIS — R45851 Suicidal ideations: Secondary | ICD-10-CM | POA: Diagnosis present

## 2017-09-12 DIAGNOSIS — G4710 Hypersomnia, unspecified: Secondary | ICD-10-CM | POA: Diagnosis present

## 2017-09-12 DIAGNOSIS — F332 Major depressive disorder, recurrent severe without psychotic features: Secondary | ICD-10-CM | POA: Diagnosis not present

## 2017-09-12 DIAGNOSIS — G4700 Insomnia, unspecified: Secondary | ICD-10-CM | POA: Diagnosis present

## 2017-09-12 DIAGNOSIS — F17210 Nicotine dependence, cigarettes, uncomplicated: Secondary | ICD-10-CM | POA: Diagnosis not present

## 2017-09-12 DIAGNOSIS — M549 Dorsalgia, unspecified: Secondary | ICD-10-CM | POA: Diagnosis not present

## 2017-09-12 DIAGNOSIS — F322 Major depressive disorder, single episode, severe without psychotic features: Secondary | ICD-10-CM

## 2017-09-12 DIAGNOSIS — F1120 Opioid dependence, uncomplicated: Secondary | ICD-10-CM | POA: Diagnosis present

## 2017-09-12 DIAGNOSIS — F1320 Sedative, hypnotic or anxiolytic dependence, uncomplicated: Secondary | ICD-10-CM | POA: Diagnosis not present

## 2017-09-12 DIAGNOSIS — F411 Generalized anxiety disorder: Secondary | ICD-10-CM | POA: Diagnosis not present

## 2017-09-12 DIAGNOSIS — F1390 Sedative, hypnotic, or anxiolytic use, unspecified, uncomplicated: Secondary | ICD-10-CM | POA: Diagnosis not present

## 2017-09-12 LAB — RAPID URINE DRUG SCREEN, HOSP PERFORMED
Amphetamines: NOT DETECTED
BENZODIAZEPINES: NOT DETECTED
Barbiturates: NOT DETECTED
Cocaine: NOT DETECTED
Opiates: NOT DETECTED
Tetrahydrocannabinol: NOT DETECTED

## 2017-09-12 MED ORDER — LORAZEPAM 2 MG/ML IJ SOLN: 0.0000 mg | Freq: Four times a day (QID) | INTRAMUSCULAR | Status: DC

## 2017-09-12 MED ORDER — FLUOXETINE HCL 10 MG PO CAPS
10.0000 mg | ORAL_CAPSULE | Freq: Every day | ORAL | Status: DC
  Administered 2017-09-12: 10 mg via ORAL
  Filled 2017-09-12: qty 1

## 2017-09-12 MED ORDER — THIAMINE HCL 100 MG/ML IJ SOLN: 100.0000 mg | Freq: Every day | INTRAMUSCULAR | Status: DC

## 2017-09-12 MED ORDER — NICOTINE 21 MG/24HR TD PT24
21.0000 mg | MEDICATED_PATCH | Freq: Every day | TRANSDERMAL | Status: DC
  Administered 2017-09-13 – 2017-09-19 (×7): 21 mg via TRANSDERMAL
  Filled 2017-09-12 (×10): qty 1

## 2017-09-12 MED ORDER — ALUM & MAG HYDROXIDE-SIMETH 200-200-20 MG/5ML PO SUSP: 30.0000 mL | ORAL | Status: DC | PRN

## 2017-09-12 MED ORDER — VITAMIN B-1 100 MG PO TABS
100.0000 mg | ORAL_TABLET | Freq: Every day | ORAL | Status: DC
  Filled 2017-09-12 (×2): qty 1

## 2017-09-12 MED ORDER — HYDROXYZINE HCL 25 MG PO TABS
25.0000 mg | ORAL_TABLET | Freq: Three times a day (TID) | ORAL | Status: DC | PRN
  Administered 2017-09-12: 25 mg via ORAL
  Filled 2017-09-12: qty 1

## 2017-09-12 MED ORDER — MAGNESIUM HYDROXIDE 400 MG/5ML PO SUSP: 30.0000 mL | Freq: Every day | ORAL | Status: DC | PRN

## 2017-09-12 MED ORDER — ONDANSETRON HCL 4 MG PO TABS: 4.0000 mg | ORAL_TABLET | Freq: Three times a day (TID) | ORAL | Status: DC | PRN

## 2017-09-12 MED ORDER — LORAZEPAM 1 MG PO TABS
0.0000 mg | ORAL_TABLET | Freq: Four times a day (QID) | ORAL | Status: DC
  Administered 2017-09-12: 1 mg via ORAL
  Administered 2017-09-13: 2 mg via ORAL
  Filled 2017-09-12: qty 2
  Filled 2017-09-12: qty 1

## 2017-09-12 MED ORDER — LORAZEPAM 2 MG/ML IJ SOLN: 0.0000 mg | Freq: Two times a day (BID) | INTRAMUSCULAR | Status: DC

## 2017-09-12 MED ORDER — HALOPERIDOL 5 MG PO TABS
5.0000 mg | ORAL_TABLET | Freq: Four times a day (QID) | ORAL | Status: DC | PRN
  Administered 2017-09-12 – 2017-09-13 (×2): 5 mg via ORAL
  Filled 2017-09-12 (×2): qty 1

## 2017-09-12 MED ORDER — TRAZODONE HCL 50 MG PO TABS
50.0000 mg | ORAL_TABLET | Freq: Every evening | ORAL | Status: DC | PRN
  Administered 2017-09-13 – 2017-09-14 (×3): 50 mg via ORAL
  Filled 2017-09-12 (×2): qty 1

## 2017-09-12 MED ORDER — BENZTROPINE MESYLATE 1 MG PO TABS
1.0000 mg | ORAL_TABLET | Freq: Four times a day (QID) | ORAL | Status: DC | PRN
  Administered 2017-09-12 – 2017-09-13 (×2): 1 mg via ORAL
  Filled 2017-09-12 (×2): qty 1

## 2017-09-12 MED ORDER — IBUPROFEN 600 MG PO TABS
600.0000 mg | ORAL_TABLET | Freq: Three times a day (TID) | ORAL | Status: DC | PRN
  Administered 2017-09-12 – 2017-09-19 (×9): 600 mg via ORAL
  Filled 2017-09-12 (×8): qty 1

## 2017-09-12 MED ORDER — GABAPENTIN 100 MG PO CAPS
200.0000 mg | ORAL_CAPSULE | Freq: Two times a day (BID) | ORAL | Status: DC
  Administered 2017-09-12: 200 mg via ORAL
  Filled 2017-09-12: qty 2

## 2017-09-12 MED ORDER — FLUOXETINE HCL 10 MG PO CAPS
10.0000 mg | ORAL_CAPSULE | Freq: Every day | ORAL | Status: DC
  Administered 2017-09-13: 10 mg via ORAL
  Filled 2017-09-12 (×3): qty 1

## 2017-09-12 MED ORDER — ACETAMINOPHEN 325 MG PO TABS
650.0000 mg | ORAL_TABLET | Freq: Four times a day (QID) | ORAL | Status: DC | PRN
  Administered 2017-09-13 – 2017-09-18 (×8): 650 mg via ORAL
  Filled 2017-09-12 (×8): qty 2

## 2017-09-12 MED ORDER — GABAPENTIN 100 MG PO CAPS
200.0000 mg | ORAL_CAPSULE | Freq: Two times a day (BID) | ORAL | Status: DC
  Administered 2017-09-12 – 2017-09-13 (×2): 200 mg via ORAL
  Filled 2017-09-12 (×6): qty 2

## 2017-09-12 MED ORDER — LORAZEPAM 1 MG PO TABS: 0.0000 mg | ORAL_TABLET | Freq: Two times a day (BID) | ORAL | Status: DC

## 2017-09-12 NOTE — Progress Notes (Signed)
Pt is recommended for inpt treatment per Nira Conn, NP. [facility] is at capacity per [facility]. TTS to seek placement. EDP Jaynie Crumble, PA-C and pt's nurse Sarah, RN have been advised of the disposition.   Princess Bruins, MSW, LCSW Therapeutic Triage Specialist  [phone]

## 2017-09-12 NOTE — Progress Notes (Signed)
Adult Psychoeducational Group Note  Date:  09/12/2017 Time:  9:16 PM  Group Topic/Focus:  Wrap-Up Group:   The focus of this group is to help patients review their daily goal of treatment and discuss progress on daily workbooks.  Participation Level:  Did Not Attend  Participation Quality:  Did not attend  Affect:  Did not attend  Cognitive:  Did not attend  Insight: None  Engagement in Group:  Did not attend  Modes of Intervention:  Discussion  Additional Comments:  Pt did not attend group.  Leo Grosser 09/12/2017, 9:16 PM

## 2017-09-12 NOTE — ED Notes (Signed)
Pt transported to [facility] by [facility] transportation. All belongings returned to pt who signed for same.  

## 2017-09-12 NOTE — BH Assessment (Addendum)
Assessment Note  Larry Adams is an 32 y.o. male who presents to the ED voluntarily due to [facility] with a plan to either hang himself or OD on heroin. Pt states his son's mother took their child away from him about 6 months ago and he has not seen him since. Pt states his son's first birthday is April 1st and he feels worthless because he cannot see his son. Pt states he has no desire to live. Pt states he has been using heroin daily, as much as he can get. Pt states he recently had thoughts of using enough heroin to OD. Pt states he is not working, has no desire or motivation to go on with his life. Pt states he has "lost all hope and just wants to die." Pt is crying throughout the assessment and states to this writer if he does not get help, he feels that he is going to kill himself. Pt denies HI and denies AVH to this Clinical research associate. Pt has a hx of inpt admissions in 2016 c/o polysubstance abuse and SI. Pt states he has minimal supports and no current OPT provider.   Pt is recommended for inpt treatment per Nira Conn, NP. [facility] is at capacity per [facility]. TTS to seek placement. EDP Jaynie Crumble, PA-C and pt's nurse Sarah, RN have been advised of the disposition.   Diagnosis: MDD, recurrent, w/o psychosis; Generalized Anxiety disorder, severe Alcohol use disorder, moderate; Sedative use disorder, severe   Past Medical History:  Past Medical History:  Diagnosis Date  . Anxiety   . Back pain   . Mental disorder     Past Surgical History:  Procedure Laterality Date  . HERNIA REPAIR      Family History: History reviewed. No pertinent family history.  Social History:  reports that he has been smoking cigarettes.  He has been smoking about 1.00 pack per day. He has quit using smokeless tobacco. He reports that he drinks alcohol. He reports that he uses drugs. Drugs: Benzodiazepines and IV. Frequency: 5.00 times per week.  Additional Social History:  Alcohol / Drug Use Pain Medications: See MAR   Prescriptions: See MAR  Over the Counter: See MAR  History of alcohol / drug use?: Yes Substance #1 Name of Substance 1: Heroin  1 - Age of First Use: 20s 1 - Amount (size/oz): pt states "as much as I can" 1 - Frequency: daily 1 - Duration: ongoing 1 - Last Use / Amount: 09/11/17 Substance #2 Name of Substance 2: Alcohol 2 - Age of First Use: teens 2 - Amount (size/oz): varies 2 - Frequency: occasional 2 - Duration: ongoing 2 - Last Use / Amount: 09/11/17  CIWA: CIWA-Ar BP: (!) 125/103 Pulse Rate: 100 COWS:    Allergies:  Allergies  Allergen Reactions  . Morphine And Related Hives    Home Medications:  (Not in a hospital admission)  OB/GYN Status:  No LMP for male patient.  General Assessment Data Location of Assessment: [facility] ED TTS Assessment: In system Is this a Tele or Face-to-Face Assessment?: Face-to-Face Is this an Initial Assessment or a Re-assessment for this encounter?: Initial Assessment Marital status: Single Is patient pregnant?: No Pregnancy Status: No Living Arrangements: Other relatives Can pt return to current living arrangement?: Yes Admission Status: Voluntary Is patient capable of signing voluntary admission?: Yes Referral Source: Self/Family/Friend Insurance type: Medicaid     Crisis Care Plan Living Arrangements: Other relatives Name of Psychiatrist: none Name of Therapist: none  Education Status Is patient  currently in school?: No Is the patient employed, unemployed or receiving disability?: Unemployed  Risk to self with the past 6 months Suicidal Ideation: Yes-Currently Present Has patient been a risk to self within the past 6 months prior to admission? : Yes Suicidal Intent: Yes-Currently Present Has patient had any suicidal intent within the past 6 months prior to admission? : Yes Is patient at risk for suicide?: Yes Suicidal Plan?: Yes-Currently Present Has patient had any suicidal plan within the past 6 months prior to  admission? : Yes Specify Current Suicidal Plan: pt states he wants to hang himself or OD on heroin  Access to Means: Yes Specify Access to Suicidal Means: pt has access to heroin and items that could be used to hang himself  What has been your use of drugs/alcohol within the last 12 months?: reports to using excessive amounts of heroin and alcohol  Previous Attempts/Gestures: No Triggers for Past Attempts: None known Intentional Self Injurious Behavior: None Family Suicide History: No Recent stressful life event(s): Loss (Comment), Financial Problems(son lives with mother, has not seen in 6 months ) Persecutory voices/beliefs?: No Depression: Yes Depression Symptoms: Despondent, Insomnia, Tearfulness, Isolating, Fatigue, Loss of interest in usual pleasures, Feeling worthless/self pity, Feeling angry/irritable, Guilt Substance abuse history and/or treatment for substance abuse?: Yes Suicide prevention information given to non-admitted patients: Not applicable  Risk to Others within the past 6 months Homicidal Ideation: No Does patient have any lifetime risk of violence toward others beyond the six months prior to admission? : No Thoughts of Harm to Others: No Current Homicidal Intent: No Current Homicidal Plan: No Access to Homicidal Means: No History of harm to others?: No Assessment of Violence: None Noted Does patient have access to weapons?: No Criminal Charges Pending?: No Does patient have a court date: No Is patient on probation?: No  Psychosis Hallucinations: None noted Delusions: None noted  Mental Status Report Appearance/Hygiene: In scrubs, Unremarkable Eye Contact: Good Motor Activity: Freedom of movement Speech: Logical/coherent Level of Consciousness: Alert, Crying Mood: Depressed, Anxious, Helpless, Sad, Sullen, Worthless, low self-esteem, Despair Affect: Anxious, Depressed, Sad Anxiety Level: Severe Thought Processes: Relevant, Coherent Judgement:  Impaired Orientation: Person, Place, Time, Situation, Appropriate for developmental age Obsessive Compulsive Thoughts/Behaviors: None  Cognitive Functioning Concentration: Normal Memory: Remote Intact, Recent Intact Is patient IDD: No Is patient DD?: No Insight: Poor Impulse Control: Poor Appetite: Good Have you had any weight changes? : No Change Sleep: Decreased Total Hours of Sleep: 4 Vegetative Symptoms: None  ADLScreening [facility] Assessment Services) Patient's cognitive ability adequate to safely complete daily activities?: Yes Patient able to express need for assistance with ADLs?: Yes Independently performs ADLs?: Yes (appropriate for developmental age)  Prior Inpatient Therapy Prior Inpatient Therapy: Yes Prior Therapy Dates: 2016 Prior Therapy Facilty/Provider(s): [facility] Reason for Treatment: HI, SI  Prior Outpatient Therapy Prior Outpatient Therapy: No Does patient have an ACCT team?: No Does patient have Intensive In-House Services?  : No Does patient have [facility] services? : No Does patient have P4CC services?: No  ADL Screening (condition at time of admission) Patient's cognitive ability adequate to safely complete daily activities?: Yes Is the patient deaf or have difficulty hearing?: No Does the patient have difficulty seeing, even when wearing glasses/contacts?: No Does the patient have difficulty concentrating, remembering, or making decisions?: No Patient able to express need for assistance with ADLs?: Yes Does the patient have difficulty dressing or bathing?: No Independently performs ADLs?: Yes (appropriate for developmental age) Does the patient have difficulty walking  or climbing stairs?: No Weakness of Legs: None Weakness of Arms/Hands: None  Home Assistive Devices/Equipment Home Assistive Devices/Equipment: None    Abuse/Neglect Assessment (Assessment to be complete while patient is alone) Abuse/Neglect Assessment Can Be Completed: Yes Physical  Abuse: Denies Verbal Abuse: Denies Sexual Abuse: Denies Exploitation of patient/patient's resources: Denies Self-Neglect: Denies     Merchant navy officer (For Healthcare) Does Patient Have a Medical Advance Directive?: No Would patient like information on creating a medical advance directive?: No - Patient declined    Additional Information 1:1 In Past 12 Months?: No CIRT Risk: No Elopement Risk: No Does patient have medical clearance?: Yes     Disposition: Pt is recommended for inpt treatment per Nira Conn, NP. [facility] is at capacity per [facility]. TTS to seek placement. EDP Jaynie Crumble, PA-C and pt's nurse Sarah, RN have been advised of the disposition.   Disposition Initial Assessment Completed for this Encounter: Yes Disposition of Patient: Admit Type of inpatient treatment program: Adult(per Nira Conn, NP) Patient refused recommended treatment: No  On Site Evaluation by:   Reviewed with Physician:    Karolee Ohs 09/12/2017 12:37 AM

## 2017-09-12 NOTE — Plan of Care (Signed)
  Education: Knowledge of [facility] Education information/materials will improve 09/12/2017 1838 - Progressing by Shela Nevin, RN

## 2017-09-12 NOTE — Progress Notes (Signed)
Pt is a 32 yr old male admitted from [facility] ED- came in voluntarily to ED for depression and suicidal ideation. Pt was calm and cooperative during admission interview -somewhat guarded -poor eye contact and teary at times. Pt reports that he wasn't suicidal, but just want to get help. Patient distraught about the fact that he hasn't seen his son (who will soon be one), since November. Pt reports that his ex took him to another state. Pt reports that he has been using heroin- and used 2 days ago. Pt reports that if he couldn't get drugs, he would start drinking. Pt's BAL 218 last night. Pt currently denies SI/HI and A/V hallucinations. Skin assessment performed with no contraband nor abnormalities found- except several stretch marks on abdomen. Admission paperwork completed and signed. Belongings secured in locker. Pt oriented to unit. Q 15 minute checks initiated.

## 2017-09-12 NOTE — Tx Team (Signed)
Initial Treatment Plan 09/12/2017 7:06 PM [name] LGX:[id number]    PATIENT STRESSORS: Marital or family conflict Substance abuse   PATIENT STRENGTHS: Capable of independent living Communication skills Motivation for treatment/growth Supportive family/friends   PATIENT IDENTIFIED PROBLEMS:   "detox"    "I'd like to get myself together"    "Been going crazy since my ex took off with my son in November"           DISCHARGE CRITERIA:  Improved stabilization in mood, thinking, and/or behavior Motivation to continue treatment in a less acute level of care Withdrawal symptoms are absent or subacute and managed without 24-hour nursing intervention  PRELIMINARY DISCHARGE PLAN: Attend aftercare/continuing care group Outpatient therapy Return to previous living arrangement  PATIENT/FAMILY INVOLVEMENT: This treatment plan has been presented to and reviewed with the patient, Larry Adams, The patient has been given the opportunity to ask questions and make suggestions.  Shela Nevin, RN 09/12/2017, 7:06 PM

## 2017-09-12 NOTE — ED Notes (Signed)
Patient wanded by security. One labeled patient belonging bag transferred with patient.

## 2017-09-12 NOTE — Progress Notes (Signed)
D: Patient observed resting in bed at the start of this writer's shift. Patient up at NS shortly thereafter clearly in distress. Patient states I'm in a bad way. I feel so awful, both physically and emotionally. I've been using so much to cope. I miss my son so much. Can I have something to help my withdrawal? I think I need an antipsychotic. I'm restless, sweaty, shaky, anxious and agitated.   A: Medicated per orders, prn vistaril given for anxiety, haldon given for agitation and congentin given per linked order. Level III obs in place for safety. Emotional support offered. Explained ativan protcol and informed patient next dose would be at midnight, if patient is awake.   R: Patient verbalizes understanding of POC. On reassess, patient is asleep. Patient denies SI/HI/AVH and remains safe on level III obs. Will continue to monitor closely and offer ativan should patient awaken with complaints of withdrawal.

## 2017-09-12 NOTE — ED Notes (Signed)
Bed: [facility] Expected date:  Expected time:  Means of arrival:  Comments: Hold for wtr6

## 2017-09-13 DIAGNOSIS — F4010 Social phobia, unspecified: Secondary | ICD-10-CM

## 2017-09-13 DIAGNOSIS — F1320 Sedative, hypnotic or anxiolytic dependence, uncomplicated: Secondary | ICD-10-CM

## 2017-09-13 DIAGNOSIS — F332 Major depressive disorder, recurrent severe without psychotic features: Principal | ICD-10-CM

## 2017-09-13 DIAGNOSIS — F1014 Alcohol abuse with alcohol-induced mood disorder: Secondary | ICD-10-CM

## 2017-09-13 DIAGNOSIS — F411 Generalized anxiety disorder: Secondary | ICD-10-CM

## 2017-09-13 DIAGNOSIS — R450 Nervousness: Secondary | ICD-10-CM

## 2017-09-13 DIAGNOSIS — R45851 Suicidal ideations: Secondary | ICD-10-CM

## 2017-09-13 MED ORDER — ADULT MULTIVITAMIN W/MINERALS CH
1.0000 | ORAL_TABLET | Freq: Every day | ORAL | Status: DC
  Administered 2017-09-13 – 2017-09-19 (×7): 1 via ORAL
  Filled 2017-09-13 (×10): qty 1

## 2017-09-13 MED ORDER — GABAPENTIN 100 MG PO CAPS
200.0000 mg | ORAL_CAPSULE | Freq: Three times a day (TID) | ORAL | Status: DC
  Administered 2017-09-13 – 2017-09-14 (×4): 200 mg via ORAL
  Filled 2017-09-13 (×9): qty 2

## 2017-09-13 MED ORDER — HALOPERIDOL 5 MG PO TABS
5.0000 mg | ORAL_TABLET | Freq: Four times a day (QID) | ORAL | Status: DC | PRN
  Administered 2017-09-13: 5 mg via ORAL
  Filled 2017-09-13: qty 1

## 2017-09-13 MED ORDER — LORAZEPAM 1 MG PO TABS: 1.0000 mg | ORAL_TABLET | Freq: Every day | ORAL | Status: DC

## 2017-09-13 MED ORDER — METHOCARBAMOL 500 MG PO TABS
500.0000 mg | ORAL_TABLET | Freq: Four times a day (QID) | ORAL | Status: DC | PRN
  Administered 2017-09-13 – 2017-09-19 (×6): 500 mg via ORAL
  Filled 2017-09-13 (×6): qty 1

## 2017-09-13 MED ORDER — VENLAFAXINE HCL ER 37.5 MG PO CP24
37.5000 mg | ORAL_CAPSULE | Freq: Every day | ORAL | Status: DC
  Administered 2017-09-13 – 2017-09-14 (×2): 37.5 mg via ORAL
  Filled 2017-09-13 (×5): qty 1

## 2017-09-13 MED ORDER — LORAZEPAM 1 MG PO TABS: 1.0000 mg | ORAL_TABLET | Freq: Four times a day (QID) | ORAL | Status: DC | PRN

## 2017-09-13 MED ORDER — MIRTAZAPINE 15 MG PO TABS
15.0000 mg | ORAL_TABLET | Freq: Every day | ORAL | Status: DC
  Administered 2017-09-13 – 2017-09-19 (×7): 15 mg via ORAL
  Filled 2017-09-13: qty 1
  Filled 2017-09-13: qty 14
  Filled 2017-09-13 (×7): qty 1

## 2017-09-13 MED ORDER — LORAZEPAM 1 MG PO TABS: 1.0000 mg | ORAL_TABLET | Freq: Two times a day (BID) | ORAL | Status: DC

## 2017-09-13 MED ORDER — ONDANSETRON 4 MG PO TBDP: 4.0000 mg | ORAL_TABLET | Freq: Four times a day (QID) | ORAL | Status: AC | PRN

## 2017-09-13 MED ORDER — BENZTROPINE MESYLATE 1 MG PO TABS
1.0000 mg | ORAL_TABLET | Freq: Four times a day (QID) | ORAL | Status: DC | PRN
  Administered 2017-09-13: 1 mg via ORAL
  Filled 2017-09-13: qty 1

## 2017-09-13 MED ORDER — HYDROXYZINE HCL 25 MG PO TABS
25.0000 mg | ORAL_TABLET | Freq: Four times a day (QID) | ORAL | Status: DC | PRN
  Administered 2017-09-13: 25 mg via ORAL
  Filled 2017-09-13: qty 1

## 2017-09-13 MED ORDER — VITAMIN B-1 100 MG PO TABS
100.0000 mg | ORAL_TABLET | Freq: Every day | ORAL | Status: DC
  Administered 2017-09-14 – 2017-09-19 (×6): 100 mg via ORAL
  Filled 2017-09-13 (×8): qty 1

## 2017-09-13 MED ORDER — HYDROXYZINE HCL 25 MG PO TABS
25.0000 mg | ORAL_TABLET | Freq: Four times a day (QID) | ORAL | Status: DC | PRN
  Administered 2017-09-13 (×2): 25 mg via ORAL
  Filled 2017-09-13 (×2): qty 1

## 2017-09-13 MED ORDER — CHLORDIAZEPOXIDE HCL 25 MG PO CAPS
25.0000 mg | ORAL_CAPSULE | Freq: Four times a day (QID) | ORAL | Status: DC | PRN
  Administered 2017-09-13: 25 mg via ORAL
  Filled 2017-09-13: qty 1

## 2017-09-13 MED ORDER — LORAZEPAM 1 MG PO TABS
1.0000 mg | ORAL_TABLET | Freq: Four times a day (QID) | ORAL | Status: DC
  Administered 2017-09-13: 1 mg via ORAL
  Filled 2017-09-13: qty 1

## 2017-09-13 MED ORDER — LOPERAMIDE HCL 2 MG PO CAPS: 2.0000 mg | ORAL_CAPSULE | ORAL | Status: AC | PRN

## 2017-09-13 MED ORDER — LORAZEPAM 1 MG PO TABS: 1.0000 mg | ORAL_TABLET | Freq: Three times a day (TID) | ORAL | Status: DC

## 2017-09-13 NOTE — H&P (Signed)
Psychiatric Admission Assessment Adult  Patient Identification: Larry Adams MRN:  [id number] Date of Evaluation:  09/13/2017 Chief Complaint:  MDD,rec without psychosis GAD Alcohol use,sev Sedative use,sev Principal Diagnosis: MDD (major depressive disorder), recurrent severe, without psychosis ([place]) Diagnosis:   Patient Active Problem List   Diagnosis Date Noted  . Major depressive disorder, single episode, severe ([place]) [F32.2] 09/12/2017  . Alcohol abuse with alcohol-induced mood disorder ([place]) [F10.14] 09/12/2017  . MDD (major depressive disorder), recurrent severe, without psychosis ([place]) [F33.2] 09/12/2017  . Benzodiazepine dependence ([place]) [F13.20] 07/23/2014  . Alcohol abuse [F10.10] 07/23/2014  . Substance induced mood disorder ([place]) [F19.94] 07/23/2014  . Substance-induced anxiety disorder [facility]) [F19.980] 07/23/2014   History of Present Illness:  09/12/17 [facility] Counselor Assessment: 32 y.o. male who presents to the ED voluntarily due to [facility] with a plan to either hang himself or OD on heroin. Pt states his son's mother took their child away from him about 6 months ago and he has not seen him since. Pt states his son's first birthday is April 1st and he feels worthless because he cannot see his son. Pt states he has no desire to live. Pt states he has been using heroin daily, as much as he can get. Pt states he recently had thoughts of using enough heroin to OD. Pt states he is not working, has no desire or motivation to go on with his life. Pt states he has "lost all hope and just wants to die." Pt is crying throughout the assessment and states to this writer if he does not get help, he feels that he is going to kill himself. Pt denies HI and denies AVH to this Probation officer. Pt has a hx of inpt admissions in 2016 c/o polysubstance abuse and SI. Pt states he has minimal supports and no current OPT provider.  On Evaluation: Patient confirms the above information. He currently denies any  SI/HI/AVH and contracts for safety. He states that he uses 1/2 gram of Heroine a day and 2 "Tall Boys" a day. He states that he has a history of depression and has used medications of Seroquel, Lexapro, Prozac, Zoloft, Neurontin, and Cymbalta. He only liked Cymbalta. He has attended NA in the past but gave up on it as he felt he got nothing out of it. He wants to go to [facility] and he has already told the CSW.     Associated Signs/Symptoms: Depression Symptoms:  depressed mood, anhedonia, hypersomnia, fatigue, feelings of worthlessness/guilt, hopelessness, suicidal thoughts with specific plan, anxiety, loss of energy/fatigue, disturbed sleep, (Hypo) Manic Symptoms:  Flight of Ideas, Community education officer, Anxiety Symptoms:  Excessive Worry, Social Anxiety, Psychotic Symptoms:  Denies PTSD Symptoms: NA Total Time spent with patient: 45 minutes  Past Psychiatric History: Long history of drug abuse and reported depression and anxiety  Is the patient at risk to self? Yes.    Has the patient been a risk to self in the past 6 months? Yes.    Has the patient been a risk to self within the distant past? Yes.    Is the patient a risk to others? No.  Has the patient been a risk to others in the past 6 months? No.  Has the patient been a risk to others within the distant past? No.   Prior Inpatient Therapy:   Prior Outpatient Therapy:    Alcohol Screening: 1. How often do you have a drink containing alcohol?: 4 or more times a week 2. How many drinks containing  alcohol do you have on a typical day when you are drinking?: 5 or 6 3. How often do you have six or more drinks on one occasion?: Daily or almost daily AUDIT-C Score: 10 4. How often during the last year have you found that you were not able to stop drinking once you had started?: Monthly 5. How often during the last year have you failed to do what was normally expected from you becasue of drinking?: Monthly 6. How  often during the last year have you needed a first drink in the morning to get yourself going after a heavy drinking session?: Daily or almost daily 7. How often during the last year have you had a feeling of guilt of remorse after drinking?: Weekly 8. How often during the last year have you been unable to remember what happened the night before because you had been drinking?: Weekly 9. Have you or someone else been injured as a result of your drinking?: Yes, but not in the last year 10. Has a relative or friend or a doctor or another health worker been concerned about your drinking or suggested you cut down?: Yes, but not in the last year Alcohol Use Disorder Identification Test Final Score (AUDIT): 28 Intervention/Follow-up: Alcohol Education, Continued Monitoring, Medication Offered/Prescribed Substance Abuse History in the last 12 months:  Yes.   Consequences of Substance Abuse: Medical Consequences:  reviewed Legal Consequences:  reviewed Family Consequences:  reviewed Previous Psychotropic Medications: Yes  Psychological Evaluations: Yes  Past Medical History:  Past Medical History:  Diagnosis Date  . Anxiety   . Back pain   . Mental disorder     Past Surgical History:  Procedure Laterality Date  . HERNIA REPAIR     Family History: History reviewed. No pertinent family history. Family Psychiatric  History: Significant substance abuse in family Tobacco Screening:   Social History:  Social History   Substance and Sexual Activity  Alcohol Use Yes   Comment: pint of liquor a day; varies     Social History   Substance and Sexual Activity  Drug Use Yes  . Frequency: 5.0 times per week  . Types: Benzodiazepines, IV   Comment: reports that he is on klonopin and xanax; hx of marijuana use but drug screen presently negative    Additional Social History:        Allergies:   Allergies  Allergen Reactions  . Morphine And Related Hives   Lab Results:  Results for orders  placed or performed during the hospital encounter of 09/11/17 (from the past 48 hour(s))  Comprehensive metabolic panel     Status: Abnormal   Collection Time: 09/11/17  9:34 PM  Result Value Ref Range   Sodium 147 (H) 135 - 145 mmol/L   Potassium 3.5 3.5 - 5.1 mmol/L   Chloride 115 (H) 101 - 111 mmol/L   CO2 22 22 - 32 mmol/L   Glucose, Bld 103 (H) 65 - 99 mg/dL   BUN <5 (L) 6 - 20 mg/dL   Creatinine, Ser 0.78 0.61 - 1.24 mg/dL   Calcium 8.9 8.9 - 10.3 mg/dL   Total Protein 7.1 6.5 - 8.1 g/dL   Albumin 3.6 3.5 - 5.0 g/dL   AST 59 (H) 15 - 41 U/L   ALT 39 17 - 63 U/L   Alkaline Phosphatase 80 38 - 126 U/L   Total Bilirubin 1.4 (H) 0.3 - 1.2 mg/dL   GFR calc non Af Amer >60 >60 mL/min   GFR  calc Af Amer >60 >60 mL/min    Comment: (NOTE) The eGFR has been calculated using the CKD EPI equation. This calculation has not been validated in all clinical situations. eGFR's persistently <60 mL/min signify possible Chronic Kidney Disease.    Anion gap 10 5 - 15    Comment: Performed at [facility], [place] [address] [place], [place] [address]  Ethanol     Status: Abnormal   Collection Time: 09/11/17  9:34 PM  Result Value Ref Range   Alcohol, Ethyl (B) 218 (H) <10 mg/dL    Comment:        LOWEST DETECTABLE LIMIT FOR SERUM ALCOHOL IS 10 mg/dL FOR MEDICAL PURPOSES ONLY Performed at [facility], [place] [address], [place], [place] [address]   Salicylate level     Status: None   Collection Time: 09/11/17  9:34 PM  Result Value Ref Range   Salicylate Lvl <5.1 2.8 - 30.0 mg/dL    Comment: Performed at [facility], [place] [address] [place], [place] [address]  Acetaminophen level     Status: Abnormal   Collection Time: 09/11/17  9:34 PM  Result Value Ref Range   Acetaminophen (Tylenol), Serum <10 (L) 10 - 30 ug/mL    Comment:        THERAPEUTIC CONCENTRATIONS VARY SIGNIFICANTLY. A RANGE OF 10-30 ug/mL MAY BE AN  EFFECTIVE CONCENTRATION FOR MANY PATIENTS. HOWEVER, SOME ARE BEST TREATED AT CONCENTRATIONS OUTSIDE THIS RANGE. ACETAMINOPHEN CONCENTRATIONS >150 ug/mL AT 4 HOURS AFTER INGESTION AND >50 ug/mL AT 12 HOURS AFTER INGESTION ARE OFTEN ASSOCIATED WITH TOXIC REACTIONS. Performed at [facility], [place] [address], [place], [place] [address]   cbc     Status: None   Collection Time: 09/11/17  9:34 PM  Result Value Ref Range   WBC 8.4 4.0 - 10.5 K/uL   RBC 5.18 4.22 - 5.81 MIL/uL   Hemoglobin 16.8 13.0 - 17.0 g/dL   HCT 48.5 39.0 - 52.0 %   MCV 93.6 78.0 - 100.0 fL   MCH 32.4 26.0 - 34.0 pg   MCHC 34.6 30.0 - 36.0 g/dL   RDW 13.5 11.5 - 15.5 %   Platelets 246 150 - 400 K/uL    Comment: Performed at [facility], [place] [address], [place], [place] [address]  Rapid urine drug screen (hospital performed)     Status: None   Collection Time: 09/12/17  4:34 AM  Result Value Ref Range   Opiates NONE DETECTED NONE DETECTED   Cocaine NONE DETECTED NONE DETECTED   Benzodiazepines NONE DETECTED NONE DETECTED   Amphetamines NONE DETECTED NONE DETECTED   Tetrahydrocannabinol NONE DETECTED NONE DETECTED   Barbiturates NONE DETECTED NONE DETECTED    Comment: (NOTE) DRUG SCREEN FOR MEDICAL PURPOSES ONLY.  IF CONFIRMATION IS NEEDED FOR ANY PURPOSE, NOTIFY LAB WITHIN 5 DAYS. LOWEST DETECTABLE LIMITS FOR URINE DRUG SCREEN Drug Class                     Cutoff (ng/mL) Amphetamine and metabolites    1000 Barbiturate and metabolites    200 Benzodiazepine                 614 Tricyclics and metabolites     300 Opiates and metabolites        300 Cocaine and metabolites        300 THC  50 Performed at [facility], [place] [address], [place],  [address]     Blood Alcohol level:  Lab Results  Component Value Date   ETH 218 (H) 09/11/2017   ETH 233 (H) 03/54/6568    Metabolic Disorder Labs:  No results found  for: HGBA1C, MPG No results found for: PROLACTIN No results found for: CHOL, TRIG, HDL, CHOLHDL, VLDL, LDLCALC  Current Medications: Current Facility-Administered Medications  Medication Dose Route Frequency Provider Last Rate Last Dose  . acetaminophen (TYLENOL) tablet 650 mg  650 mg Oral Q6H PRN Ethelene Hal, NP   650 mg at 09/13/17 0826  . alum & mag hydroxide-simeth (MAALOX/MYLANTA) 200-200-20 MG/5ML suspension 30 mL  30 mL Oral Q4H PRN Ethelene Hal, NP      . benztropine (COGENTIN) tablet 1 mg  1 mg Oral Q6H PRN Izediuno, Laruth Bouchard, MD       And  . haloperidol (HALDOL) tablet 5 mg  5 mg Oral Q6H PRN Izediuno, Laruth Bouchard, MD      . chlordiazePOXIDE (LIBRIUM) capsule 25 mg  25 mg Oral QID PRN Jaequan Propes, Lowry Ram, FNP      . gabapentin (NEURONTIN) capsule 200 mg  200 mg Oral TID Kilynn Fitzsimmons, Lowry Ram, FNP      . hydrOXYzine (ATARAX/VISTARIL) tablet 25 mg  25 mg Oral Q6H PRN Lindon Romp A, NP   25 mg at 09/13/17 0827  . ibuprofen (ADVIL,MOTRIN) tablet 600 mg  600 mg Oral Q8H PRN Ethelene Hal, NP   600 mg at 09/13/17 0147  . loperamide (IMODIUM) capsule 2-4 mg  2-4 mg Oral PRN Lindon Romp A, NP      . magnesium hydroxide (MILK OF MAGNESIA) suspension 30 mL  30 mL Oral Daily PRN Ethelene Hal, NP      . methocarbamol (ROBAXIN) tablet 500 mg  500 mg Oral Q6H PRN G[place]on Warzecha, Lowry Ram, FNP      . mirtazapine (REMERON) tablet 15 mg  15 mg Oral QHS Mariaceleste Herrera B, FNP      . multivitamin with minerals tablet 1 tablet  1 tablet Oral Daily Lindon Romp A, NP   1 tablet at 09/13/17 0920  . nicotine (NICODERM CQ - dosed in mg/24 hours) patch 21 mg  21 mg Transdermal Daily Ethelene Hal, NP   21 mg at 09/13/17 0747  . ondansetron (ZOFRAN-ODT) disintegrating tablet 4 mg  4 mg Oral Q6H PRN Rozetta Nunnery, NP      . Derrill Memo ON 09/14/2017] thiamine (VITAMIN B-1) tablet 100 mg  100 mg Oral Daily Lindon Romp A, NP      . traZODone (DESYREL) tablet 50 mg  50 mg Oral QHS PRN Ethelene Hal, NP   50 mg at 09/13/17 0149  . venlafaxine XR (EFFEXOR-XR) 24 hr capsule 37.5 mg  37.5 mg Oral Q breakfast Sirenity Shew, Lowry Ram, FNP       PTA Medications: Medications Prior to Admission  Medication Sig Dispense Refill Last Dose  . hydrOXYzine (ATARAX/VISTARIL) 25 MG tablet Take 1 tablet (25 mg total) by mouth every 6 (six) hours as needed for anxiety (agitation). (Patient not taking: Reported on 09/11/2017) 45 tablet 0 Not Taking at Unknown time  . Multiple Vitamin (MULTIVITAMIN WITH MINERALS) TABS tablet Take 1 tablet by mouth daily. For nutrition supplementation (Patient not taking: Reported on 09/11/2017)   Not Taking at Unknown time  . zolpidem (AMBIEN) 5 MG tablet Take 1 tablet (5 mg total) by  mouth at bedtime as needed for sleep. (Patient not taking: Reported on 09/11/2017) 7 tablet 0 Not Taking    Musculoskeletal: Strength & Muscle Tone: within normal limits Gait & Station: normal Patient leans: N/A  Psychiatric Specialty Exam: Physical Exam  Nursing note and vitals reviewed. Constitutional: He is oriented to person, place, and time. He appears well-developed and well-nourished.  Cardiovascular: Normal rate.  Respiratory: Effort normal.  Musculoskeletal: Normal range of motion.  Neurological: He is alert and oriented to person, place, and time.  Skin: Skin is warm.    Review of Systems  Constitutional: Negative.   HENT: Negative.   Eyes: Negative.   Respiratory: Negative.   Cardiovascular: Negative.   Gastrointestinal: Negative.   Genitourinary: Negative.   Musculoskeletal: Negative.   Skin: Negative.   Neurological: Negative.   Endo/Heme/Allergies: Negative.   Psychiatric/Behavioral: Positive for depression, substance abuse and suicidal ideas. Negative for hallucinations. The patient is nervous/anxious.     Blood pressure 120/79, pulse 85, temperature 98.5 F (36.9 C), temperature source Oral, resp. rate 20, height 6' (1.829 m), weight 132.9 kg (293 lb), SpO2  97 %.Body mass index is 39.74 kg/m.  General Appearance: Casual  Eye Contact:  Good  Speech:  Clear and Coherent and Normal Rate  Volume:  Normal  Mood:  Depressed  Affect:  Depressed and Flat  Thought Process:  Goal Directed and Descriptions of Associations: Intact  Orientation:  Full (Time, Place, and Person)  Thought Content:  WDL  Suicidal Thoughts:  No  Homicidal Thoughts:  No  Memory:  Immediate;   Good Recent;   Good Remote;   Good  Judgement:  Fair  Insight:  Fair  Psychomotor Activity:  Normal  Concentration:  Concentration: Good and Attention Span: Good  Recall:  Good  Fund of Knowledge:  Good  Language:  Good  Akathisia:  No  Handed:  Right  AIMS (if indicated):     Assets:  Communication Skills Desire for Improvement Financial Resources/Insurance Housing Physical Health Social Support Transportation  ADL's:  Intact  Cognition:  WNL  Sleep:  Number of Hours: 6    Treatment Plan Summary: Daily contact with patient to assess and evaluate symptoms and progress in treatment, Medication management and Plan is to:  -Encourage group therapy participation -See SRA and MAR for medication management  Observation Level/Precautions:  15 minute checks  Laboratory:  Reviewed  Psychotherapy:  Group therapy  Medications:  See [facility]  Consultations:  As needed  Discharge Concerns:  Relapse  Estimated LOS: 3-5 Days  Other:  Admit to [address] [facility] program on [place] for Primary Diagnosis: MDD (major depressive disorder), recurrent severe, without psychosis ([place]) Long Term Goal(s): Improvement in symptoms so as ready for discharge  Short Term Goals: Ability to identify changes in lifestyle to reduce recurrence of condition will improve, Ability to verbalize feelings will improve and Ability to disclose and discuss suicidal ideas  Physician Treatment Plan for Secondary Diagnosis: Principal Problem:   MDD (major depressive disorder), recurrent  severe, without psychosis ([place])  Long Term Goal(s): Improvement in symptoms so as ready for discharge  Short Term Goals: Ability to maintain clinical measurements within normal limits will improve, Compliance with prescribed medications will improve and Ability to identify triggers associated with substance abuse/mental health issues will improve  I certify that inpatient services furnished can reasonably be expected to improve the patient's condition.    Lewis Shock, FNP 3/18/201911:38 AM

## 2017-09-13 NOTE — Plan of Care (Signed)
Patient verbalizes understanding of information, education provided. 

## 2017-09-13 NOTE — Progress Notes (Signed)
Patient was asleep at 2400 CIWA. Up at NS just now with complaints of severe withdrawal - some may be related to his reported opiate use. UDS was negative however patient states he has been using daily. Complaining of sweats, harshness to light, tremors, shakes, restlessness, skin discomfort, generalized body aches of a 7/10, obvious sweats and very foul body odor. BP stable, pulse slightly elevated at 91. CIWA is a "14." Advil given along with prn trazadone. Medicated per ativan protocol. Patient encouraged to shower tomorrow morning. Patient appreciative and returned to room/bed. Will reassess in one hour.

## 2017-09-13 NOTE — Progress Notes (Cosign Needed)
Adult Psychoeducational Group Note  Date:  09/13/2017 Time:  11:47 AM  Group Topic/Focus:  Building Self Esteem:   The Focus of this group is helping patients become aware of the effects of self-esteem on their lives, the things they and others do that enhance or undermine their self-esteem, seeing the relationship between their level of self-esteem and the choices they make and learning ways to enhance self-esteem.  Participation Level:  Active  Participation Quality:  Appropriate  Affect:  Appropriate  Cognitive:  Appropriate  Insight: Appropriate, Good and Improving  Engagement in Group:  Engaged  Modes of Intervention:  Activity and Discussion  Additional Comments:  Pt attended group and participated in activity and discussion.  Laylah Riga R Tamikia Chowning 09/13/2017, 11:47 AM

## 2017-09-13 NOTE — Progress Notes (Signed)
Adult Psychoeducational Group Note  Date:  09/13/2017 Time:  8:39 PM  Group Topic/Focus:  Wrap-Up Group:   The focus of this group is to help patients review their daily goal of treatment and discuss progress on daily workbooks.  Participation Level:  Active  Participation Quality:  Appropriate  Affect:  Appropriate  Cognitive:  Alert  Insight: Appropriate  Engagement in Group:  Engaged  Modes of Intervention:  Discussion  Additional Comments:  Patient stated having a good day. Patient's goal for today was to talk to his mom. Patient met goal.   Larry Adams 09/13/2017, 8:39 PM

## 2017-09-13 NOTE — [facility] Suicide Risk Assessment (Signed)
[facility] Admission Suicide Risk Assessment   Nursing information obtained from:    Demographic factors:    Current Mental Status:    Loss Factors:    Historical Factors:    Risk Reduction Factors:     Total Time spent with patient: 45 minutes Principal Problem: MDD (major depressive disorder), recurrent severe, without psychosis ([facility]) Diagnosis:   Patient Active Problem List   Diagnosis Date Noted  . Alcohol abuse with alcohol-induced mood disorder (HCC) [F10.14] 09/12/2017  . MDD (major depressive disorder), recurrent severe, without psychosis (HCC) [F33.2] 09/12/2017  . Benzodiazepine dependence (HCC) [F13.20] 07/23/2014  . Alcohol abuse [F10.10] 07/23/2014  . Substance induced mood disorder (HCC) [F19.94] 07/23/2014  . Substance-induced anxiety disorder [facility]) [F19.980] 07/23/2014   Subjective Data:   32 y.o Caucasian male, single, lives with his mother, unemployed. Background history of SUD and mood disorder. Presented to the ER via emergency services. Expressed suicidal thoughts at home. His mother called emergency services. Main stressor inability to see his son. Toxicology is negative. UDS is negative. BAL 218 mg/dl. Reports long history of substances use and antisocial traits. Says he has been using a lot lately. Ruminates on his inability to see his son. No past suicidal behavior. No thoughts of violence. No thoughts of homicide. No evidence of mania, no evidence of psychosis. Wants to get into rehab.  He has been started on Mirtazapine and Venlafaxine. He consents to symptomatic opiate and alcohol withdrawal. No access to weapons. He is cooperative with care. He has agreed to treatment recommendations. He has agreed to communicate suicidal thoughts to staff if the thoughts becomes overwhelming.      Continued Clinical Symptoms:  Alcohol Use Disorder Identification Test Final Score (AUDIT): 28 The "Alcohol Use Disorders Identification Test", Guidelines for Use in Primary Care,  Second Edition.  World Science writer [facility]). Score between 0-7:  no or low risk or alcohol related problems. Score between 8-15:  moderate risk of alcohol related problems. Score between 16-19:  high risk of alcohol related problems. Score 20 or above:  warrants further diagnostic evaluation for alcohol dependence and treatment.   CLINICAL FACTORS:   Alcohol/Substance Abuse/Dependencies   Musculoskeletal: Strength & Muscle Tone: within normal limits Gait & Station: normal Patient leans: N/A  Psychiatric Specialty Exam: Physical Exam  Constitutional: He is oriented to person, place, and time. He appears well-developed and well-nourished.  HENT:  Head: Normocephalic and atraumatic.  Respiratory: Effort normal.  Neurological: He is alert and oriented to person, place, and time.  Psychiatric:  As above     ROS  Blood pressure 120/79, pulse 85, temperature 98.5 F (36.9 C), temperature source Oral, resp. rate 20, height 6' (1.829 m), weight 132.9 kg (293 lb), SpO2 97 %.Body mass index is 39.74 kg/m.  General Appearance: Not shaky, not sweaty, not confused. Not unsteady, normal conjugate eye movements. Not internally distressed. Appropriate behavior.   Eye Contact:  Good  Speech:  Clear and Coherent and Normal Rate  Volume:  Normal  Mood:  Depressed  Affect:  Blunted and mood congruent  Thought Process:  Linear  Orientation:  Full (Time, Place, and Person)  Thought Content:  Rumination  Suicidal Thoughts:  No  Homicidal Thoughts:  No  Memory:  Immediate;   Good Recent;   Fair Remote;   Good  Judgement:  Impaired  Insight:  Fair  Psychomotor Activity:  Decreased  Concentration:  Concentration: Fair and Attention Span: Fair  Recall:  Poor  Fund of Knowledge:  Fair  Language:  Good  Akathisia:  Negative  Handed:    AIMS (if indicated):     Assets:  Communication Skills Desire for Improvement Housing Physical Health Resilience  ADL's:  Intact  Cognition:  WNL   Sleep:  Number of Hours: 6      COGNITIVE FEATURES THAT CONTRIBUTE TO RISK:  None    SUICIDE RISK:   Minimal: No identifiable suicidal ideation.  Patients presenting with no risk factors but with morbid ruminations; may be classified as minimal risk based on the severity of the depressive symptoms  PLAN OF CARE:  1. Alcohol and opiate withdrawal protocol 2. Mirtazapine  15 mg HS.  3. Venlafaxine 37.5 mg daily. Would titrate gradually 4. Q 15 minute check for suicide. 5. Monitor mood, behavior and interaction with peers 6. SW would gather collateral from his family 7. SW would facilitate inpatient rehab placement   I certify that inpatient services furnished can reasonably be expected to improve the patient's condition.   Georgiann Cocker, MD 09/13/2017, 2:37 PM
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
## 2017-09-13 NOTE — Progress Notes (Signed)
D: Patient observed in dayroom socializing with roommate. Appears sleepy, sedated. Patient remains med focused. Patient's affect flat, mood anxious.  No pain, physical complaints voiced. VSS.  A: Medicated per orders. Patient returned stating the remeron "is not working. It hasn't touched me." Prn trazadone given for complaints of insomnia. Level III obs in place for safety. Emotional support offered.   R: Patient verbalizes understanding of POC. Patient denies SI/HI/AVH and remains safe on level III obs. Will continue to monitor closely.

## 2017-09-13 NOTE — [facility] Group Notes (Signed)
[facility] LCSW Group Therapy Note  Date/Time: 3/18/9, 1315  Type of Therapy and Topic:  Group Therapy:  Overcoming Obstacles  Participation Level:  moderate  Description of Group:    In this group patients will be encouraged to explore what they see as obstacles to their own wellness and recovery. They will be guided to discuss their thoughts, feelings, and behaviors related to these obstacles. The group will process together ways to cope with barriers, with attention given to specific choices patients can make. Each patient will be challenged to identify changes they are motivated to make in order to overcome their obstacles. This group will be process-oriented, with patients participating in exploration of their own experiences as well as giving and receiving support and challenge from other group members.  Therapeutic Goals: 1. Patient will identify personal and current obstacles as they relate to admission. 2. Patient will identify barriers that currently interfere with their wellness or overcoming obstacles.  3. Patient will identify feelings, thought process and behaviors related to these barriers. 4. Patient will identify two changes they are willing to make to overcome these obstacles:    Summary of Patient Progress: Pt identified depression and substance abuse issues as obstacles in his life.  Pt made several contributions to the group discussion but was not overly active or engaged.      Therapeutic Modalities:   Cognitive Behavioral Therapy Solution Focused Therapy Motivational Interviewing Relapse Prevention Therapy  Daleen Squibb, LCSW

## 2017-09-13 NOTE — Progress Notes (Signed)
D: Patient self inventory- Patient slept poor last night, requested sleep medications, said medications did not help. Appetite is fair, energy level is low, concentration is poor. Patient rates depression at a 7, hopelessness at a 7, and anxiety at a 9. Patient is withdrawing from drugs and alcohol, is experiencing tremors, diarrhea, cravings, agitation, runny nose, chilling, cramping, nausea, and irritability. Denies SI. No physical problems. Patient claims to have "all over pain" with the pain level of 9. Patient is taking pain medication, but has not been helpful. Patient's goal is "figuring out whats wrong with me, why I can't function in society." Patient said he will talk to the doctor to help achieve his goal. No discharge plan developed. Patient checked yes to "Is there anything that would keep you from following your discharge plan?" But did not explain what.  A: Medicated per orders. Emotional support offered. Patient keeps insisting we give him ativan, patient was educated that the medication is not available, but we have other meds to give him in replacement. Haldol, cogentin, and tylenol given. For anxiety, agitation, and body pain.  R: Patient denies SI/HI/AVH and will continue to monitor q 15 minutes.

## 2017-09-13 NOTE — Tx Team (Signed)
Interdisciplinary Treatment and Diagnostic Plan Update  09/13/2017 Time of Session: Intercourse MRN: [id number]  Principal Diagnosis: <principal problem not specified>  Secondary Diagnoses: Active Problems:   MDD (major depressive disorder), recurrent severe, without psychosis ([place])   Current Medications:  Current Facility-Administered Medications  Medication Dose Route Frequency Provider Last Rate Last Dose  . acetaminophen (TYLENOL) tablet 650 mg  650 mg Oral Q6H PRN Ethelene Hal, NP   650 mg at 09/13/17 0826  . alum & mag hydroxide-simeth (MAALOX/MYLANTA) 200-200-20 MG/5ML suspension 30 mL  30 mL Oral Q4H PRN Ethelene Hal, NP      . benztropine (COGENTIN) tablet 1 mg  1 mg Oral Q6H PRN Izediuno, Laruth Bouchard, MD       And  . haloperidol (HALDOL) tablet 5 mg  5 mg Oral Q6H PRN Izediuno, Laruth Bouchard, MD      . FLUoxetine (PROZAC) capsule 10 mg  10 mg Oral Daily Ethelene Hal, NP   10 mg at 09/13/17 0747  . gabapentin (NEURONTIN) capsule 200 mg  200 mg Oral BID Ethelene Hal, NP   200 mg at 09/13/17 0745  . hydrOXYzine (ATARAX/VISTARIL) tablet 25 mg  25 mg Oral Q6H PRN Lindon Romp A, NP   25 mg at 09/13/17 0827  . ibuprofen (ADVIL,MOTRIN) tablet 600 mg  600 mg Oral Q8H PRN Ethelene Hal, NP   600 mg at 09/13/17 0147  . loperamide (IMODIUM) capsule 2-4 mg  2-4 mg Oral PRN Lindon Romp A, NP      . LORazepam (ATIVAN) tablet 1 mg  1 mg Oral Q6H PRN Lindon Romp A, NP      . LORazepam (ATIVAN) tablet 1 mg  1 mg Oral QID Lindon Romp A, NP   1 mg at 09/13/17 0747   Followed by  . [START ON 09/14/2017] LORazepam (ATIVAN) tablet 1 mg  1 mg Oral TID Rozetta Nunnery, NP       Followed by  . [START ON 09/15/2017] LORazepam (ATIVAN) tablet 1 mg  1 mg Oral BID Rozetta Nunnery, NP       Followed by  . [START ON 09/16/2017] LORazepam (ATIVAN) tablet 1 mg  1 mg Oral Daily Lindon Romp A, NP      . magnesium hydroxide (MILK OF MAGNESIA) suspension 30 mL  30  mL Oral Daily PRN Ethelene Hal, NP      . multivitamin with minerals tablet 1 tablet  1 tablet Oral Daily Lindon Romp A, NP      . nicotine (NICODERM CQ - dosed in mg/24 hours) patch 21 mg  21 mg Transdermal Daily Ethelene Hal, NP   21 mg at 09/13/17 0747  . ondansetron (ZOFRAN-ODT) disintegrating tablet 4 mg  4 mg Oral Q6H PRN Rozetta Nunnery, NP      . Derrill Memo ON 09/14/2017] thiamine (VITAMIN B-1) tablet 100 mg  100 mg Oral Daily Lindon Romp A, NP      . traZODone (DESYREL) tablet 50 mg  50 mg Oral QHS PRN Ethelene Hal, NP   50 mg at 09/13/17 0149   PTA Medications: Medications Prior to Admission  Medication Sig Dispense Refill Last Dose  . hydrOXYzine (ATARAX/VISTARIL) 25 MG tablet Take 1 tablet (25 mg total) by mouth every 6 (six) hours as needed for anxiety (agitation). (Patient not taking: Reported on 09/11/2017) 45 tablet 0 Not Taking at Unknown time  . Multiple Vitamin (MULTIVITAMIN WITH MINERALS) TABS tablet Take 1  tablet by mouth daily. For nutrition supplementation (Patient not taking: Reported on 09/11/2017)   Not Taking at Unknown time  . zolpidem (AMBIEN) 5 MG tablet Take 1 tablet (5 mg total) by mouth at bedtime as needed for sleep. 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   Physician Treatment Plan for Primary Diagnosis: <principal problem not specified> Long Term Goal(s):     Short Term Goals:    Medication Management: Evaluate patient's response, side effects, and tolerance of medication regimen.  Therapeutic Interventions: 1 to 1 sessions, Unit Group sessions and Medication  administration.  Evaluation of Outcomes: Not Met  Physician Treatment Plan for Secondary Diagnosis: Active Problems:   MDD (major depressive disorder), recurrent severe, without psychosis ([place])  Long Term Goal(s):     Short Term Goals:       Medication Management: Evaluate patient's response, side effects, and tolerance of medication regimen.  Therapeutic Interventions: 1 to 1 sessions, Unit Group sessions and Medication administration.  Evaluation of Outcomes: Not Met   RN Treatment Plan for Primary Diagnosis: <principal problem not specified> Long Term Goal(s): Knowledge of disease and therapeutic regimen to maintain health will improve  Short Term Goals: Ability to remain free from injury will improve, Ability to verbalize frustration and anger appropriately will improve, Ability to demonstrate self-control, Ability to participate in decision making will improve, Ability to verbalize feelings will improve, Ability to disclose and discuss suicidal ideas, Ability to identify and develop effective coping behaviors will improve and Compliance with prescribed medications will improve  Medication Management: RN will administer medications as ordered by provider, will assess and evaluate patient's response and provide education to patient for prescribed medication. RN will report any adverse and/or side effects to prescribing provider.  Therapeutic Interventions: 1 on 1 counseling sessions, Psychoeducation, Medication administration, Evaluate responses to treatment, Monitor vital signs and CBGs as ordered, Perform/monitor CIWA, COWS, AIMS and Fall Risk screenings as ordered, Perform wound care treatments as ordered.  Evaluation of Outcomes: Not Met   LCSW Treatment Plan for Primary Diagnosis: <principal problem not specified> Long Term Goal(s): Safe transition to appropriate next level of care at discharge, Engage patient in therapeutic group addressing interpersonal concerns.  Short  Term Goals: Engage patient in aftercare planning with referrals and resources, Increase social support, Increase ability to appropriately verbalize feelings, Increase emotional regulation, Facilitate acceptance of mental health diagnosis and concerns, Facilitate patient progression through stages of change regarding substance use diagnoses and concerns, Identify triggers associated with mental health/substance abuse issues and Increase skills for wellness and recovery  Therapeutic Interventions: Assess for all discharge needs, 1 to 1 time with Social worker, Explore available resources and support systems, Assess for adequacy in community support network, Educate family and significant other(s) on suicide prevention, Complete Psychosocial Assessment, Interpersonal group therapy.  Evaluation of Outcomes: Not Met   Progress in Treatment: Attending groups: Yes.  Participating in groups: Yes. Taking medication as prescribed: Yes. Toleration medication: Yes. Family/Significant other contact made: No, will contact:  CSW will continue to assess Patient understands diagnosis: Yes. Discussing patient identified problems/goals with staff: Yes. Medical problems stabilized or resolved: Yes. Denies suicidal/homicidal ideation: Yes. Issues/concerns per patient self-inventory: No. Other:   New problem(s) identified: None  New Short Term/Long Term Goal(s):Detox, medication stabilization, elimination of SI thoughts, development of comprehensive mental wellness plan.   Patient Goals:  Discharge Plan or Barriers: CSW will continue to assess for an appropriate discharge plan.   Reason for Continuation of Hospitalization: Anxiety Depression Medication stabilization Suicidal ideation  Estimated Length of Stay: Thursday, 09/16/17  Attendees: Patient: Larry Adams 09/13/2017 8:49 AM  Physician: Dr. Leanord Hawking 09/13/2017 8:49 AM  Nursing: Nicoletta Dress. Viona Gilmore, RN 09/13/2017 8:49 AM  RN Care Manager: Rhunette Croft  09/13/2017 8:49 AM  Social Worker: Radonna Ricker, [place] 09/13/2017 8:49 AM  Recreational Therapist: Rhunette Croft 09/13/2017 8:49 AM  Other: X 09/13/2017 8:49 AM  Other: X 09/13/2017 8:49 AM  Other: Rhunette Croft 09/13/2017 8:49 AM    Scribe for Treatment Team: Marylee Floras, [place] 09/13/2017 8:49 AM

## 2017-09-13 NOTE — Progress Notes (Signed)
Recreation Therapy Notes  Date: 3.18.19 Time: 9:30 a.m. Location: [address] Dayroom  Group Topic: Stress Management  Goal Area(s) Addresses:  Goal 1.1: To reduce stress  -Patient will report feeling a reduction in stress level  -Patient will identify the importance of stress management  -Patient will participate during stress management group treatment    Intervention: Stress Management  Activity: Meditation- Patients listened to a guided stress management and relaxation voiceover. Patients were in a peaceful environment with soft lighting enhancing patients mood.   Education: Stress Management, Discharge Planning.   Education Outcome: Acknowledges edcuation/In group clarification offered/Needs additional education  Clinical Observations/Feedback:: Patient did not attend    Yashika Mask, Recreation Therapy Intern   Larry Adams 09/13/2017 12:41 PM 

## 2017-09-13 NOTE — Progress Notes (Signed)
Adult Psychoeducational Group Note  Date:  09/13/2017 Time:  4:15 PM  Group Topic/Focus:  Healthy Communication:   The focus of this group is to discuss communication, barriers to communication, as well as healthy ways to communicate with others.  Participation Level:  Active  Participation Quality:  Appropriate and Sharing  Affect:  Flat  Cognitive:  Alert and Appropriate  Insight: Improving  Engagement in Group:  Developing/Improving  Modes of Intervention:  Discussion and Support  Additional Comments:  Patient tossed a ball around to members of the group and answered questions. Patient identified importance of getting to know peers, themselves and build communication skills.  Chanya Chrisley A Jarmal Lewelling 09/13/2017, 4:45 PM 

## 2017-09-14 DIAGNOSIS — F1099 Alcohol use, unspecified with unspecified alcohol-induced disorder: Secondary | ICD-10-CM

## 2017-09-14 DIAGNOSIS — G4700 Insomnia, unspecified: Secondary | ICD-10-CM

## 2017-09-14 DIAGNOSIS — Y907 Blood alcohol level of 200-239 mg/100 ml: Secondary | ICD-10-CM

## 2017-09-14 DIAGNOSIS — F17210 Nicotine dependence, cigarettes, uncomplicated: Secondary | ICD-10-CM

## 2017-09-14 DIAGNOSIS — F419 Anxiety disorder, unspecified: Secondary | ICD-10-CM

## 2017-09-14 DIAGNOSIS — F1390 Sedative, hypnotic, or anxiolytic use, unspecified, uncomplicated: Secondary | ICD-10-CM

## 2017-09-14 MED ORDER — GABAPENTIN 300 MG PO CAPS
300.0000 mg | ORAL_CAPSULE | Freq: Three times a day (TID) | ORAL | Status: DC
  Administered 2017-09-14 – 2017-09-19 (×15): 300 mg via ORAL
  Filled 2017-09-14 (×9): qty 1
  Filled 2017-09-14 (×2): qty 42
  Filled 2017-09-14 (×6): qty 1
  Filled 2017-09-14: qty 42
  Filled 2017-09-14 (×5): qty 1

## 2017-09-14 MED ORDER — VENLAFAXINE HCL ER 75 MG PO CP24
75.0000 mg | ORAL_CAPSULE | Freq: Every day | ORAL | Status: DC
  Administered 2017-09-15 – 2017-09-19 (×5): 75 mg via ORAL
  Filled 2017-09-14: qty 14
  Filled 2017-09-14 (×7): qty 1

## 2017-09-14 MED ORDER — HYDROXYZINE HCL 25 MG PO TABS
25.0000 mg | ORAL_TABLET | Freq: Four times a day (QID) | ORAL | Status: AC | PRN
  Administered 2017-09-14 – 2017-09-15 (×2): 25 mg via ORAL
  Filled 2017-09-14: qty 1

## 2017-09-14 NOTE — [facility] Group Notes (Signed)
Adult Psychoeducational Group Note  Date:  09/14/2017 Time:  11:27 PM  Group Topic/Focus:  Wrap-Up Group:   The focus of this group is to help patients review their daily goal of treatment and discuss progress on daily workbooks.  Participation Level:  Active  Participation Quality:  Appropriate and Attentive  Affect:  Appropriate  Cognitive:  Alert and Appropriate  Insight: Appropriate and Good  Engagement in Group:  Engaged  Modes of Intervention:  Discussion and Education  Additional Comments:  Pt attended and participated in wrap up group this evening. Pt wasn't feeling well today, so they stayed in bed and laid down.   Chrisandra Netters 09/14/2017, 11:27 PM

## 2017-09-14 NOTE — BHH Suicide Risk Assessment (Addendum)
[facility] INPATIENT:  Family/Significant Other Suicide Prevention Education  Suicide Prevention Education:  Contact Attempts: Oswaldo Conroy, mother, [phone], has been identified by the patient as the family member/significant other with whom the patient will be residing, and identified as the person(s) who will aid the patient in the event of a mental health crisis.  With written consent from the patient, two attempts were made to provide suicide prevention education, prior to and/or following the patient's discharge.  We were unsuccessful in providing suicide prevention education.  A suicide education pamphlet was given to the patient to share with family/significant other.  Date and time of first attempt:09/14/17, 1530 Date and time of second attempt:09/15/17, 1315  Lorri Frederick, LCSW 09/14/2017, 3:29 PM

## 2017-09-14 NOTE — [facility] Group Notes (Signed)
Pt was invited but did not attend orientation/goals group. 

## 2017-09-14 NOTE — [facility] Group Notes (Signed)
LCSW Group Therapy Note 09/14/2017 11:43 AM  Type of Therapy/Topic: Group Therapy: Feelings about Diagnosis  Participation Level: Active   Description of Group:  This group will allow patients to explore their thoughts and feelings about diagnoses they have received. Patients will be guided to explore their level of understanding and acceptance of these diagnoses. Facilitator will encourage patients to process their thoughts and feelings about the reactions of others to their diagnosis and will guide patients in identifying ways to discuss their diagnosis with significant others in their lives. This group will be process-oriented, with patients participating in exploration of their own experiences, giving and receiving support, and processing challenge from other group members.  Therapeutic Goals: 1. Patient will demonstrate understanding of diagnosis as evidenced by identifying two or more symptoms of the disorder 2. Patient will be able to express two feelings regarding the diagnosis 3. Patient will demonstrate their ability to communicate their needs through discussion and/or role play  Summary of Patient Progress:  Larry Adams was engaged and participated throughout the group discussion. Larry Adams states that he initially felt "different" when he learned he had a mental illness. He reports that he has now accepted the fact that he has a mental illness and that it made him "unique". Larry Adams reports that his mental illness does affect who he is because when he is on medications, he feels "normal". Larry Adams reports that he plans to take the opinions from professionals more and involve these professionals in his care, in order for him to move forward on his road to recovery.   Therapeutic Modalities:  Cognitive Behavioral Therapy Brief Therapy Feelings Identification    Sabra Sessler Catalina Antigua Clinical Social Worker

## 2017-09-14 NOTE — Progress Notes (Addendum)
[facility] MD Progress Note  09/14/2017 2:37 PM Larry Adams  MRN:  [id number] Subjective:  Patient reports he is feeling " better", " less depressed ". At this time reports improved symptoms of WDL and does not currently describe significant withdrawal symptoms . Objective : I have discussed case with treatment team and have met with patient.  He is 32 year old male, who presented to ED voluntarily due to worsening depression, suicidal ideations with thoughts of overdosing . He states that his child's mother and child moved out of state recently which has been a trigger for increased depression. He also reports opiate dependence, and states he had been using heroin regularly prior to admission. He reports at variable intervals ,in binges  Admission BAL 218, Admission UDS negative . As per staff report patient has presented depressed, anxious, but visible in day room. Patient reports partially improved mood but remains somewhat depressed and briefly tearful when discussing above stressors . Currently presents alert, attentive,  denies suicidal ideations and is future oriented . At this time states he is interested in going to a rehab setting but states he would prefer to be able to spend a few days with his family following discharge and then go to rehab from there . Currently does not endorse symptoms of WDL- denies rhinorrhea, denies yawning , denies vomiting,nausea or diarrhea, denies cramps or aches. Also denies visual disturbances, does not present tremulous or restless, and vitals are stable . Denies medication side effects.     Principal Problem: MDD (major depressive disorder), recurrent severe, without psychosis ([place]) Diagnosis:   Patient Active Problem List   Diagnosis Date Noted  . Alcohol abuse with alcohol-induced mood disorder ([place]) [F10.14] 09/12/2017  . MDD (major depressive disorder), recurrent severe, without psychosis ([place]) [F33.2] 09/12/2017  . Benzodiazepine dependence ([place])  [F13.20] 07/23/2014  . Alcohol abuse [F10.10] 07/23/2014  . Substance induced mood disorder ([place]) [F19.94] 07/23/2014  . Substance-induced anxiety disorder [facility]) [F19.980] 07/23/2014   Total Time spent with patient: 20 minutes  Past Medical History:  Past Medical History:  Diagnosis Date  . Anxiety   . Back pain   . Mental disorder     Past Surgical History:  Procedure Laterality Date  . HERNIA REPAIR     Family History: History reviewed. No pertinent family history. Social History:  Social History   Substance and Sexual Activity  Alcohol Use Yes   Comment: pint of liquor a day; varies     Social History   Substance and Sexual Activity  Drug Use Yes  . Frequency: 5.0 times per week  . Types: Benzodiazepines, IV   Comment: reports that he is on klonopin and xanax; hx of marijuana use but drug screen presently negative    Social History   Socioeconomic History  . Marital status: Single    Spouse name: None  . Number of children: None  . Years of education: None  . Highest education level: None  Social Needs  . Financial resource strain: None  . Food insecurity - worry: None  . Food insecurity - inability: None  . Transportation needs - medical: None  . Transportation needs - non-medical: None  Occupational History  . None  Tobacco Use  . Smoking status: Current Every Day Smoker    Packs/day: 1.00    Types: Cigarettes  . Smokeless tobacco: Former Network engineer and Sexual Activity  . Alcohol use: Yes    Comment: pint of liquor a day; varies  . Drug  use: Yes    Frequency: 5.0 times per week    Types: Benzodiazepines, IV    Comment: reports that he is on klonopin and xanax; hx of marijuana use but drug screen presently negative  . Sexual activity: Yes  Other Topics Concern  . None  Social History Narrative  . None   Additional Social History:   Sleep: Fair  Appetite:  Fair  Current Medications: Current Facility-Administered Medications   Medication Dose Route Frequency Provider Last Rate Last Dose  . acetaminophen (TYLENOL) tablet 650 mg  650 mg Oral Q6H PRN Ethelene Hal, NP   650 mg at 09/13/17 1521  . alum & mag hydroxide-simeth (MAALOX/MYLANTA) 200-200-20 MG/5ML suspension 30 mL  30 mL Oral Q4H PRN Ethelene Hal, NP      . benztropine (COGENTIN) tablet 1 mg  1 mg Oral Q6H PRN Izediuno, Laruth Bouchard, MD   1 mg at 09/13/17 1520   And  . haloperidol (HALDOL) tablet 5 mg  5 mg Oral Q6H PRN Artist Beach, MD   5 mg at 09/13/17 1520  . chlordiazePOXIDE (LIBRIUM) capsule 25 mg  25 mg Oral QID PRN Money, Lowry Ram, FNP   25 mg at 09/13/17 1208  . gabapentin (NEURONTIN) capsule 200 mg  200 mg Oral TID Money, Lowry Ram, FNP   200 mg at 09/14/17 1211  . hydrOXYzine (ATARAX/VISTARIL) tablet 25 mg  25 mg Oral Q6H PRN Izediuno, Laruth Bouchard, MD      . ibuprofen (ADVIL,MOTRIN) tablet 600 mg  600 mg Oral Q8H PRN Ethelene Hal, NP   600 mg at 09/14/17 0449  . loperamide (IMODIUM) capsule 2-4 mg  2-4 mg Oral PRN Lindon Romp A, NP      . magnesium hydroxide (MILK OF MAGNESIA) suspension 30 mL  30 mL Oral Daily PRN Ethelene Hal, NP      . methocarbamol (ROBAXIN) tablet 500 mg  500 mg Oral Q6H PRN Money, Lowry Ram, FNP   500 mg at 09/13/17 1208  . mirtazapine (REMERON) tablet 15 mg  15 mg Oral QHS Money, Lowry Ram, FNP   15 mg at 09/13/17 2133  . multivitamin with minerals tablet 1 tablet  1 tablet Oral Daily Lindon Romp A, NP   1 tablet at 09/14/17 0742  . nicotine (NICODERM CQ - dosed in mg/24 hours) patch 21 mg  21 mg Transdermal Daily Ethelene Hal, NP   21 mg at 09/14/17 0741  . ondansetron (ZOFRAN-ODT) disintegrating tablet 4 mg  4 mg Oral Q6H PRN Lindon Romp A, NP      . thiamine (VITAMIN B-1) tablet 100 mg  100 mg Oral Daily Lindon Romp A, NP   100 mg at 09/14/17 0740  . traZODone (DESYREL) tablet 50 mg  50 mg Oral QHS PRN Ethelene Hal, NP   50 mg at 09/13/17 2213  . venlafaxine XR  (EFFEXOR-XR) 24 hr capsule 37.5 mg  37.5 mg Oral Q breakfast Money, Lowry Ram, FNP   37.5 mg at 09/14/17 0740    Lab Results: No results found for this or any previous visit (from the past 48 hour(s)).  Blood Alcohol level:  Lab Results  Component Value Date   ETH 218 (H) 09/11/2017   ETH 233 (H) 89/38/1017    Metabolic Disorder Labs: No results found for: HGBA1C, MPG No results found for: PROLACTIN No results found for: CHOL, TRIG, HDL, CHOLHDL, VLDL, LDLCALC  Physical Findings: AIMS: Facial and Oral Movements Muscles of  Facial Expression: None, normal Lips and Perioral Area: None, normal Jaw: None, normal Tongue: None, normal,Extremity Movements Upper (arms, wrists, hands, fingers): None, normal Lower (legs, knees, ankles, toes): None, normal, Trunk Movements Neck, shoulders, hips: None, normal, Overall Severity Severity of abnormal movements (highest score from questions above): None, normal Incapacitation due to abnormal movements: None, normal Patient's awareness of abnormal movements (rate only patient's report): No Awareness, Dental Status Current problems with teeth and/or dentures?: No Does patient usually wear dentures?: No  CIWA:  CIWA-Ar Total: 7 COWS:  COWS Total Score: 4  Musculoskeletal: Strength & Muscle Tone: within normal limits no tremors, no diaphoresis, no psychomotor agitation or restlessness at present Gait & Station: normal Patient leans: N/A  Psychiatric Specialty Exam: Physical Exam  ROS denies chest pain, denies shortness of breath, denies nausea or vomiting at this time  Blood pressure 113/73, pulse 71, temperature 98.2 F (36.8 C), temperature source Oral, resp. rate 18, height 6' (1.829 m), weight 132.9 kg (293 lb), SpO2 98 %.Body mass index is 39.74 kg/m.  General Appearance: Fairly Groomed  Eye Contact:  Good  Speech:  Normal Rate  Volume:  Decreased  Mood:  states he is feeling better than before admission  Affect:  vaguely  constricted, briefly tearful when discussing son being out of state with mother, smiles at times appropriately   Thought Process:  Linear and Descriptions of Associations: Intact  Orientation:  Other:  fully alert and attentive   Thought Content:  denies hallucinations, no delusions, not internally preoccupied   Suicidal Thoughts:  No currently denies suicidal or self injurious ideations , contracts for safety on unit at this time, he denies HI and also specifically denies any homicidal ideations towards mother of his child   Homicidal Thoughts:  No  Memory:  recent and remote grossly intact   Judgement:  Fair- improving   Insight:  Fair  Psychomotor Activity:  Normal- no current restlessness or agitation  Concentration:  Concentration: Good and Attention Span: Good  Recall:  Good  Fund of Knowledge:  Good  Language:  Good  Akathisia:  Negative  Handed:  Right  AIMS (if indicated):     Assets:  Communication Skills Desire for Improvement Resilience  ADL's:  Intact  Cognition:  WNL  Sleep:  Number of Hours: 5.25   Assessment - patient is a 32 year old male who presented to ED voluntarily due to depression, SI, opiate and alcohol abuse. Reports ongoing depression and affect remains labile, but states he is feeling better than on admission and denies SI at this time. Currently not endorsing or presenting with opiate /ETOH WDL and does not present restless or in any acute distress . Denies medication side effects thus far. We discussed checking Hep B/C /HIV due to history of IVDA, but currently declines .   Treatment Plan Summary: Daily contact with patient to assess and evaluate symptoms and progress in treatment, Medication management, Plan inpatient treatment  and medications as below  Encourage group and milieu participation to work on coping skills and symptom reduction Encourage efforts to work on sobriety and relapse prevention efforts  Continue Neurontin 300 mgrs TID for  anxiety, pain, alcohol use disorder Increase Effexor XR to 75 mgrs QDAY for depression, anxiety Continue Remeron 15 mgrs QHS for depression, anxiety, insomnia Continue Trazodone 50 mgrs QHS PRN for insomnia if needed   Continue Librium PRNs for potential alcohol WDL symptoms  Treatment team working on disposition planning options   [employer],  MD 09/14/2017, 2:37 PM

## 2017-09-15 DIAGNOSIS — M549 Dorsalgia, unspecified: Secondary | ICD-10-CM

## 2017-09-15 MED ORDER — TRAZODONE HCL 50 MG PO TABS
25.0000 mg | ORAL_TABLET | Freq: Every evening | ORAL | Status: DC | PRN
  Administered 2017-09-15 – 2017-09-17 (×3): 25 mg via ORAL
  Filled 2017-09-15 (×3): qty 1

## 2017-09-15 NOTE — Progress Notes (Signed)
D: Pt denies SI/HI/AVH. Pt is kept to himself , pt stated he was ready to leave.    A: Pt was offered support and encouragement. Pt was given scheduled medications. Pt was encourage to attend groups. Q 15 minute checks were done for safety.   R:Pt attends groups and interacts well with peers and staff. Pt is taking medication. Pt has no complaints.Pt receptive to treatment and safety maintained on unit.

## 2017-09-15 NOTE — Plan of Care (Signed)
  Safety: Periods of time without injury will increase 09/15/2017 0104 - Progressing by Delos Haring, RN Note Pt safe on the unit at this time

## 2017-09-15 NOTE — Progress Notes (Signed)
D: Pt denies SI/HI/AVH. Pt is pleasant and cooperative. Pt stayed in his room a lot of the evening, pt appeared very concerned about his medications  A: Pt was offered support and encouragement. Pt was given scheduled medications. Pt was encourage to attend groups. Q 15 minute checks were done for safety.  R:Pt attends groups and interacts well with peers and staff. Pt is taking medication. Pt has no complaints.Pt receptive to treatment and safety maintained on unit.

## 2017-09-15 NOTE — Progress Notes (Signed)
Recreation Therapy Notes  Date:3.20.19 Time:9:30 a.m. Location: 300 [address] Dayroom  Group Topic: Stress Management  Goal Area(s) Addresses:  Goal 1.1: To reduce stress  -Patient will report feeling a reduction in stress level  -Patient will identify the importance of stress management  -Patient will participate during stress management group treatment    Intervention: Stress Management  Activity: Guided Imagery- Patients were in a peaceful environment with soft lighting enhancing patients mood. Patients were read a guided imagery script to promote relaxation   Education:Stress Management, Discharge Planning.   Education Outcome:Acknowledges edcuation/In group clarification offered/Needs additional education  Clinical Observations/Feedback::Patient did not attend   Cleotilde Spadaccini, Recreation Therapy Intern   Larry Adams 09/15/2017 11:21 AM 

## 2017-09-15 NOTE — Plan of Care (Signed)
  Safety: Periods of time without injury will increase 09/15/2017 2324 - Progressing by Delos Haring, RN Note Pt safe on the unit at this time

## 2017-09-15 NOTE — Progress Notes (Addendum)
[facility] MD Progress Note  09/15/2017 1:52 PM Larry Adams  MRN:  [id number]   Subjective:  Patient reports that he is feeling better today. He feels the medication is working good and he denies any side effects. He denies any SI/HI/AVH and contracts for safety. He reports his depression at 0 and his anxiety at 0. His only complaint is back pain form the bed.   Objective: Patient's chat and findings reviewed and discussed with treatment team. Patient presents in his room lying in bed and stated that he just felt like staying out of group today. He has been attending some groups and has been interacting with peers and staff appropriately. Good sleep has been reported and he has been going to eat regularly. He has not been scoring on the CIWA and has not had any Librium since 09/13/17 at 1208, so will discontinue the Librium. The plan is for him to go to [facility].     Principal Problem: MDD (major depressive disorder), recurrent severe, without psychosis ([facility]) Diagnosis:   Patient Active Problem List   Diagnosis Date Noted  . Alcohol abuse with alcohol-induced mood disorder ([facility]) [F10.14] 09/12/2017  . MDD (major depressive disorder), recurrent severe, without psychosis ([facility]) [F33.2] 09/12/2017  . Benzodiazepine dependence ([facility]) [F13.20] 07/23/2014  . Alcohol abuse [F10.10] 07/23/2014  . Substance induced mood disorder ([facility]) [F19.94] 07/23/2014  . Substance-induced anxiety disorder [facility]) [F19.980] 07/23/2014   Total Time spent with patient: 15 minutes  Past Psychiatric History: See H&P  Past Medical History:  Past Medical History:  Diagnosis Date  . Anxiety   . Back pain   . Mental disorder     Past Surgical History:  Procedure Laterality Date  . HERNIA REPAIR     Family History: History reviewed. No pertinent family history. Family Psychiatric  History: See H&P Social History:  Social History   Substance and Sexual Activity  Alcohol Use Yes   Comment: pint of liquor a  day; varies     Social History   Substance and Sexual Activity  Drug Use Yes  . Frequency: 5.0 times per week  . Types: Benzodiazepines, IV   Comment: reports that he is on klonopin and xanax; hx of marijuana use but drug screen presently negative    Social History   Socioeconomic History  . Marital status: Single    Spouse name: None  . Number of children: None  . Years of education: None  . Highest education level: None  Social Needs  . Financial resource strain: None  . Food insecurity - worry: None  . Food insecurity - inability: None  . Transportation needs - medical: None  . Transportation needs - non-medical: None  Occupational History  . None  Tobacco Use  . Smoking status: Current Every Day Smoker    Packs/day: 1.00    Types: Cigarettes  . Smokeless tobacco: Former Engineer, water and Sexual Activity  . Alcohol use: Yes    Comment: pint of liquor a day; varies  . Drug use: Yes    Frequency: 5.0 times per week    Types: Benzodiazepines, IV    Comment: reports that he is on klonopin and xanax; hx of marijuana use but drug screen presently negative  . Sexual activity: Yes  Other Topics Concern  . None  Social History Narrative  . None   Additional Social History:  Sleep: Good  Appetite:  Good  Current Medications: Current Facility-Administered Medications  Medication Dose Route Frequency Provider Last Rate Last Dose  . acetaminophen (TYLENOL) tablet 650 mg  650 mg Oral Q6H PRN Laveda Abbe, NP   650 mg at 09/13/17 1521  . alum & mag hydroxide-simeth (MAALOX/MYLANTA) 200-200-20 MG/5ML suspension 30 mL  30 mL Oral Q4H PRN Laveda Abbe, NP      . benztropine (COGENTIN) tablet 1 mg  1 mg Oral Q6H PRN Izediuno, Delight Ovens, MD   1 mg at 09/13/17 1520   And  . haloperidol (HALDOL) tablet 5 mg  5 mg Oral Q6H PRN Georgiann Cocker, MD   5 mg at 09/13/17 1520  . gabapentin (NEURONTIN) capsule 300 mg  300 mg  Oral TID Cobos, Rockey Situ, MD   300 mg at 09/15/17 1205  . hydrOXYzine (ATARAX/VISTARIL) tablet 25 mg  25 mg Oral Q6H PRN Georgiann Cocker, MD   25 mg at 09/14/17 2305  . ibuprofen (ADVIL,MOTRIN) tablet 600 mg  600 mg Oral Q8H PRN Laveda Abbe, NP   600 mg at 09/14/17 0449  . loperamide (IMODIUM) capsule 2-4 mg  2-4 mg Oral PRN Nira Conn A, NP      . magnesium hydroxide (MILK OF MAGNESIA) suspension 30 mL  30 mL Oral Daily PRN Laveda Abbe, NP      . methocarbamol (ROBAXIN) tablet 500 mg  500 mg Oral Q6H PRN Money, Gerlene Burdock, FNP   500 mg at 09/13/17 1208  . mirtazapine (REMERON) tablet 15 mg  15 mg Oral QHS Money, Gerlene Burdock, FNP   15 mg at 09/14/17 2305  . multivitamin with minerals tablet 1 tablet  1 tablet Oral Daily Nira Conn A, NP   1 tablet at 09/15/17 [phone]  . nicotine (NICODERM CQ - dosed in mg/24 hours) patch 21 mg  21 mg Transdermal Daily Laveda Abbe, NP   21 mg at 09/15/17 9604  . ondansetron (ZOFRAN-ODT) disintegrating tablet 4 mg  4 mg Oral Q6H PRN Nira Conn A, NP      . thiamine (VITAMIN B-1) tablet 100 mg  100 mg Oral Daily Nira Conn A, NP   100 mg at 09/15/17 0938  . traZODone (DESYREL) tablet 50 mg  50 mg Oral QHS PRN Laveda Abbe, NP   50 mg at 09/14/17 2305  . venlafaxine XR (EFFEXOR-XR) 24 hr capsule 75 mg  75 mg Oral Q breakfast Cobos, Rockey Situ, MD   75 mg at 09/15/17 5409    Lab Results: No results found for this or any previous visit (from the past 48 hour(s)).  Blood Alcohol level:  Lab Results  Component Value Date   ETH 218 (H) 09/11/2017   ETH 233 (H) 07/23/2014    Metabolic Disorder Labs: No results found for: HGBA1C, MPG No results found for: PROLACTIN No results found for: CHOL, TRIG, HDL, CHOLHDL, VLDL, LDLCALC  Physical Findings: AIMS: Facial and Oral Movements Muscles of Facial Expression: None, normal Lips and Perioral Area: None, normal Jaw: None, normal Tongue: None, normal,Extremity  Movements Upper (arms, wrists, hands, fingers): None, normal Lower (legs, knees, ankles, toes): None, normal, Trunk Movements Neck, shoulders, hips: None, normal, Overall Severity Severity of abnormal movements (highest score from questions above): None, normal Incapacitation due to abnormal movements: None, normal Patient's awareness of abnormal movements (rate only patient's report): No Awareness, Dental Status Current problems with teeth and/or dentures?: No Does patient usually wear dentures?: No  CIWA:  CIWA-Ar Total: 0 COWS:  COWS Total Score: 4  Musculoskeletal: Strength & Muscle Tone: within normal limits Gait & Station: normal Patient leans: N/A  Psychiatric Specialty Exam: Physical Exam  Nursing note and vitals reviewed. Constitutional: He is oriented to person, place, and time. He appears well-developed and well-nourished.  Cardiovascular: Normal rate.  Respiratory: Effort normal.  Musculoskeletal: Normal range of motion.  Neurological: He is alert and oriented to person, place, and time.  Skin: Skin is warm.    Review of Systems  Constitutional: Negative.   HENT: Negative.   Eyes: Negative.   Respiratory: Negative.   Cardiovascular: Negative.   Gastrointestinal: Negative.   Genitourinary: Negative.   Musculoskeletal: Positive for back pain.  Skin: Negative.   Neurological: Negative.   Endo/Heme/Allergies: Negative.   Psychiatric/Behavioral: Negative.     Blood pressure 127/87, pulse 85, temperature 98 F (36.7 C), temperature source Oral, resp. rate 16, height 6' (1.829 m), weight 132.9 kg (293 lb), SpO2 98 %.Body mass index is 39.74 kg/m.  General Appearance: Casual  Eye Contact:  Good  Speech:  Clear and Coherent and Normal Rate  Volume:  Normal  Mood:  Euthymic  Affect:  Congruent  Thought Process:  Goal Directed and Descriptions of Associations: Intact  Orientation:  Full (Time, Place, and Person)  Thought Content:  WDL  Suicidal Thoughts:  No   Homicidal Thoughts:  No  Memory:  Immediate;   Good Recent;   Good Remote;   Good  Judgement:  Good  Insight:  Good  Psychomotor Activity:  Normal  Concentration:  Concentration: Good and Attention Span: Good  Recall:  Good  Fund of Knowledge:  Good  Language:  Good  Akathisia:  No  Handed:  Right  AIMS (if indicated):     Assets:  Communication Skills Desire for Improvement Financial Resources/Insurance Housing Physical Health Social Support Transportation  ADL's:  Intact  Cognition:  WNL  Sleep:  Number of Hours: 5.75   Problems Addressed: MDD severe  Treatment Plan Summary: Daily contact with patient to assess and evaluate symptoms and progress in treatment, Medication management and Plan is to:  -Continue Effexor-XR 75 mg PO Daily for mood stability Continue Remeron 15 mg PO QHS for mood stability -Continue Neurontin 300 mg PO TID for agitation/anxiety -Continue Vistaril 25 mg PO Q6H PRN for anxiety -Continue Trazodone 50 mg PO QHS PRN for insomnia -Stop Librium -Encourage group therapy aprticipation  Maryfrances Bunnell, FNP 09/15/2017, 1:52 PM   Patient seen, Suicide Assessment Completed.  Disposition Plan Reviewed

## 2017-09-15 NOTE — [facility] Group Notes (Signed)
[facility] Group Therapy 09/15/2017 1:15pm  Type of Therapy: [facility] Presentation  Participation Level: Invited. Chose to remain in bed. DID NOT ATTEND.   Summary of Progress/Problems: Mental [facility]) Speaker came to talk about his personal journey with mental health. The pt processed ways by which to relate to the speaker. [facility] speaker provided handouts and educational information pertaining to groups and services offered by the [facility]. Pt was engaged in speaker's presentation and was receptive to resources provided.    [employer], LCSW 09/15/2017 9:16 AM

## 2017-09-15 NOTE — Progress Notes (Signed)
Nursing Progress Note [phone]  Data: Patient presents with flat affect and depressed mood. Patient was late to wake up this morning and took his medications late. Patient reports he slept "horrible" and is minimal during interaction with Clinical research associate. Patient compliant with scheduled medications. Patient appears free of withdrawal symptoms. Patient denies SI/HI/AVH or pain. Patient contracts for safety on the unit at this time. Patient declined to complete self-inventory sheet thus far today.  Action: Patient educated about and provided medication per provider's orders. Patient safety maintained with q15 min safety checks. Low fall risk precautions in place. Emotional support given. 1:1 interaction and active listening provided. Patient encouraged to attend meals and groups. Labs, vital signs and patient behavior monitored throughout shift. Patient encouraged to work on treatment plan and goals.  Response: Patient receptive to interaction with nurse. Patient remains safe on the unit at this time. Patient is minimally interactive in the milieu and did not attend groups. Will continue to support and monitor.

## 2017-09-16 NOTE — Progress Notes (Signed)
Adult Psychoeducational Group Note  Date:  09/16/2017 Time:  4:00 PM  Group Topic/Focus: Crisis Management: Coping skills Crisis Planning:   The purpose of this group is to help patients create coping skills for use upon discharge or in the future, as needed.  Participation Level:  Minimal  Participation Quality:  Poor  Affect:  Blunted  Cognitive:  Alert and Oriented  Insight: Lacking  Engagement in Group:  Lacking  Modes of Intervention:  Discussion and Education  Additional Comments: Patient did not contribute to the discussion of coping skills but did attend group for the entire duration.  Rae Lips Cadi Rhinehart 09/16/2017, 5:17 PM

## 2017-09-16 NOTE — Progress Notes (Signed)
D: Pt was in the dayroom upon initial approach.  Pt presents with depressed affect and mood.  He reports his day was "alright" and he "made more of an effort to get up and go to groups."  He reports he learned more about coping skills today and reported "taking a drive" and "listening to music" as two of his positive coping skills. Pt denies SI/HI, denies hallucinations, reports chronic back pain of 5/10.  Pt has been visible in milieu interacting with peers and staff appropriately.  Pt attended evening group.    A: Introduced self to pt.  Actively listened to pt and offered support and encouragement. Medications administered per order.  PRN medication administered for pain and sleep.  Heat packs provided for pain.  Q15 minute safety checks maintained.  R: Pt is safe on the unit.  Pt is compliant with medications.  Pt verbally contracts for safety.  Will continue to monitor and assess.

## 2017-09-16 NOTE — [facility] Group Notes (Signed)
[facility] LCSW Group Therapy Note  Date/Time  09/16/17 1:15pm  Type of Therapy/Topic:  Group Therapy:  Balance in Life  Participation Level:  Minimal  Description of Group:    This group will address the concept of balance and how it feels and looks when one is unbalanced. Patients will be encouraged to process areas in their lives that are out of balance, and identify reasons for remaining unbalanced. Facilitators will guide patients utilizing problem- solving interventions to address and correct the stressor making their life unbalanced. Understanding and applying boundaries will be explored and addressed for obtaining  and maintaining a balanced life. Patients will be encouraged to explore ways to assertively make their unbalanced needs known to significant others in their lives, using other group members and facilitator for support and feedback.  Therapeutic Goals: 1. Patient will identify two or more emotions or situations they have that consume much of in their lives. 2. Patient will identify signs/triggers that life has become out of balance:  3. Patient will identify two ways to set boundaries in order to achieve balance in their lives:  4. Patient will demonstrate ability to communicate their needs through discussion and/or role plays  Summary of Patient Progress: Patient appropriately participated in group but did not contribute to the general discussion. Patient identified financial stressors and family relationships as the most imbalanced aspect of his life.   Therapeutic Modalities:   Cognitive Behavioral Therapy Solution-Focused Therapy Assertiveness Training

## 2017-09-16 NOTE — Progress Notes (Addendum)
Nursing Progress Note [phone]  Data: Patient presents with flat affect and depressed mood. Patient took morning medications but did not get up this morning until 1000. Patient states goal for today is to discharge. Patient denies SI/HI/AVH. Patient contracts for safety on the unit at this time. Patient provided but declined to complete their self-inventory sheet.  Patient has been isolative to his room and is not attending groups. When called for medications, patient does not get up when asked. Patient observed sleeping in his bed throughout the day.  Action: Patient educated about and provided medication per provider's orders. Patient safety maintained with q15 min safety checks. Low fall risk precautions in place. Emotional support given. 1:1 interaction and active listening provided. Patient encouraged to attend meals and groups. Labs, vital signs and patient behavior monitored throughout shift. Patient encouraged to work on treatment plan and goals.  Response: Patient receptive to interaction with nurse. Patient remains safe on the unit at this time. Patient is observed resting in his bed in no distress. Will continue to support and monitor.

## 2017-09-16 NOTE — Progress Notes (Signed)
Nutrition Education Note  Pt attended group focusing on general, healthful nutrition education.  RD emphasized the importance of eating regular meals and snacks throughout the day. Consuming sugar-free beverages and incorporating fruits and vegetables into diet when possible. Provided examples of healthy snacks. Patient encouraged to leave group with a goal to improve nutrition/healthy eating.   Diet Order: Diet regular Room service appropriate? No; Fluid consistency: Thin Pt is also offered choice of unit snacks mid-morning and mid-afternoon.  Pt is eating as desired.   If additional nutrition issues arise, please consult RD.     Larry Shuttleworth, MS, RD, LDN, CNSC Inpatient Clinical Dietitian Pager # [phone] After hours/weekend pager # [phone]     

## 2017-09-16 NOTE — Progress Notes (Addendum)
Adult Psychoeducational Group Note  Date:  09/16/2017 Time:  9:49 PM  Group Topic/Focus:  Wrap-Up Group:   The focus of this group is to help patients review their daily goal of treatment and discuss progress on daily workbooks.  Participation Level:  Active  Participation Quality:  Appropriate  Affect:  Appropriate  Cognitive:  Alert and Oriented  Insight: Good  Engagement in Group:  Engaged  Modes of Intervention:  Discussion  Additional Comments:  Pt rated his day a 8/10. Pt's goal was to get out of bed, which he did. Pt's meds were adjusted, which he believes has helped. Pt is going home tomorrow.   Leo Grosser 09/16/2017, 9:49 PM

## 2017-09-16 NOTE — Plan of Care (Signed)
  Problem: Safety: Goal: Periods of time without injury will increase Outcome: Progressing   Problem: Self-Concept: Goal: Level of anxiety will decrease Outcome: Progressing   Comments: Patient denies anxiety today. Patient contracts for safety on the unit.

## 2017-09-16 NOTE — Progress Notes (Signed)
[facility] MD Progress Note  09/16/2017 2:58 PM Deepak Bless  MRN:  [id number]   Subjective: patient reports partial improvement and acknowledges his mood is better than before admission. As he improves he is more focused on disposition planning, but ruminative about mother's availability to pick him up and spend time with him. States his plan is to go to mother's for a period of a few days and then go to [facility] starting early next week. States he does continue to have cravings , but feels mother will provide support for him to remain sober and focused on abstinence while he gets into rehab . Denies medication side effects. Denies suicidal ideations.    Objective:  I have met with patient and  discussed case with treatment team. Patient presents with partially improving mood , states he feels better, and affect is more reactive, although presents vaguely anxious, ruminative about above . Denies suicidal ideations. No current medication side effects. Does not endorse or present with symptoms of withdrawal. Vitals are stable. More focused on disposition planning, acknowledges history of cravings , but states he feels more comfortable and confident due to spending time with mother after discharge in lieu of going to rehab. Ruminative about what mother's availability may be . No disruptive or agitated behaviors on unit, going to some groups , but staff reports he tends to isolate, with limited milieu participation.   Principal Problem: MDD (major depressive disorder), recurrent severe, without psychosis ([place]) Diagnosis:   Patient Active Problem List   Diagnosis Date Noted  . Alcohol abuse with alcohol-induced mood disorder ([place]) [F10.14] 09/12/2017  . MDD (major depressive disorder), recurrent severe, without psychosis ([place]) [F33.2] 09/12/2017  . Benzodiazepine dependence ([place]) [F13.20] 07/23/2014  . Alcohol abuse [F10.10] 07/23/2014  . Substance induced mood disorder ([place]) [F19.94]  07/23/2014  . 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 Anxiety   . Back pain   . Mental disorder     Past Surgical History:  Procedure Laterality Date  . HERNIA REPAIR     Family History: History reviewed. No pertinent family history. Family Psychiatric  History: See H&P Social History:  Social History   Substance and Sexual Activity  Alcohol Use Yes   Comment: pint of liquor a day; varies     Social History   Substance and Sexual Activity  Drug Use Yes  . Frequency: 5.0 times per week  . Types: Benzodiazepines, IV   Comment: reports that he is on klonopin and xanax; hx of marijuana use but drug screen presently negative    Social History   Socioeconomic History  . Marital status: Single    Spouse name: Not on file  . Number of children: Not on file  . Years of education: Not on file  . Highest education level: Not on file  Occupational History  . Not on file  Social Needs  . Financial resource strain: Not on file  . Food insecurity:    Worry: Not on file    Inability: Not on file  . Transportation needs:    Medical: Not on file    Non-medical: Not on file  Tobacco Use  . Smoking status: Current Every Day Smoker    Packs/day: 1.00    Types: Cigarettes  . Smokeless tobacco: Former Network engineer and Sexual Activity  . Alcohol use:  Yes    Comment: pint of liquor a day; varies  . Drug use: Yes    Frequency: 5.0 times per week    Types: Benzodiazepines, IV    Comment: reports that he is on klonopin and xanax; hx of marijuana use but drug screen presently negative  . Sexual activity: Yes  Lifestyle  . Physical activity:    Days per week: Not on file    Minutes per session: Not on file  . Stress: Not on file  Relationships  . Social connections:    Talks on phone: Not on file    Gets  together: Not on file    Attends religious service: Not on file    Active member of club or organization: Not on file    Attends meetings of clubs or organizations: Not on file    Relationship status: Not on file  Other Topics Concern  . Not on file  Social History Narrative  . Not on file   Additional Social History:   Sleep: improving  Appetite:  Good  Current Medications: Current Facility-Administered Medications  Medication Dose Route Frequency Provider Last Rate Last Dose  . acetaminophen (TYLENOL) tablet 650 mg  650 mg Oral Q6H PRN Ethelene Hal, NP   650 mg at 09/16/17 1010  . alum & mag hydroxide-simeth (MAALOX/MYLANTA) 200-200-20 MG/5ML suspension 30 mL  30 mL Oral Q4H PRN Ethelene Hal, NP      . benztropine (COGENTIN) tablet 1 mg  1 mg Oral Q6H PRN Izediuno, Laruth Bouchard, MD   1 mg at 09/13/17 1520   And  . haloperidol (HALDOL) tablet 5 mg  5 mg Oral Q6H PRN Artist Beach, MD   5 mg at 09/13/17 1520  . gabapentin (NEURONTIN) capsule 300 mg  300 mg Oral TID Sorren Vallier, Myer Peer, MD   300 mg at 09/16/17 1435  . ibuprofen (ADVIL,MOTRIN) tablet 600 mg  600 mg Oral Q8H PRN Ethelene Hal, NP   600 mg at 09/15/17 2206  . magnesium hydroxide (MILK OF MAGNESIA) suspension 30 mL  30 mL Oral Daily PRN Ethelene Hal, NP      . methocarbamol (ROBAXIN) tablet 500 mg  500 mg Oral Q6H PRN Money, Lowry Ram, FNP   500 mg at 09/13/17 1208  . mirtazapine (REMERON) tablet 15 mg  15 mg Oral QHS Money, Lowry Ram, FNP   15 mg at 09/15/17 2204  . multivitamin with minerals tablet 1 tablet  1 tablet Oral Daily Lindon Romp A, NP   1 tablet at 09/16/17 1008  . nicotine (NICODERM CQ - dosed in mg/24 hours) patch 21 mg  21 mg Transdermal Daily Ethelene Hal, NP   21 mg at 09/16/17 1011  . thiamine (VITAMIN B-1) tablet 100 mg  100 mg Oral Daily Lindon Romp A, NP   100 mg at 09/16/17 1008  . traZODone (DESYREL) tablet 25 mg  25 mg Oral QHS PRN Sherrie Marsan, Myer Peer, MD    25 mg at 09/15/17 2204  . venlafaxine XR (EFFEXOR-XR) 24 hr capsule 75 mg  75 mg Oral Q breakfast Keslee Harrington, Myer Peer, MD   75 mg at 09/16/17 1008    Lab Results: No results found for this or any previous visit (from the past 48 hour(s)).  Blood Alcohol level:  Lab Results  Component Value Date   ETH 218 (H) 09/11/2017   ETH 233 (H) 56/25/6389    Metabolic Disorder Labs: No results found for: HGBA1C, MPG  No results found for: PROLACTIN No results found for: CHOL, TRIG, HDL, CHOLHDL, VLDL, LDLCALC  Physical Findings: AIMS: Facial and Oral Movements Muscles of Facial Expression: None, normal Lips and Perioral Area: None, normal Jaw: None, normal Tongue: None, normal,Extremity Movements Upper (arms, wrists, hands, fingers): None, normal Lower (legs, knees, ankles, toes): None, normal, Trunk Movements Neck, shoulders, hips: None, normal, Overall Severity Severity of abnormal movements (highest score from questions above): None, normal Incapacitation due to abnormal movements: None, normal Patient's awareness of abnormal movements (rate only patient's report): No Awareness, Dental Status Current problems with teeth and/or dentures?: No Does patient usually wear dentures?: No  CIWA:  CIWA-Ar Total: 0 COWS:  COWS Total Score: 4  Musculoskeletal: Strength & Muscle Tone: within normal limits Gait & Station: normal Patient leans: N/A  Psychiatric Specialty Exam: Physical Exam  Nursing note and vitals reviewed. Constitutional: He is oriented to person, place, and time. He appears well-developed and well-nourished.  Cardiovascular: Normal rate.  Respiratory: Effort normal.  Musculoskeletal: Normal range of motion.  Neurological: He is alert and oriented to person, place, and time.  Skin: Skin is warm.    Review of Systems  Constitutional: Negative.   HENT: Negative.   Eyes: Negative.   Respiratory: Negative.   Cardiovascular: Negative.   Gastrointestinal: Negative.    Genitourinary: Negative.   Musculoskeletal: Positive for back pain.  Skin: Negative.   Neurological: Negative.   Endo/Heme/Allergies: Negative.   Psychiatric/Behavioral: Negative.   denies chest pain, denies shortness of breath  Blood pressure 121/76, pulse 89, temperature 98 F (36.7 C), temperature source Oral, resp. rate 16, height 6' (1.829 m), weight 132.9 kg (293 lb), SpO2 98 %.Body mass index is 39.74 kg/m.  General Appearance: Fairly Groomed  Eye Contact:  Good  Speech:  Normal Rate  Volume:  Normal  Mood:  partially improved, remains vaguely anxious  Affect:  anxious, but reactive  Thought Process:  Linear and Descriptions of Associations: Intact  Orientation:  Other:  fully alert and attentive   Thought Content:  denies hallucinations, no delusions, not internally preoccupied   Suicidal Thoughts:  No currently denies suicidal or self injurious ideations, denies homicidal or violent ideations   Homicidal Thoughts:  No  Memory:  recent and remote grossly intact   Judgement:  Fair- improving   Insight:  Fair- improving   Psychomotor Activity:  no psychomotor agitation or restlessness   Concentration:  Concentration: Good and Attention Span: Good  Recall:  Good  Fund of Knowledge:  Good  Language:  Good  Akathisia:  No  Handed:  Right  AIMS (if indicated):     Assets:  Communication Skills Desire for Improvement Financial Resources/Insurance Housing Physical Health Social Support Transportation  ADL's:  Intact  Cognition:  WNL  Sleep:  Number of Hours: 6   Assessment -  Patient reports improvement compared to admission and presents with improving mood and range of affect. No current residual WDL symptoms and vitals stable. No SI. Tends to ruminate about disposition planning , he is wanting to go to stay with mother until early next week and then to a rehab, but worries about mother's availability to pick him up and provide support . Currently tolerating medications  well .   Treatment Plan Summary: Daily contact with patient to assess and evaluate symptoms and progress in treatment, Medication management and Plan is to:  -Treatment Plan reviewed as below today 3/21 -Encourage group and milieu participation to work on coping skills and symptom reduction -  Encourage efforts to work on sobriety and relapse prevention -Continue Effexor-XR 75 mg PO Daily depression -Continue Remeron 15 mg PO QHS for depression, anxiety, insomnia  -Continue Neurontin 300 mg PO TID for agitation/anxiety -Continue Vistaril 25 mg PO Q6H PRN for anxiety -Continue Trazodone 50 mg PO QHS PRN for insomnia   Jenne Campus, MD 09/16/2017, 2:58 PM  Patient ID: Larry Adams, male   DOB: Nov 18, 1985, 32 y.o.   MRN: [id number]

## 2017-09-16 NOTE — Plan of Care (Signed)
  Problem: Safety: Goal: Ability to remain free from injury will improve Outcome: Progressing Note: Pt has not harmed self or others tonight.  He denies SI/HI and verbally contracts for safety.    

## 2017-09-16 NOTE — Progress Notes (Signed)
Pt attend wrap up group. His day was a 4. Pt said he is tired due to his medication and he need to get his medication change.

## 2017-09-17 MED ORDER — GABAPENTIN 300 MG PO CAPS: 300.0000 mg | ORAL_CAPSULE | Freq: Three times a day (TID) | ORAL | 0 refills | Status: DC

## 2017-09-17 MED ORDER — TRAZODONE HCL 50 MG PO TABS: 25.0000 mg | ORAL_TABLET | Freq: Every evening | ORAL | 0 refills | Status: DC | PRN

## 2017-09-17 MED ORDER — MIRTAZAPINE 15 MG PO TABS: 15.0000 mg | ORAL_TABLET | Freq: Every day | ORAL | 0 refills | Status: DC

## 2017-09-17 MED ORDER — VENLAFAXINE HCL ER 75 MG PO CP24: 75.0000 mg | ORAL_CAPSULE | Freq: Every day | ORAL | 0 refills | Status: DC

## 2017-09-17 NOTE — Progress Notes (Signed)
Recreation Therapy Notes  Date: 3.22.19 Time: 9:30 a.m. Location: [address] Dayroom   Group Topic: Stress Management   Goal Area(s) Addresses:  Goal 1.1: To reduce stress  -Patient will report feeling a reduction in stress level  -Patient will identify the importance of stress management  -Patient will participate during stress management group treatment     Intervention: Stress Management   Activity: Meditattion- Patients were in a peaceful environment with soft lighting enhancing patients mood. Patients listened to mindfulness voice over to decrease stress levels   Education: Stress Management, Discharge Planning.    Education Outcome: Acknowledges edcuation/In group clarification offered/Needs additional education   Clinical Observations/Feedback:: Patient did not attend     Sheryle Hail, Recreation Therapy Intern   Sheryle Hail 09/17/2017 11:49 AM

## 2017-09-17 NOTE — Progress Notes (Signed)
Reservation made with [employer] taxi for Monday 7am pickup to take to [facility]. Cab voucher put on the chart. Garner Nash, MSW, LCSW Clinical Social Worker 09/17/2017 2:10 PM

## 2017-09-17 NOTE — Progress Notes (Signed)
[facility] Group Notes:  (Nursing/MHT/Case Management/Adjunct)  Date:  09/17/2017  Time:  5:35 PM  Type of Therapy:  Nurse Education  Participation Level:  Active  Participation Quality:  Appropriate, Attentive, Sharing and Supportive  Affect:  Appropriate  Cognitive:  Alert, Appropriate and Oriented  Insight:  Appropriate  Engagement in Group:  Engaged and Supportive  Modes of Intervention:  Education  Summary of Progress/Problems: The purpose of this group was to help patients identify what actions they were doing to "stay stuck".  Larry Adams 09/17/2017, 5:35 PM

## 2017-09-17 NOTE — Progress Notes (Addendum)
[facility] MD Progress Note  09/17/2017 10:03 AM Larry Adams  MRN:  [id number]   Subjective: patient reports he is feeling better than on admission but states he is experiencing " a lot of cravings ". He had a plan to go live with his mother until accepted to rehab early next week, but states " I have called her, can't get a hold of her ". At this time states that " if I can't go to mom's, I have no idea where I would go".  Denies medication side effects. Denies suicidal ideations .    Objective:  I have met with patient and  discussed case with treatment team.  Patient presents with partially improved mood, but continues to present with vague dysphoria/irritability. Today , as above, states that he is motivated in sobriety and going to rehab, but that his plan of staying with his mother until he can go to rehab is not currently not working out because he cannot reach her , so he does not know if he can go there or not. With his express consent I attempted to contact mother via phone- no answer, mail box full. CSW also reports he has attempted to contact , without success thus far . Denies medication side effects. Denies suicidal ideations .  Reports significant cravings today .   Principal Problem: MDD (major depressive disorder), recurrent severe, without psychosis ([place]) Diagnosis:   Patient Active Problem List   Diagnosis Date Noted  . Alcohol abuse with alcohol-induced mood disorder ([place]) [F10.14] 09/12/2017  . MDD (major depressive disorder), recurrent severe, without psychosis ([place]) [F33.2] 09/12/2017  . Benzodiazepine dependence ([place]) [F13.20] 07/23/2014  . Alcohol abuse [F10.10] 07/23/2014  . Substance induced mood disorder ([place]) [F19.94] 07/23/2014  . Substance-induced anxiety disorder [facility]) [F19.980] 07/23/2014   Total Time spent with patient: 20 minutes  Past Psychiatric History: See H&P  Past Medical History:  Past Medical History:  Diagnosis Date  . Anxiety   . Back pain    . Mental disorder     Past Surgical History:  Procedure Laterality Date  . HERNIA REPAIR     Family History: History reviewed. No pertinent family history. Family Psychiatric  History: See H&P Social History:  Social History   Substance and Sexual Activity  Alcohol Use Yes   Comment: pint of liquor a day; varies     Social History   Substance and Sexual Activity  Drug Use Yes  . Frequency: 5.0 times per week  . Types: Benzodiazepines, IV   Comment: reports that he is on klonopin and xanax; hx of marijuana use but drug screen presently negative    Social History   Socioeconomic History  . Marital status: Single    Spouse name: Not on file  . Number of children: Not on file  . Years of education: Not on file  . Highest education level: Not on file  Occupational History  . Not on file  Social Needs  . Financial resource strain: Not on file  . Food insecurity:    Worry: Not on file    Inability: Not on file  . Transportation needs:    Medical: Not on file    Non-medical: Not on file  Tobacco Use  . Smoking status: Current Every Day Smoker    Packs/day: 1.00    Types: Cigarettes  . Smokeless tobacco: Former Network engineer and Sexual Activity  . Alcohol use: Yes    Comment: pint of liquor a day; varies  .  Drug use: Yes    Frequency: 5.0 times per week    Types: Benzodiazepines, IV    Comment: reports that he is on klonopin and xanax; hx of marijuana use but drug screen presently negative  . Sexual activity: Yes  Lifestyle  . Physical activity:    Days per week: Not on file    Minutes per session: Not on file  . Stress: Not on file  Relationships  . Social connections:    Talks on phone: Not on file    Gets together: Not on file    Attends religious service: Not on file    Active member of club or organization: Not on file    Attends meetings of clubs or organizations: Not on file    Relationship status: Not on file  Other Topics Concern  . Not on file   Social History Narrative  . Not on file   Additional Social History:   Sleep: Good  Appetite:  Good  Current Medications: Current Facility-Administered Medications  Medication Dose Route Frequency Provider Last Rate Last Dose  . acetaminophen (TYLENOL) tablet 650 mg  650 mg Oral Q6H PRN Ethelene Hal, NP   650 mg at 09/17/17 1610  . alum & mag hydroxide-simeth (MAALOX/MYLANTA) 200-200-20 MG/5ML suspension 30 mL  30 mL Oral Q4H PRN Ethelene Hal, NP      . benztropine (COGENTIN) tablet 1 mg  1 mg Oral Q6H PRN Izediuno, Laruth Bouchard, MD   1 mg at 09/13/17 1520   And  . haloperidol (HALDOL) tablet 5 mg  5 mg Oral Q6H PRN Artist Beach, MD   5 mg at 09/13/17 1520  . gabapentin (NEURONTIN) capsule 300 mg  300 mg Oral TID Cobos, Myer Peer, MD   300 mg at 09/17/17 0917  . ibuprofen (ADVIL,MOTRIN) tablet 600 mg  600 mg Oral Q8H PRN Ethelene Hal, NP   600 mg at 09/16/17 1612  . magnesium hydroxide (MILK OF MAGNESIA) suspension 30 mL  30 mL Oral Daily PRN Ethelene Hal, NP      . methocarbamol (ROBAXIN) tablet 500 mg  500 mg Oral Q6H PRN Money, Lowry Ram, FNP   500 mg at 09/13/17 1208  . mirtazapine (REMERON) tablet 15 mg  15 mg Oral QHS Money, Lowry Ram, FNP   15 mg at 09/16/17 2132  . multivitamin with minerals tablet 1 tablet  1 tablet Oral Daily Lindon Romp A, NP   1 tablet at 09/17/17 [phone]  . nicotine (NICODERM CQ - dosed in mg/24 hours) patch 21 mg  21 mg Transdermal Daily Ethelene Hal, NP   21 mg at 09/17/17 0920  . thiamine (VITAMIN B-1) tablet 100 mg  100 mg Oral Daily Lindon Romp A, NP   100 mg at 09/17/17 0917  . traZODone (DESYREL) tablet 25 mg  25 mg Oral QHS PRN Cobos, Myer Peer, MD   25 mg at 09/16/17 2133  . venlafaxine XR (EFFEXOR-XR) 24 hr capsule 75 mg  75 mg Oral Q breakfast Cobos, Myer Peer, MD   75 mg at 09/17/17 5409    Lab Results: No results found for this or any previous visit (from the past 48 hour(s)).  Blood Alcohol  level:  Lab Results  Component Value Date   ETH 218 (H) 09/11/2017   ETH 233 (H) 81/19/1478    Metabolic Disorder Labs: No results found for: HGBA1C, MPG No results found for: PROLACTIN No results found for: CHOL, TRIG, HDL, CHOLHDL,  VLDL, LDLCALC  Physical Findings: AIMS: Facial and Oral Movements Muscles of Facial Expression: None, normal Lips and Perioral Area: None, normal Jaw: None, normal Tongue: None, normal,Extremity Movements Upper (arms, wrists, hands, fingers): None, normal Lower (legs, knees, ankles, toes): None, normal, Trunk Movements Neck, shoulders, hips: None, normal, Overall Severity Severity of abnormal movements (highest score from questions above): None, normal Incapacitation due to abnormal movements: None, normal Patient's awareness of abnormal movements (rate only patient's report): No Awareness, Dental Status Current problems with teeth and/or dentures?: No Does patient usually wear dentures?: No  CIWA:  CIWA-Ar Total: 0 COWS:  COWS Total Score: 4  Musculoskeletal: Strength & Muscle Tone: within normal limits Gait & Station: normal Patient leans: N/A  Psychiatric Specialty Exam: Physical Exam  Nursing note and vitals reviewed. Constitutional: He is oriented to person, place, and time. He appears well-developed and well-nourished.  Cardiovascular: Normal rate.  Respiratory: Effort normal.  Musculoskeletal: Normal range of motion.  Neurological: He is alert and oriented to person, place, and time.  Skin: Skin is warm.    Review of Systems  Constitutional: Negative.   HENT: Negative.   Eyes: Negative.   Respiratory: Negative.   Cardiovascular: Negative.   Gastrointestinal: Negative.   Genitourinary: Negative.   Musculoskeletal: Positive for back pain.  Skin: Negative.   Neurological: Negative.   Endo/Heme/Allergies: Negative.   Psychiatric/Behavioral: Negative.   denies chest pain, denies shortness of breath  Blood pressure (!) 118/93,  pulse 82, temperature 97.9 F (36.6 C), temperature source Oral, resp. rate 18, height 6' (1.829 m), weight 132.9 kg (293 lb), SpO2 98 %.Body mass index is 39.74 kg/m.  General Appearance: Fairly Groomed  Eye Contact:  Good  Speech:  Normal Rate  Volume:  Normal  Mood:  reports mood as " same", presents vaguely irritable and depressed   Affect:  anxious, subtly irritable   Thought Process:  Linear and Descriptions of Associations: Intact  Orientation:  Other:  fully alert and attentive   Thought Content:  denies hallucinations, no delusions, not internally preoccupied   Suicidal Thoughts:  No denies suicidal or self injurious ideations, denies homicidal or violent ideations  Homicidal Thoughts:  No  Memory:  recent and remote grossly intact   Judgement:  Fair- improving   Insight:  Fair- improving   Psychomotor Activity:  no psychomotor agitation or restlessness   Concentration:  Concentration: Good and Attention Span: Good  Recall:  Good  Fund of Knowledge:  Good  Language:  Good  Akathisia:  No  Handed:  Right  AIMS (if indicated):     Assets:  Communication Skills Desire for Improvement Financial Resources/Insurance Housing Physical Health Social Support Transportation  ADL's:  Intact  Cognition:  WNL  Sleep:  Number of Hours: 6.5   Assessment -  Patient presents vaguely irritable, dysphoric, depressed today. Denies SI. Focusing on disposition planning  . He is planning on going to [facility] starting early next week. In the meantime had been thinking of staying with his mother, but today reports that this plan is currently not definite as has been trying to reach mother without success and is not sure if he can go there . Staff has attempted to contact as well. He describes increased cravings associated with anxiety and it is felt he is at significant risk of relapse . Due to this , ongoing inpatient management is warranted - patient does express high level of  motivation regarding going to [facility]  Monday.   Treatment  Plan Summary: Daily contact with patient to assess and evaluate symptoms and progress in treatment, Medication management and Plan is to:  -Treatment Plan reviewed as below today 3/22 -Encourage group and milieu participation to work on coping skills and symptom reduction -Encourage efforts to work on sobriety and relapse prevention -Continue Effexor-XR 75 mg PO Daily depression -Continue Remeron 15 mg PO QHS for depression, anxiety, insomnia  -Continue Neurontin 300 mg PO TID for agitation/anxiety -Continue Vistaril 25 mg PO Q6H PRN for anxiety -Continue Trazodone 50 mg PO QHS PRN for insomnia   Jenne Campus, MD 09/17/2017, 10:03 AM  Patient ID: Janora Norlander, male   DOB: 1986/06/13, 32 y.o.   MRN: [id number]

## 2017-09-17 NOTE — Progress Notes (Signed)
D: Pt denies SI/HI/AVH. Pt is pleasant and cooperative. Pt was concerned that he wanted to stop by his house to get clothes on the way to [facility].   A: Pt was offered support and encouragement. Pt was given scheduled medications. Pt was encourage to attend groups. Q 15 minute checks were done for safety.   R:Pt attends groups and interacts well with peers and staff. Pt is taking medication. Pt has no complaints.Pt receptive to treatment and safety maintained on unit.

## 2017-09-17 NOTE — Tx Team (Signed)
Interdisciplinary Treatment and Diagnostic Plan Update  09/17/2017 Time of Session: 09:30am Larry Adams MRN: [id number]  Principal Diagnosis: MDD (major depressive disorder), recurrent severe, without psychosis (HCC)  Secondary Diagnoses: Principal Problem:   MDD (major depressive disorder), recurrent severe, without psychosis (HCC)   Current Medications:  Current Facility-Administered Medications  Medication Dose Route Frequency Provider Last Rate Last Dose  . acetaminophen (TYLENOL) tablet 650 mg  650 mg Oral Q6H PRN Laveda Abbe, NP   650 mg at 09/17/17 8295  . alum & mag hydroxide-simeth (MAALOX/MYLANTA) 200-200-20 MG/5ML suspension 30 mL  30 mL Oral Q4H PRN Laveda Abbe, NP      . benztropine (COGENTIN) tablet 1 mg  1 mg Oral Q6H PRN Izediuno, Delight Ovens, MD   1 mg at 09/13/17 1520   And  . haloperidol (HALDOL) tablet 5 mg  5 mg Oral Q6H PRN Georgiann Cocker, MD   5 mg at 09/13/17 1520  . gabapentin (NEURONTIN) capsule 300 mg  300 mg Oral TID Cobos, Rockey Situ, MD   300 mg at 09/17/17 0917  . ibuprofen (ADVIL,MOTRIN) tablet 600 mg  600 mg Oral Q8H PRN Laveda Abbe, NP   600 mg at 09/16/17 1612  . magnesium hydroxide (MILK OF MAGNESIA) suspension 30 mL  30 mL Oral Daily PRN Laveda Abbe, NP      . methocarbamol (ROBAXIN) tablet 500 mg  500 mg Oral Q6H PRN Money, Gerlene Burdock, FNP   500 mg at 09/13/17 1208  . mirtazapine (REMERON) tablet 15 mg  15 mg Oral QHS Money, Gerlene Burdock, FNP   15 mg at 09/16/17 2132  . multivitamin with minerals tablet 1 tablet  1 tablet Oral Daily Nira Conn A, NP   1 tablet at 09/17/17 [phone]  . nicotine (NICODERM CQ - dosed in mg/24 hours) patch 21 mg  21 mg Transdermal Daily Laveda Abbe, NP   21 mg at 09/17/17 0920  . thiamine (VITAMIN B-1) tablet 100 mg  100 mg Oral Daily Nira Conn A, NP   100 mg at 09/17/17 0917  . traZODone (DESYREL) tablet 25 mg  25 mg Oral QHS PRN Cobos, Rockey Situ, MD   25 mg at 09/16/17  2133  . venlafaxine XR (EFFEXOR-XR) 24 hr capsule 75 mg  75 mg Oral Q breakfast Cobos, Rockey Situ, MD   75 mg at 09/17/17 0865   PTA Medications: Medications Prior to Admission  Medication Sig Dispense Refill Last Dose  . hydrOXYzine (ATARAX/VISTARIL) 25 MG tablet Take 1 tablet (25 mg total) by mouth every 6 (six) hours as needed for anxiety (agitation). (Patient not taking: Reported on 09/11/2017) 45 tablet 0 Not Taking at Unknown time  . Multiple Vitamin (MULTIVITAMIN WITH MINERALS) TABS tablet Take 1 tablet by mouth daily. For nutrition supplementation (Patient not taking: Reported on 09/11/2017)   Not Taking at Unknown time  . zolpidem (AMBIEN) 5 MG tablet Take 1 tablet (5 mg total) by mouth at bedtime as needed for sleep. (Patient not taking: Reported on 09/11/2017) 7 tablet 0 Not Taking    Patient Stressors: Marital or family conflict Substance abuse  Patient Strengths: Capable of independent living Manufacturing systems engineer Motivation for treatment/growth Supportive family/friends  Treatment Modalities: Medication Management, Group therapy, Case management,  1 to 1 session with clinician, Psychoeducation, Recreational therapy.   Physician Treatment Plan for Primary Diagnosis: MDD (major depressive disorder), recurrent severe, without psychosis (HCC) Long Term Goal(s): Improvement in symptoms so as ready for discharge Improvement in  symptoms so as ready for discharge   Short Term Goals: Ability to identify changes in lifestyle to reduce recurrence of condition will improve Ability to verbalize feelings will improve Ability to disclose and discuss suicidal ideas Ability to maintain clinical measurements within normal limits will improve Compliance with prescribed medications will improve Ability to identify triggers associated with substance abuse/mental health issues will improve  Medication Management: Evaluate patient's response, side effects, and tolerance of medication  regimen.  Therapeutic Interventions: 1 to 1 sessions, Unit Group sessions and Medication administration.  Evaluation of Outcomes: Progressing  Physician Treatment Plan for Secondary Diagnosis: Principal Problem:   MDD (major depressive disorder), recurrent severe, without psychosis (HCC)  Long Term Goal(s): Improvement in symptoms so as ready for discharge Improvement in symptoms so as ready for discharge   Short Term Goals: Ability to identify changes in lifestyle to reduce recurrence of condition will improve Ability to verbalize feelings will improve Ability to disclose and discuss suicidal ideas Ability to maintain clinical measurements within normal limits will improve Compliance with prescribed medications will improve Ability to identify triggers associated with substance abuse/mental health issues will improve     Medication Management: Evaluate patient's response, side effects, and tolerance of medication regimen.  Therapeutic Interventions: 1 to 1 sessions, Unit Group sessions and Medication administration.  Evaluation of Outcomes: Progressing   RN Treatment Plan for Primary Diagnosis: MDD (major depressive disorder), recurrent severe, without psychosis (HCC) Long Term Goal(s): Knowledge of disease and therapeutic regimen to maintain health will improve  Short Term Goals: Ability to verbalize feelings will improve, Ability to disclose and discuss suicidal ideas, Ability to identify and develop effective coping behaviors will improve and Compliance with prescribed medications will improve  Medication Management: RN will administer medications as ordered by provider, will assess and evaluate patient's response and provide education to patient for prescribed medication. RN will report any adverse and/or side effects to prescribing provider.  Therapeutic Interventions: 1 on 1 counseling sessions, Psychoeducation, Medication administration, Evaluate responses to treatment,  Monitor vital signs and CBGs as ordered, Perform/monitor CIWA, COWS, AIMS and Fall Risk screenings as ordered, Perform wound care treatments as ordered.  Evaluation of Outcomes: Progressing   LCSW Treatment Plan for Primary Diagnosis: MDD (major depressive disorder), recurrent severe, without psychosis (HCC) Long Term Goal(s): Safe transition to appropriate next level of care at discharge, Engage patient in therapeutic group addressing interpersonal concerns.  Short Term Goals: Engage patient in aftercare planning with referrals and resources, Increase social support, Increase emotional regulation, Identify triggers associated with mental health/substance abuse issues and Increase skills for wellness and recovery  Therapeutic Interventions: Assess for all discharge needs, 1 to 1 time with Social worker, Explore available resources and support systems, Assess for adequacy in community support network, Educate family and significant other(s) on suicide prevention, Complete Psychosocial Assessment, Interpersonal group therapy.  Evaluation of Outcomes: Progressing   Progress in Treatment: Attending groups: Yes. Participating in groups: Yes. Minimally. Taking medication as prescribed: Yes. Toleration medication: Yes. Family/Significant other contact made: No, will contact:  mother will not answer calls from patient or [facility] staff Patient understands diagnosis: Yes. Discussing patient identified problems/goals with staff: Yes. Medical problems stabilized or resolved: Yes. Denies suicidal/homicidal ideation: Yes. Issues/concerns per patient self-inventory: Yes.  New problem(s) identified: Yes, Describe:  patient concerned about relapse  New Short Term/Long Term Goal(s): Patient's goal "get substance use treatment."  Discharge Plan or Barriers: Patient will go to [facility] for residential treatment on 03/25  Reason for  Continuation of Hospitalization: Anxiety Depression Suicidal  ideation Withdrawal symptoms  Estimated Length of Stay: Patient to discharge 09/20/17  Attendees: Patient: Larry Adams 09/17/2017 11:30 AM  Physician: Javier Docker 09/17/2017 11:30 AM  Nursing: Alexia Freestone, RN 09/17/2017 11:30 AM  RN Care Manager: 09/17/2017 11:30 AM  Social Worker: Garner Nash, CSW 09/17/2017 11:30 AM  Recreational Therapist:  09/17/2017 11:30 AM  Other: Enid Cutter, SW Intern 09/17/2017 11:30 AM  Other:  09/17/2017 11:30 AM  Other: 09/17/2017 11:30 AM    Scribe for Treatment Team: Darreld Mclean, Student-Social Work 09/17/2017 11:30 AM

## 2017-09-17 NOTE — Progress Notes (Cosign Needed)
  [facility] Adult Case Management Discharge Plan :  Will you be returning to the same living situation after discharge:  No. Patient discharging to [facility] inpatient treatment. At discharge, do you have transportation home?: Yes,  Cab voucher provided by CSW Do you have the ability to pay for your medications: Yes,  [facility]   Release of information consent forms completed and in the chart;  Patient's signature needed at discharge.  Patient to Follow up at: Follow-up Information    Services, [facility]. Go on 09/20/2017.   Why:  Please attend your intake appt on Monday, 09/20/17, at 8:00am. Please bring proof of [facility] residence. Contact information: [address] [place] [address] [phone]        [facility]. Go on 09/22/2017.   Why:  Please attend your intake appt on Wednesday, 09/22/17, at 8:15am.  Please bring photo ID, social security card, proof of any household income, and copy of your hospital discharge paperwork. Contact information: [address] [place] [address] [phone]           Next level of care provider has access to [facility] Link:no  Safety Planning and Suicide Prevention discussed: Yes,  completed with patient. Several contact attempts made to mother.   Has patient been referred to the Quitline?: Patient refused referral  Patient has been referred for addiction treatment: Yes  Darreld Mclean, Student-Social Work 09/17/2017, 2:39 PM

## 2017-09-17 NOTE — Progress Notes (Signed)
D patient is observed OOB UAL on the 400 hall today. HE endorses a flat, irritable-like affect. HE maintains good eye cotnact. HE focuses on the negative. This is evident in his conversation. He says " I don't think some things are fixable.Marland Kitchenibuprofen think its easier to say it than to do it". A Writer processed with patient that , indeed, his recovery is dependent on his belief system: that inorder to impact his feelings he must first change his negative thinking and concentrate on aking his choices healtheir. He agrees, Careers information officer. HE completed his daily assessment and  On this he wrote he denied SI today and he rated his depression, hopelessness and anxeity " 6/6/7", respectively. R Safety is in place and poc cont.

## 2017-09-17 NOTE — Progress Notes (Signed)
Psychoeducational Group Note  Date:  09/17/2017 Time:  1321  Group Topic/Focus:  Goals Group:   The focus of this group is to help patients establish daily goals to achieve during treatment and discuss how the patient can incorporate goal setting into their daily lives to aide in recovery.  Participation Level: Did Not Attend  Participation Quality:  Not Applicable  Affect:  Not Applicable  Cognitive:  Not Applicable  Insight:  Not Applicable  Engagement in Group: Not Applicable  Additional Comments:  Pt refused to attend group this morning.  Orine Goga E 09/17/2017, 1:22 PM

## 2017-09-17 NOTE — Progress Notes (Signed)
Adult Psychoeducational Group Note  Date:  09/17/2017 Time:  9:31 PM  Group Topic/Focus:  Wrap-Up Group:   The focus of this group is to help patients review their daily goal of treatment and discuss progress on daily workbooks.  Participation Level:  Active  Participation Quality:  Appropriate  Affect:  Appropriate  Cognitive:  Alert and Oriented  Insight: Good  Engagement in Group:  Engaged  Modes of Intervention:  Discussion  Additional Comments:  Pt rated his day a 5/10. Pt stated it was so low because he did not have a good conversation with his mother. He said all he wanted was for her to bring him some clothes, but he stated "she's too busy getting fucked up to do that". Pt would like some clothes tomorrow as well, but doubts she will bring him any.   Larry Adams 09/17/2017, 9:31 PM

## 2017-09-17 NOTE — [facility] Group Notes (Signed)
[facility] LCSW Group Therapy Note  Date/Time: 09/17/17, 1315  Type of Therapy/Topic:  Group Therapy:  Feelings about Diagnosis  Participation Level:  Active   Mood:pleasant   Description of Group:    This group will allow patients to explore their thoughts and feelings about diagnoses they have received. Patients will be guided to explore their level of understanding and acceptance of these diagnoses. Facilitator will encourage patients to process their thoughts and feelings about the reactions of others to their diagnosis, and will guide patients in identifying ways to discuss their diagnosis with significant others in their lives. This group will be process-oriented, with patients participating in exploration of their own experiences as well as giving and receiving support and challenge from other group members.   Therapeutic Goals: 1. Patient will demonstrate understanding of diagnosis as evidence by identifying two or more symptoms of the disorder:  2. Patient will be able to express two feelings regarding the diagnosis 3. Patient will demonstrate ability to communicate their needs through discussion and/or role plays  Summary of Patient Progress:Pt was attentive in group, was able to identify his depression diagnosis, and made several comments.        Therapeutic Modalities:   Cognitive Behavioral Therapy Brief Therapy Feelings Identification   Daleen Squibb, LCSW

## 2017-09-18 MED ORDER — TRAZODONE HCL 50 MG PO TABS
50.0000 mg | ORAL_TABLET | Freq: Every evening | ORAL | Status: DC | PRN
  Administered 2017-09-18 – 2017-09-19 (×2): 50 mg via ORAL
  Filled 2017-09-18: qty 14
  Filled 2017-09-18 (×2): qty 1

## 2017-09-18 NOTE — Progress Notes (Addendum)
D Patient is seen [facility] on the 400 hall today. HE is quiet, flat and irritable. He watches TV in the dayroom much of the day. AA HE completed his daily assessment and on this he wrote he he deneid SI today and he rated his depression, hopelessness and anxeity " 0/0/0/", respectively.   He is not happy about being here,,,he talks openly about his plans to go to [facility] on Monday morning and that eh " can't wait{" to get [place] here. R Safety is in place and poc cotnd.

## 2017-09-18 NOTE — Discharge Summary (Addendum)
Physician Discharge Summary Note  Patient:  Larry Adams is an 31 y.o., male MRN:  [id number] DOB:  03/18/1986 Patient phone:  [phone] (home)  Patient address:   [address] [place] [address],  Total Time spent with patient: 20 minutes  Date of Admission:  09/12/2017 Date of Discharge: 09/18/2017  Reason for Admission: Per assessment note-  09/12/17 [facility] Counselor Assessment: 31 y.o.malewho presents to the ED voluntarily due to SI with a plan to either hang himself or OD on heroin.Pt states his son's mother took their child away from him about 6 months ago and he has not seen him since. Pt states his son's first birthday is April 1st and he feels worthless because he cannot see his son. Pt states he has no desire to live. Pt states he has been using heroin daily, as much as he can get. Pt states he recently had thoughts of using enough heroin to OD. Pt states he is not working, has no desire or motivation to go on with his life. Pt states he has "lost all hope and just wants to die." Pt is crying throughout the assessment and states to this writer if he does not get help, he feels that he is going to kill himself. Pt denies HI and denies AVH to this Clinical research associate. Pt has a hx of inpt admissions in 2016 c/o polysubstance abuse and SI. Pt states he has minimal supports and no current OPT provider.   Principal Problem: MDD (major depressive disorder), recurrent severe, without psychosis [facility]) Discharge Diagnoses: Patient Active Problem List   Diagnosis Date Noted  . Alcohol abuse with alcohol-induced mood disorder (HCC) [F10.14] 09/12/2017  . MDD (major depressive disorder), recurrent severe, without psychosis (HCC) [F33.2] 09/12/2017  . Benzodiazepine dependence (HCC) [F13.20] 07/23/2014  . Alcohol abuse [F10.10] 07/23/2014  . Substance induced mood disorder (HCC) [F19.94] 07/23/2014  . Substance-induced anxiety disorder [facility]) [F19.980] 07/23/2014    Past Psychiatric  History:  Past Medical History:  Past Medical History:  Diagnosis Date  . Anxiety   . Back pain   . Mental disorder     Past Surgical History:  Procedure Laterality Date  . HERNIA REPAIR     Family History: History reviewed. No pertinent family history. Family Psychiatric  History:  Social History:  Social History   Substance and Sexual Activity  Alcohol Use Yes   Comment: pint of liquor a day; varies     Social History   Substance and Sexual Activity  Drug Use Yes  . Frequency: 5.0 times per week  . Types: Benzodiazepines, IV   Comment: reports that he is on klonopin and xanax; hx of marijuana use but drug screen presently negative    Social History   Socioeconomic History  . Marital status: Single    Spouse name: Not on file  . Number of children: Not on file  . Years of education: Not on file  . Highest education level: Not on file  Occupational History  . Not on file  Social Needs  . Financial resource strain: Not on file  . Food insecurity:    Worry: Not on file    Inability: Not on file  . Transportation needs:    Medical: Not on file    Non-medical: Not on file  Tobacco Use  . Smoking status: Current Every Day Smoker    Packs/day: 1.00    Types: Cigarettes  . Smokeless tobacco: Former Engineer, water and Sexual Activity  . Alcohol use:  Yes    Comment: pint of liquor a day; varies  . Drug use: Yes    Frequency: 5.0 times per week    Types: Benzodiazepines, IV    Comment: reports that he is on klonopin and xanax; hx of marijuana use but drug screen presently negative  . Sexual activity: Yes  Lifestyle  . Physical activity:    Days per week: Not on file    Minutes per session: Not on file  . Stress: Not on file  Relationships  . Social connections:    Talks on phone: Not on file    Gets together: Not on file    Attends religious service: Not on file    Active member of club or organization: Not on file    Attends meetings of clubs or  organizations: Not on file    Relationship status: Not on file  Other Topics Concern  . Not on file  Social History Narrative  . Not on file    Hospital Course: Larry Adams was admitted for MDD (major depressive disorder), recurrent severe, without psychosis (HCC) and crisis management.  Pt was treated discharged with the medications listed below under Medication List.  Medical problems were identified and treated as needed.  Home medications were restarted as appropriate.  Improvement was monitored by observation and Larry Adams 's daily report of symptom reduction.  Emotional and mental status was monitored by daily self-inventory reports completed by Larry Adams and clinical staff.         Larry Adams was evaluated by the treatment team for stability and plans for continued recovery upon discharge. Larry Adams 's motivation was an integral factor for scheduling further treatment. Employment, transportation, bed availability, health status, family support, and any pending legal issues were also considered during hospital stay. Pt was offered further treatment options upon discharge including but not limited to Residential, Intensive Outpatient, and Outpatient treatment.  Larry Adams will follow up with the services as listed below under Follow Up Information.     Upon completion of this admission the patient was both mentally and medically stable for discharge denying suicidal/homicidal ideation, auditory/visual/tactile hallucinations, delusional thoughts and paranoia.    Larry Adams responded well to treatment with Effexor 75 mg, Neurotin 300mg  without adverse effects.  Pt demonstrated improvement without reported or observed adverse effects to the point of stability appropriate for outpatient management. Pertinent labs include: CMP and and for which outpatient follow-up is necessary for lab recheck as mentioned below. Reviewed CBC, CMP, BAL + 218, and UDS; all  unremarkable aside from noted exceptions.   Physical Findings: AIMS: Facial and Oral Movements Muscles of Facial Expression: None, normal Lips and Perioral Area: None, normal Jaw: None, normal Tongue: None, normal,Extremity Movements Upper (arms, wrists, hands, fingers): None, normal Lower (legs, knees, ankles, toes): None, normal, Trunk Movements Neck, shoulders, hips: None, normal, Overall Severity Severity of abnormal movements (highest score from questions above): None, normal Incapacitation due to abnormal movements: None, normal Patient's awareness of abnormal movements (rate only patient's report): No Awareness, Dental Status Current problems with teeth and/or dentures?: No Does patient usually wear dentures?: No  CIWA:  CIWA-Ar Total: 0 COWS:  COWS Total Score: 4  Musculoskeletal: Strength & Muscle Tone: within normal limits Gait & Station: normal Patient leans: N/A  Psychiatric Specialty Exam: See SRA by MD  Physical Exam  Vitals reviewed. Constitutional: He appears well-developed.  Cardiovascular: Normal rate.  Neurological: He is alert.  Psychiatric: He has a normal mood and  affect. His behavior is normal.    Review of Systems  Psychiatric/Behavioral: Negative for depression (stable ) and substance abuse. The patient is not nervous/anxious.   All other systems reviewed and are negative.   Blood pressure (!) 134/99, pulse 85, temperature 98.3 F (36.8 C), temperature source Oral, resp. rate 20, height 6' (1.829 m), weight 132.9 kg (293 lb), SpO2 98 %.Body mass index is 39.74 kg/m.     Has this patient used any form of tobacco in the last 30 days? (Cigarettes, Smokeless Tobacco, Cigars, and/or Pipes) Yes, No  Blood Alcohol level:  Lab Results  Component Value Date   ETH 218 (H) 09/11/2017   ETH 233 (H) 07/23/2014    Metabolic Disorder Labs:  No results found for: HGBA1C, MPG No results found for: PROLACTIN No results found for: CHOL, TRIG, HDL, CHOLHDL,  VLDL, LDLCALC  See Psychiatric Specialty Exam and Suicide Risk Assessment completed by Attending Physician prior to discharge.  Discharge destination:  Home  Is patient on multiple antipsychotic therapies at discharge:  No   Has Patient had three or more failed trials of antipsychotic monotherapy by history:  No  Recommended Plan for Multiple Antipsychotic Therapies: NA  Discharge Instructions    Diet - low sodium heart healthy   Complete by:  As directed    Discharge instructions   Complete by:  As directed    Take all medications as prescribed. Keep all follow-up appointments as scheduled.  Do not consume alcohol or use illegal drugs while on prescription medications. Report any adverse effects from your medications to your primary care provider promptly.  In the event of recurrent symptoms or worsening symptoms, call 911, a crisis hotline, or go to the nearest emergency department for evaluation.   Increase activity slowly   Complete by:  As directed      Allergies as of 09/18/2017      Reactions   Morphine And Related Hives      Medication List    STOP taking these medications   hydrOXYzine 25 MG tablet Commonly known as:  ATARAX/VISTARIL   multivitamin with minerals Tabs tablet   zolpidem 5 MG tablet Commonly known as:  AMBIEN     TAKE these medications     Indication  gabapentin 300 MG capsule Commonly known as:  NEURONTIN Take 1 capsule (300 mg total) by mouth 3 (three) times daily.  Indication:  Abuse or Misuse of Alcohol, Alcohol Withdrawal Syndrome   mirtazapine 15 MG tablet Commonly known as:  REMERON Take 1 tablet (15 mg total) by mouth at bedtime.  Indication:  Major Depressive Disorder   traZODone 50 MG tablet Commonly known as:  DESYREL Take 0.5 tablets (25 mg total) by mouth at bedtime as needed for sleep.  Indication:  Trouble Sleeping   venlafaxine XR 75 MG 24 hr capsule Commonly known as:  EFFEXOR-XR Take 1 capsule (75 mg total) by mouth  daily with breakfast.  Indication:  Major Depressive Disorder      Follow-up Information    Services, [facility]. Go on 09/20/2017.   Why:  Please attend your intake appt on Monday, 09/20/17, at 8:00am. Please bring proof of [facility] residence. Contact information: [address] [place] [address] [phone]        [facility]. Go on 09/22/2017.   Why:  Please attend your intake appt on Wednesday, 09/22/17, at 8:15am.  Please bring photo ID, social security card, proof of any household income, and copy of your  hospital discharge paperwork. Contact information: [address] [place] [address] [phone]           Follow-up recommendations:  Activity:  as tolerated  Diet:  heart healthy  Comments:  Take all medications as prescribed. Keep all follow-up appointments as scheduled.  Do not consume alcohol or use illegal drugs while on prescription medications. Report any adverse effects from your medications to your primary care provider promptly.  In the event of recurrent symptoms or worsening symptoms, call 911, a crisis hotline, or go to the nearest emergency department for evaluation.   Signed: Oneta Rack, NP 09/18/2017, 10:20 AM   Patient seen, Suicide Assessment Completed.  Disposition Plan Reviewed

## 2017-09-18 NOTE — Progress Notes (Signed)
D.  Pt pleasant on approach, denies complaints at this time.  Pt was positive for evening wrap up group, observed engaged in appropriate interaction with peers on the unit.  Pt denies SI/HI/AVH at this time.  A.  Support and encouragement offered, medication given as ordered  R.  Pt remains safe on the unit, will continue to monitor.  

## 2017-09-18 NOTE — Progress Notes (Addendum)
[facility] MD Progress Note  09/18/2017 4:34 PM Larry Adams  MRN:  [id number]   Subjective: patient reports he is feeling better, but remains vaguely anxious and ruminative about disposition planning issues . In particular states he is concerned due to not having enough clothes to last while he is in rehab- called his mother to bring him some clothes to unit but states " I do not think she is going to do it, she drinks all the time ". He states he is hoping he will be able to stop by his home briefly prior to going to [facility] in order to retrieve some clothes.  He does state he continues to be motivated in going to [facility]/rehab, and states " I really have nowhere else to go right now".  He feels he would be at high risk of relapse if he does not go to rehab . Denies suicidal ideations. Denies medication side effects, but states he does not feel Effexor has started working yet .  Objective:  I have met with patient and have reviewed chart notes. Patient presents alert, attentive. Describes improved mood but does present vaguely irritable, dysphoric, although improving . Does smile at times appropriately and affect improves partially during session. As above, his major stressor at present is concern his mother will not bring him clothes to unit prior to him leaving for [facility] on Monday morning . Reports some lingering cravings, but reports motivation in ongoing sobriety efforts . Denies suicidal ideations. Does not endorse medication side effects ( On Effexor XR, Remeron combination)  Behavior on unit in good control, visible in day room but interactions with peers limited .  Principal Problem: MDD (major depressive disorder), recurrent severe, without psychosis ([place]) Diagnosis:   Patient Active Problem List   Diagnosis Date Noted  . Alcohol abuse with alcohol-induced mood disorder ([place]) [F10.14] 09/12/2017  . MDD (major depressive disorder), recurrent severe, without psychosis ([place])  [F33.2] 09/12/2017  . Benzodiazepine dependence ([place]) [F13.20] 07/23/2014  . Alcohol abuse [F10.10] 07/23/2014  . Substance induced mood disorder ([place]) [F19.94] 07/23/2014  . Substance-induced anxiety disorder [facility]) [F19.980] 07/23/2014   Total Time spent with patient: 20 minutes  Past Psychiatric History: See H&P  Past Medical History:  Past Medical History:  Diagnosis Date  . Anxiety   . Back pain   . Mental disorder     Past Surgical History:  Procedure Laterality Date  . HERNIA REPAIR     Family History: History reviewed. No pertinent family history. Family Psychiatric  History: See H&P Social History:  Social History   Substance and Sexual Activity  Alcohol Use Yes   Comment: pint of liquor a day; varies     Social History   Substance and Sexual Activity  Drug Use Yes  . Frequency: 5.0 times per week  . Types: Benzodiazepines, IV   Comment: reports that he is on klonopin and xanax; hx of marijuana use but drug screen presently negative    Social History   Socioeconomic History  . Marital status: Single    Spouse name: Not on file  . Number of children: Not on file  . Years of education: Not on file  . Highest education level: Not on file  Occupational History  . Not on file  Social Needs  . Financial resource strain: Not on file  . Food insecurity:    Worry: Not on file    Inability: Not on file  . Transportation needs:    Medical: Not on  file    Non-medical: Not on file  Tobacco Use  . Smoking status: Current Every Day Smoker    Packs/day: 1.00    Types: Cigarettes  . Smokeless tobacco: Former Network engineer and Sexual Activity  . Alcohol use: Yes    Comment: pint of liquor a day; varies  . Drug use: Yes    Frequency: 5.0 times per week    Types: Benzodiazepines, IV    Comment: reports that he is on klonopin and xanax; hx of marijuana use but drug screen presently negative  . Sexual activity: Yes  Lifestyle  . Physical activity:    Days  per week: Not on file    Minutes per session: Not on file  . Stress: Not on file  Relationships  . Social connections:    Talks on phone: Not on file    Gets together: Not on file    Attends religious service: Not on file    Active member of club or organization: Not on file    Attends meetings of clubs or organizations: Not on file    Relationship status: Not on file  Other Topics Concern  . Not on file  Social History Narrative  . Not on file   Additional Social History:   Sleep: Good  Appetite:  Good  Current Medications: Current Facility-Administered Medications  Medication Dose Route Frequency Provider Last Rate Last Dose  . acetaminophen (TYLENOL) tablet 650 mg  650 mg Oral Q6H PRN Ethelene Hal, NP   650 mg at 09/18/17 1104  . alum & mag hydroxide-simeth (MAALOX/MYLANTA) 200-200-20 MG/5ML suspension 30 mL  30 mL Oral Q4H PRN Ethelene Hal, NP      . benztropine (COGENTIN) tablet 1 mg  1 mg Oral Q6H PRN Izediuno, Laruth Bouchard, MD   1 mg at 09/13/17 1520   And  . haloperidol (HALDOL) tablet 5 mg  5 mg Oral Q6H PRN Artist Beach, MD   5 mg at 09/13/17 1520  . gabapentin (NEURONTIN) capsule 300 mg  300 mg Oral TID Arantza Darrington, Myer Peer, MD   300 mg at 09/18/17 1432  . ibuprofen (ADVIL,MOTRIN) tablet 600 mg  600 mg Oral Q8H PRN Ethelene Hal, NP   600 mg at 09/17/17 1406  . magnesium hydroxide (MILK OF MAGNESIA) suspension 30 mL  30 mL Oral Daily PRN Ethelene Hal, NP      . methocarbamol (ROBAXIN) tablet 500 mg  500 mg Oral Q6H PRN Money, Lowry Ram, FNP   500 mg at 09/18/17 1556  . mirtazapine (REMERON) tablet 15 mg  15 mg Oral QHS Money, Lowry Ram, FNP   15 mg at 09/17/17 2123  . multivitamin with minerals tablet 1 tablet  1 tablet Oral Daily Lindon Romp A, NP   1 tablet at 09/18/17 1101  . nicotine (NICODERM CQ - dosed in mg/24 hours) patch 21 mg  21 mg Transdermal Daily Ethelene Hal, NP   21 mg at 09/18/17 1101  . thiamine (VITAMIN  B-1) tablet 100 mg  100 mg Oral Daily Lindon Romp A, NP   100 mg at 09/18/17 1101  . traZODone (DESYREL) tablet 50 mg  50 mg Oral QHS PRN Anae Hams, Myer Peer, MD      . venlafaxine XR (EFFEXOR-XR) 24 hr capsule 75 mg  75 mg Oral Q breakfast Neev Mcmains, Myer Peer, MD   75 mg at 09/18/17 1102    Lab Results: No results found for this or any previous  visit (from the past 48 hour(s)).  Blood Alcohol level:  Lab Results  Component Value Date   ETH 218 (H) 09/11/2017   ETH 233 (H) 07/07/3233    Metabolic Disorder Labs: No results found for: HGBA1C, MPG No results found for: PROLACTIN No results found for: CHOL, TRIG, HDL, CHOLHDL, VLDL, LDLCALC  Physical Findings: AIMS: Facial and Oral Movements Muscles of Facial Expression: None, normal Lips and Perioral Area: None, normal Jaw: None, normal Tongue: None, normal,Extremity Movements Upper (arms, wrists, hands, fingers): None, normal Lower (legs, knees, ankles, toes): None, normal, Trunk Movements Neck, shoulders, hips: None, normal, Overall Severity Severity of abnormal movements (highest score from questions above): None, normal Incapacitation due to abnormal movements: None, normal Patient's awareness of abnormal movements (rate only patient's report): No Awareness, Dental Status Current problems with teeth and/or dentures?: No Does patient usually wear dentures?: No  CIWA:  CIWA-Ar Total: 0 COWS:  COWS Total Score: 4  Musculoskeletal: Strength & Muscle Tone: within normal limits Gait & Station: normal Patient leans: N/A  Psychiatric Specialty Exam: Physical Exam  Nursing note and vitals reviewed. Constitutional: He is oriented to person, place, and time. He appears well-developed and well-nourished.  Cardiovascular: Normal rate.  Respiratory: Effort normal.  Musculoskeletal: Normal range of motion.  Neurological: He is alert and oriented to person, place, and time.  Skin: Skin is warm.    Review of Systems  Constitutional:  Negative.   HENT: Negative.   Eyes: Negative.   Respiratory: Negative.   Cardiovascular: Negative.   Gastrointestinal: Negative.   Genitourinary: Negative.   Musculoskeletal: Positive for back pain.  Skin: Negative.   Neurological: Negative.   Endo/Heme/Allergies: Negative.   Psychiatric/Behavioral: Negative.   reports chronic back pain , denies chest pain, no dyspnea   Blood pressure (!) 134/99, pulse 85, temperature 98.3 F (36.8 C), temperature source Oral, resp. rate 20, height 6' (1.829 m), weight 132.9 kg (293 lb), SpO2 98 %.Body mass index is 39.74 kg/m.  General Appearance: improving grooming   Eye Contact:  Good  Speech:  Normal Rate  Volume:  Normal  Mood:  denies feeling depression, states mood "OK", presents vaguely dysphoric  Affect:  anxious, affect does improve partially during session  Thought Process:  Linear and Descriptions of Associations: Intact  Orientation:  Other:  fully alert and attentive   Thought Content:  denies hallucinations, no delusions, not internally preoccupied   Suicidal Thoughts:  No denies suicidal or self injurious ideations, denies homicidal or violent ideations  Homicidal Thoughts:  No  Memory:  recent and remote grossly intact   Judgement:  Fair- improving   Insight:  Fair- improving   Psychomotor Activity:  no psychomotor agitation or restlessness   Concentration:  Concentration: Good and Attention Span: Good  Recall:  Good  Fund of Knowledge:  Good  Language:  Good  Akathisia:  No  Handed:  Right  AIMS (if indicated):     Assets:  Communication Skills Desire for Improvement Financial Resources/Insurance Housing Physical Health Social Support Transportation  ADL's:  Intact  Cognition:  WNL  Sleep:  Number of Hours: 6.75   Assessment -  Patient reports mood is improved and currently does not endorse significant depression, denies SI. He is future oriented, and states he is motivated about going to Rehab at discharge (  currently scheduled to go to [facility] Monday in AM). Presents vaguely dysphoric, anxious, irritable, and focuses on concern mother did not bring him clothes .  Denies medication side effects,  but states he does not feel Effexor XR has " started to work yet"- we have reviewed side effect profile and therapeutic lag associated with antidepressant trial.  Treatment Plan Summary: Daily contact with patient to assess and evaluate symptoms and progress in treatment, Medication management and Plan is to:  -Treatment Plan reviewed as below today 3/23 -Encourage group and milieu participation to work on coping skills and symptom reduction -Encourage efforts to work on sobriety and relapse prevention -Continue Effexor-XR 75 mg PO Daily depression, anxiety -Continue Remeron 15 mg PO QHS for depression, anxiety, insomnia  -Continue Neurontin 300 mg PO TID for agitation/anxiety -Continue Vistaril 25 mg PO Q6H PRN for anxiety -Continue Trazodone 50 mg PO QHS PRN for insomnia- states he slept better at this dose and denies current side effects.  -Treatment team working on disposition planning , see above. Patient to discuss options with CSW as well .    Jenne Campus, MD 09/18/2017, 4:34 PM   Patient ID: Janora Norlander, male   DOB: [birth year], 32 y.o.   MRN: [id number]

## 2017-09-18 NOTE — [facility] Group Notes (Signed)
[facility] Group Notes: (Clinical Social Work)   09/18/2017      Type of Therapy:  Group Therapy   Participation Level:  Did Not Attend despite MHT prompting   Ambrose Mantle, LCSW 09/18/2017, 1:42 PM

## 2017-09-19 MED ORDER — TRAZODONE HCL 50 MG PO TABS: 50.0000 mg | ORAL_TABLET | Freq: Every evening | ORAL | 0 refills | Status: DC | PRN

## 2017-09-19 NOTE — B[facility] Counselor (Signed)
At patient's request, CSW provided clothing to patient from the clothing closet, including shorts and short-sleeved shirt.  Lynnell Chad 09/19/2017 8:50 AM

## 2017-09-19 NOTE — B[facility] Group Notes (Signed)
[facility] Group Notes:  (Nursing)  Date:  09/19/2017  Time:  1:15 PM Type of Therapy:  Nurse Education  Participation Level:  Active  Participation Quality:  Appropriate  Affect:  Appropriate  Cognitive:  Alert and Appropriate  Insight:  Appropriate  Engagement in Group:  Engaged  Modes of Intervention:  Discussion and Education  Summary of Progress/Problems: Patient appropriately engaged in Life Skills group led by RN  Shela Nevin 09/19/2017, 4:53 PM

## 2017-09-19 NOTE — B[facility] Suicide Risk Assessment (Addendum)
[facility] Discharge Suicide Risk Assessment   Principal Problem: MDD (major depressive disorder), recurrent severe, without psychosis ([facility]) Discharge Diagnoses:  Patient Active Problem List   Diagnosis Date Noted  . Alcohol abuse with alcohol-induced mood disorder ([facility]) [F10.14] 09/12/2017  . MDD (major depressive disorder), recurrent severe, without psychosis ([facility]) [F33.2] 09/12/2017  . Benzodiazepine dependence ([facility]) [F13.20] 07/23/2014  . Alcohol abuse [F10.10] 07/23/2014  . Substance induced mood disorder ([facility]) [F19.94] 07/23/2014  . Substance-induced anxiety disorder [facility]) [F19.980] 07/23/2014    Total Time spent with patient: 30 minutes  Musculoskeletal: Strength & Muscle Tone: within normal limits Gait & Station: normal Patient leans: N/A  Psychiatric Specialty Exam: ROS no headache, no chest pain, no shortness of breath, no vomiting, no rash   Blood pressure 130/84, pulse 77, temperature 98.3 F (36.8 C), temperature source Oral, resp. rate 16, height 6' (1.829 m), weight 132.9 kg (293 lb), SpO2 98 %.Body mass index is 39.74 kg/m.  General Appearance: Fairly Groomed  Patent attorney::  Good  Speech:  Normal Rate  Volume:  Normal  Mood:  reports he is feeling better, describes mood as improved   Affect:  improved, smiles at times appropriately, subtly irritable  Thought Process:  Linear and Descriptions of Associations: Intact  Orientation:  Full (Time, Place, and Person)  Thought Content:  denies hallucinations, no delusions, not internally preoccupied   Suicidal Thoughts:  No denies suicidal or self injurious ideations, denies homicidal or violent ideations  Homicidal Thoughts:  No  Memory:  recent and remote grossly intact   Judgement:  Other:  improving   Insight:  improving   Psychomotor Activity:  Normal  Concentration:  Good  Recall:  Good  Fund of Knowledge:Good  Language: Good  Akathisia:  Negative  Handed:  Right  AIMS (if indicated):     Assets:  Desire for  Improvement Resilience  Sleep:  Number of Hours: 6.25  Cognition: WNL  ADL's:  Intact   Mental Status Per Nursing Assessment::   On Admission:     Demographic Factors:  32 year old male , separated, has one son who is living out of state with the mother  Loss Factors: Relapse, substance abuse . Not being able to see his son.   Historical Factors: History of depression, history of substance use disorder, reports opiates are current substance of choice .  Risk Reduction Factors:   Responsible for children under 41 years of age, Sense of responsibility to family and Positive coping skills or problem solving skills  Continued Clinical Symptoms:  At this time patient reports improvement compared to admission presentation. Presents alert, attentive, reports improving mood , minimizes depression at present, affect presents vaguely irritable but reactive, no thought disorder, no suicidal or self injurious ideations , no homicidal or violent ideations, denies hallucinations, no delusions. He is future oriented and wanting to go to Rehab at discharge  Denies medication side effects. Behavior on unit calm, no psychomotor agitation. Patient has been concerned about not having any clothes but he received some clothes via our social worker and is less concerned about this.   Cognitive Features That Contribute To Risk:  No gross cognitive deficits noted upon discharge. Is alert , attentive, and oriented x 3    Suicide Risk:  Mild:  Suicidal ideation of limited frequency, intensity, duration, and specificity.  There are no identifiable plans, no associated intent, mild dysphoria and related symptoms, good self-control (both objective and subjective assessment), few other risk factors, and identifiable protective factors,  including available and accessible social support.  Follow-up Information    Services, [facility]. Go on 09/20/2017.   Why:  Please attend your intake appt on Monday,  09/20/17, at 8:00am. Please bring proof of [facility] residence. Contact information: [address] [place] [address] [phone]        [facility]. Go on 09/22/2017.   Why:  Please attend your intake appt on Wednesday, 09/22/17, at 8:15am.  Please bring photo ID, social security card, proof of any household income, and copy of your hospital discharge paperwork. Contact information: [address] [place] [address] [phone]           Plan Of Care/Follow-up recommendations:  Activity:  as tolerated  Diet:  regular Tests:  NA Other:  See below  Patient is reporting readiness for discharge.  Patient is scheduled to go to [facility] tomorrow in early AM. Follow up as above .  Craige Cotta, MD 09/19/2017, 4,50 PM

## 2017-09-19 NOTE — Progress Notes (Signed)
Adult Psychoeducational Group Note  Date:  09/19/2017 Time:  10:39 PM  Group Topic/Focus:  Wrap-Up Group:   The focus of this group is to help patients review their daily goal of treatment and discuss progress on daily workbooks.  Participation Level:  Active  Participation Quality:  Appropriate and Attentive  Affect:  Appropriate  Cognitive:  Alert and Appropriate  Insight: Appropriate  Engagement in Group:  Engaged  Modes of Intervention:  Discussion, Education, Socialization and Support  Additional Comments:  Pt rated his day at a 9 out of 10. Pt's goal was to plan for discharge and will be leaving in the morning. When asked to share what he has learned from being in the hospital, pt stated "you have to take care of yourself before anyone else".  Larry Adams 09/19/2017, 10:39 PM

## 2017-09-19 NOTE — Progress Notes (Signed)
D Patient is observed OOB UAL on the 400 hall today. HE endorses a flat, irritable affect. HE avoids eye contact and he becomes visibly ( more) irritable when this writer woke him up ( accompanied by Dr. Jama Flavors) to ask him how he was feeling and doing... A HE completed his daily assessment and on it he wrote he denied SI today and he rated his depression, hopelessness and anxiety " 0/0/0", respectively. A Per LCSW ( MGO), bus ticket from [facility] and cab voucher placed on  Patient's  chart ( so that pt can [facility], go to his mother's house and pick up his clothing and then go to [facility]. . He says " I hope I can do this" and he is given pos reinforcement that his positive thought process is a result of his pos thinking.

## 2017-09-19 NOTE — Progress Notes (Signed)
D.  Pt pleasant on approach, anxious about discharge in the AM.  Pt is planing to go to his house to pick up some clothes and then go to [facility].  Pt was positive for evening wrap up group, has been interacting appropriately with peers on the unit.  Pt denies complaints at this time other than chronic back pain.  Pt denies SI/HI/AVH at this time.  A.  Support and encouragement offered, medication given as ordered  R. Pt remains safe on the unit, will continue to monitor.

## 2017-09-19 NOTE — [facility] Group Notes (Signed)
[facility] LCSW Group Therapy Note  Date/Time:  09/19/2017 9:00-10:00 or 10:00-11:00AM  Type of Therapy and Topic:  Group Therapy:  Healthy and Unhealthy Supports  Participation Level:  Active   Description of Group:  Patients in this group were introduced to the idea of adding a variety of healthy supports to address the various needs in their lives.Patients discussed what additional healthy supports could be helpful in their recovery and wellness after discharge in order to prevent future hospitalizations.   An emphasis was placed on using counselor, doctor, therapy groups, 12-step groups, and problem-specific support groups to expand supports.  They also worked as a group on developing a specific plan for several patients to deal with unhealthy supports through boundary-setting, psychoeducation with loved ones, and even termination of relationships.   Therapeutic Goals:   1)  discuss importance of adding supports to stay well once out of the hospital  2)  compare healthy versus unhealthy supports and identify some examples of each  3)  generate ideas and descriptions of healthy supports that can be added  4)  offer mutual support about how to address unhealthy supports  5)  encourage active participation in and adherence to discharge plan    Summary of Patient Progress:  The patient stated that current healthy supports in his life are nonexistent while current unhealthy supports include his mother and father who are "drunks" and the rest of his family that is "distant."  The patient expressed a willingness to add rehab, Narcotics Anonymous, a sponsor, and a conversation with his boss about the need to go to his follow-up medical/doctor appointments as support(s) to help in his recovery journey.   Therapeutic Modalities:   Motivational Interviewing Brief Solution-Focused Therapy  Ambrose Mantle, LCSW

## 2017-09-19 NOTE — [facility] Group Notes (Signed)
Pt did not attend Orientation/Goals group this morning. 

## 2017-09-19 NOTE — Progress Notes (Signed)
Adult Psychoeducational Group Note  Date:  09/19/2017 Time:  4:00 AM  Group Topic/Focus:  Wrap-Up Group:   The focus of this group is to help patients review their daily goal of treatment and discuss progress on daily workbooks.  Participation Level:  Active  Participation Quality:  Appropriate  Affect:  Appropriate  Cognitive:  Appropriate  Insight: Appropriate  Engagement in Group:  Engaged  Modes of Intervention:  Discussion  Additional Comments:  Pt stated his goal for today was to figure out how he would received his clothes and belongings from the ED. Pt stated he accomplished his goal. Pt rated his over all day a 9 out of 10. Pt stated tomorrow goal is to attend all groups held.  Felipa Furnace 09/19/2017, 4:00 AM

## 2017-09-20 NOTE — Progress Notes (Signed)
Discharge note:  Pt requested to go to Lobby to wait for Taxi.  All discharge paperwork signed and all were gone over with Pt who verbalized understanding.  Pt received discharge paperwork, prescriptions, medication samples, Taxi voucher, and bus pass.  Pt denied questions at this time.  Pt denied SI/HI at this time.  Pt discharged to lobby awaiting cab which will be here at 0630.

## 2018-11-03 ENCOUNTER — Emergency Department (HOSPITAL_COMMUNITY)
Admission: EM | Admit: 2018-11-03 | Discharge: 2018-11-03 | Disposition: A | Discharge status: Home / Self Care | Payer: Self-pay | Attending: Emergency Medicine | Admitting: Emergency Medicine

## 2018-11-03 ENCOUNTER — Encounter (HOSPITAL_COMMUNITY): Payer: Self-pay | Admitting: Emergency Medicine

## 2018-11-03 ENCOUNTER — Other Ambulatory Visit: Payer: Self-pay

## 2018-11-03 DIAGNOSIS — Z79899 Other long term (current) drug therapy: Secondary | ICD-10-CM | POA: Insufficient documentation

## 2018-11-03 DIAGNOSIS — F17210 Nicotine dependence, cigarettes, uncomplicated: Secondary | ICD-10-CM | POA: Insufficient documentation

## 2018-11-03 DIAGNOSIS — F1110 Opioid abuse, uncomplicated: Secondary | ICD-10-CM | POA: Insufficient documentation

## 2018-11-03 MED ORDER — NALOXONE HCL 4 MG/0.1ML NA LIQD: NASAL | 0 refills | Status: DC

## 2018-11-03 NOTE — ED Provider Notes (Signed)
[facility]-EMERGENCY DEPT Provider Note   CSN: [id number] Arrival date & time: 11/03/18  1228    History   Chief Complaint Chief Complaint  Patient presents with  . heroin injestion    HPI Larry Adams is a 33 y.o. male.     33 year old male with prior medical history as detailed below presents for evaluation following reported use of heroin.  Patient reports that approximately half an hour ago he snorted some heroin.  He was afraid that he had "done too much."  He called EMS who transferred him to the ED for evaluation.  He did not use other drugs.  He did not have loss of consciousness.  He did not use Narcan.  EMS did not administer Narcan.  He is now comfortable without any evidence of significant distress.  He reports that he snorts heroin on a regular basis.  He does have Narcan at home, however, he did not use it today.  The history is provided by the patient and medical records.  Illness  Location:  Heroin abuse, suspected overdose Severity:  Mild Onset quality:  Sudden Duration:  30 minutes Timing:  Sporadic Chronicity:  Recurrent   Past Medical History:  Diagnosis Date  . Anxiety   . Back pain   . Mental disorder     Patient Active Problem List   Diagnosis Date Noted  . Alcohol abuse with alcohol-induced mood disorder (HCC) 09/12/2017  . MDD (major depressive disorder), recurrent severe, without psychosis (HCC) 09/12/2017  . Benzodiazepine dependence (HCC) 07/23/2014  . Alcohol abuse 07/23/2014  . Substance induced mood disorder (HCC) 07/23/2014  . 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 HERNIA REPAIR          Home Medications    Prior to Admission medications   Medication Sig Start Date End Date Taking? Authorizing Provider  gabapentin (NEURONTIN) 300 MG capsule Take 1 capsule (300 mg total) by mouth 3 (three) times daily. 09/17/17   Oneta Rack, NP   mirtazapine (REMERON) 15 MG tablet Take 1 tablet (15 mg total) by mouth at bedtime. 09/17/17   Oneta Rack, NP  traZODone (DESYREL) 50 MG tablet Take 1 tablet (50 mg total) by mouth at bedtime as needed for sleep. 09/19/17   Oneta Rack, NP  venlafaxine XR (EFFEXOR-XR) 75 MG 24 hr capsule Take 1 capsule (75 mg total) by mouth daily with breakfast. 09/18/17   Oneta Rack, NP    Family History No family history on file.  Social History Social History   Tobacco Use  . Smoking status: Current Every Day Smoker    Packs/day: 1.00    Types: Cigarettes  . Smokeless tobacco: Former Engineer, water Use Topics  . Alcohol use: Yes    Comment: pint of liquor a day; varies  . Drug use: Yes    Frequency: 5.0 times per week    Types: Benzodiazepines, IV    Comment: reports that he is on klonopin and xanax; hx of marijuana use but drug screen presently negative     Allergies   Morphine and related   Review of Systems Review of Systems  All other systems reviewed and are negative.    Physical Exam Updated Vital Signs SpO2 98%   Physical Exam Vitals signs and nursing note reviewed.  Constitutional:      General: He is not in acute distress.    Appearance: Normal appearance. He  is well-developed.  HENT:     Head: Normocephalic and atraumatic.  Eyes:     Conjunctiva/sclera: Conjunctivae normal.     Pupils: Pupils are equal, round, and reactive to light.  Neck:     Musculoskeletal: Normal range of motion and neck supple.  Cardiovascular:     Rate and Rhythm: Normal rate and regular rhythm.     Heart sounds: Normal heart sounds.  Pulmonary:     Effort: Pulmonary effort is normal. No respiratory distress.     Breath sounds: Normal breath sounds.  Abdominal:     General: There is no distension.     Palpations: Abdomen is soft.     Tenderness: There is no abdominal tenderness.  Musculoskeletal: Normal range of motion.        General: No deformity.  Skin:    General:  Skin is warm and dry.  Neurological:     Mental Status: He is alert and oriented to person, place, and time.      ED Treatments / Results  Labs (all labs ordered are listed, but only abnormal results are displayed) Labs Reviewed - No data to display  EKG None  Radiology No results found.  Procedures Procedures (including critical care time)  Medications Ordered in ED Medications - No data to display   Initial Impression / Assessment and Plan / ED Course  I have reviewed the triage vital signs and the nursing notes.  Pertinent labs & imaging results that were available during my care of the patient were reviewed by me and considered in my medical decision making (see chart for details).        MDM  Screen complete  Larry Adams was evaluated in Emergency Department on 11/03/2018 for the symptoms described in the history of present illness. He was evaluated in the context of the global COVID-19 pandemic, which necessitated consideration that the patient might be at risk for infection with the SARS-CoV-2 virus that causes COVID-19. Institutional protocols and algorithms that pertain to the evaluation of patients at risk for COVID-19 are in a state of rapid change based on information released by regulatory bodies including the CDC and federal and state organizations. These policies and algorithms were followed during the patient's care in the ED.  Patient is presenting for evaluation following suspected heroin overdose.  Patient did not require Narcan.  Patient was observed and is without evidence of significant respiratory depression or other significant effect.  Patient is appropriate for discharge.  He is advised to abstain from further use of heroin.  He is advised that heroin use could result in his untimely death.  Importance of close follow-up is stressed.  Strict return precautions given and understood.  Final Clinical Impressions(s) / ED Diagnoses   Final  diagnoses:  Heroin abuse [facility])    ED Discharge Orders         Ordered    naloxone [facility]) nasal spray 4 mg/0.1 mL     11/03/18 1323           Wynetta Fines, MD 11/03/18 1325

## 2018-11-03 NOTE — ED Notes (Signed)
Bed: OE32 Expected date:  Expected time:  Means of arrival:  Comments: EMS-heroin ingestion

## 2018-11-03 NOTE — ED Triage Notes (Signed)
Per EMS: Pt snorted heroin at 11:30.  Pt thinks he took too much, and was scared he would OD so he called 911.

## 2018-11-03 NOTE — ED Notes (Signed)
Patient given discharge teaching and verbalized understanding. Patient ambulated out of ED with a steady gait. 

## 2018-11-03 NOTE — Discharge Instructions (Addendum)
Please return for any problem.  Follow-up with your regular care provider as instructed. °

## 2018-11-05 ENCOUNTER — Encounter (HOSPITAL_COMMUNITY): Payer: Self-pay

## 2018-11-05 ENCOUNTER — Emergency Department (HOSPITAL_COMMUNITY)
Admission: EM | Admit: 2018-11-05 | Discharge: 2018-11-06 | Disposition: A | Discharge status: Other Acute Inpatient Hospital | Payer: Self-pay | Attending: Emergency Medicine | Admitting: Emergency Medicine

## 2018-11-05 ENCOUNTER — Other Ambulatory Visit: Payer: Self-pay

## 2018-11-05 DIAGNOSIS — Z79899 Other long term (current) drug therapy: Secondary | ICD-10-CM | POA: Insufficient documentation

## 2018-11-05 DIAGNOSIS — T401X1A Poisoning by heroin, accidental (unintentional), initial encounter: Secondary | ICD-10-CM | POA: Insufficient documentation

## 2018-11-05 DIAGNOSIS — Z1159 Encounter for screening for other viral diseases: Secondary | ICD-10-CM | POA: Insufficient documentation

## 2018-11-05 DIAGNOSIS — F329 Major depressive disorder, single episode, unspecified: Secondary | ICD-10-CM | POA: Insufficient documentation

## 2018-11-05 DIAGNOSIS — F1910 Other psychoactive substance abuse, uncomplicated: Secondary | ICD-10-CM | POA: Insufficient documentation

## 2018-11-05 DIAGNOSIS — F17210 Nicotine dependence, cigarettes, uncomplicated: Secondary | ICD-10-CM | POA: Insufficient documentation

## 2018-11-05 DIAGNOSIS — Z046 Encounter for general psychiatric examination, requested by authority: Secondary | ICD-10-CM | POA: Insufficient documentation

## 2018-11-05 HISTORY — DX: Poisoning by unspecified drugs, medicaments and biological substances, accidental (unintentional), initial encounter: T50.901A

## 2018-11-05 LAB — CBC WITH DIFFERENTIAL/PLATELET
Abs Immature Granulocytes: 0.03 10*3/uL (ref 0.00–0.07)
Basophils Absolute: 0 10*3/uL (ref 0.0–0.1)
Basophils Relative: 1 %
Eosinophils Absolute: 0 10*3/uL (ref 0.0–0.5)
Eosinophils Relative: 1 %
HCT: 48.5 % (ref 39.0–52.0)
Hemoglobin: 16.1 g/dL (ref 13.0–17.0)
Immature Granulocytes: 0 %
Lymphocytes Relative: 16 %
Lymphs Abs: 1.4 10*3/uL (ref 0.7–4.0)
MCH: 31.4 pg (ref 26.0–34.0)
MCHC: 33.2 g/dL (ref 30.0–36.0)
MCV: 94.7 fL (ref 80.0–100.0)
Monocytes Absolute: 0.8 10*3/uL (ref 0.1–1.0)
Monocytes Relative: 9 %
Neutro Abs: 6.3 10*3/uL (ref 1.7–7.7)
Neutrophils Relative %: 73 %
Platelets: 200 10*3/uL (ref 150–400)
RBC: 5.12 MIL/uL (ref 4.22–5.81)
RDW: 13.9 % (ref 11.5–15.5)
WBC: 8.5 10*3/uL (ref 4.0–10.5)
nRBC: 0 % (ref 0.0–0.2)

## 2018-11-05 LAB — COMPREHENSIVE METABOLIC PANEL
ALT: 82 U/L — ABNORMAL HIGH (ref 0–44)
AST: 64 U/L — ABNORMAL HIGH (ref 15–41)
Albumin: 3.7 g/dL (ref 3.5–5.0)
Alkaline Phosphatase: 65 U/L (ref 38–126)
Anion gap: 11 (ref 5–15)
BUN: 9 mg/dL (ref 6–20)
CO2: 23 mmol/L (ref 22–32)
Calcium: 9 mg/dL (ref 8.9–10.3)
Chloride: 111 mmol/L (ref 98–111)
Creatinine, Ser: 0.98 mg/dL (ref 0.61–1.24)
GFR calc Af Amer: >60 mL/min (ref >60)
GFR calc non Af Amer: >60 mL/min (ref >60)
Glucose, Bld: 92 mg/dL (ref 70–99)
Potassium: 4.1 mmol/L (ref 3.5–5.1)
Sodium: 145 mmol/L (ref 135–145)
Total Bilirubin: 1 mg/dL (ref 0.3–1.2)
Total Protein: 6.7 g/dL (ref 6.5–8.1)

## 2018-11-05 LAB — RAPID URINE DRUG SCREEN, HOSP PERFORMED
Amphetamines: NOT DETECTED
Barbiturates: NOT DETECTED
Benzodiazepines: NOT DETECTED
Cocaine: POSITIVE — AB
Opiates: POSITIVE — AB
Tetrahydrocannabinol: NOT DETECTED

## 2018-11-05 LAB — ETHANOL: Alcohol, Ethyl (B): 100 mg/dL — ABNORMAL HIGH (ref <10)

## 2018-11-05 MED ORDER — ONDANSETRON HCL 4 MG PO TABS: 4.0000 mg | ORAL_TABLET | Freq: Three times a day (TID) | ORAL | Status: DC | PRN

## 2018-11-05 MED ORDER — ZOLPIDEM TARTRATE 5 MG PO TABS: 5.0000 mg | ORAL_TABLET | Freq: Every evening | ORAL | Status: DC | PRN

## 2018-11-05 MED ORDER — VITAMIN B-1 100 MG PO TABS: 100.0000 mg | ORAL_TABLET | Freq: Every day | ORAL | Status: DC

## 2018-11-05 MED ORDER — THIAMINE HCL 100 MG/ML IJ SOLN: 100.0000 mg | Freq: Every day | INTRAMUSCULAR | Status: DC

## 2018-11-05 MED ORDER — LORAZEPAM 2 MG/ML IJ SOLN: 0.0000 mg | Freq: Four times a day (QID) | INTRAMUSCULAR | Status: DC

## 2018-11-05 MED ORDER — ACETAMINOPHEN 325 MG PO TABS: 650.0000 mg | ORAL_TABLET | ORAL | Status: DC | PRN

## 2018-11-05 MED ORDER — LORAZEPAM 2 MG/ML IJ SOLN: 0.0000 mg | Freq: Two times a day (BID) | INTRAMUSCULAR | Status: DC

## 2018-11-05 MED ORDER — NICOTINE 21 MG/24HR TD PT24: 21.0000 mg | MEDICATED_PATCH | Freq: Every day | TRANSDERMAL | Status: DC

## 2018-11-05 MED ORDER — LORAZEPAM 1 MG PO TABS: 0.0000 mg | ORAL_TABLET | Freq: Four times a day (QID) | ORAL | Status: DC

## 2018-11-05 MED ORDER — ALUM & MAG HYDROXIDE-SIMETH 200-200-20 MG/5ML PO SUSP: 30.0000 mL | Freq: Four times a day (QID) | ORAL | Status: DC | PRN

## 2018-11-05 MED ORDER — MIRTAZAPINE 7.5 MG PO TABS
15.0000 mg | ORAL_TABLET | Freq: Every day | ORAL | Status: DC
  Administered 2018-11-05: 15 mg via ORAL
  Filled 2018-11-05: qty 2

## 2018-11-05 MED ORDER — VENLAFAXINE HCL ER 75 MG PO CP24: 75.0000 mg | ORAL_CAPSULE | Freq: Every day | ORAL | Status: DC

## 2018-11-05 MED ORDER — LORAZEPAM 1 MG PO TABS: 0.0000 mg | ORAL_TABLET | Freq: Two times a day (BID) | ORAL | Status: DC

## 2018-11-05 NOTE — ED Notes (Signed)
Patient made aware that urine sample is needed.  

## 2018-11-05 NOTE — ED Triage Notes (Signed)
He was found to be unconscious in the bathroom of a local fast-food restaurant. EMS were notified and they found pt. To be apneic and cyanotic upon their arrival. They administered 2mg  of intranasal Narcan and pt. Awakened. He arrives in ED awake and in no distress. He is oriented x 4 with clear speech.

## 2018-11-05 NOTE — ED Notes (Signed)
Bed: YQ03 Expected date:  Expected time:  Means of arrival:  Comments: 33 yo Heroin OD, narcan given

## 2018-11-05 NOTE — ED Provider Notes (Signed)
33 year old male received at sign out from [facility] pending TTS consult. Per his HPI:  "33 year old male with significant history of heroin abuse brought here via EMS for suspected unintentional overdose.  Patient was found unconscious in the bathroom at a local fast food restaurant.  EMS was notified and found patient to be apneic and cyanotic upon arrival.  Patient was given 2 mg of intranasal Narcan and he subsequently became oriented.  At this time, patient does not have any specific complaint aside from mild nausea.  He admits that he has been relapsing.  States that he was at a facility for several months for detox and was released 5 days ago.  He thought that he was able to fight off his urge but unsuccessfully.  He was seen in ED 2 days ago for drug overdose with heroin and subsequently sent home.  He admits to using it again today including heroin and crack.  He does not complain of any SI HI, auditory or visual examination.  He does not have any significant pain at this time.  He felt that he is in desperate need of help with detox because he is not ready to go back to the street in fear of losing his life to drugs."  Physical Exam  BP 105/73   Pulse 91   Temp 98.7 F (37.1 C) (Oral)   Resp 20   Ht 6\' 1"  (1.854 m)   Wt 99.8 kg   SpO2 96%   BMI 29.03 kg/m   Physical Exam Vitals signs and nursing note reviewed.  Constitutional:      General: He is not in acute distress.    Appearance: He is well-developed. He is not ill-appearing, toxic-appearing or diaphoretic.  HENT:     Head: Normocephalic.  Eyes:     Conjunctiva/sclera: Conjunctivae normal.  Neck:     Musculoskeletal: Neck supple.  Cardiovascular:     Rate and Rhythm: Normal rate and regular rhythm.  Pulmonary:     Effort: Pulmonary effort is normal.     Comments: Good inspiratory effort. Abdominal:     General: There is no distension.     Palpations: Abdomen is soft.  Skin:    General: Skin is warm and dry.   Neurological:     Mental Status: He is alert.     Deep Tendon Reflexes: Reflexes normal.     Comments: Alert.  Answers questions in complete, fluent sentences.  Moves all 4 extremities.  Psychiatric:        Behavior: Behavior normal.     ED Course/Procedures     Procedures  MDM   33 year old male with history of heroin abuse received at signout from [facility] pending TTS consult.  Please see his note for further work-up and medical decision making.  Patient was previously medically cleared prior to the time that I assumed care for him.  On my evaluation, he is sitting up in bed.  He is requesting something to eat.  He has no complaints at this time.  TTS was consulted and patient meets criteria for inpatient dual diagnosis treatment.  He has a bed confirmed at [facility]  Psych hold orders have been placed.  Patient has no home medications to order at this time.  At this time, the patient remains hemodynamically stable.  He is in no acute distress.  He is safe for transfer to [facility] at this time.      Frederik Pear A, PA-C 11/06/18 [phone]  Ward, Layla Maw, DO 11/06/18 [phone]

## 2018-11-05 NOTE — ED Provider Notes (Signed)
[facility]-EMERGENCY DEPT Provider Note   CSN: [id number] Arrival date & time: 11/05/18  1832    History   Chief Complaint Chief Complaint  Patient presents with  . Drug Overdose    HPI Larry Adams is a 33 y.o. male.     The history is provided by the patient and medical records. No language interpreter was used.  Drug Overdose      33 year old male with significant history of heroin abuse brought here via EMS for suspected unintentional overdose.  Patient was found unconscious in the bathroom at a local fast food restaurant.  EMS was notified and found patient to be apneic and cyanotic upon arrival.  Patient was given 2 mg of intranasal Narcan and he subsequently became oriented.  At this time, patient does not have any specific complaint aside from mild nausea.  He admits that he has been relapsing.  States that he was at a facility for several months for detox and was released 5 days ago.  He thought that he was able to fight off his urge but unsuccessfully.  He was seen in ED 2 days ago for drug overdose with heroin and subsequently sent home.  He admits to using it again today including heroin and crack.  He does not complain of any SI HI, auditory or visual examination.  He does not have any significant pain at this time.  He felt that he is in desperate need of help with detox because he is not ready to go back to the street in fear of losing his life to drugs.  Past Medical History:  Diagnosis Date  . Anxiety   . Back pain   . Drug overdose   . Mental disorder     Patient Active Problem List   Diagnosis Date Noted  . Alcohol abuse with alcohol-induced mood disorder (HCC) 09/12/2017  . MDD (major depressive disorder), recurrent severe, without psychosis (HCC) 09/12/2017  . Benzodiazepine dependence (HCC) 07/23/2014  . Alcohol abuse 07/23/2014  . Substance induced mood disorder (HCC) 07/23/2014  . Substance-induced anxiety disorder (HCC)  07/23/2014    Past Surgical History:  Procedure Laterality Date  . HERNIA REPAIR          Home Medications    Prior to Admission medications   Medication Sig Start Date End Date Taking? Authorizing Provider  gabapentin (NEURONTIN) 300 MG capsule Take 1 capsule (300 mg total) by mouth 3 (three) times daily. 09/17/17   Oneta Rack, NP  mirtazapine (REMERON) 15 MG tablet Take 1 tablet (15 mg total) by mouth at bedtime. 09/17/17   Oneta Rack, NP  naloxone [facility]) nasal spray 4 mg/0.1 mL Take as needed after suspected heroin overdose 11/03/18   Wynetta Fines, MD  traZODone (DESYREL) 50 MG tablet Take 1 tablet (50 mg total) by mouth at bedtime as needed for sleep. 09/19/17   Oneta Rack, NP  venlafaxine XR (EFFEXOR-XR) 75 MG 24 hr capsule Take 1 capsule (75 mg total) by mouth daily with breakfast. 09/18/17   Oneta Rack, NP    Family History No family history on file.  Social History Social History   Tobacco Use  . Smoking status: Current Every Day Smoker    Packs/day: 1.00    Types: Cigarettes  . Smokeless tobacco: Former Engineer, water Use Topics  . Alcohol use: Yes    Comment: pint of liquor a day; varies  . Drug use: Yes    Frequency: 5.0  times per week    Types: Benzodiazepines, IV    Comment: reports that he is on klonopin and xanax; hx of marijuana use but drug screen presently negative     Allergies   Morphine and related   Review of Systems Review of Systems  All other systems reviewed and are negative.    Physical Exam Updated Vital Signs BP 110/77 (BP Location: Left Arm)   Pulse (!) 107   Temp 98.7 F (37.1 C) (Oral)   Resp 14   SpO2 100%   Physical Exam Vitals signs and nursing note reviewed.  Constitutional:      General: He is not in acute distress.    Appearance: He is well-developed.     Comments: Patient appears disheveled  HENT:     Head: Normocephalic and atraumatic.  Eyes:     Conjunctiva/sclera: Conjunctivae normal.   Neck:     Musculoskeletal: Neck supple.  Cardiovascular:     Rate and Rhythm: Normal rate and regular rhythm.     Pulses: Normal pulses.     Heart sounds: Normal heart sounds.  Pulmonary:     Breath sounds: Normal breath sounds. No wheezing.  Abdominal:     Palpations: Abdomen is soft.     Tenderness: There is no abdominal tenderness.  Skin:    Findings: No rash.  Neurological:     Mental Status: He is alert and oriented to person, place, and time.  Psychiatric:        Mood and Affect: Mood normal.      ED Treatments / Results  Labs (all labs ordered are listed, but only abnormal results are displayed) Labs Reviewed  COMPREHENSIVE METABOLIC PANEL - Abnormal; Notable for the following components:      Result Value   AST 64 (*)    ALT 82 (*)    All other components within normal limits  RAPID URINE DRUG SCREEN, HOSP PERFORMED - Abnormal; Notable for the following components:   Opiates POSITIVE (*)    Cocaine POSITIVE (*)    All other components within normal limits  ETHANOL - Abnormal; Notable for the following components:   Alcohol, Ethyl (B) 100 (*)    All other components within normal limits  CBC WITH DIFFERENTIAL/PLATELET    EKG None  Radiology No results found.  Procedures Procedures (including critical care time)  Medications Ordered in ED Medications  LORazepam (ATIVAN) injection 0-4 mg (has no administration in time range)    Or  LORazepam (ATIVAN) tablet 0-4 mg (has no administration in time range)  LORazepam (ATIVAN) injection 0-4 mg (has no administration in time range)    Or  LORazepam (ATIVAN) tablet 0-4 mg (has no administration in time range)  thiamine (VITAMIN B-1) tablet 100 mg (has no administration in time range)    Or  thiamine (B-1) injection 100 mg (has no administration in time range)  acetaminophen (TYLENOL) tablet 650 mg (has no administration in time range)  zolpidem (AMBIEN) tablet 5 mg (has no administration in time range)   ondansetron (ZOFRAN) tablet 4 mg (has no administration in time range)  alum & mag hydroxide-simeth (MAALOX/MYLANTA) 200-200-20 MG/5ML suspension 30 mL (has no administration in time range)  nicotine (NICODERM CQ - dosed in mg/24 hours) patch 21 mg (has no administration in time range)  venlafaxine XR (EFFEXOR-XR) 24 hr capsule 75 mg (has no administration in time range)  mirtazapine (REMERON) tablet 15 mg (has no administration in time range)     Initial Impression /  Assessment and Plan / ED Course  I have reviewed the triage vital signs and the nursing notes.  Pertinent labs & imaging results that were available during my care of the patient were reviewed by me and considered in my medical decision making (see chart for details).        BP 111/79 (BP Location: Left Arm)   Pulse 96   Temp 98.7 F (37.1 C) (Oral)   Resp 20   SpO2 100%    Final Clinical Impressions(s) / ED Diagnoses   Final diagnoses:  Accidental overdose of heroin, initial encounter (HCC)  Polysubstance abuse [facility])    ED Discharge Orders    None     7:52 PM Patient with significant history of heroin abuse who reportedly went through rehab for several months, released 5 days ago and is here for unintentional drug overdose requiring Narcan.  He was also seen in the ED 2 days ago for same.  He felt that he is not able to fight off his addiction and requesting help with rehab.  Will consult TTS given recurrent accidental overdose, increase risk of life threatening drug overdose.  10:49 PM Pt is medically cleared, will awaits TTS to reassess pt and help with disposition.   Larry Adams was evaluated in Emergency Department on 11/05/2018 for the symptoms described in the history of present illness. He was evaluated in the context of the global COVID-19 pandemic, which necessitated consideration that the patient might be at risk for infection with the SARS-CoV-2 virus that causes COVID-19. Institutional protocols  and algorithms that pertain to the evaluation of patients at risk for COVID-19 are in a state of rapid change based on information released by regulatory bodies including the CDC and federal and state organizations. These policies and algorithms were followed during the patient's care in the ED.    Fayrene Helper, PA-C 11/05/18 2349    Tegeler, Canary Brim, MD 11/06/18 [phone]

## 2018-11-06 ENCOUNTER — Inpatient Hospital Stay (HOSPITAL_COMMUNITY)
Admission: AD | Admit: 2018-11-06 | Discharge: 2018-11-10 | DRG: 885 | Disposition: A | Discharge status: Rehabilitation Facility | Payer: No Typology Code available for payment source | Source: Intra-hospital | Attending: Psychiatry | Admitting: Psychiatry

## 2018-11-06 ENCOUNTER — Encounter (HOSPITAL_COMMUNITY): Payer: Self-pay

## 2018-11-06 DIAGNOSIS — F332 Major depressive disorder, recurrent severe without psychotic features: Principal | ICD-10-CM | POA: Diagnosis present

## 2018-11-06 DIAGNOSIS — F10239 Alcohol dependence with withdrawal, unspecified: Secondary | ICD-10-CM | POA: Diagnosis present

## 2018-11-06 DIAGNOSIS — T401X2A Poisoning by heroin, intentional self-harm, initial encounter: Secondary | ICD-10-CM | POA: Diagnosis present

## 2018-11-06 DIAGNOSIS — F1120 Opioid dependence, uncomplicated: Secondary | ICD-10-CM | POA: Diagnosis present

## 2018-11-06 DIAGNOSIS — F17210 Nicotine dependence, cigarettes, uncomplicated: Secondary | ICD-10-CM | POA: Diagnosis present

## 2018-11-06 DIAGNOSIS — Y905 Blood alcohol level of 100-119 mg/100 ml: Secondary | ICD-10-CM | POA: Diagnosis present

## 2018-11-06 LAB — SARS CORONAVIRUS 2 BY RT PCR (HOSPITAL ORDER, PERFORMED IN ~~LOC~~ HOSPITAL LAB): SARS Coronavirus 2: NEGATIVE

## 2018-11-06 MED ORDER — ADULT MULTIVITAMIN W/MINERALS CH
1.0000 | ORAL_TABLET | Freq: Every day | ORAL | Status: DC
  Administered 2018-11-07 – 2018-11-09 (×3): 1 via ORAL
  Filled 2018-11-06 (×6): qty 1

## 2018-11-06 MED ORDER — LORAZEPAM 1 MG PO TABS: 1.0000 mg | ORAL_TABLET | Freq: Four times a day (QID) | ORAL | Status: AC | PRN

## 2018-11-06 MED ORDER — CLONIDINE HCL 0.1 MG PO TABS
0.1000 mg | ORAL_TABLET | ORAL | Status: DC
  Administered 2018-11-08: 0.1 mg via ORAL
  Filled 2018-11-06 (×4): qty 1

## 2018-11-06 MED ORDER — LOPERAMIDE HCL 2 MG PO CAPS: 2.0000 mg | ORAL_CAPSULE | ORAL | Status: AC | PRN

## 2018-11-06 MED ORDER — TRAZODONE HCL 50 MG PO TABS
50.0000 mg | ORAL_TABLET | Freq: Every evening | ORAL | Status: DC | PRN
  Administered 2018-11-06 – 2018-11-08 (×3): 50 mg via ORAL
  Filled 2018-11-06 (×6): qty 1

## 2018-11-06 MED ORDER — ONDANSETRON 4 MG PO TBDP: 4.0000 mg | ORAL_TABLET | Freq: Four times a day (QID) | ORAL | Status: AC | PRN

## 2018-11-06 MED ORDER — CLONIDINE HCL 0.1 MG PO TABS
0.1000 mg | ORAL_TABLET | Freq: Four times a day (QID) | ORAL | Status: AC
  Administered 2018-11-06 – 2018-11-07 (×6): 0.1 mg via ORAL
  Filled 2018-11-06 (×8): qty 1

## 2018-11-06 MED ORDER — CLONIDINE HCL 0.1 MG PO TABS
0.1000 mg | ORAL_TABLET | Freq: Every day | ORAL | Status: DC
  Filled 2018-11-06 (×2): qty 1

## 2018-11-06 MED ORDER — NICOTINE 21 MG/24HR TD PT24
21.0000 mg | MEDICATED_PATCH | Freq: Every day | TRANSDERMAL | Status: DC
  Administered 2018-11-06 – 2018-11-09 (×4): 21 mg via TRANSDERMAL
  Filled 2018-11-06 (×7): qty 1

## 2018-11-06 MED ORDER — HYDROXYZINE HCL 25 MG PO TABS
25.0000 mg | ORAL_TABLET | Freq: Four times a day (QID) | ORAL | Status: AC | PRN
  Administered 2018-11-06 – 2018-11-08 (×2): 25 mg via ORAL
  Filled 2018-11-06 (×3): qty 1

## 2018-11-06 MED ORDER — IBUPROFEN 600 MG PO TABS: 600.0000 mg | ORAL_TABLET | Freq: Four times a day (QID) | ORAL | Status: DC | PRN

## 2018-11-06 MED ORDER — VITAMIN B-1 100 MG PO TABS
100.0000 mg | ORAL_TABLET | Freq: Every day | ORAL | Status: DC
  Administered 2018-11-07 – 2018-11-09 (×3): 100 mg via ORAL
  Filled 2018-11-06 (×5): qty 1

## 2018-11-06 NOTE — Progress Notes (Signed)
Received Larry Adams from the main ED, he immediately went to bed. No noted distress. He spoke with TTS on the phone, shortly thereafter this writer was notified he has a bed at [facility], 306-1.  His coronavirus result is negative. Report was called to nurse Alvira Philips transporter was call and arrived to pick him up at 0500 hrs without incident.

## 2018-11-06 NOTE — Progress Notes (Signed)
   11/06/18 2326  COVID-19 Daily Checkoff  Have you had a fever (temp > 37.80C/100F)  in the past 24 hours?  No  COVID-19 EXPOSURE  Have you traveled outside the state in the past 14 days? No  Have you been in contact with someone with a confirmed diagnosis of COVID-19 or PUI in the past 14 days without wearing appropriate PPE? No  Have you been living in the same home as a person with confirmed diagnosis of COVID-19 or a PUI (household contact)? No  Have you been diagnosed with COVID-19? No

## 2018-11-06 NOTE — Progress Notes (Signed)
D: Pt was in the dayroom upon initial approach.  Pt presents with anxious, depressed affect and mood.  He reports his day was "all right" and goal is to "adjust."  Pt denies SI/HI, denies hallucinations.  Pt has been visible in milieu interacting with peers and staff appropriately.   A: Introduced self to pt.  Met with pt 1:1.  Actively listened to pt and offered support and encouragement.  Medication administered per order.  PRN medication administered for anxiety and sleep.  Q15 minute safety checks maintained.  R: Pt is safe on the unit.  Pt is compliant with medications.  Pt verbally contracts for safety.  Will continue to monitor and assess.
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
## 2018-11-06 NOTE — BH Assessment (Signed)
Tele-cart is not operational. Contacted IT and submitted repair request.   Pamalee Leyden, [facility], [facility], [facility] Triage Specialist [phone]

## 2018-11-06 NOTE — Progress Notes (Signed)
Larry Adams is a 33 y.o. male Voluntary admitted for suicidal attempt by overdosing on heroin. Pt stated he replaced  after being in a facility for several months to detox. Pt alert and oriented during admission, but appeared drowsy. Consents signed, skin/belongings search completed and pt oriented to unit. Pt stable at this time. Pt given the opportunity to express concerns and ask questions. Pt given toiletries. Will continue to monitor.

## 2018-11-06 NOTE — Progress Notes (Signed)
Psychoeducational Group Note  Date:  11/06/2018 Time:  2030 Group Topic/Focus:  wrap up group  Participation Level: Did Not Attend  Participation Quality:  Not Applicable  Affect:  Not Applicable  Cognitive:  Not Applicable  Insight:  Not Applicable  Engagement in Group: Not Applicable  Additional Comments:  Pt remained sleeping in bed during group time.   Marcille Buffy 11/06/2018, 9:46 PM

## 2018-11-06 NOTE — [facility] Group Notes (Signed)
[facility] Group Notes:  (Nursing/)  Date:  11/06/2018  Time:  130 PM Type of Therapy:  Nurse Education  Participation Level:  Did Not Attend  Shela Nevin 11/06/2018, 7:00 PM

## 2018-11-06 NOTE — [facility] Group Notes (Signed)
[facility] LCSW Group Therapy Note  11/06/2018  10:00-11:00AM  Type of Therapy and Topic:  Group Therapy:  Acknowledging and Resolving Issues with Mothers  Participation Level:  Did Not Attend   Description of Group:   Patients in this group were asked to briefly describe their experience with the mother figure(s) in their lives, both in childhood and adulthood.  Different types of support provided by these individuals were identified.   Patients were then encouraged to determine whether their mother figure was or is a healthy or unhealthy support.  The manner in which that early relationship has shaped patient's feelings and life decisions was pointed out and acknowledged.  Group members gave support to each other.  CSW led a discussion on how helpful it can be to resolve past issues, and how this can be done whether the mother figure is now alive or already deceased.  An emphasis was placed on continuing to work with a therapist on these issues  when patients leave the hospital in order to be able to focus on the future instead of the past, to continue becoming healthier and happier.   Therapeutic Goals: 1)  discuss the possibility of mother figure(s) being positive and/or negative in one's life, normalizing that some people never had positive experiences with "maternal" persons  2)  describe patient's specific example of mother figure(s), allowing time to vent  3)  identify the patient's current need for resolution in the relationship with the aforementioned person  4)  elicit commitments to work on resolving feelings about mother figure(s) in order to move forward in life and wellness   Summary of Patient Progress:  N/A   Therapeutic Modalities:   Processing Brief Solution-Focused Therapy  Lou Loewe J Grossman-Orr       

## 2018-11-06 NOTE — Progress Notes (Signed)
D Pt is observed OOB on the 300 hall today. He is sleepy, appears disoriented and is directed by this staff. HE avoids making eye contact with this Clinical research associate. He wears hospital-issue patient scrubs that he sleeps in. HE does not attend groups, despite prompting from staff. He does not engage in conversation with his peers and / or staff today.     A HE did complete his daily assessment. On this he wrote he denied having any SI today and he rated his depression, hopelessness and anxeity " 6/3/4", respectively. He sleeps when he is not awake eating meals.       R Safeyt in plae.

## 2018-11-06 NOTE — ED Provider Notes (Signed)
2:20 AM D/w Ala Dach with behavioral health.  Patient has been accepted to behavioral health hospital.   Ward, Layla Maw, DO 11/06/18 0221

## 2018-11-06 NOTE — Tx Team (Signed)
Initial Treatment Plan 11/06/2018 6:10 AM Larry Adams PXT:[id number]    PATIENT STRESSORS: Financial difficulties Medication change or noncompliance Substance abuse   PATIENT STRENGTHS: Ability for insight Average or above average intelligence Supportive family/friends   PATIENT IDENTIFIED PROBLEMS: Substance abuse  Depression  " Feel better'  "getting into a program"               DISCHARGE CRITERIA:  Ability to meet basic life and health needs Adequate post-discharge living arrangements Improved stabilization in mood, thinking, and/or behavior Medical problems require only outpatient monitoring  PRELIMINARY DISCHARGE PLAN: Attend aftercare/continuing care group Attend PHP/IOP Attend 12-step recovery group Outpatient therapy  PATIENT/FAMILY INVOLVEMENT: This treatment plan has been presented to and reviewed with the patient, Larry Adams, and/or family member. The patient and family have been given the opportunity to ask questions and make suggestions.  Bethann Punches, RN 11/06/2018, 6:10 AM

## 2018-11-06 NOTE — Plan of Care (Signed)
  Problem: Coping: Goal: Coping ability will improve Outcome: Progressing   
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
## 2018-11-07 DIAGNOSIS — F332 Major depressive disorder, recurrent severe without psychotic features: Principal | ICD-10-CM

## 2018-11-07 MED ORDER — METHOCARBAMOL 750 MG PO TABS
750.0000 mg | ORAL_TABLET | Freq: Three times a day (TID) | ORAL | Status: DC
  Administered 2018-11-07 – 2018-11-09 (×8): 750 mg via ORAL
  Filled 2018-11-07 (×13): qty 1

## 2018-11-07 MED ORDER — GABAPENTIN 300 MG PO CAPS
300.0000 mg | ORAL_CAPSULE | Freq: Three times a day (TID) | ORAL | Status: DC
  Administered 2018-11-07 – 2018-11-09 (×8): 300 mg via ORAL
  Filled 2018-11-07 (×4): qty 1
  Filled 2018-11-07 (×2): qty 42
  Filled 2018-11-07 (×2): qty 1
  Filled 2018-11-07: qty 42
  Filled 2018-11-07 (×3): qty 1

## 2018-11-07 NOTE — Tx Team (Addendum)
Interdisciplinary Treatment and Diagnostic Plan Update  11/07/2018 Time of Session: 11:08 Larry Adams MRN: [id number]  Principal Diagnosis: Severe recurrent major depression without psychotic features [facility])  Secondary Diagnoses: Principal Problem:   Severe recurrent major depression without psychotic features ([place])   Current Medications:  Current Facility-Administered Medications  Medication Dose Route Frequency Provider Last Rate Last Dose  . cloNIDine (CATAPRES) tablet 0.1 mg  0.1 mg Oral QID Lindon Romp A, NP   0.1 mg at 11/07/18 0827   Followed by  . [START ON 11/08/2018] cloNIDine (CATAPRES) tablet 0.1 mg  0.1 mg Oral BH-qamhs Rozetta Nunnery, NP       Followed by  . [START ON 11/10/2018] cloNIDine (CATAPRES) tablet 0.1 mg  0.1 mg Oral QAC breakfast Lindon Romp A, NP      . gabapentin (NEURONTIN) capsule 300 mg  300 mg Oral TID Johnn Hai, MD      . hydrOXYzine (ATARAX/VISTARIL) tablet 25 mg  25 mg Oral Q6H PRN Rozetta Nunnery, NP   25 mg at 11/06/18 2111  . ibuprofen (ADVIL) tablet 600 mg  600 mg Oral Q6H PRN Lindon Romp A, NP      . loperamide (IMODIUM) capsule 2-4 mg  2-4 mg Oral PRN Lindon Romp A, NP      . LORazepam (ATIVAN) tablet 1 mg  1 mg Oral Q6H PRN Lindon Romp A, NP      . methocarbamol (ROBAXIN) tablet 750 mg  750 mg Oral TID Johnn Hai, MD      . multivitamin with minerals tablet 1 tablet  1 tablet Oral Daily Lindon Romp A, NP   1 tablet at 11/07/18 0827  . nicotine (NICODERM CQ - dosed in mg/24 hours) patch 21 mg  21 mg Transdermal Daily Sharma Covert, MD   21 mg at 11/07/18 0827  . ondansetron (ZOFRAN-ODT) disintegrating tablet 4 mg  4 mg Oral Q6H PRN Lindon Romp A, NP      . thiamine (VITAMIN B-1) tablet 100 mg  100 mg Oral Daily Lindon Romp A, NP   100 mg at 11/07/18 0827  . traZODone (DESYREL) tablet 50 mg  50 mg Oral QHS PRN Rozetta Nunnery, NP   50 mg at 11/06/18 2111   PTA Medications: Medications Prior to Admission  Medication Sig Dispense  Refill Last Dose  . acetaminophen (TYLENOL) 325 MG tablet Take 650 mg by mouth every 6 (six) hours as needed for moderate pain.   Past Week at Unknown time  . gabapentin (NEURONTIN) 300 MG capsule Take 1 capsule (300 mg total) by mouth 3 (three) times daily. (Patient not taking: Reported on 11/05/2018) 90 capsule 0 Not Taking at Unknown time  . mirtazapine (REMERON) 15 MG tablet Take 1 tablet (15 mg total) by mouth at bedtime. (Patient not taking: Reported on 11/05/2018) 30 tablet 0 Not Taking at Unknown time  . naloxone (NARCAN) nasal spray 4 mg/0.1 mL Take as needed after suspected heroin overdose (Patient taking differently: Place 0.4 mg into the nose once as needed. Take as needed after suspected heroin overdose) 1 kit 0 Past Week at Unknown time  . traZODone (DESYREL) 50 MG tablet Take 1 tablet (50 mg total) by mouth at bedtime as needed for sleep. (Patient not taking: Reported on 11/05/2018) 30 tablet 0 Not Taking at Unknown time  . venlafaxine XR (EFFEXOR-XR) 75 MG 24 hr capsule Take 1 capsule (75 mg total) by mouth daily with breakfast. (Patient not taking: Reported on 11/05/2018) 30 capsule  0 Not Taking at Unknown time    Patient Stressors: Financial difficulties Medication change or noncompliance Substance abuse  Patient Strengths: Ability for insight Average or above average intelligence Supportive family/friends  Treatment Modalities: Medication Management, Group therapy, Case management,  1 to 1 session with clinician, Psychoeducation, Recreational therapy.   Physician Treatment Plan for Primary Diagnosis: Severe recurrent major depression without psychotic features ([place]) Long Term Goal(s): Improvement in symptoms so as ready for discharge Improvement in symptoms so as ready for discharge   Short Term Goals: Ability to identify and develop effective coping behaviors will improve Ability to maintain clinical measurements within normal limits will improve Compliance with prescribed  medications will improve Ability to identify triggers associated with substance abuse/mental health issues will improve Ability to identify changes in lifestyle to reduce recurrence of condition will improve Ability to verbalize feelings will improve Ability to disclose and discuss suicidal ideas Ability to demonstrate self-control will improve  Medication Management: Evaluate patient's response, side effects, and tolerance of medication regimen.  Therapeutic Interventions: 1 to 1 sessions, Unit Group sessions and Medication administration.  Evaluation of Outcomes: Not Met  Physician Treatment Plan for Secondary Diagnosis: Principal Problem:   Severe recurrent major depression without psychotic features ([place])  Long Term Goal(s): Improvement in symptoms so as ready for discharge Improvement in symptoms so as ready for discharge   Short Term Goals: Ability to identify and develop effective coping behaviors will improve Ability to maintain clinical measurements within normal limits will improve Compliance with prescribed medications will improve Ability to identify triggers associated with substance abuse/mental health issues will improve Ability to identify changes in lifestyle to reduce recurrence of condition will improve Ability to verbalize feelings will improve Ability to disclose and discuss suicidal ideas Ability to demonstrate self-control will improve     Medication Management: Evaluate patient's response, side effects, and tolerance of medication regimen.  Therapeutic Interventions: 1 to 1 sessions, Unit Group sessions and Medication administration.  Evaluation of Outcomes: Not Met   RN Treatment Plan for Primary Diagnosis: Severe recurrent major depression without psychotic features ([place]) Long Term Goal(s): Knowledge of disease and therapeutic regimen to maintain health will improve  Short Term Goals: Ability to participate in decision making will improve, Ability to  verbalize feelings will improve, Ability to disclose and discuss suicidal ideas, Ability to identify and develop effective coping behaviors will improve and Compliance with prescribed medications will improve  Medication Management: RN will administer medications as ordered by provider, will assess and evaluate patient's response and provide education to patient for prescribed medication. RN will report any adverse and/or side effects to prescribing provider.  Therapeutic Interventions: 1 on 1 counseling sessions, Psychoeducation, Medication administration, Evaluate responses to treatment, Monitor vital signs and CBGs as ordered, Perform/monitor CIWA, COWS, AIMS and Fall Risk screenings as ordered, Perform wound care treatments as ordered.  Evaluation of Outcomes: Not Met   LCSW Treatment Plan for Primary Diagnosis: Severe recurrent major depression without psychotic features ([place]) Long Term Goal(s): Safe transition to appropriate next level of care at discharge, Engage patient in therapeutic group addressing interpersonal concerns.  Short Term Goals: Engage patient in aftercare planning with referrals and resources and Increase skills for wellness and recovery  Therapeutic Interventions: Assess for all discharge needs, 1 to 1 time with Social worker, Explore available resources and support systems, Assess for adequacy in community support network, Educate family and significant other(s) on suicide prevention, Complete Psychosocial Assessment, Interpersonal group therapy.  Evaluation of Outcomes: Not  Met   Progress in Treatment: Attending groups: No. Participating in groups: No. Taking medication as prescribed: Yes. Toleration medication: Yes. Family/Significant other contact made: No, will contact:  will contact if given consent to contact Patient understands diagnosis: Yes. Discussing patient identified problems/goals with staff: Yes. Medical problems stabilized or resolved: Yes. Denies  suicidal/homicidal ideation: Yes. Issues/concerns per patient self-inventory: No. Other:   New problem(s) identified: No, Describe:  None  New Short Term/Long Term Goal(s):  Patient Goals:  "I want to detox and go to to treatment"  Discharge Plan or Barriers:   Reason for Continuation of Hospitalization: Depression Medication stabilization Withdrawal symptoms  Estimated Length of Stay: 3-5 days  Attendees: Patient: 11/07/2018   Physician: Dr. Johnn Hai, MD 11/07/2018   Nursing: Rise Paganini ,RN 11/07/2018   RN Care Manager: 11/07/2018   Social Worker: Ardelle Anton, LCSW 11/07/2018   Recreational Therapist:  11/07/2018   Other:  11/07/2018   Other:  11/07/2018   Other: 11/07/2018      Scribe for Treatment Team: Trecia Rogers, LCSW 11/07/2018 12:31 PM

## 2018-11-07 NOTE — Progress Notes (Signed)
Recreation Therapy Notes  Date:  5.11.20 Time: 0930 Location: [address] Dayroom  Group Topic: Stress Management  Goal Area(s) Addresses:  Patient will identify positive stress management techniques. Patient will identify benefits of using stress management post d/c.  Intervention: Stress Management  Activity :  Meditation.  LRT introduced the stress management technique of meditation.  LRT played a meditation that focused on making the most of your day.  Patients were to listen and follow along as meditation played.  Education:  Stress Management, Discharge Planning.   Education Outcome: Acknowledges Education  Clinical Observations/Feedback:  Pt did not attend group session.    Wandalene Abrams, LRT/CTRS         Jaedin Regina A 11/07/2018 11:02 AM 

## 2018-11-07 NOTE — [facility] Suicide Risk Assessment (Signed)
[facility] Admission Suicide Risk Assessment   Nursing information obtained from:  Patient Demographic factors:  Male, Unemployed Current Mental Status:  Suicidal ideation indicated by patient Loss Factors:  Financial problems / change in socioeconomic status Historical Factors:  Impulsivity Risk Reduction Factors:  NA  Total Time spent with patient: 45 minutes Principal Problem: Severe recurrent major depression without psychotic features ([facility]) Diagnosis:  Principal Problem:   Severe recurrent major depression without psychotic features ([facility])  Subjective Data: Reports his overdose was not intentional  Continued Clinical Symptoms:  Alcohol Use Disorder Identification Test Final Score (AUDIT): 15 The "Alcohol Use Disorders Identification Test", Guidelines for Use in Primary Care, Second Edition.  World Science writer [facility]). Score between 0-7:  no or low risk or alcohol related problems. Score between 8-15:  moderate risk of alcohol related problems. Score between 16-19:  high risk of alcohol related problems. Score 20 or above:  warrants further diagnostic evaluation for alcohol dependence and treatment. Musculoskeletal: Strength & Muscle Tone: within normal limits Gait & Station: normal Patient leans: N/A  Psychiatric Specialty Exam: Physical Exam  ROS  Blood pressure 112/83, pulse 64, temperature 98.3 F (36.8 C), resp. rate 20, height 6\' 1"  (1.854 m), weight 99.8 kg.Body mass index is 29.03 kg/m.  General Appearance: Casual  Eye Contact:  Good  Speech:  Clear and Coherent  Volume:  Decreased  Mood:  Anxious and Dysphoric  Affect:  Appropriate and Congruent  Thought Process:  Coherent and Linear  Orientation:  Full (Time, Place, and Person)  Thought Content:  Logical  Suicidal Thoughts:  No  Homicidal Thoughts:  No  Memory:  Immediate;   Fair  Judgement:  Fair  Insight:  Fair  Psychomotor Activity:  Normal  Concentration:  Concentration: Fair  Recall:  [employer]  of Knowledge:  Fair  Language:  Fair  Akathisia:  Negative  Handed:  Right  AIMS (if indicated):     Assets:  Leisure Time Physical Health  ADL's:  Intact  Cognition:  WNL  Sleep:  Number of Hours: 6.75     CLINICAL FACTORS:   Alcohol/Substance Abuse/Dependencies   COGNITIVE FEATURES THAT CONTRIBUTE TO RISK:  Polarized thinking    SUICIDE RISK:   Minimal: No identifiable suicidal ideation.  Patients presenting with no risk factors but with morbid ruminations; may be classified as minimal risk based on the severity of the depressive symptoms  PLAN OF CARE: admit for detox  I certify that inpatient services furnished can reasonably be expected to improve the patient's condition.   [name], MD 11/07/2018, 11:36 AM

## 2018-11-07 NOTE — Progress Notes (Signed)
D:  Patient's self inventory sheet, patient sleeps good, no sleep medication.  Good appetite, low energy level, poor concentration.  Rated depression, hopeless and anxiety #3.  Withdrawals, chilling, runny nose, nausea.  Denied SI.  Denied physical problems.  Denied physical pain.  No pain medicine.  Goal is feeling better.  Plans to take medication.  No discharge plans. A:  Medications administered per MD orders.  Emotional support and encouragement given patient. R:   Denied SI and HI, contracts for safety.  Denied A/V hallucinations.  Safety maintained with 15 minute checks.

## 2018-11-07 NOTE — Progress Notes (Signed)
[facility] MD Progress Note  11/07/2018 11:17 AM Larry Adams  MRN:  [id number] Subjective:   patient in bed with complaints of mild cramping but back pain is prominent denies wanting to harm self and reports continually that his overdose was not intentional he is not suicidal this is been a consistent statement he denies wanting to harm self or others he has no obvious withdrawal symptoms can contract fully Drug screen showed opiates and cocaine as well as alcohol level of 100 he is on a CIWA protocol doing fine with his withdrawal but again we will add Neurontin and Robaxin only Principal Problem: Severe recurrent major depression without psychotic features (HCC) Diagnosis: Principal Problem:   Severe recurrent major depression without psychotic features (HCC)  Total Time spent with patient: 20 minutes Past Medical History:  Past Medical History:  Diagnosis Date  . Anxiety   . Back pain   . Drug overdose   . Mental disorder     Past Surgical History:  Procedure Laterality Date  . HERNIA REPAIR     Family History: History reviewed. No pertinent family history. Family Psychiatric  History: neg Social History:  Social History   Substance and Sexual Activity  Alcohol Use Yes   Comment: pint of liquor a day; varies     Social History   Substance and Sexual Activity  Drug Use Yes  . Frequency: 5.0 times per week  . Types: Benzodiazepines, IV   Comment: reports that he is on klonopin and xanax; hx of marijuana use but drug screen presently negative    Social History   Socioeconomic History  . Marital status: Single    Spouse name: Not on file  . Number of children: Not on file  . Years of education: Not on file  . Highest education level: Not on file  Occupational History  . Not on file  Social Needs  . Financial resource strain: Not on file  . Food insecurity:    Worry: Not on file    Inability: Not on file  . Transportation needs:    Medical: Not on file   Non-medical: Not on file  Tobacco Use  . Smoking status: Current Every Day Smoker    Packs/day: 1.00    Types: Cigarettes  . Smokeless tobacco: Former Engineer, water and Sexual Activity  . Alcohol use: Yes    Comment: pint of liquor a day; varies  . Drug use: Yes    Frequency: 5.0 times per week    Types: Benzodiazepines, IV    Comment: reports that he is on klonopin and xanax; hx of marijuana use but drug screen presently negative  . Sexual activity: Yes  Lifestyle  . Physical activity:    Days per week: Not on file    Minutes per session: Not on file  . Stress: Not on file  Relationships  . Social connections:    Talks on phone: Not on file    Gets together: Not on file    Attends religious service: Not on file    Active member of club or organization: Not on file    Attends meetings of clubs or organizations: Not on file    Relationship status: Not on file  Other Topics Concern  . Not on file  Social History Narrative  . Not on file   Additional Social History:    Pain Medications: Denies use Prescriptions: Denies use Over the Counter: Denies use History of alcohol / drug use?: Yes Longest period  of sobriety (when/how long): 15 months Negative Consequences of Use: Work / Programmer, multimedia, Copywriter, advertising relationships, Surveyor, quantity Withdrawal Symptoms: Weakness                    Sleep: Good  Appetite:  Good  Current Medications: Current Facility-Administered Medications  Medication Dose Route Frequency Provider Last Rate Last Dose  . cloNIDine (CATAPRES) tablet 0.1 mg  0.1 mg Oral QID Nira Conn A, NP   0.1 mg at 11/07/18 0827   Followed by  . [START ON 11/08/2018] cloNIDine (CATAPRES) tablet 0.1 mg  0.1 mg Oral BH-qamhs Jackelyn Poling, NP       Followed by  . [START ON 11/10/2018] cloNIDine (CATAPRES) tablet 0.1 mg  0.1 mg Oral QAC breakfast Nira Conn A, NP      . gabapentin (NEURONTIN) capsule 300 mg  300 mg Oral TID [name], MD      . hydrOXYzine  (ATARAX/VISTARIL) tablet 25 mg  25 mg Oral Q6H PRN Jackelyn Poling, NP   25 mg at 11/06/18 2111  . ibuprofen (ADVIL) tablet 600 mg  600 mg Oral Q6H PRN Nira Conn A, NP      . loperamide (IMODIUM) capsule 2-4 mg  2-4 mg Oral PRN Nira Conn A, NP      . LORazepam (ATIVAN) tablet 1 mg  1 mg Oral Q6H PRN Nira Conn A, NP      . methocarbamol (ROBAXIN) tablet 750 mg  750 mg Oral TID [name], MD      . multivitamin with minerals tablet 1 tablet  1 tablet Oral Daily Nira Conn A, NP   1 tablet at 11/07/18 0827  . nicotine (NICODERM CQ - dosed in mg/24 hours) patch 21 mg  21 mg Transdermal Daily Antonieta Pert, MD   21 mg at 11/07/18 0827  . ondansetron (ZOFRAN-ODT) disintegrating tablet 4 mg  4 mg Oral Q6H PRN Nira Conn A, NP      . thiamine (VITAMIN B-1) tablet 100 mg  100 mg Oral Daily Nira Conn A, NP   100 mg at 11/07/18 0827  . traZODone (DESYREL) tablet 50 mg  50 mg Oral QHS PRN Jackelyn Poling, NP   50 mg at 11/06/18 2111    Lab Results:  Results for orders placed or performed during the hospital encounter of 11/05/18 (from the past 48 hour(s))  Comprehensive metabolic panel     Status: Abnormal   Collection Time: 11/05/18  8:43 PM  Result Value Ref Range   Sodium 145 135 - 145 mmol/L   Potassium 4.1 3.5 - 5.1 mmol/L   Chloride 111 98 - 111 mmol/L   CO2 23 22 - 32 mmol/L   Glucose, Bld 92 70 - 99 mg/dL   BUN 9 6 - 20 mg/dL   Creatinine, Ser 9.53 0.61 - 1.24 mg/dL   Calcium 9.0 8.9 - 96.7 mg/dL   Total Protein 6.7 6.5 - 8.1 g/dL   Albumin 3.7 3.5 - 5.0 g/dL   AST 64 (H) 15 - 41 U/L   ALT 82 (H) 0 - 44 U/L   Alkaline Phosphatase 65 38 - 126 U/L   Total Bilirubin 1.0 0.3 - 1.2 mg/dL   GFR calc non Af Amer >60 >60 mL/min   GFR calc Af Amer >60 >60 mL/min   Anion gap 11 5 - 15    Comment: Performed at [facility], [address], [place], [place] [address]  CBC with Differential  Status: None   Collection Time: 11/05/18  8:43 PM  Result  Value Ref Range   WBC 8.5 4.0 - 10.5 K/uL   RBC 5.12 4.22 - 5.81 MIL/uL   Hemoglobin 16.1 13.0 - 17.0 g/dL   HCT 43.3 29.5 - 18.8 %   MCV 94.7 80.0 - 100.0 fL   MCH 31.4 26.0 - 34.0 pg   MCHC 33.2 30.0 - 36.0 g/dL   RDW 41.6 60.6 - 30.1 %   Platelets 200 150 - 400 K/uL   nRBC 0.0 0.0 - 0.2 %   Neutrophils Relative % 73 %   Neutro Abs 6.3 1.7 - 7.7 K/uL   Lymphocytes Relative 16 %   Lymphs Abs 1.4 0.7 - 4.0 K/uL   Monocytes Relative 9 %   Monocytes Absolute 0.8 0.1 - 1.0 K/uL   Eosinophils Relative 1 %   Eosinophils Absolute 0.0 0.0 - 0.5 K/uL   Basophils Relative 1 %   Basophils Absolute 0.0 0.0 - 0.1 K/uL   Immature Granulocytes 0 %   Abs Immature Granulocytes 0.03 0.00 - 0.07 K/uL    Comment: Performed at [facility], [address] [place], [place] [address]  Urine rapid drug screen (hosp performed)     Status: Abnormal   Collection Time: 11/05/18  8:43 PM  Result Value Ref Range   Opiates POSITIVE (A) NONE DETECTED   Cocaine POSITIVE (A) NONE DETECTED   Benzodiazepines NONE DETECTED NONE DETECTED   Amphetamines NONE DETECTED NONE DETECTED   Tetrahydrocannabinol NONE DETECTED NONE DETECTED   Barbiturates NONE DETECTED NONE DETECTED    Comment: (NOTE) DRUG SCREEN FOR MEDICAL PURPOSES ONLY.  IF CONFIRMATION IS NEEDED FOR ANY PURPOSE, NOTIFY LAB WITHIN 5 DAYS. LOWEST DETECTABLE LIMITS FOR URINE DRUG SCREEN Drug Class                     Cutoff (ng/mL) Amphetamine and metabolites    1000 Barbiturate and metabolites    200 Benzodiazepine                 200 Tricyclics and metabolites     300 Opiates and metabolites        300 Cocaine and metabolites        300 THC                            50 Performed at [facility], [address], [place], [place] [address]   Ethanol     Status: Abnormal   Collection Time: 11/05/18  8:43 PM  Result Value Ref Range   Alcohol, Ethyl (B) 100 (H) <10 mg/dL    Comment: (NOTE) Lowest  detectable limit for serum alcohol is 10 mg/dL. For medical purposes only. Performed at [facility], [address], [place], [place] [address]   SARS Coronavirus 2 (CEPHEID - Performed in [facility] hospital lab), Hosp Order     Status: None   Collection Time: 11/06/18  2:57 AM  Result Value Ref Range   SARS Coronavirus 2 NEGATIVE NEGATIVE    Comment: (NOTE) If result is NEGATIVE SARS-CoV-2 target nucleic acids are NOT DETECTED. The SARS-CoV-2 RNA is generally detectable in upper and lower  respiratory specimens during the acute phase of infection. The lowest  concentration of SARS-CoV-2 viral copies this assay can detect is 250  copies / mL. A negative result does not preclude SARS-CoV-2 infection  and should  not be used as the sole basis for treatment or other  patient management decisions.  A negative result may occur with  improper specimen collection / handling, submission of specimen other  than nasopharyngeal swab, presence of viral mutation(s) within the  areas targeted by this assay, and inadequate number of viral copies  (<250 copies / mL). A negative result must be combined with clinical  observations, patient history, and epidemiological information. If result is POSITIVE SARS-CoV-2 target nucleic acids are DETECTED. The SARS-CoV-2 RNA is generally detectable in upper and lower  respiratory specimens dur ing the acute phase of infection.  Positive  results are indicative of active infection with SARS-CoV-2.  Clinical  correlation with patient history and other diagnostic information is  necessary to determine patient infection status.  Positive results do  not rule out bacterial infection or co-infection with other viruses. If result is PRESUMPTIVE POSTIVE SARS-CoV-2 nucleic acids MAY BE PRESENT.   A presumptive positive result was obtained on the submitted specimen  and confirmed on repeat testing.  While 2019 novel coronavirus  (SARS-CoV-2)  nucleic acids may be present in the submitted sample  additional confirmatory testing may be necessary for epidemiological  and / or clinical management purposes  to differentiate between  SARS-CoV-2 and other Sarbecovirus currently known to infect humans.  If clinically indicated additional testing with an alternate test  methodology [phone]([id number]) is advised. The SARS-CoV-2 RNA is generally  detectable in upper and lower respiratory sp ecimens during the acute  phase of infection. The expected result is Negative. Fact Sheet for Patients:  BoilerBrush.com.cyhttps://www.fda.gov/media/136312/download Fact Sheet for Healthcare Providers: https://pope.com/https://www.fda.gov/media/136313/download This test is not yet approved or cleared by the [place] FDA and has been authorized for detection and/or diagnosis of SARS-CoV-2 by FDA under an Emergency Use Authorization (EUA).  This EUA will remain in effect (meaning this test can be used) for the duration of the COVID-19 declaration under Section 564(b)(1) of the Act, 21 U.S.C. section 360bbb-3(b)(1), unless the authorization is terminated or revoked sooner. Performed at [facility], [address] [place], [place] [id number]     Blood Alcohol level:  Lab Results  Component Value Date   ETH 100 (H) 11/05/2018   ETH 218 (H) 09/11/2017    Metabolic Disorder Labs: No results found for: HGBA1C, MPG No results found for: PROLACTIN No results found for: CHOL, TRIG, HDL, CHOLHDL, VLDL, LDLCALC  Physical Findings: AIMS: Facial and Oral Movements Muscles of Facial Expression: None, normal Lips and Perioral Area: None, normal Jaw: None, normal Tongue: None, normal,Extremity Movements Upper (arms, wrists, hands, fingers): None, normal Lower (legs, knees, ankles, toes): None, normal, Trunk Movements Neck, shoulders, hips: None, normal, Overall Severity Severity of abnormal movements (highest score from questions above): None, normal Incapacitation due  to abnormal movements: None, normal Patient's awareness of abnormal movements (rate only patient's report): No Awareness, Dental Status Current problems with teeth and/or dentures?: No Does patient usually wear dentures?: No  CIWA:  CIWA-Ar Total: 0 COWS:  COWS Total Score: 3  Musculoskeletal: Strength & Muscle Tone: within normal limits Gait & Station: normal Patient leans: N/A  Psychiatric Specialty Exam: Physical Exam  ROS  Blood pressure 112/83, pulse 64, temperature 98.3 F (36.8 C), resp. rate 20, height 6\' 1"  (1.854 m), weight 99.8 kg.Body mass index is 29.03 kg/m.  General Appearance: Casual  Eye Contact:  Good  Speech:  Clear and Coherent  Volume:  Decreased  Mood:  Anxious and Dysphoric  Affect:  Appropriate  and Congruent  Thought Process:  Coherent and Linear  Orientation:  Full (Time, Place, and Person)  Thought Content:  Logical  Suicidal Thoughts:  No  Homicidal Thoughts:  No  Memory:  Immediate;   Fair  Judgement:  Fair  Insight:  Fair  Psychomotor Activity:  Normal  Concentration:  Concentration: Fair  Recall:  [employer] of Knowledge:  Fair  Language:  Fair  Akathisia:  Negative  Handed:  Right  AIMS (if indicated):     Assets:  Leisure Time Physical Health  ADL's:  Intact  Cognition:  WNL  Sleep:  Number of Hours: 6.75     Treatment Plan Summary: Daily contact with patient to assess and evaluate symptoms and progress in treatment Continue detox measures continue to monitor for safety continue current protocol and 15-minute precautions but add Robaxin and add Neurontin only [place] Dube, MD 11/07/2018, 11:17 AM
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
## 2018-11-07 NOTE — Plan of Care (Signed)
Nurse discussed anxiety, depression, coping skills with patient. 

## 2018-11-07 NOTE — Progress Notes (Signed)
Patient did not attend wrap up group. 

## 2018-11-08 NOTE — Progress Notes (Signed)
Pt was invited to group via intercom, and chose not attend group.  

## 2018-11-08 NOTE — Progress Notes (Signed)
[facility] MD Progress Note  11/08/2018 8:04 AM Larry Adams  MRN:  [id number] Subjective:   Patient states general measures for detox are currently working decrease cramping craving so forth no thoughts of harming self or others contracting here.  No psychosis evident or discerned.  Vital stable but has some bradycardia Principal Problem: Severe recurrent major depression without psychotic features (HCC) Diagnosis: Principal Problem:   Severe recurrent major depression without psychotic features (HCC)  Total Time spent with patient: 20 minutes  Past Medical History:  Past Medical History:  Diagnosis Date  . Anxiety   . Back pain   . Drug overdose   . Mental disorder     Past Surgical History:  Procedure Laterality Date  . HERNIA REPAIR     Family History: History reviewed. No pertinent family history.  Social History:  Social History   Substance and Sexual Activity  Alcohol Use Yes   Comment: pint of liquor a day; varies     Social History   Substance and Sexual Activity  Drug Use Yes  . Frequency: 5.0 times per week  . Types: Benzodiazepines, IV   Comment: reports that he is on klonopin and xanax; hx of marijuana use but drug screen presently negative    Social History   Socioeconomic History  . Marital status: Single    Spouse name: Not on file  . Number of children: Not on file  . Years of education: Not on file  . Highest education level: Not on file  Occupational History  . Not on file  Social Needs  . Financial resource strain: Not on file  . Food insecurity:    Worry: Not on file    Inability: Not on file  . Transportation needs:    Medical: Not on file    Non-medical: Not on file  Tobacco Use  . Smoking status: Current Every Day Smoker    Packs/day: 1.00    Types: Cigarettes  . Smokeless tobacco: Former Engineer, water and Sexual Activity  . Alcohol use: Yes    Comment: pint of liquor a day; varies  . Drug use: Yes    Frequency: 5.0 times per  week    Types: Benzodiazepines, IV    Comment: reports that he is on klonopin and xanax; hx of marijuana use but drug screen presently negative  . Sexual activity: Yes  Lifestyle  . Physical activity:    Days per week: Not on file    Minutes per session: Not on file  . Stress: Not on file  Relationships  . Social connections:    Talks on phone: Not on file    Gets together: Not on file    Attends religious service: Not on file    Active member of club or organization: Not on file    Attends meetings of clubs or organizations: Not on file    Relationship status: Not on file  Other Topics Concern  . Not on file  Social History Narrative  . Not on file   Additional Social History:    Pain Medications: Denies use Prescriptions: Denies use Over the Counter: Denies use History of alcohol / drug use?: Yes Longest period of sobriety (when/how long): 15 months Negative Consequences of Use: Work / Programmer, multimedia, Copywriter, advertising relationships, Surveyor, quantity Withdrawal Symptoms: Weakness                    Sleep: Fair  Appetite:  Fair  Current Medications: Current Facility-Administered Medications  Medication  Dose Route Frequency Provider Last Rate Last Dose  . cloNIDine (CATAPRES) tablet 0.1 mg  0.1 mg Oral BH-qamhs Jackelyn Poling, NP       Followed by  . [START ON 11/10/2018] cloNIDine (CATAPRES) tablet 0.1 mg  0.1 mg Oral QAC breakfast Nira Conn A, NP      . gabapentin (NEURONTIN) capsule 300 mg  300 mg Oral TID [name], MD   300 mg at 11/07/18 1718  . hydrOXYzine (ATARAX/VISTARIL) tablet 25 mg  25 mg Oral Q6H PRN Nira Conn A, NP   25 mg at 11/06/18 2111  . ibuprofen (ADVIL) tablet 600 mg  600 mg Oral Q6H PRN Nira Conn A, NP      . loperamide (IMODIUM) capsule 2-4 mg  2-4 mg Oral PRN Nira Conn A, NP      . LORazepam (ATIVAN) tablet 1 mg  1 mg Oral Q6H PRN Nira Conn A, NP      . methocarbamol (ROBAXIN) tablet 750 mg  750 mg Oral TID [name], MD   750 mg at 11/07/18  1719  . multivitamin with minerals tablet 1 tablet  1 tablet Oral Daily Nira Conn A, NP   1 tablet at 11/07/18 0827  . nicotine (NICODERM CQ - dosed in mg/24 hours) patch 21 mg  21 mg Transdermal Daily Antonieta Pert, MD   21 mg at 11/07/18 0827  . ondansetron (ZOFRAN-ODT) disintegrating tablet 4 mg  4 mg Oral Q6H PRN Nira Conn A, NP      . thiamine (VITAMIN B-1) tablet 100 mg  100 mg Oral Daily Nira Conn A, NP   100 mg at 11/07/18 0827  . traZODone (DESYREL) tablet 50 mg  50 mg Oral QHS PRN Nira Conn A, NP   50 mg at 11/07/18 2129    Lab Results: No results found for this or any previous visit (from the past 48 hour(s)).  Blood Alcohol level:  Lab Results  Component Value Date   ETH 100 (H) 11/05/2018   ETH 218 (H) 09/11/2017    Metabolic Disorder Labs: No results found for: HGBA1C, MPG No results found for: PROLACTIN No results found for: CHOL, TRIG, HDL, CHOLHDL, VLDL, LDLCALC  Physical Findings: AIMS: Facial and Oral Movements Muscles of Facial Expression: None, normal Lips and Perioral Area: None, normal Jaw: None, normal Tongue: None, normal,Extremity Movements Upper (arms, wrists, hands, fingers): None, normal Lower (legs, knees, ankles, toes): None, normal, Trunk Movements Neck, shoulders, hips: None, normal, Overall Severity Severity of abnormal movements (highest score from questions above): None, normal Incapacitation due to abnormal movements: None, normal Patient's awareness of abnormal movements (rate only patient's report): No Awareness, Dental Status Current problems with teeth and/or dentures?: No Does patient usually wear dentures?: No  CIWA:  CIWA-Ar Total: 3 COWS:  COWS Total Score: 0  Musculoskeletal: Strength & Muscle Tone: within normal limits Gait & Station: normal Patient leans: N/A  Psychiatric Specialty Exam: Physical Exam  ROS  Blood pressure 106/64, pulse (!) 48, temperature 97.6 F (36.4 C), temperature source Oral, resp.  rate 18, height 6\' 1"  (1.854 m), weight 99.8 kg.Body mass index is 29.03 kg/m.  General Appearance: Casual  Eye Contact:  Good  Speech:  Clear and Coherent  Volume:  Decreased  Mood:  Dysphoric  Affect:  Appropriate and Congruent  Thought Process:  Coherent and Linear  Orientation:  Full (Time, Place, and Person)  Thought Content:  Abstract Reasoning intact  Suicidal Thoughts:  No  Homicidal Thoughts:  No  Memory:  Immediate;   Fair  Judgement:  Fair  Insight:  Fair  Psychomotor Activity:  NA and Normal  Concentration:  Concentration: Good  Recall:  Good  Fund of Knowledge:  Good  Language:  Good  Akathisia:  Negative  Handed:  Right  AIMS (if indicated):     Assets:  Physical Health Resilience  ADL's:  Intact  Cognition:  WNL  Sleep:  Number of Hours: 6.75     Treatment Plan Summary: Daily contact with patient to assess and evaluate symptoms and progress in treatment, Medication management and Plan Continue detox measures continue cognitive therapy and rehab based therapy probable discharge tomorrow no change in protocol at present  [facility], MD 11/08/2018, 8:04 AM

## 2018-11-08 NOTE — Progress Notes (Signed)
Patient ID: Larry Adams, male   DOB: [birth year], 33 y.o.   MRN: [id number]   [place] NOVEL CORONAVIRUS (COVID-19) DAILY CHECK-OFF SYMPTOMS - answer yes or no to each - every day NO YES  Have you had a fever in the past 24 hours?  . Fever (Temp > 37.80C / 100F) X   Have you had any of these symptoms in the past 24 hours? . New Cough .  Sore Throat  .  Shortness of Breath .  Difficulty Breathing .  Unexplained Body Aches   X   Have you had any one of these symptoms in the past 24 hours not related to allergies?   . Runny Nose .  Nasal Congestion .  Sneezing   X   If you have had runny nose, nasal congestion, sneezing in the past 24 hours, has it worsened?  X   EXPOSURES - check yes or no X   Have you traveled outside the state in the past 14 days?  X   Have you been in contact with someone with a confirmed diagnosis of COVID-19 or PUI in the past 14 days without wearing appropriate PPE?  X   Have you been living in the same home as a person with confirmed diagnosis of COVID-19 or a PUI (household contact)?    X   Have you been diagnosed with COVID-19?    X              What to do next: Answered NO to all: Answered YES to anything:   Proceed with unit schedule Follow the [facility] Inpatient Flowsheet.

## 2018-11-08 NOTE — Progress Notes (Signed)
D: Larry Adams remained in his room for much of the evening. He complained of some aches but denied withdrawal sx as well as SI, HI, and AVH. He was subdued but calm/appropriate. No adverse med effects reported.   A: Meds given as ordered. Q15 safety checks maintained. Support/encouragement offered.  R: Pt remains free from harm and continues with treatment. Will continue to monitor for needs/safety.

## 2018-11-08 NOTE — Plan of Care (Signed)
  Problem: Medication: Goal: Compliance with prescribed medication regimen will improve Outcome: Progressing   Problem: Safety: Goal: Ability to remain free from injury will improve Outcome: Progressing   

## 2018-11-08 NOTE — Progress Notes (Signed)
Patient ID: Larry Adams, male   DOB: Oct 05, 1985, 33 y.o.   MRN: [id number]   CSW sent referrals to [facility] and [facility] via fax.

## 2018-11-08 NOTE — Plan of Care (Signed)
  Problem: Medication: Goal: Compliance with prescribed medication regimen will improve Outcome: Progressing   Problem: Safety: Goal: Ability to remain free from injury will improve Outcome: Progressing    D: Pt alert and oriented on the unit. Pt engaging with RN staff and other pts. Pt denies SI/HI, A/VH. Pt dis participated during unit groups and activities and isolated to his room asleep most of the day. Pt was compliant with his medications. Pt is pleasant and cooperative. A: Education, support and encouragement provided, q15 minute safety checks remain in effect. Medications administered per MD orders. R: No reactions/side effects to medicine noted. Pt denies any concerns at this time, and verbally contracts for safety. Pt ambulating on the unit with no issues. Pt remains safe on and off the unit.

## 2018-11-09 MED ORDER — MIRTAZAPINE 15 MG PO TABS
15.0000 mg | ORAL_TABLET | Freq: Once | ORAL | Status: AC
  Administered 2018-11-09: 15 mg via ORAL
  Filled 2018-11-09 (×2): qty 1

## 2018-11-09 MED ORDER — MIRTAZAPINE 15 MG PO TABS: 15.0000 mg | ORAL_TABLET | Freq: Every day | ORAL | 1 refills | Status: DC

## 2018-11-09 MED ORDER — GABAPENTIN 300 MG PO CAPS: 300.0000 mg | ORAL_CAPSULE | Freq: Three times a day (TID) | ORAL | 1 refills | Status: DC

## 2018-11-09 MED ORDER — TRAZODONE HCL 50 MG PO TABS: 50.0000 mg | ORAL_TABLET | Freq: Every evening | ORAL | 1 refills | Status: DC | PRN

## 2018-11-09 MED ORDER — VENLAFAXINE HCL ER 75 MG PO CP24: 75.0000 mg | ORAL_CAPSULE | Freq: Every day | ORAL | 1 refills | Status: DC

## 2018-11-09 MED ORDER — MIRTAZAPINE 15 MG PO TABS
15.0000 mg | ORAL_TABLET | Freq: Every day | ORAL | Status: DC
  Filled 2018-11-09: qty 14

## 2018-11-09 MED ORDER — VENLAFAXINE HCL ER 75 MG PO CP24
75.0000 mg | ORAL_CAPSULE | Freq: Every day | ORAL | Status: DC
  Filled 2018-11-09: qty 14

## 2018-11-09 NOTE — Progress Notes (Signed)
[facility] MD Progress Note  11/09/2018 8:08 AM Larry Adams  MRN:  [id number] Subjective:   Patient's detox is progressing no thoughts of harming self or others somewhat withdrawn and poor eye contact and minimally engaged in the interview process however states he is hoping to get into some type of rehab has nowhere else to go fears relapse.  On the wait list for [facility] and has potential to go to [facility] or [place] Principal Problem: Severe recurrent major depression without psychotic features (HCC) Diagnosis: Principal Problem:   Severe recurrent major depression without psychotic features (HCC)  Total Time spent with patient: 20 minutes Past Medical History:  Past Medical History:  Diagnosis Date  . Anxiety   . Back pain   . Drug overdose   . Mental disorder     Past Surgical History:  Procedure Laterality Date  . HERNIA REPAIR     Family History: History reviewed. No pertinent family history. Family Psychiatric  History: neg Social History:  Social History   Substance and Sexual Activity  Alcohol Use Yes   Comment: pint of liquor a day; varies     Social History   Substance and Sexual Activity  Drug Use Yes  . Frequency: 5.0 times per week  . Types: Benzodiazepines, IV   Comment: reports that he is on klonopin and xanax; hx of marijuana use but drug screen presently negative    Social History   Socioeconomic History  . Marital status: Single    Spouse name: Not on file  . Number of children: Not on file  . Years of education: Not on file  . Highest education level: Not on file  Occupational History  . Not on file  Social Needs  . Financial resource strain: Not on file  . Food insecurity:    Worry: Not on file    Inability: Not on file  . Transportation needs:    Medical: Not on file    Non-medical: Not on file  Tobacco Use  . Smoking status: Current Every Day Smoker    Packs/day: 1.00    Types: Cigarettes  . Smokeless tobacco: Former Engineer, water and  Sexual Activity  . Alcohol use: Yes    Comment: pint of liquor a day; varies  . Drug use: Yes    Frequency: 5.0 times per week    Types: Benzodiazepines, IV    Comment: reports that he is on klonopin and xanax; hx of marijuana use but drug screen presently negative  . Sexual activity: Yes  Lifestyle  . Physical activity:    Days per week: Not on file    Minutes per session: Not on file  . Stress: Not on file  Relationships  . Social connections:    Talks on phone: Not on file    Gets together: Not on file    Attends religious service: Not on file    Active member of club or organization: Not on file    Attends meetings of clubs or organizations: Not on file    Relationship status: Not on file  Other Topics Concern  . Not on file  Social History Narrative  . Not on file   Additional Social History:    Pain Medications: Denies use Prescriptions: Denies use Over the Counter: Denies use History of alcohol / drug use?: Yes Longest period of sobriety (when/how long): 15 months Negative Consequences of Use: Work / Programmer, multimedia, Copywriter, advertising relationships, Surveyor, quantity Withdrawal Symptoms: Weakness  Sleep: Fair  Appetite:  Fair  Current Medications: Current Facility-Administered Medications  Medication Dose Route Frequency Provider Last Rate Last Dose  . cloNIDine (CATAPRES) tablet 0.1 mg  0.1 mg Oral BH-qamhs Nira Conn A, NP   0.1 mg at 11/08/18 2118   Followed by  . [START ON 11/10/2018] cloNIDine (CATAPRES) tablet 0.1 mg  0.1 mg Oral QAC breakfast Nira Conn A, NP      . gabapentin (NEURONTIN) capsule 300 mg  300 mg Oral TID [name], MD   300 mg at 11/08/18 1708  . ibuprofen (ADVIL) tablet 600 mg  600 mg Oral Q6H PRN Nira Conn A, NP      . methocarbamol (ROBAXIN) tablet 750 mg  750 mg Oral TID [name], MD   750 mg at 11/08/18 1708  . multivitamin with minerals tablet 1 tablet  1 tablet Oral Daily Nira Conn A, NP   1 tablet at 11/08/18 0819   . nicotine (NICODERM CQ - dosed in mg/24 hours) patch 21 mg  21 mg Transdermal Daily Antonieta Pert, MD   21 mg at 11/08/18 0818  . thiamine (VITAMIN B-1) tablet 100 mg  100 mg Oral Daily Nira Conn A, NP   100 mg at 11/08/18 0819  . traZODone (DESYREL) tablet 50 mg  50 mg Oral QHS PRN Jackelyn Poling, NP   50 mg at 11/08/18 2118    Lab Results: No results found for this or any previous visit (from the past 48 hour(s)).  Blood Alcohol level:  Lab Results  Component Value Date   ETH 100 (H) 11/05/2018   ETH 218 (H) 09/11/2017    Metabolic Disorder Labs: No results found for: HGBA1C, MPG No results found for: PROLACTIN No results found for: CHOL, TRIG, HDL, CHOLHDL, VLDL, LDLCALC  Physical Findings: AIMS: Facial and Oral Movements Muscles of Facial Expression: None, normal Lips and Perioral Area: None, normal Jaw: None, normal Tongue: None, normal,Extremity Movements Upper (arms, wrists, hands, fingers): None, normal Lower (legs, knees, ankles, toes): None, normal, Trunk Movements Neck, shoulders, hips: None, normal, Overall Severity Severity of abnormal movements (highest score from questions above): None, normal Incapacitation due to abnormal movements: None, normal Patient's awareness of abnormal movements (rate only patient's report): No Awareness, Dental Status Current problems with teeth and/or dentures?: No Does patient usually wear dentures?: No  CIWA:  CIWA-Ar Total: 3 COWS:  COWS Total Score: 1  Musculoskeletal: Strength & Muscle Tone: within normal limits Gait & Station: normal Patient leans: N/A  Psychiatric Specialty Exam: Physical Exam  ROS  Blood pressure (!) 95/54, pulse (!) 42, temperature 97.6 F (36.4 C), temperature source Oral, resp. rate 20, height 6\' 1"  (1.854 m), weight 99.8 kg, SpO2 100 %.Body mass index is 29.03 kg/m.  General Appearance: Casual  Eye Contact:  none  Speech:  Clear and Coherent  Volume:  Normal  Mood:  Dysphoric   Affect:  Blunt and Congruent  Thought Process:  Goal Directed  Orientation:  Full (Time, Place, and Person)  Thought Content:  Logical and Tangential  Suicidal Thoughts:  No  Homicidal Thoughts:  No  Memory:  Immediate;   Fair  Judgement:  Fair  Insight:  Fair  Psychomotor Activity:  Decreased  Concentration:  Concentration: Fair  Recall:  Good  Fund of Knowledge:  Good  Language:  Good  Akathisia:  Negative  Handed:  Right  AIMS (if indicated):     Assets:  Communication Skills Desire for Improvement  ADL's:  Intact  Cognition:  WNL  Sleep:  Number of Hours: 6.75     Treatment Plan Summary: Daily contact with patient to assess and evaluate symptoms and progress in treatment, Medication management and Plan We will continue detox measures probable discharge tomorrow continue cognitive work and engage in rehab based therapies in groups  [facility]thFARAH,Paisli Silfies, MD 11/09/2018, 8:08 AM

## 2018-11-09 NOTE — Plan of Care (Signed)
  Problem: Safety: Goal: Ability to remain free from injury will improve Outcome: Progressing   Problem: Education: Goal: Mental status will improve Outcome: Progressing   D: Pt alert and oriented on the unit. Pt engaging with RN staff and other pts. Pt denies SI/HI, A/VH. Pt's affect was flat and sad. Pt is pleasant and cooperative. A: Education, support and encouragement provided, q15 minute safety checks remain in effect. Medications administered per MD orders. R: No reactions/side effects to medicine noted. Pt denies any concerns at this time, and verbally contracts for safety. Pt ambulating on the unit with no issues. Pt remains safe on and off the unit.

## 2018-11-09 NOTE — Progress Notes (Signed)
D: Pt was in dayroom upon initial approach.  Pt presents with depressed affect and mood.  He reports his day was "all right."  His goal is "getting out of here."  Pt reports he feels safe to discharge in the morning.  He reported that he has a ride and cab is no longer needed.  This was reviewed with [facility], who says that this is fine.  Pt knows he is to be at [facility].  Pt denies SI/HI, denies hallucinations, denies pain.  Pt has been visible in milieu interacting with peers and staff appropriately.  Pt attended evening group.    A: Introduced self to pt.  Met with pt 1:1.  Actively listened to pt and offered support and encouragement.  Pt requested Remeron for sleep tonight.  On-site provider notified and Remeron 15 mg POX1 was ordered and administered.  Q15 minute safety checks in place.  R: Pt is safe on the unit.  Pt is compliant with medication.  Pt verbally contracts for safety.  Will continue to monitor and assess.

## 2018-11-09 NOTE — Progress Notes (Signed)
Patient ID: Larry Adams, male   DOB: 03/30/1986, 33 y.o.   MRN: [id number]   CSW received call from [facility] that pt was accepted and could come on 11/10/2018. Pt needs to arrive by 7:45am with a 30 day supply of medications and one refill on the label. CSW contacted NP, Reola Calkins to notify him.

## 2018-11-09 NOTE — Plan of Care (Signed)
D: Larry Adams is calm and appropriate in the milieu but he forwards little. He was present in the dayroom for part of the evening. He denied SI, HI, and AVH, as well as any adverse med effects. He denied concerns, complaints, or questions when asked.   A: Meds given as ordered, including PRN trazodone for sleep with positive results. Q15 safety checks maintained. Support/encouragement offered.  R: Pt remains free from harm and continues with treatment. Will continue to monitor for needs/safety.    Problem: Education: Goal: Knowledge of the prescribed therapeutic regimen will improve Outcome: Progressing

## 2018-11-09 NOTE — Progress Notes (Signed)
Adult Psychoeducational Group Note  Date:  11/09/2018 Time:  9:26 PM  Group Topic/Focus:  Wrap-Up Group:   The focus of this group is to help patients review their daily goal of treatment and discuss progress on daily workbooks.  Participation Level:  Active  Participation Quality:  Appropriate  Affect:  Appropriate  Cognitive:  Appropriate  Insight: Appropriate  Engagement in Group:  Engaged  Modes of Intervention:  Discussion  Additional Comments:  Pt stated his goal was to discuss his discharge plan.  Pt stated he is going to [facility] and did meet his goal.  Pt rated the day at a 7/10.  Louiza Moor 11/09/2018, 9:26 PM

## 2018-11-09 NOTE — [facility] Suicide Risk Assessment (Signed)
[facility] Discharge Suicide Risk Assessment   Principal Problem: Severe recurrent major depression without psychotic features [facility]) Discharge Diagnoses: Principal Problem:   Severe recurrent major depression without psychotic features (HCC)  Denies suicidal thoughts plans or intent, however the greatest risk to this patient is a non-intentional overdose  Total Time spent with patient: 45 minutes  Musculoskeletal: Strength & Muscle Tone: within normal limits Gait & Station: normal Patient leans: N/A  Psychiatric Specialty Exam: Review of Systems  Constitutional: Negative.   HENT: Negative.   Eyes: Negative.   Respiratory: Negative.   Cardiovascular: Negative.   Gastrointestinal: Negative.   Musculoskeletal: Negative.   Skin: Negative.   Neurological: Negative.   Endo/Heme/Allergies: Negative.     Blood pressure (!) 104/56, pulse (!) 49, temperature 97.7 F (36.5 C), temperature source Oral, resp. rate 18, height 6\' 1"  (1.854 m), weight 99.8 kg, SpO2 100 %.Body mass index is 29.03 kg/m.  General Appearance: Disheveled  Eye Contact::  Good  Speech:  Normal Rate409  Volume:  Normal  Mood:  Euthymic  Affect:  Constricted  Thought Process:  Coherent  Orientation:  Full (Time, Place, and Person)  Thought Content: Logical coherent goal-directed intact associations  Suicidal Thoughts:  No  Homicidal Thoughts:  No  Memory:  Immediate;   Fair  Judgement:  Fair  Insight:  Fair  Psychomotor Activity:  Normal  Concentration:  Good  Recall:  Good  Fund of Knowledge:Good  Language: Good  Akathisia:  Negative  Handed:  Right  AIMS (if indicated):     Assets:  Communication Skills Leisure Time Physical Health  Sleep:  Number of Hours: 6.75  Cognition: WNL  ADL's:  Intact   Mental Status Per Nursing Assessment::   On Admission:  Suicidal ideation indicated by patient  Demographic Factors:  Male  Loss Factors: Decrease in vocational status  Historical Factors: NA  Risk  Reduction Factors:   Sense of responsibility to family and Religious beliefs about death  Continued Clinical Symptoms:  Dysthymia Alcohol/Substance Abuse/Dependencies  Cognitive Features That Contribute To Risk:  None    Suicide Risk:  Minimal: No identifiable suicidal ideation.  Patients presenting with no risk factors but with morbid ruminations; may be classified as minimal risk based on the severity of the depressive symptoms  Follow-up Information    Services, [facility]. Go on 11/10/2018.   Why:  You will be going to [facility]. You will leave at 7am with a taxi voucher to arrive at 7:45am. You will need a 30-day med supply with one refill on the label.  Contact information: [address] [place] [address] [phone]           Plan Of Care/Follow-up recommendations:  Activity:  full  Xzander Gilham, MD 11/09/2018, 2:56 PM

## 2018-11-09 NOTE — Progress Notes (Signed)
Pt was invited to group via intercom, and the MHT went room to room to inform pts about group. Pt chose not to attend goals/orientation group.  

## 2018-11-09 NOTE — Progress Notes (Signed)
Patient ID: Yanuel Hallas, male   DOB: 11/25/1985, 32 y.o.   MRN: [id number]   [place] NOVEL CORONAVIRUS (COVID-19) DAILY CHECK-OFF SYMPTOMS - answer yes or no to each - every day NO YES  Have you had a fever in the past 24 hours?  . Fever (Temp > 37.80C / 100F) X   Have you had any of these symptoms in the past 24 hours? . New Cough .  Sore Throat  .  Shortness of Breath .  Difficulty Breathing .  Unexplained Body Aches   X   Have you had any one of these symptoms in the past 24 hours not related to allergies?   . Runny Nose .  Nasal Congestion .  Sneezing   X   If you have had runny nose, nasal congestion, sneezing in the past 24 hours, has it worsened?  X   EXPOSURES - check yes or no X   Have you traveled outside the state in the past 14 days?  X   Have you been in contact with someone with a confirmed diagnosis of COVID-19 or PUI in the past 14 days without wearing appropriate PPE?  X   Have you been living in the same home as a person with confirmed diagnosis of COVID-19 or a PUI (household contact)?    X   Have you been diagnosed with COVID-19?    X              What to do next: Answered NO to all: Answered YES to anything:   Proceed with unit schedule Follow the [facility] Inpatient Flowsheet.   

## 2018-11-09 NOTE — Progress Notes (Signed)
Recreation Therapy Notes  Date:  5.13.20 Time: 0930 Location: [address] Dayroom  Group Topic: Stress Management  Goal Area(s) Addresses:  Patient will identify positive stress management techniques. Patient will identify benefits of using stress management post d/c.   Intervention:  Stress Management  Activity :  Progressive Muscle Relaxation.  LRT introduced the stress management technique of PMR.  Patients were to listen and follow along with the instructions given by LRT.    Education:  Stress Management, Discharge Planning.   Education Outcome: Acknowledges Education  Clinical Observations/Feedback:  Pt did not attend group.      Donnel Venuto, LRT/CTRS         Nain Rudd A 11/09/2018 10:44 AM 
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
## 2018-11-10 NOTE — Progress Notes (Signed)
Pt denies SI/HI, denies hallucinations, denies pain.  He is alert and oriented X4.  Pt provided with 2 week supply of medications.  AVS reviewed with pt.  Pt provided with appropriate discharge paperwork.  All belongings returned to pt.  He is ambulatory and in no distress.  He expresses that he feels safe to discharge.  Pt discharged to lobby with understanding that he is to be at [facility] at 0745.

## 2018-11-10 NOTE — Progress Notes (Signed)
  [facility] Adult Case Management Discharge Plan :  Will you be returning to the same living situation after discharge:  No.; [facility]  At discharge, do you have transportation home?: Yes,  taxi to residential treatment Do you have the ability to pay for your medications: No.; no insurance  Release of information consent forms completed and in the chart;  Patient's signature needed at discharge.  Patient to Follow up at: Follow-up Information    Services, [facility]. Go on 11/10/2018.   Why:  You will be going to [facility]. You will leave at 7am with a taxi voucher to arrive at 7:45am. You will need a 30-day med supply with one refill on the label.  Contact information: [address] [place] [address] [phone]           Next level of care provider has access to [facility] Link:no  Safety Planning and Suicide Prevention discussed: No.; pt refused  Have you used any form of tobacco in the last 30 days? (Cigarettes, Smokeless Tobacco, Cigars, and/or Pipes): Yes  Has patient been referred to the Quitline?: Patient refused referral  Patient has been referred for addiction treatment: Yes  Delphia Grates, LCSW 11/10/2018, 8:24 AM

## 2018-11-15 ENCOUNTER — Emergency Department (HOSPITAL_COMMUNITY)
Admission: EM | Admit: 2018-11-15 | Discharge: 2018-11-15 | Disposition: A | Discharge status: Home / Self Care | Payer: Self-pay | Attending: Emergency Medicine | Admitting: Emergency Medicine

## 2018-11-15 ENCOUNTER — Emergency Department (HOSPITAL_COMMUNITY): Payer: Self-pay

## 2018-11-15 ENCOUNTER — Encounter (HOSPITAL_COMMUNITY): Payer: Self-pay | Admitting: Family Medicine

## 2018-11-15 DIAGNOSIS — Z79899 Other long term (current) drug therapy: Secondary | ICD-10-CM | POA: Insufficient documentation

## 2018-11-15 DIAGNOSIS — T40601A Poisoning by unspecified narcotics, accidental (unintentional), initial encounter: Secondary | ICD-10-CM

## 2018-11-15 DIAGNOSIS — F17210 Nicotine dependence, cigarettes, uncomplicated: Secondary | ICD-10-CM | POA: Insufficient documentation

## 2018-11-15 DIAGNOSIS — T507X1A Poisoning by analeptics and opioid receptor antagonists, accidental (unintentional), initial encounter: Secondary | ICD-10-CM | POA: Insufficient documentation

## 2018-11-15 LAB — CBC WITH DIFFERENTIAL/PLATELET
Abs Immature Granulocytes: 0.03 10*3/uL (ref 0.00–0.07)
Basophils Absolute: 0.1 10*3/uL (ref 0.0–0.1)
Basophils Relative: 1 %
Eosinophils Absolute: 0.1 10*3/uL (ref 0.0–0.5)
Eosinophils Relative: 1 %
HCT: 49.7 % (ref 39.0–52.0)
Hemoglobin: 16.2 g/dL (ref 13.0–17.0)
Immature Granulocytes: 0 %
Lymphocytes Relative: 20 %
Lymphs Abs: 1.9 10*3/uL (ref 0.7–4.0)
MCH: 30.8 pg (ref 26.0–34.0)
MCHC: 32.6 g/dL (ref 30.0–36.0)
MCV: 94.5 fL (ref 80.0–100.0)
Monocytes Absolute: 1 10*3/uL (ref 0.1–1.0)
Monocytes Relative: 10 %
Neutro Abs: 6.7 10*3/uL (ref 1.7–7.7)
Neutrophils Relative %: 68 %
Platelets: 229 10*3/uL (ref 150–400)
RBC: 5.26 MIL/uL (ref 4.22–5.81)
RDW: 14 % (ref 11.5–15.5)
WBC: 9.8 10*3/uL (ref 4.0–10.5)
nRBC: 0 % (ref 0.0–0.2)

## 2018-11-15 LAB — URINALYSIS, ROUTINE W REFLEX MICROSCOPIC
Bilirubin Urine: NEGATIVE
Glucose, UA: NEGATIVE mg/dL
Hgb urine dipstick: NEGATIVE
Ketones, ur: 5 mg/dL — AB
Leukocytes,Ua: NEGATIVE
Nitrite: NEGATIVE
Protein, ur: NEGATIVE mg/dL
Specific Gravity, Urine: 1.023 (ref 1.005–1.030)
pH: 5 (ref 5.0–8.0)

## 2018-11-15 LAB — RAPID URINE DRUG SCREEN, HOSP PERFORMED
Amphetamines: NOT DETECTED
Barbiturates: NOT DETECTED
Benzodiazepines: POSITIVE — AB
Cocaine: NOT DETECTED
Opiates: NOT DETECTED
Tetrahydrocannabinol: NOT DETECTED

## 2018-11-15 LAB — COMPREHENSIVE METABOLIC PANEL
ALT: 74 U/L — ABNORMAL HIGH (ref 0–44)
AST: 70 U/L — ABNORMAL HIGH (ref 15–41)
Albumin: 4.1 g/dL (ref 3.5–5.0)
Alkaline Phosphatase: 64 U/L (ref 38–126)
Anion gap: 12 (ref 5–15)
BUN: 11 mg/dL (ref 6–20)
CO2: 19 mmol/L — ABNORMAL LOW (ref 22–32)
Calcium: 9.1 mg/dL (ref 8.9–10.3)
Chloride: 112 mmol/L — ABNORMAL HIGH (ref 98–111)
Creatinine, Ser: 1.21 mg/dL (ref 0.61–1.24)
GFR calc Af Amer: >60 mL/min (ref >60)
GFR calc non Af Amer: >60 mL/min (ref >60)
Glucose, Bld: 128 mg/dL — ABNORMAL HIGH (ref 70–99)
Potassium: 3.3 mmol/L — ABNORMAL LOW (ref 3.5–5.1)
Sodium: 143 mmol/L (ref 135–145)
Total Bilirubin: 1.7 mg/dL — ABNORMAL HIGH (ref 0.3–1.2)
Total Protein: 7.3 g/dL (ref 6.5–8.1)

## 2018-11-15 LAB — ACETAMINOPHEN LEVEL: Acetaminophen (Tylenol), Serum: <10 ug/mL — ABNORMAL LOW (ref 10–30)

## 2018-11-15 LAB — SALICYLATE LEVEL: Salicylate Lvl: <7 mg/dL (ref 2.8–30.0)

## 2018-11-15 LAB — TSH: TSH: 4.495 u[IU]/mL (ref 0.350–4.500)

## 2018-11-15 MED ORDER — OLANZAPINE 10 MG IM SOLR
10.0000 mg | Freq: Once | INTRAMUSCULAR | Status: AC
  Administered 2018-11-15: 10 mg via INTRAMUSCULAR
  Filled 2018-11-15: qty 10

## 2018-11-15 MED ORDER — SODIUM CHLORIDE 0.9 % IV BOLUS
1000.0000 mL | Freq: Once | INTRAVENOUS | Status: AC
  Administered 2018-11-15: 19:00:00 1000 mL via INTRAVENOUS

## 2018-11-15 MED ORDER — STERILE WATER FOR INJECTION IJ SOLN
INTRAMUSCULAR | Status: AC
  Administered 2018-11-15: 17:00:00 2.1 mL
  Filled 2018-11-15: qty 10

## 2018-11-15 NOTE — ED Notes (Signed)
Bed: WA07 Expected date:  Expected time:  Means of arrival:  Comments: EMS-heroin OD/combative

## 2018-11-15 NOTE — ED Notes (Signed)
Provided a warm blanket.

## 2018-11-15 NOTE — ED Notes (Signed)
Patient in radiology for CT scan.

## 2018-11-15 NOTE — ED Triage Notes (Signed)
Patient was transported by [facility] from a females car in a [employer] parking lot. Patient is an overdose of heroin and has drunk a 25oz beer. The male who was sitting with patient administered 0.8mg  of NARCAN. When the fire department responded, they administered 1mg  of NARCAN nasally. When EMS arrived, they administered 1mg  of NARCAN via IV. Then, administered 2mg  of NARCAN nasally again. When at the EMS bay, EMS admininstered VERSED 5mg .

## 2018-11-15 NOTE — ED Notes (Signed)
Prior to administering IV fluids, assisted patient with using the urinal.

## 2018-11-15 NOTE — ED Provider Notes (Signed)
[facility]-EMERGENCY DEPT Provider Note   CSN: [id number] Arrival date & time: 11/15/18  1530    History   Chief Complaint No chief complaint on file.   HPI Larry Adams is a 33 y.o. male.     33 yo M with a chief complaints of likely heroin overdose.  Call came out to EMS that the patient had likely overdosed on heroin by a bystander.  Found to be apneic requiring 5 mg of Narcan.  Patient then became breathing spontaneously.  Started becoming more alert but was agitated with tachycardia and fever.  Patient's mom states that he has used meth off and on as well.  Patient is altered and unable to provide any history level 5 caveat.  The history is provided by the patient and the EMS personnel.  Illness  Severity:  Mild Onset quality:  Sudden Duration:  1 day Timing:  Constant Progression:  Unchanged Chronicity:  New Associated symptoms: no abdominal pain, no chest pain, no congestion, no diarrhea, no fever, no headaches, no myalgias, no rash, no shortness of breath and no vomiting     Past Medical History:  Diagnosis Date  . Anxiety   . Back pain   . Drug overdose   . Mental disorder     Patient Active Problem List   Diagnosis Date Noted  . Severe recurrent major depression without psychotic features (HCC) 11/06/2018  . Alcohol abuse with alcohol-induced mood disorder (HCC) 09/12/2017  . MDD (major depressive disorder), recurrent severe, without psychosis (HCC) 09/12/2017  . Benzodiazepine dependence (HCC) 07/23/2014  . Alcohol abuse 07/23/2014  . Substance induced mood disorder (HCC) 07/23/2014  . 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 HERNIA REPAIR          Home Medications    Prior to Admission medications   Medication Sig Start Date End Date Taking? Authorizing Provider  gabapentin (NEURONTIN) 300 MG capsule Take 1 capsule (300 mg total) by mouth 3 (three) times  daily. 11/09/18   [name], MD  mirtazapine (REMERON) 15 MG tablet Take 1 tablet (15 mg total) by mouth at bedtime. 11/09/18   [name], MD  traZODone (DESYREL) 50 MG tablet Take 1 tablet (50 mg total) by mouth at bedtime as needed for sleep. 11/09/18   [name], MD  venlafaxine XR (EFFEXOR-XR) 75 MG 24 hr capsule Take 1 capsule (75 mg total) by mouth daily with breakfast. 11/09/18   [name], MD    Family History No family history on file.  Social History Social History   Tobacco Use  . Smoking status: Current Every Day Smoker    Packs/day: 1.00    Types: Cigarettes  . Smokeless tobacco: Former Engineer, water Use Topics  . Alcohol use: Yes    Comment: pint of liquor a day; varies  . Drug use: Yes    Frequency: 5.0 times per week    Types: Benzodiazepines, IV    Comment: reports that he is on klonopin and xanax; hx of marijuana use but drug screen presently negative     Allergies   Morphine and related   Review of Systems Review of Systems  Unable to perform ROS: Mental status change  Constitutional: Negative for chills and fever.  HENT: Negative for congestion and facial swelling.   Eyes: Negative for discharge and visual disturbance.  Respiratory: Negative for shortness of breath.   Cardiovascular: Negative for chest pain and palpitations.  Gastrointestinal: Negative for abdominal pain, diarrhea and vomiting.  Musculoskeletal: Negative for arthralgias and myalgias.  Skin: Negative for color change and rash.  Neurological: Negative for tremors, syncope and headaches.  Psychiatric/Behavioral: Negative for confusion and dysphoric mood.     Physical Exam Updated Vital Signs There were no vitals taken for this visit.  Physical Exam Vitals signs and nursing note reviewed.  Constitutional:      Appearance: He is well-developed.  HENT:     Head: Normocephalic and atraumatic.  Eyes:     Pupils: Pupils are equal, round, and reactive to light.  Neck:      Musculoskeletal: Normal range of motion and neck supple.     Vascular: No JVD.  Cardiovascular:     Rate and Rhythm: Regular rhythm. Tachycardia present.     Heart sounds: No murmur. No friction rub. No gallop.   Pulmonary:     Effort: No respiratory distress.     Breath sounds: No wheezing.  Abdominal:     General: There is no distension.     Tenderness: There is no guarding or rebound.  Musculoskeletal: Normal range of motion.  Skin:    Coloration: Skin is not pale.     Findings: No rash.  Neurological:     Mental Status: He is alert.     Comments: Patient appears to be hallucinating and looks like that he is trying to eat something.  Moving all 4 extremities.      ED Treatments / Results  Labs (all labs ordered are listed, but only abnormal results are displayed) Labs Reviewed  CBC WITH DIFFERENTIAL/PLATELET  COMPREHENSIVE METABOLIC PANEL  ACETAMINOPHEN LEVEL  SALICYLATE LEVEL  RAPID URINE DRUG SCREEN, HOSP PERFORMED  URINALYSIS, ROUTINE W REFLEX MICROSCOPIC  CBG MONITORING, ED    EKG None  Radiology No results found.  Procedures Procedures (including critical care time)  Medications Ordered in ED Medications  OLANZapine (ZYPREXA) injection 10 mg (has no administration in time range)     Initial Impression / Assessment and Plan / ED Course  I have reviewed the triage vital signs and the nursing notes.  Pertinent labs & imaging results that were available during my care of the patient were reviewed by me and considered in my medical decision making (see chart for details).  Clinical Course as of Nov 15 1798  Tue Nov 15, 2018  1617 Mixed OD meth heroin, got narcan, apnea in the field, agitated here, labs pending, metabolize   [MB]    Clinical Course User Index [MB] Larry Sous, MD       33 yo M with a history of polysubstance abuse comes in with altered mental status.  Likely due to the same.  As the patient is too altered to have a conversation  with me will obtain a laboratory evaluation.  Imaging of the head.  The patient right now is too agitated to lay in the bed I will sedate him for his own safety and the safety of staff.  Signed out to Dr. Pilar Plate, please see his note for further details of care.   The patients results and plan were reviewed and discussed.   Any x-rays performed were independently reviewed by myself.   Differential diagnosis were considered with the presenting HPI.  Medications  OLANZapine (ZYPREXA) injection 10 mg (10 mg Intramuscular Given 11/15/18 1648)  sterile water (preservative free) injection (2.1 mLs  Given 11/15/18 1648)  sodium chloride 0.9 % bolus 1,000 mL (0 mLs Intravenous Stopped  11/15/18 2045)    Vitals:   11/15/18 1905 11/15/18 1915 11/15/18 2011 11/15/18 2015  BP: 126/89 118/71 135/81 (!) 135/95  Pulse: (!) 112 (!) 104 96 95  Resp: (!) 23 17 17 16   Temp:      TempSrc:      SpO2: 100% 100% 99% 94%  Weight:      Height:        Final diagnoses:  Opiate overdose, accidental or unintentional, initial encounter [facility])      Final Clinical Impressions(s) / ED Diagnoses   Final diagnoses:  None    ED Discharge Orders    None       Melene Plan, DO 11/16/18 1801

## 2018-11-15 NOTE — ED Provider Notes (Signed)
Assumed care from Dr. Adela Lank at shift change.  On my initial evaluation, patient with heart rate in the 120s, still actively intoxicated, conversant but quite confused.  Pleasant and cooperative, seemed receptive to the idea that he needed to stay in the emergency department for a few hours for evaluation.  Work-up is largely unremarkable.  On my repeat evaluation patient is more clinically sober, alert and oriented x4, with full capacity to make decisions.  He is requesting to leave.  Patient continues to have a heart rate in the 110s.  I discussed the risks of leaving and he has agreed to stay, receive continued hydration, food.  If patient were to leave the hospital at this time, it would be an elopement while being full aware of the risk of continued harm or death without a complete evaluation.  I received word from our secretary about patient's father wished to speak with me regarding patient's care.  I asked the patient for permission to do this, he said no he did not want his father involved in any aspect of this medical evaluation.  Patient denies SI, HI, AVH, other than this unintentional overdose he does not appear to be a active threat to himself or others.  He is now with normal vital signs, tachycardia improved, appropriate for discharge.  After the discussed management above, the patient was determined to be safe for discharge.  The patient was in agreement with this plan and all questions regarding their care were answered.  ED return precautions were discussed and the patient will return to the ED with any significant worsening of condition.  Critical Care Documentation Critical care time provided by me (excluding procedures): 32 minutes  Condition necessitating critical care: Mixed overdose  Components of critical care management: reviewing of prior records, laboratory and imaging interpretation, frequent re-examination and reassessment of vital signs, administration of IV fluids, airway  monitoring.     [name], MD 11/15/18 2046

## 2018-11-15 NOTE — Discharge Instructions (Addendum)
You were evaluated in the Emergency Department and after careful evaluation, we did not find any emergent condition requiring admission or further testing in the hospital.  Please use the resources provided to refrain from further drug use.  Please return to the Emergency Department if you experience any worsening of your condition.  We encourage you to follow up with a primary care provider.  Thank you for allowing [place] to be a part of your care.

## 2018-11-20 ENCOUNTER — Encounter (HOSPITAL_COMMUNITY): Payer: Self-pay

## 2018-11-20 ENCOUNTER — Other Ambulatory Visit: Payer: Self-pay

## 2018-11-20 ENCOUNTER — Emergency Department (HOSPITAL_COMMUNITY)
Admission: EM | Admit: 2018-11-20 | Discharge: 2018-11-20 | Disposition: A | Discharge status: Home / Self Care | Payer: Self-pay | Attending: Emergency Medicine | Admitting: Emergency Medicine

## 2018-11-20 DIAGNOSIS — F17210 Nicotine dependence, cigarettes, uncomplicated: Secondary | ICD-10-CM | POA: Insufficient documentation

## 2018-11-20 DIAGNOSIS — Z1159 Encounter for screening for other viral diseases: Secondary | ICD-10-CM | POA: Insufficient documentation

## 2018-11-20 DIAGNOSIS — F1910 Other psychoactive substance abuse, uncomplicated: Secondary | ICD-10-CM | POA: Insufficient documentation

## 2018-11-20 DIAGNOSIS — Z79899 Other long term (current) drug therapy: Secondary | ICD-10-CM | POA: Insufficient documentation

## 2018-11-20 MED ORDER — CHLORDIAZEPOXIDE HCL 25 MG PO CAPS: ORAL_CAPSULE | ORAL | 0 refills | Status: DC

## 2018-11-20 NOTE — Discharge Instructions (Signed)
Please take the Librium taper as directed.,  Do not drive drink alcohol or operate machinery while taking this medication.  Please use the resources provided for you on your discharge paperwork to try to arrange detox.  Peer support was consulted and will also reach out to you in regards to finding a detox center.  Please return to the emergency department for any new or worsening symptoms in the meantime.

## 2018-11-20 NOTE — ED Provider Notes (Signed)
[facility]-EMERGENCY DEPT Provider Note   CSN: [id number] Arrival date & time: 11/20/18  1458    History   Chief Complaint Chief Complaint  Patient presents with  . Drug Problem    HPI Larry Adams is a 33 y.o. male.     HPI   Patient is a 33 year old male with a history of anxiety, drug overdose, EtOH abuse, who presents the emergency department today requesting detox.  States that he was recently a hospital for heroin overdose.  States he has not used heroin since then.  He has used cocaine.  He is mostly here requesting detox for alcohol use.  He is hoping to get him to a drug treatment facility, but was told that he had to go through detox first and needed to have COVID testing.  His last drink was earlier today.  States he drinks about 18 beers daily.  He is currently asymptomatic.  He denies any suicidal or homicidal ideations.  Past Medical History:  Diagnosis Date  . Anxiety   . Back pain   . Drug overdose   . Mental disorder     Patient Active Problem List   Diagnosis Date Noted  . Severe recurrent major depression without psychotic features (HCC) 11/06/2018  . Alcohol abuse with alcohol-induced mood disorder (HCC) 09/12/2017  . MDD (major depressive disorder), recurrent severe, without psychosis (HCC) 09/12/2017  . Benzodiazepine dependence (HCC) 07/23/2014  . Alcohol abuse 07/23/2014  . Substance induced mood disorder (HCC) 07/23/2014  . 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 HERNIA REPAIR       Home Medications    Prior to Admission medications   Medication Sig Start Date End Date Taking? Authorizing Provider  chlordiazePOXIDE (LIBRIUM) 25 MG capsule 50mg  PO TID x 1D, then 25-50mg  PO BID X 1D, then 25-50mg  PO QD X 1D 11/20/18   Darrah Dredge S, PA-C  gabapentin (NEURONTIN) 300 MG capsule Take 1 capsule (300 mg total) by mouth 3 (three) times daily. 11/09/18    [name], MD  mirtazapine (REMERON) 15 MG tablet Take 1 tablet (15 mg total) by mouth at bedtime. Patient not taking: Reported on 11/15/2018 11/09/18   [name], MD  traZODone (DESYREL) 50 MG tablet Take 1 tablet (50 mg total) by mouth at bedtime as needed for sleep. 11/09/18   [name], MD  venlafaxine XR (EFFEXOR-XR) 75 MG 24 hr capsule Take 1 capsule (75 mg total) by mouth daily with breakfast. Patient not taking: Reported on 11/15/2018 11/09/18   [name], MD    Family History No family history on file.  Social History Social History   Tobacco Use  . Smoking status: Current Every Day Smoker    Packs/day: 1.00    Types: Cigarettes  . Smokeless tobacco: Former Engineer, water Use Topics  . Alcohol use: Yes    Comment: PTA 25 oz of beer   . Drug use: Yes    Types: Benzodiazepines, IV, Methamphetamines    Comment: Overdose on Heroin      Allergies   Morphine and related   Review of Systems Review of Systems  Constitutional: Negative for fever.  HENT: Negative for ear pain and sore throat.   Eyes: Negative for visual disturbance.  Respiratory: Negative for cough and shortness of breath.   Cardiovascular: Negative for chest pain.  Gastrointestinal: Negative for abdominal pain and vomiting.  Genitourinary: Negative for dysuria and hematuria.  Musculoskeletal:  Negative for back pain.  Skin: Negative for rash.  Neurological: Negative for headaches.  Psychiatric/Behavioral:       No SI or HI.   All other systems reviewed and are negative.   Physical Exam Updated Vital Signs BP (!) 150/91 (BP Location: Right Arm)   Pulse (!) 114   Temp 98.3 F (36.8 C) (Oral)   Resp 18   Ht 6\' 2"  (1.88 m)   Wt 122 kg   SpO2 99%   BMI 34.53 kg/m   Physical Exam Vitals signs and nursing note reviewed.  Constitutional:      Appearance: He is well-developed.  HENT:     Head: Normocephalic and atraumatic.  Eyes:     Conjunctiva/sclera: Conjunctivae normal.  Neck:      Musculoskeletal: Neck supple.  Cardiovascular:     Rate and Rhythm: Normal rate and regular rhythm.     Pulses: Normal pulses.     Heart sounds: Normal heart sounds. No murmur.  Pulmonary:     Effort: Pulmonary effort is normal.     Breath sounds: Normal breath sounds. No wheezing, rhonchi or rales.  Abdominal:     Palpations: Abdomen is soft.     Tenderness: There is no abdominal tenderness. There is no guarding.  Skin:    General: Skin is warm and dry.  Neurological:     Mental Status: He is alert.  Psychiatric:        Attention and Perception: Attention normal.        Mood and Affect: Mood normal.        Speech: Speech normal.        Behavior: Behavior normal.        Thought Content: Thought content normal.        Judgment: Judgment normal.      ED Treatments / Results  Labs (all labs ordered are listed, but only abnormal results are displayed) Labs Reviewed  NOVEL CORONAVIRUS, NAA (HOSPITAL ORDER, SEND-OUT TO REF LAB)    EKG None  Radiology No results found.  Procedures Procedures (including critical care time)  Medications Ordered in ED Medications - No data to display   Initial Impression / Assessment and Plan / ED Course  I have reviewed the triage vital signs and the nursing notes.  Pertinent labs & imaging results that were available during my care of the patient were reviewed by me and considered in my medical decision making (see chart for details).   Final Clinical Impressions(s) / ED Diagnoses   Final diagnoses:  Polysubstance abuse (HCC)   33 year old male with history of polysubstance abuse and EtOH abuse presenting for request to detox from alcohol.  He is currently asymptomatic at this time.  His last drink was about 2 hours prior to arrival.  Denies any history of complicated withdrawal.  Denies suicidal or homicidal intentions.  He is well-appearing on exam is in no acute distress.  Exam is reassuring other than some tachycardia mild  hypertension which I suspect is related to his EtOH use.  Discussed that I can give him Librium taper to help with withdrawal symptoms.  I also gave him resources for inpatient/outpatient substance use programs.  I also consulted peer support and advised him that they will reach out to him in regards to substance use resources.  He states the facility he would like to go to for rehab is requesting to have COVID testing before he can be admitted.  Will obtain COVID swab.  He voices  understanding of this and is in agreement with plan.  ED Discharge Orders         Ordered    chlordiazePOXIDE (LIBRIUM) 25 MG capsule     11/20/18 1551    Consult to Peer Support    Provider:  (Not yet assigned)   11/20/18 1553           Karrie Meres, PA-C 11/20/18 1559    Loren Racer, MD 11/22/18 2030

## 2018-11-20 NOTE — ED Notes (Signed)
Pt walked out prior to receiving paperwork.

## 2018-11-20 NOTE — ED Triage Notes (Addendum)
Pt states last use of heroin 5/19. Pt states that he wants to go to [facility], but they require him to come here for detox first. Pt requests a covid test as it will help him be able to get into rehab.

## 2018-11-22 LAB — NOVEL CORONAVIRUS, NAA (HOSP ORDER, SEND-OUT TO REF LAB; TAT 18-24 HRS): SARS-CoV-2, NAA: NOT DETECTED

## 2020-01-11 ENCOUNTER — Emergency Department (HOSPITAL_COMMUNITY): Payer: Medicaid Other

## 2020-01-11 ENCOUNTER — Observation Stay (HOSPITAL_COMMUNITY)
Admission: EM | Admit: 2020-01-11 | Discharge: 2020-01-12 | Disposition: A | Discharge status: Home / Self Care | Payer: Medicaid Other | Attending: Internal Medicine | Admitting: Internal Medicine

## 2020-01-11 ENCOUNTER — Encounter (HOSPITAL_COMMUNITY): Payer: Self-pay

## 2020-01-11 ENCOUNTER — Other Ambulatory Visit: Payer: Self-pay

## 2020-01-11 DIAGNOSIS — Z20822 Contact with and (suspected) exposure to covid-19: Secondary | ICD-10-CM | POA: Insufficient documentation

## 2020-01-11 DIAGNOSIS — F17210 Nicotine dependence, cigarettes, uncomplicated: Secondary | ICD-10-CM | POA: Insufficient documentation

## 2020-01-11 DIAGNOSIS — T401X1A Poisoning by heroin, accidental (unintentional), initial encounter: Principal | ICD-10-CM | POA: Insufficient documentation

## 2020-01-11 DIAGNOSIS — J9601 Acute respiratory failure with hypoxia: Secondary | ICD-10-CM | POA: Diagnosis present

## 2020-01-11 DIAGNOSIS — F1910 Other psychoactive substance abuse, uncomplicated: Secondary | ICD-10-CM | POA: Diagnosis present

## 2020-01-11 DIAGNOSIS — J690 Pneumonitis due to inhalation of food and vomit: Secondary | ICD-10-CM | POA: Insufficient documentation

## 2020-01-11 HISTORY — DX: Depression, unspecified: F32.A

## 2020-01-11 LAB — CBC WITH DIFFERENTIAL/PLATELET
Abs Immature Granulocytes: 0.04 10*3/uL (ref 0.00–0.07)
Basophils Absolute: 0.1 10*3/uL (ref 0.0–0.1)
Basophils Relative: 1 %
Eosinophils Absolute: 0.9 10*3/uL — ABNORMAL HIGH (ref 0.0–0.5)
Eosinophils Relative: 9 %
HCT: 41.8 % (ref 39.0–52.0)
Hemoglobin: 14.3 g/dL (ref 13.0–17.0)
Immature Granulocytes: 0 %
Lymphocytes Relative: 26 %
Lymphs Abs: 2.7 10*3/uL (ref 0.7–4.0)
MCH: 32.1 pg (ref 26.0–34.0)
MCHC: 34.2 g/dL (ref 30.0–36.0)
MCV: 93.9 fL (ref 80.0–100.0)
Monocytes Absolute: 1.2 10*3/uL — ABNORMAL HIGH (ref 0.1–1.0)
Monocytes Relative: 12 %
Neutro Abs: 5.3 10*3/uL (ref 1.7–7.7)
Neutrophils Relative %: 52 %
Platelets: 186 10*3/uL (ref 150–400)
RBC: 4.45 MIL/uL (ref 4.22–5.81)
RDW: 12.7 % (ref 11.5–15.5)
WBC: 10.1 10*3/uL (ref 4.0–10.5)
nRBC: 0 % (ref 0.0–0.2)

## 2020-01-11 LAB — CREATININE, SERUM
Creatinine, Ser: 1.05 mg/dL (ref 0.61–1.24)
GFR calc Af Amer: >60 mL/min (ref >60)
GFR calc non Af Amer: >60 mL/min (ref >60)

## 2020-01-11 LAB — BASIC METABOLIC PANEL
Anion gap: 11 (ref 5–15)
BUN: 10 mg/dL (ref 6–20)
CO2: 24 mmol/L (ref 22–32)
Calcium: 8.8 mg/dL — ABNORMAL LOW (ref 8.9–10.3)
Chloride: 103 mmol/L (ref 98–111)
Creatinine, Ser: 0.95 mg/dL (ref 0.61–1.24)
GFR calc Af Amer: >60 mL/min (ref >60)
GFR calc non Af Amer: >60 mL/min (ref >60)
Glucose, Bld: 100 mg/dL — ABNORMAL HIGH (ref 70–99)
Potassium: 3.1 mmol/L — ABNORMAL LOW (ref 3.5–5.1)
Sodium: 138 mmol/L (ref 135–145)

## 2020-01-11 LAB — SARS CORONAVIRUS 2 BY RT PCR (HOSPITAL ORDER, PERFORMED IN ~~LOC~~ HOSPITAL LAB): SARS Coronavirus 2: NEGATIVE

## 2020-01-11 MED ORDER — CLONIDINE HCL 0.1 MG PO TABS
0.1000 mg | ORAL_TABLET | Freq: Once | ORAL | Status: AC
  Administered 2020-01-11: 0.1 mg via ORAL
  Filled 2020-01-11: qty 1

## 2020-01-11 MED ORDER — ONDANSETRON HCL 4 MG/2ML IJ SOLN
4.0000 mg | Freq: Once | INTRAMUSCULAR | Status: AC
  Administered 2020-01-11: 4 mg via INTRAVENOUS
  Filled 2020-01-11: qty 2

## 2020-01-11 MED ORDER — SODIUM CHLORIDE 0.9 % IV SOLN
3.0000 g | Freq: Once | INTRAVENOUS | Status: AC
  Administered 2020-01-11: 3 g via INTRAVENOUS
  Filled 2020-01-11: qty 8

## 2020-01-11 MED ORDER — ONDANSETRON HCL 4 MG PO TABS: 4.0000 mg | ORAL_TABLET | Freq: Four times a day (QID) | ORAL | Status: DC | PRN

## 2020-01-11 MED ORDER — ENOXAPARIN SODIUM 40 MG/0.4ML ~~LOC~~ SOLN
40.0000 mg | SUBCUTANEOUS | Status: DC
  Administered 2020-01-11: 40 mg via SUBCUTANEOUS
  Filled 2020-01-11: qty 0.4

## 2020-01-11 MED ORDER — ONDANSETRON HCL 4 MG/2ML IJ SOLN: 4.0000 mg | Freq: Four times a day (QID) | INTRAMUSCULAR | Status: DC | PRN

## 2020-01-11 NOTE — ED Triage Notes (Signed)
Pt arrived via [facility] for overdose of Heroin. EMS reports finding patient unresponsive , not breathing and blue. EMS reports fire and rescue administered Narcan 2mg  intranasally. EMS reports starting IV and giving Narcan 2mg  IV. EMS reports O2 Sat initially at 56%. Pt was then 86% RA after Narcan. Pt 6L/[place]-O2 Sat @ 91%. Pt placed on Non-breather mask @10L -O2 Sat 97% Patient arrived to ED on room air and alert and oriented x4.

## 2020-01-11 NOTE — ED Provider Notes (Signed)
[facility]-EMERGENCY DEPT Provider Note   CSN: [id number] Arrival date & time: 01/11/20  1936     History Chief Complaint  Patient presents with  . Drug Overdose    Larry Adams is a 34 y.o. male.  Patient arrives after accidental heroin overdose.  Patient states he took IV heroin possibly laced with fentanyl.  Got 4 mg of Narcan with EMS.  He was hypoxic to the 50s upon their arrival blue in the face but never lost pulse.  Patient with immediate improvement following Narcan.  The history is provided by the patient.  Drug Overdose This is a new problem. The current episode started less than 1 hour ago. The problem occurs constantly. The problem has not changed since onset.Pertinent negatives include no chest pain, no abdominal pain, no headaches and no shortness of breath. Relieved by: 4 mg of narcan. He has tried nothing for the symptoms. The treatment provided mild relief.       Past Medical History:  Diagnosis Date  . Anxiety   . Back pain   . Depression   . Drug overdose   . Mental disorder     Patient Active Problem List   Diagnosis Date Noted  . Severe recurrent major depression without psychotic features (HCC) 11/06/2018  . Alcohol abuse with alcohol-induced mood disorder (HCC) 09/12/2017  . MDD (major depressive disorder), recurrent severe, without psychosis (HCC) 09/12/2017  . Benzodiazepine dependence (HCC) 07/23/2014  . Alcohol abuse 07/23/2014  . Substance induced mood disorder (HCC) 07/23/2014  . 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 HERNIA REPAIR         No family history on file.  Social History   Tobacco Use  . Smoking status: Current Every Day Smoker    Packs/day: 1.00    Types: Cigarettes  . Smokeless tobacco: Former Clinical biochemist  . Vaping Use: Former  Substance Use Topics  . Alcohol use: Yes    Alcohol/week: 6.0 standard drinks     Types: 6 Cans of beer per week    Comment: PTA 25 oz of beer   . Drug use: Yes    Types: Benzodiazepines, IV, Methamphetamines    Comment: Overdose on Heroin     Home Medications Prior to Admission medications   Medication Sig Start Date End Date Taking? Authorizing Provider  chlordiazePOXIDE (LIBRIUM) 25 MG capsule 50mg  PO TID x 1D, then 25-50mg  PO BID X 1D, then 25-50mg  PO QD X 1D Patient not taking: Reported on 01/11/2020 11/20/18   Couture, Cortni S, PA-C  gabapentin (NEURONTIN) 300 MG capsule Take 1 capsule (300 mg total) by mouth 3 (three) times daily. Patient not taking: Reported on 01/11/2020 11/09/18   11/11/18, MD  mirtazapine (REMERON) 15 MG tablet Take 1 tablet (15 mg total) by mouth at bedtime. Patient not taking: Reported on 11/15/2018 11/09/18   11/11/18, MD  traZODone (DESYREL) 50 MG tablet Take 1 tablet (50 mg total) by mouth at bedtime as needed for sleep. Patient not taking: Reported on 01/11/2020 11/09/18   11/11/18, MD  venlafaxine XR (EFFEXOR-XR) 75 MG 24 hr capsule Take 1 capsule (75 mg total) by mouth daily with breakfast. Patient not taking: Reported on 11/15/2018 11/09/18   11/11/18, MD    Allergies    Morphine and related  Review of Systems   Review of Systems  Constitutional: Negative for chills and fever.  HENT: Negative for  ear pain and sore throat.   Eyes: Negative for pain and visual disturbance.  Respiratory: Negative for cough and shortness of breath.   Cardiovascular: Negative for chest pain and palpitations.  Gastrointestinal: Negative for abdominal pain and vomiting.  Genitourinary: Negative for dysuria and hematuria.  Musculoskeletal: Negative for arthralgias and back pain.  Skin: Negative for color change and rash.  Neurological: Negative for seizures, syncope and headaches.  All other systems reviewed and are negative.   Physical Exam Updated Vital Signs  ED Triage Vitals  Enc Vitals Group     BP 01/11/20 2010 118/83      Pulse Rate 01/11/20 2010 (!) 110     Resp 01/11/20 2010 16     Temp 01/11/20 2010 98.4 F (36.9 C)     Temp Source 01/11/20 2010 Oral     SpO2 01/11/20 1952 97 %     Weight 01/11/20 2012 260 lb (117.9 kg)     Height 01/11/20 2012 6' (1.829 m)     Head Circumference --      Peak Flow --      Pain Score 01/11/20 2011 7     Pain Loc --      Pain Edu? --      Excl. in GC? --     Physical Exam Vitals and nursing note reviewed.  Constitutional:      General: He is not in acute distress.    Appearance: He is well-developed. He is not ill-appearing.  HENT:     Head: Normocephalic and atraumatic.     Mouth/Throat:     Mouth: Mucous membranes are moist.  Eyes:     Extraocular Movements: Extraocular movements intact.     Conjunctiva/sclera: Conjunctivae normal.     Pupils: Pupils are equal, round, and reactive to light.  Cardiovascular:     Rate and Rhythm: Normal rate and regular rhythm.     Pulses: Normal pulses.     Heart sounds: No murmur heard.   Pulmonary:     Effort: Pulmonary effort is normal. No respiratory distress.     Breath sounds: Normal breath sounds. No stridor. No rhonchi.  Abdominal:     Palpations: Abdomen is soft.     Tenderness: There is no abdominal tenderness.  Musculoskeletal:     Cervical back: Neck supple.  Skin:    General: Skin is warm and dry.  Neurological:     General: No focal deficit present.     Mental Status: He is alert.     ED Results / Procedures / Treatments   Labs (all labs ordered are listed, but only abnormal results are displayed) Labs Reviewed  CBC WITH DIFFERENTIAL/PLATELET - Abnormal; Notable for the following components:      Result Value   Monocytes Absolute 1.2 (*)    Eosinophils Absolute 0.9 (*)    All other components within normal limits  BASIC METABOLIC PANEL - Abnormal; Notable for the following components:   Potassium 3.1 (*)    Glucose, Bld 100 (*)    Calcium 8.8 (*)    All other components within normal  limits  SARS CORONAVIRUS 2 BY RT PCR (HOSPITAL ORDER, PERFORMED IN [facility] LAB)    EKG None  Radiology DG Chest Portable 1 View  Result Date: 01/11/2020 CLINICAL DATA:  Aspiration EXAM: PORTABLE CHEST 1 VIEW COMPARISON:  Nov 15, 2018 FINDINGS: There are hazy bibasilar airspace opacities, right worse than left. No pneumothorax. The heart size is mildly enlarged but  stable from prior study. There is no pneumothorax. No significant pleural effusion. No acute osseous abnormality. IMPRESSION: Hazy bibasilar airspace opacities, right worse than left, may represent atelectasis or developing infiltrates. Electronically Signed   By: Katherine Mantle M.D.   On: 01/11/2020 21:19    Procedures .Critical Care Performed by: Virgina Norfolk, DO Authorized by: Virgina Norfolk, DO   Critical care provider statement:    Critical care time (minutes):  35   Critical care was necessary to treat or prevent imminent or life-threatening deterioration of the following conditions:  Respiratory failure   Critical care was time spent personally by me on the following activities:  Blood draw for specimens, development of treatment plan with patient or surrogate, discussions with consultants, evaluation of patient's response to treatment, examination of patient, obtaining history from patient or surrogate, ordering and performing treatments and interventions, ordering and review of laboratory studies, ordering and review of radiographic studies, pulse oximetry, re-evaluation of patient's condition and review of old charts   I assumed direction of critical care for this patient from another provider in my specialty: no     (including critical care time)  Medications Ordered in ED Medications  Ampicillin-Sulbactam (UNASYN) 3 g in sodium chloride 0.9 % 100 mL IVPB (has no administration in time range)  cloNIDine (CATAPRES) tablet 0.1 mg (0.1 mg Oral Given 01/11/20 2028)  ondansetron (ZOFRAN) injection 4 mg  (4 mg Intravenous Given 01/11/20 2028)    ED Course  I have reviewed the triage vital signs and the nursing notes.  Pertinent labs & imaging results that were available during my care of the patient were reviewed by me and considered in my medical decision making (see chart for details).    MDM Rules/Calculators/A&P                          Doroteo Nickolson is a 34 year old male with history of substance abuse who presents to the ED after accidental heroin overdose.  Normal vitals upon arrival.  No hypoxia, no respiratory distress.  Got 4 mg of IV Narcan from fire and EMS.  Was hypoxic and blue upon arrival of EMS but immediately improved after Narcan.  Patient arrives on room air, GCS of 15.  Admits to IV heroin use.  Frequent abuser.  Has been to rehab in the past.  Will labs and anticipate discharge to home.  Will give Zofran and clonidine for withdrawal symptoms.  Patient was given Narcan by EMS to take home.  He has resources for rehab.  Upon reevaluation patient is awake and alert however oxygen 85%.  This was persistent.  Suspect aspiration event.  Chest x-ray was obtained that shows hazy bibasilar airspace opacities worse on the right.  Likely developing aspiration pneumonia given today's events.  Will give IV Unasyn.  No significant leukocytosis or anemia.  Will get Covid test and admit patient for likely aspiration pneumonia in the setting of opiate overdose.  This chart was dictated using voice recognition software.  Despite best efforts to proofread,  errors can occur which can change the documentation meaning.    Final Clinical Impression(s) / ED Diagnoses Final diagnoses:  Accidental overdose of heroin, initial encounter (HCC)  Aspiration pneumonia, unspecified aspiration pneumonia type, unspecified laterality, unspecified part of lung (HCC)  Acute respiratory failure with hypoxia [facility])    Rx / DC Orders ED Discharge Orders    None       Virgina Norfolk, DO 01/11/20  2126  

## 2020-01-11 NOTE — H&P (Signed)
History and Physical    Matthe Sloane QMV:[id number] DOB: 1986/01/08 DOA: 01/11/2020  PCP: Patient, No Pcp Per  Patient coming from: Patient is homeless.  Chief Complaint: Drug overdose.  HPI: Larry Adams is a 34 y.o. male with history of IV heroin abuse was found to be unresponsive and EMS was called.  As per the report patient was given 2 mg Narcan intranasally and following which IV Narcan was given patient became more alert awake but with hypoxia on was brought to the ER.  Patient denies any chest pain or shortness of breath but is mildly drowsy.  Was found to be hypoxic.  ED Course: In the ER patient is hypoxic requiring initially nonrebreather presently on 2 L oxygen.  Chest x-ray showed bilateral infiltrates.  Covid test is negative.  Labs are largely unremarkable except for potassium of 3.1.  Given the hypoxia patient admitted for further observation  Review of Systems: As per HPI, rest all negative.   Past Medical History:  Diagnosis Date  . Anxiety   . Back pain   . Depression   . Drug overdose   . Mental disorder     Past Surgical History:  Procedure Laterality Date  . HERNIA REPAIR       reports that he has been smoking cigarettes. He has been smoking about 1.00 pack per day. He has quit using smokeless tobacco. He reports current alcohol use of about 6.0 standard drinks of alcohol per week. He reports current drug use. Drugs: Benzodiazepines, IV, and Methamphetamines.  Allergies  Allergen Reactions  . Morphine And Related Hives    Family History  Family history unknown: Yes    Prior to Admission medications   Medication Sig Start Date End Date Taking? Authorizing Provider  chlordiazePOXIDE (LIBRIUM) 25 MG capsule 50mg  PO TID x 1D, then 25-50mg  PO BID X 1D, then 25-50mg  PO QD X 1D Patient not taking: Reported on 01/11/2020 11/20/18   Couture, Cortni S, PA-C  gabapentin (NEURONTIN) 300 MG capsule Take 1 capsule (300 mg total) by mouth 3 (three) times  daily. Patient not taking: Reported on 01/11/2020 11/09/18   11/11/18, MD  mirtazapine (REMERON) 15 MG tablet Take 1 tablet (15 mg total) by mouth at bedtime. Patient not taking: Reported on 11/15/2018 11/09/18   11/11/18, MD  traZODone (DESYREL) 50 MG tablet Take 1 tablet (50 mg total) by mouth at bedtime as needed for sleep. Patient not taking: Reported on 01/11/2020 11/09/18   11/11/18, MD  venlafaxine XR (EFFEXOR-XR) 75 MG 24 hr capsule Take 1 capsule (75 mg total) by mouth daily with breakfast. Patient not taking: Reported on 11/15/2018 11/09/18   11/11/18, MD    Physical Exam: Constitutional: Moderately built and nourished. Vitals:   01/11/20 2004 01/11/20 2010 01/11/20 2012 01/11/20 2028  BP:  118/83  (!) 115/57  Pulse:  (!) 110    Resp:  16    Temp:  98.4 F (36.9 C)    TempSrc:  Oral    SpO2: 94% 96%    Weight:   117.9 kg   Height:   6' (1.829 m)    Eyes: Anicteric no pallor. ENMT: No discharge from the ears eyes nose or mouth. Neck: No mass felt.  No neck rigidity. Respiratory: No rhonchi or crepitations. Cardiovascular: S1-S2 heard. Abdomen: Soft nontender bowel sounds positive. Musculoskeletal: No edema. Skin: Psoriatic lesions. Neurologic: Mildly drowsy but oriented to time place and person.  Moves all extremities. Psychiatric: Denies any suicidal  ideation.   Labs on Admission: I have personally reviewed following labs and imaging studies  CBC: Recent Labs  Lab 01/11/20 2106  WBC 10.1  NEUTROABS 5.3  HGB 14.3  HCT 41.8  MCV 93.9  PLT 186   Basic Metabolic Panel: Recent Labs  Lab 01/11/20 2106  NA 138  K 3.1*  CL 103  CO2 24  GLUCOSE 100*  BUN 10  CREATININE 0.95  CALCIUM 8.8*   GFR: Estimated Creatinine Clearance: 145.2 mL/min (by C-G formula based on SCr of 0.95 mg/dL). Liver Function Tests: No results for input(s): AST, ALT, ALKPHOS, BILITOT, PROT, ALBUMIN in the last 168 hours. No results for input(s): LIPASE, AMYLASE in the  last 168 hours. No results for input(s): AMMONIA in the last 168 hours. Coagulation Profile: No results for input(s): INR, PROTIME in the last 168 hours. Cardiac Enzymes: No results for input(s): CKTOTAL, CKMB, CKMBINDEX, TROPONINI in the last 168 hours. BNP (last 3 results) No results for input(s): PROBNP in the last 8760 hours. HbA1C: No results for input(s): HGBA1C in the last 72 hours. CBG: No results for input(s): GLUCAP in the last 168 hours. Lipid Profile: No results for input(s): CHOL, HDL, LDLCALC, TRIG, CHOLHDL, LDLDIRECT in the last 72 hours. Thyroid Function Tests: No results for input(s): TSH, T4TOTAL, FREET4, T3FREE, THYROIDAB in the last 72 hours. Anemia Panel: No results for input(s): VITAMINB12, FOLATE, FERRITIN, TIBC, IRON, RETICCTPCT in the last 72 hours. Urine analysis:    Component Value Date/Time   COLORURINE AMBER (A) 11/15/2018 1903   APPEARANCEUR HAZY (A) 11/15/2018 1903   LABSPEC 1.023 11/15/2018 1903   PHURINE 5.0 11/15/2018 1903   GLUCOSEU NEGATIVE 11/15/2018 1903   HGBUR NEGATIVE 11/15/2018 1903   BILIRUBINUR NEGATIVE 11/15/2018 1903   KETONESUR 5 (A) 11/15/2018 1903   PROTEINUR NEGATIVE 11/15/2018 1903   UROBILINOGEN 0.2 12/18/2012 0113   NITRITE NEGATIVE 11/15/2018 1903   LEUKOCYTESUR NEGATIVE 11/15/2018 1903   Sepsis Labs: @LABRCNTIP (procalcitonin:4,lacticidven:4) )No results found for this or any previous visit (from the past 240 hour(s)).   Radiological Exams on Admission: DG Chest Portable 1 View  Result Date: 01/11/2020 CLINICAL DATA:  Aspiration EXAM: PORTABLE CHEST 1 VIEW COMPARISON:  Nov 15, 2018 FINDINGS: There are hazy bibasilar airspace opacities, right worse than left. No pneumothorax. The heart size is mildly enlarged but stable from prior study. There is no pneumothorax. No significant pleural effusion. No acute osseous abnormality. IMPRESSION: Hazy bibasilar airspace opacities, right worse than left, may represent atelectasis or  developing infiltrates. Electronically Signed   By: Nov 17, 2018 M.D.   On: 01/11/2020 21:19     Assessment/Plan Principal Problem:   Acute respiratory failure with hypoxia (HCC) Active Problems:   Polysubstance abuse (HCC)    1. Acute respiratory failure with hypoxia secondary to possible aspiration pneumonia.  On Unasyn.  Continue to observe. 2. Polysubstance abuse including IV drugs for which we will closely monitor.  Patient did receive a total of 4 mg Narcan.  2 mg of intranasal 2 mg IV.  Potassium mildly lethargic but oriented to time place and person.  Social work consult. 3. Mild hypokalemia replace and recheck. 4. History of psoriasis. 5. History of depression denies any suicidal ideation.   DVT prophylaxis: Lovenox. Code Status: Full code. Family Communication: Discussed with patient. Disposition Plan: To be determined. Consults called: Social work. Admission status: Observation.   01/13/2020 MD [facility] Pager [phone].  If 7PM-7AM, please contact night-coverage [email or web address] Password [facility]  01/11/2020, 9:52  PM

## 2020-01-12 LAB — RAPID URINE DRUG SCREEN, HOSP PERFORMED
Amphetamines: NOT DETECTED
Barbiturates: NOT DETECTED
Benzodiazepines: POSITIVE — AB
Cocaine: POSITIVE — AB
Opiates: NOT DETECTED
Tetrahydrocannabinol: NOT DETECTED

## 2020-01-12 LAB — CBC
HCT: 39.1 % (ref 39.0–52.0)
Hemoglobin: 13.3 g/dL (ref 13.0–17.0)
MCH: 32 pg (ref 26.0–34.0)
MCHC: 34 g/dL (ref 30.0–36.0)
MCV: 94 fL (ref 80.0–100.0)
Platelets: 167 10*3/uL (ref 150–400)
RBC: 4.16 MIL/uL — ABNORMAL LOW (ref 4.22–5.81)
RDW: 13 % (ref 11.5–15.5)
WBC: 9.1 10*3/uL (ref 4.0–10.5)
nRBC: 0 % (ref 0.0–0.2)

## 2020-01-12 LAB — BASIC METABOLIC PANEL
Anion gap: 6 (ref 5–15)
BUN: 7 mg/dL (ref 6–20)
CO2: 26 mmol/L (ref 22–32)
Calcium: 8.7 mg/dL — ABNORMAL LOW (ref 8.9–10.3)
Chloride: 104 mmol/L (ref 98–111)
Creatinine, Ser: 0.89 mg/dL (ref 0.61–1.24)
GFR calc Af Amer: >60 mL/min (ref >60)
GFR calc non Af Amer: >60 mL/min (ref >60)
Glucose, Bld: 94 mg/dL (ref 70–99)
Potassium: 3.7 mmol/L (ref 3.5–5.1)
Sodium: 136 mmol/L (ref 135–145)

## 2020-01-12 LAB — HIV ANTIBODY (ROUTINE TESTING W REFLEX): HIV Screen 4th Generation wRfx: NONREACTIVE

## 2020-01-12 LAB — STREP PNEUMONIAE URINARY ANTIGEN: Strep Pneumo Urinary Antigen: NEGATIVE

## 2020-01-12 MED ORDER — AMOXICILLIN-POT CLAVULANATE 875-125 MG PO TABS
1.0000 | ORAL_TABLET | Freq: Two times a day (BID) | ORAL | 0 refills | Status: AC
Start: 2020-01-12 — End: 2020-01-18

## 2020-01-12 MED ORDER — SODIUM CHLORIDE 0.9 % IV SOLN
3.0000 g | Freq: Four times a day (QID) | INTRAVENOUS | Status: DC
  Administered 2020-01-12: 3 g via INTRAVENOUS
  Filled 2020-01-12: qty 8

## 2020-01-12 NOTE — ED Notes (Signed)
Patent keeps requesting to eat, unable to hold a conversation without falling back asleep. Patient falls asleep while on the phone, RN keeps reminding him to keep his O2 in his nose.

## 2020-01-12 NOTE — ED Notes (Signed)
Assumed care of pt at this time. Pt resting in stretcher, no s/sx of acute distress at this time. On cont cardiac monitoring. Pt wakes to verbal stimuli

## 2020-01-12 NOTE — Progress Notes (Signed)
Pharmacy Antibiotic Note  Larry Adams is a 34 y.o. male admitted on 01/11/2020 with drug overdose.  Pharmacy has been consulted for ampicillin/sulbactam dosing for aspiration PNA.  Today, 01/12/20  WBC WNL  SCr 0.9, CrCl >100 mL/min  Afebrile  Plan:  Continue ampicillin/sulbactam 3 g IV q6h  Renal function WNL. Pharmacy to sign off, please re-consult if needed.   Height: 6' (182.9 cm) Weight: 117.9 kg (260 lb) IBW/kg (Calculated) : 77.6  Temp (24hrs), Avg:98.4 F (36.9 C), Min:98.4 F (36.9 C), Max:98.4 F (36.9 C)  Recent Labs  Lab 01/11/20 2003 01/11/20 2106 01/12/20 0446  WBC  --  10.1 9.1  CREATININE 1.05 0.95 0.89    Estimated Creatinine Clearance: 155 mL/min (by C-G formula based on SCr of 0.89 mg/dL).    Allergies  Allergen Reactions  . Morphine And Related Hives    Antimicrobials this admission: Ampicillin/sulbactam 7/15 >>   Dose adjustments this admission:   Thank you for allowing pharmacy to be a part of this patient's care.  Cindi Carbon, PharmD 01/12/2020 11:22 AM

## 2020-01-12 NOTE — Progress Notes (Signed)
Pharmacy Antibiotic Note  Larry Adams is a 34 y.o. male admitted on 01/11/2020 with pneumonia.  Pharmacy has been consulted for Unasyn dosing.  Plan: Unasyn 3gm iv q6hr  Height: 6' (182.9 cm) Weight: 117.9 kg (260 lb) IBW/kg (Calculated) : 77.6  Temp (24hrs), Avg:98.4 F (36.9 C), Min:98.4 F (36.9 C), Max:98.4 F (36.9 C)  Recent Labs  Lab 01/11/20 2003 01/11/20 2106 01/12/20 0446  WBC  --  10.1 9.1  CREATININE 1.05 0.95 0.89    Estimated Creatinine Clearance: 155 mL/min (by C-G formula based on SCr of 0.89 mg/dL).    Allergies  Allergen Reactions   Morphine And Related Hives    Antimicrobials this admission: Unasyn 01/11/2020 >>  Dose adjustments this admission:   Microbiology results:   Thank you for allowing pharmacy to be a part of this patients care.  Aleene Davidson Crowford 01/12/2020 6:01 AM

## 2020-01-12 NOTE — ED Notes (Addendum)
Patient on RA at 97% alert and oriented x4. Patient verbalizing need for discharge, MD messaged. Eating lunch and then patient is planning on leaving. Phone number updated in chart for SW f/u.

## 2020-01-12 NOTE — Social Work (Signed)
Consult request has been received. CSW attempting to follow up at present time. °  °CSW will continue to follow for dc needs. ° °Zareena Willis Tarpley-Carter, MSW, LCSW-A °[facility] °Transitions of Care Clinical Social Worker °[place] °[phone] °

## 2020-01-12 NOTE — Discharge Instructions (Signed)
Accidental Drug Poisoning, Adult Accidental drug poisoning happens when a person accidentally takes too much of a substance, such as a prescription medicine, an over-the-counter medicine, a vitamin, a supplement, or an illegal drug. The effects of drug poisoning can be mild, dangerous, or even deadly. What are the causes? This condition is caused by taking too much of a medicine, illegal drug, or other substance. It often results from:  Lack of knowledge about a substance.  Using more than one substance at the same time.  An error made by the health care provider who prescribed the substance.  An error made by the pharmacist who filled the prescription.  A lapse in memory, such as forgetting that you have already taken a dose of the medicine.  Suddenly using a substance after a long period of not using it. The following substances and medicines are more likely to cause an accidental drug poisoning:  Medicines that treat mental problems (psychotropic medicines).  Pain medicines.  Cocaine.  Heroin.  Multivitamins that contain iron.  Over-the-counter cold and cough medicines. What increases the risk? This condition is more likely to occur in:  Elderly adults. Elderly adults are at risk because they may: ? Be taking many different medicines. ? Have difficulty reading labels. ? Forget when they last took their medicine.  People who use illegal drugs.  People who drink alcohol while using illegal drugs or certain medicines.  People with certain mental health conditions. What are the signs or symptoms? Symptoms of this condition depend on the substance and the amount that was taken. Common symptoms include:  Behavior changes, such as confusion.  Sleepiness.  Weakness.  Slowed breathing.  Nausea and vomiting.  Seizures.  Very large or small eye pupil size. A drug poisoning can cause a very serious condition in which your blood pressure drops to a low level (shock).  Symptoms of shock include:  Cold and clammy skin.  Pale skin.  Blue lips.  Very slow breathing.  Extreme sleepiness.  Severe confusion.  Dizziness or fainting. How is this diagnosed? This condition is diagnosed based on:  Your symptoms. You will be asked about the substances you took and when you took them.  A physical exam. You may also have other tests, including:  Urine tests.  Blood tests.  An electrocardiogram (ECG). How is this treated? This condition may need to be treated right away at the hospital. Treatment may involve:  Getting fluids and electrolytes through an IV.  Having a breathing tube inserted in your airway (endotracheal tube) to help you breathe.  Taking medicines. These may include medicines that: ? Absorb any substance that is in your digestive system. ? Block or reverse the effect of the substance that caused the drug poisoning.  Having your blood filtered through an artificial kidney machine (hemodialysis).  Ongoing counseling and mental health support. This may be provided if you used an illegal drug. Follow these instructions at home: Medicines   Take over-the-counter and prescription medicines only as told by your health care provider.  Before taking a new medicine, ask your health care provider whether the medicine: ? May cause side effects. ? Might react with other medicines.  Keep a list of all the medicines that you take, including over-the-counter medicines, vitamins, supplements, and herbs. Bring this list with you to all of your medical visits. General instructions   Drink enough fluid to keep your urine pale yellow.  If you are working with a Social worker or Education officer, community, make  sure to follow his or her instructions.  Do not drink alcohol if: ? Your health care provider tells you not to drink. ? You are pregnant, may be pregnant, or are planning to become pregnant.  If you drink alcohol, limit how much you  have: ? 0-1 drink a day for women. ? 0-2 drinks a day for men.  Be aware of how much alcohol is in your drink. In the [place] one drink equals one typical bottle of beer (12 oz), one-half glass of wine (5 oz), or one shot of hard liquor (1 oz).  Keep all follow-up visits as told by your health care provider. This is important. How is this prevented?   Get help if you are struggling with: ? Alcohol or drug use. ? Depression or another mental health problem.  Keep the phone number of your local poison control center near your phone or on your cell phone. The hotline of the [employer] is (800260-504-4929.  Store all medicines in safety containers that are out of the reach of children.  Read the drug inserts that come with your medicines.  Create a system for taking your medicine, such as a pillbox, that will help you avoid taking too much of the medicine.  Do not drink alcohol while taking medicines unless your health care provider approves.  Do not use illegal drugs.  Do not take medicines that are not prescribed for you. Contact a health care provider if:  Your symptoms return.  You develop new symptoms or side effects after taking a medicine.  You have questions about possible drug poisoning. Call your local poison control center at [phone]. Get help right away if:  You think that you or someone else may have taken too much of a substance.  You or someone else is having symptoms of drug poisoning. Summary  Accidental drug poisoning happens when a person accidentally takes too much of a substance, such as a prescription medicine, an over-the-counter medicine, a vitamin, a supplement, or an illegal drug.  The effects of drug poisoning can be mild, dangerous, or even deadly.  This condition is diagnosed based on your symptoms and a physical exam. You will be asked to tell your health care provider which substances you took and when you  took them.  This condition may need to be treated right away at the hospital. This information is not intended to replace advice given to you by your health care provider. Make sure you discuss any questions you have with your health care provider. Document Revised: 05/28/2017 Document Reviewed: 05/17/2017 Elsevier Patient Education  2020 [employer].  Recovering From Addiction Addiction is a complex disease of the brain. It causes an uncontrollable (compulsive) need for a substance. You can be addicted to substances including alcohol, tobacco, illegal drugs, or prescription medicines such as painkillers. Addiction can change the way that your brain works. It affects memory, behavior, and how you make decisions. Without treatment, addiction can get worse. However, with treatment and lifestyle changes, you can recover from addiction. What types of treatment are available? The treatment program that is right for you will depend on many factors, including the type of addiction that you have. Treatment programs can be outpatient or inpatient. In an outpatient program, you live at home and go to work or school, but you also go to a clinic for treatment. With an inpatient program, you live and sleep at the program facility during treatment. Other treatment  options include:  Medicine. ? Some addictions may be treated with prescription medicines. ? You may also need medicine to treat other mental health conditions such as anxiety or depression.  Counseling and behavior therapy. Therapy can help you learn new ways to respond to situations that are stressful or that tempt you to use the addictive substance.  Support groups. These include therapy groups and 12-step programs. These can help individuals and families during treatment and recovery. Examples of 12-step programs are Alcoholics Anonymous (AA) and Narcotics Anonymous (NA). How to manage lifestyle changes Managing stress Too much stress can  lead to returning to the addiction (having a relapse). You need to find effective ways to manage your stress. Some techniques to cope with stress include:  Meditation, yoga, or deep breathing.  Exercise. Create an exercise routine that you enjoy and that allows you to work off some energy.  Creating or listening to music.  Muscle relaxation exercises.  Medicines Some medicines may make you feel calmer and help you have fewer cravings. If your health care provider prescribes medicines, make sure you:  Avoid using alcohol and other substances that may prevent your medicines from working properly (may interact).  Talk with your pharmacist or health care provider about all medicines that you take, the possible problems (side effects) that they can cause, and which medicines are safe to take together.  Make it your goal to take part in all treatment decisions (shared decision-making). Ask about possible side effects of medicines that your health care provider recommends, and tell him or her how you feel about having those side effects. It is best if shared decision-making with your health care provider is part of your total treatment plan. Relationships Supportive relationships are very important in your recovery. When you are recovering from drug addiction, it will be important to avoid being around people who use drugs. For many people, this means developing new and different relationships. Some ways to do this include:  Developing trusting relationships with the people you meet in treatment or in AA or NA. These people share your desire to stop using substances (get sober) and to stay sober.  Getting a sponsor as a primary support person if you are attending a 12-step program.  Building relationships with people you meet through activities such as hobbies, volunteering, or exercising. How to recognize changes in your condition When recovering from an addiction, it is very common for a person  to relapse and start using the substance again. Contact your sponsor, therapist, or health care provider to seek additional help if you experience the following:  Anxiety.  Excessive anger.  Isolating yourself from others.  Trouble sleeping.  Feeling depressed.  Loss of appetite.  Fantasies about using the substance. Where to find support Talking to others  You may be advised to see a family therapist along with members of your family. Family therapy can help you and your family understand what led you to addiction. Talk with your family about this approach.  Let your family members or friends know that they can help you through treatment. Support from loved ones will be important to help you maintain positive changes. Financial resources Be sure to check with your insurance carrier to find out what treatment options are covered by your plan. You may also be able to find financial assistance through not-for-profit organizations or with local government-based resources. If you are taking medicines, you may be able to get the generic form, which may be less expensive  than brand-name medicine. Some makers of prescription medicines also offer help to patients who cannot afford the medicines that they need. Follow these instructions at home:  Take over-the-counter and prescription medicines only as told by your health care provider.  Stay in treatment until you complete the program. Take an active role in your treatment and your physical and emotional self-care. Develop a follow-up plan.  Keep all follow-up visits as told by your health care provider and counselor. This is important.  Eat a healthy diet, exercise regularly, and get enough sleep. Questions to ask your health care provider  If you are taking medicines: ? How long do I need to take medicine? ? Are there any long-term side effects of my medicine? ? Are there other options instead of taking medicine?  Would I benefit  from therapy?  How often should I follow up with a health care provider? Contact a health care provider if:  You feel like you might relapse.  You have stopped taking your medicine. Get help right away if:  You have serious thoughts about hurting yourself or others. If you ever feel like you may hurt yourself or others, or have thoughts about taking your own life, get help right away. You can go to your nearest emergency department or call:  Your local emergency services (911 in the [place]  A suicide crisis helpline, such as the National Suicide Prevention Lifeline at 626-271-0508. This is open 24 hours a day. Summary  With treatment and lifestyle changes, it is possible to recover from an addiction to substances like alcohol, tobacco, illegal drugs, or prescription medicines such as painkillers.  Find effective ways to manage your stress to avoid a relapse. Some techniques to cope with stress include exercise, meditation, yoga, and deep breathing.  Let loved ones know that their support is important to help you recover.  If you have any signs that you may relapse, contact your 12-step sponsor, therapist, or health care provider to seek additional help. This information is not intended to replace advice given to you by your health care provider. Make sure you discuss any questions you have with your health care provider. Document Revised: 03/10/2019 Document Reviewed: 10/30/2016 Elsevier Patient Education  2020 [employer].

## 2020-01-14 LAB — LEGIONELLA PNEUMOPHILA SEROGP 1 UR AG: L. pneumophila Serogp 1 Ur Ag: NEGATIVE

## 2020-03-27 ENCOUNTER — Ambulatory Visit (HOSPITAL_COMMUNITY): Payer: Self-pay | Admitting: Psychiatry

## 2020-04-17 ENCOUNTER — Inpatient Hospital Stay (HOSPITAL_COMMUNITY)
Admission: AD | Admit: 2020-04-17 | Discharge: 2020-04-22 | DRG: 885 | Disposition: A | Discharge status: Home / Self Care | Payer: Federal, State, Local not specified - Other | Source: Other Acute Inpatient Hospital | Attending: Psychiatry | Admitting: Psychiatry

## 2020-04-17 ENCOUNTER — Other Ambulatory Visit: Payer: Self-pay

## 2020-04-17 ENCOUNTER — Encounter (HOSPITAL_COMMUNITY): Payer: Self-pay | Admitting: Emergency Medicine

## 2020-04-17 ENCOUNTER — Emergency Department (HOSPITAL_COMMUNITY)
Admission: EM | Admit: 2020-04-17 | Discharge: 2020-04-17 | Disposition: A | Discharge status: Other Acute Inpatient Hospital | Payer: Self-pay | Attending: Emergency Medicine | Admitting: Emergency Medicine

## 2020-04-17 ENCOUNTER — Encounter (HOSPITAL_COMMUNITY): Payer: Self-pay | Admitting: Psychiatry

## 2020-04-17 DIAGNOSIS — Z5900 Homelessness unspecified: Secondary | ICD-10-CM | POA: Diagnosis not present

## 2020-04-17 DIAGNOSIS — R45851 Suicidal ideations: Secondary | ICD-10-CM | POA: Insufficient documentation

## 2020-04-17 DIAGNOSIS — F17210 Nicotine dependence, cigarettes, uncomplicated: Secondary | ICD-10-CM | POA: Insufficient documentation

## 2020-04-17 DIAGNOSIS — G4700 Insomnia, unspecified: Secondary | ICD-10-CM | POA: Diagnosis present

## 2020-04-17 DIAGNOSIS — F332 Major depressive disorder, recurrent severe without psychotic features: Secondary | ICD-10-CM | POA: Insufficient documentation

## 2020-04-17 DIAGNOSIS — F1020 Alcohol dependence, uncomplicated: Secondary | ICD-10-CM | POA: Insufficient documentation

## 2020-04-17 DIAGNOSIS — E876 Hypokalemia: Secondary | ICD-10-CM | POA: Diagnosis present

## 2020-04-17 DIAGNOSIS — Z20822 Contact with and (suspected) exposure to covid-19: Secondary | ICD-10-CM | POA: Insufficient documentation

## 2020-04-17 DIAGNOSIS — F1910 Other psychoactive substance abuse, uncomplicated: Secondary | ICD-10-CM | POA: Insufficient documentation

## 2020-04-17 DIAGNOSIS — Z79899 Other long term (current) drug therapy: Secondary | ICD-10-CM | POA: Diagnosis not present

## 2020-04-17 DIAGNOSIS — F419 Anxiety disorder, unspecified: Secondary | ICD-10-CM | POA: Diagnosis present

## 2020-04-17 LAB — URINALYSIS, ROUTINE W REFLEX MICROSCOPIC
Bilirubin Urine: NEGATIVE
Glucose, UA: NEGATIVE mg/dL
Hgb urine dipstick: NEGATIVE
Ketones, ur: NEGATIVE mg/dL
Leukocytes,Ua: NEGATIVE
Nitrite: NEGATIVE
Protein, ur: NEGATIVE mg/dL
Specific Gravity, Urine: 1.002 — ABNORMAL LOW (ref 1.005–1.030)
pH: 6 (ref 5.0–8.0)

## 2020-04-17 LAB — COMPREHENSIVE METABOLIC PANEL
ALT: 29 U/L (ref 0–44)
AST: 52 U/L — ABNORMAL HIGH (ref 15–41)
Albumin: 3.4 g/dL — ABNORMAL LOW (ref 3.5–5.0)
Alkaline Phosphatase: 85 U/L (ref 38–126)
Anion gap: 12 (ref 5–15)
BUN: 5 mg/dL — ABNORMAL LOW (ref 6–20)
CO2: 24 mmol/L (ref 22–32)
Calcium: 9.2 mg/dL (ref 8.9–10.3)
Chloride: 104 mmol/L (ref 98–111)
Creatinine, Ser: 0.84 mg/dL (ref 0.61–1.24)
GFR, Estimated: >60 mL/min (ref >60)
Glucose, Bld: 99 mg/dL (ref 70–99)
Potassium: 3.3 mmol/L — ABNORMAL LOW (ref 3.5–5.1)
Sodium: 140 mmol/L (ref 135–145)
Total Bilirubin: 1.5 mg/dL — ABNORMAL HIGH (ref 0.3–1.2)
Total Protein: 7.2 g/dL (ref 6.5–8.1)

## 2020-04-17 LAB — ACETAMINOPHEN LEVEL: Acetaminophen (Tylenol), Serum: <10 ug/mL — ABNORMAL LOW (ref 10–30)

## 2020-04-17 LAB — RAPID URINE DRUG SCREEN, HOSP PERFORMED
Amphetamines: NOT DETECTED
Barbiturates: NOT DETECTED
Benzodiazepines: NOT DETECTED
Cocaine: NOT DETECTED
Opiates: NOT DETECTED
Tetrahydrocannabinol: NOT DETECTED

## 2020-04-17 LAB — SALICYLATE LEVEL: Salicylate Lvl: <7 mg/dL — ABNORMAL LOW (ref 7.0–30.0)

## 2020-04-17 LAB — RESPIRATORY PANEL BY RT PCR (FLU A&B, COVID)
Influenza A by PCR: NEGATIVE
Influenza B by PCR: NEGATIVE
SARS Coronavirus 2 by RT PCR: NEGATIVE

## 2020-04-17 LAB — ETHANOL: Alcohol, Ethyl (B): 205 mg/dL — ABNORMAL HIGH (ref <10)

## 2020-04-17 MED ORDER — ACETAMINOPHEN 325 MG PO TABS
650.0000 mg | ORAL_TABLET | Freq: Four times a day (QID) | ORAL | Status: DC | PRN
  Administered 2020-04-18 – 2020-04-20 (×4): 650 mg via ORAL
  Filled 2020-04-17 (×4): qty 2

## 2020-04-17 MED ORDER — LOPERAMIDE HCL 2 MG PO CAPS: 2.0000 mg | ORAL_CAPSULE | ORAL | Status: AC | PRN

## 2020-04-17 MED ORDER — ONDANSETRON 4 MG PO TBDP
4.0000 mg | ORAL_TABLET | Freq: Four times a day (QID) | ORAL | Status: AC | PRN
  Administered 2020-04-18 – 2020-04-19 (×2): 4 mg via ORAL
  Filled 2020-04-17 (×2): qty 1

## 2020-04-17 MED ORDER — LORAZEPAM 2 MG/ML IJ SOLN: 0.0000 mg | Freq: Four times a day (QID) | INTRAMUSCULAR | Status: DC

## 2020-04-17 MED ORDER — THIAMINE HCL 100 MG/ML IJ SOLN: 100.0000 mg | Freq: Every day | INTRAMUSCULAR | Status: DC

## 2020-04-17 MED ORDER — MAGNESIUM HYDROXIDE 400 MG/5ML PO SUSP
30.0000 mL | Freq: Every day | ORAL | Status: DC | PRN
  Administered 2020-04-20: 30 mL via ORAL
  Filled 2020-04-17 (×2): qty 30

## 2020-04-17 MED ORDER — HYDROXYZINE HCL 25 MG PO TABS
25.0000 mg | ORAL_TABLET | Freq: Three times a day (TID) | ORAL | Status: DC | PRN
  Administered 2020-04-18 – 2020-04-21 (×7): 25 mg via ORAL
  Filled 2020-04-17 (×7): qty 1
  Filled 2020-04-17: qty 10
  Filled 2020-04-17: qty 1

## 2020-04-17 MED ORDER — TRAZODONE HCL 50 MG PO TABS
50.0000 mg | ORAL_TABLET | Freq: Every evening | ORAL | Status: DC | PRN
  Administered 2020-04-18 – 2020-04-21 (×5): 50 mg via ORAL
  Filled 2020-04-17 (×6): qty 1
  Filled 2020-04-17: qty 7

## 2020-04-17 MED ORDER — LORAZEPAM 1 MG PO TABS
1.0000 mg | ORAL_TABLET | Freq: Four times a day (QID) | ORAL | Status: AC | PRN
  Administered 2020-04-18 – 2020-04-20 (×5): 1 mg via ORAL
  Filled 2020-04-17 (×5): qty 1

## 2020-04-17 MED ORDER — THIAMINE HCL 100 MG PO TABS
100.0000 mg | ORAL_TABLET | Freq: Every day | ORAL | Status: DC
  Administered 2020-04-17: 100 mg via ORAL
  Filled 2020-04-17: qty 1

## 2020-04-17 MED ORDER — ALUM & MAG HYDROXIDE-SIMETH 200-200-20 MG/5ML PO SUSP: 30.0000 mL | ORAL | Status: DC | PRN

## 2020-04-17 MED ORDER — LORAZEPAM 1 MG PO TABS
0.0000 mg | ORAL_TABLET | Freq: Four times a day (QID) | ORAL | Status: DC
  Administered 2020-04-17: 1 mg via ORAL
  Filled 2020-04-17: qty 1

## 2020-04-17 MED ORDER — LORAZEPAM 1 MG PO TABS: 0.0000 mg | ORAL_TABLET | Freq: Two times a day (BID) | ORAL | Status: DC

## 2020-04-17 MED ORDER — LORAZEPAM 2 MG/ML IJ SOLN: 0.0000 mg | Freq: Two times a day (BID) | INTRAMUSCULAR | Status: DC

## 2020-04-17 NOTE — ED Notes (Signed)
PT CURRENTLY IN THE SHOWER!

## 2020-04-17 NOTE — Tx Team (Signed)
Initial Treatment Plan 04/17/2020 11:54 PM Larry Adams [id number]    PATIENT STRESSORS: Substance abuse Other: homelessness   PATIENT STRENGTHS: Motivation for treatment/growth Supportive family/friends   PATIENT IDENTIFIED PROBLEMS: Alcohol abuse  Suicidal ideation   Anxiety   Homelessness   (Getting into [facility] and getting back on medication)             DISCHARGE CRITERIA:  Adequate post-discharge living arrangements Improved stabilization in mood, thinking, and/or behavior Verbal commitment to aftercare and medication compliance Withdrawal symptoms are absent or subacute and managed without 24-hour nursing intervention  PRELIMINARY DISCHARGE PLAN: Attend aftercare/continuing care group Attend PHP/IOP Outpatient therapy Placement in alternative living arrangements  PATIENT/FAMILY INVOLVEMENT: This treatment plan has been presented to and reviewed with the patient, Larry Adams, and/or family member.  The patient and family have been given the opportunity to ask questions and make suggestions.  Mancel Bale, RN 04/17/2020, 11:54 PM

## 2020-04-17 NOTE — ED Notes (Signed)
Safe transport here for transporting pt to [facility]

## 2020-04-17 NOTE — ED Notes (Signed)
GAVE PT SANDWICH AND DRINK. WAITING ON TRAY

## 2020-04-17 NOTE — ED Provider Notes (Signed)
[facility]-EMERGENCY DEPT Provider Note   CSN: [id number] Arrival date & time: 04/17/20  1601     History Chief Complaint  Patient presents with  . Suicidal    Larry Adams is a 34 y.o. male.  HPI   Patient presents concern of suicidal ideation and polysubstance abuse.  Patient denies physical pain, complaints. He notes a history of depression, ongoing abuse of fentanyl, IV and alcohol.  He also has a history of anxiety. He has previously been on SSRI, benzodiazepine, but has no medication currently. He notes that he relapsed into substance abuse a few months ago, since that time has been substantially, using fentanyl daily.  Today, he was on the edge of running into traffic, thoughts of killing himself.  Past Medical History:  Diagnosis Date  . Anxiety   . Back pain   . Depression   . Drug overdose   . Mental disorder     Patient Active Problem List   Diagnosis Date Noted  . Acute respiratory failure with hypoxia (HCC) 01/11/2020  . Polysubstance abuse (HCC) 01/11/2020  . Aspiration pneumonia (HCC)   . Severe recurrent major depression without psychotic features (HCC) 11/06/2018  . Alcohol abuse with alcohol-induced mood disorder (HCC) 09/12/2017  . MDD (major depressive disorder), recurrent severe, without psychosis (HCC) 09/12/2017  . Benzodiazepine dependence (HCC) 07/23/2014  . Alcohol abuse 07/23/2014  . Substance induced mood disorder (HCC) 07/23/2014  . 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 HERNIA REPAIR         Family History  Family history unknown: Yes    Social History   Tobacco Use  . Smoking status: Current Every Day Smoker    Packs/day: 1.00    Types: Cigarettes  . Smokeless tobacco: Former Clinical biochemist  . Vaping Use: Former  Substance Use Topics  . Alcohol use: Yes    Alcohol/week: 6.0 standard drinks    Types: 6 Cans of beer per week      Comment: PTA 25 oz of beer   . Drug use: Yes    Types: Benzodiazepines, IV, Methamphetamines    Comment: Overdose on Heroin     Home Medications Prior to Admission medications   Medication Sig Start Date End Date Taking? Authorizing Provider  gabapentin (NEURONTIN) 300 MG capsule Take 1 capsule (300 mg total) by mouth 3 (three) times daily. Patient not taking: Reported on 01/11/2020 11/09/18   [name], MD  mirtazapine (REMERON) 15 MG tablet Take 1 tablet (15 mg total) by mouth at bedtime. Patient not taking: Reported on 11/15/2018 11/09/18   [name], MD  venlafaxine XR (EFFEXOR-XR) 75 MG 24 hr capsule Take 1 capsule (75 mg total) by mouth daily with breakfast. Patient not taking: Reported on 11/15/2018 11/09/18   [name], MD    Allergies    Morphine and related  Review of Systems   Review of Systems  Constitutional:       Per HPI, otherwise negative  HENT:       Per HPI, otherwise negative  Respiratory:       Per HPI, otherwise negative  Cardiovascular:       Per HPI, otherwise negative  Gastrointestinal: Negative for vomiting.  Endocrine:       Negative aside from HPI  Genitourinary:       Neg aside from HPI   Musculoskeletal:       Per HPI, otherwise negative  Skin: Negative.   Neurological: Negative for syncope.  Psychiatric/Behavioral: Positive for dysphoric mood and suicidal ideas. The patient is nervous/anxious.     Physical Exam Updated Vital Signs BP (!) 142/88 (BP Location: Left Arm)   Pulse 98   Temp 98.9 F (37.2 C) (Oral)   Resp 18   Ht 6\' 1"  (1.854 m)   Wt 117.9 kg   SpO2 97%   BMI 34.30 kg/m   Physical Exam Vitals and nursing note reviewed.  Constitutional:      General: He is not in acute distress.    Appearance: He is well-developed.  HENT:     Head: Normocephalic and atraumatic.  Eyes:     Conjunctiva/sclera: Conjunctivae normal.  Cardiovascular:     Rate and Rhythm: Normal rate and regular rhythm.  Pulmonary:      Effort: Pulmonary effort is normal. No respiratory distress.     Breath sounds: No stridor.  Abdominal:     General: There is no distension.  Skin:    General: Skin is warm and dry.       Neurological:     Mental Status: He is alert and oriented to person, place, and time.  Psychiatric:        Thought Content: Thought content includes suicidal ideation.        Cognition and Memory: Cognition is not impaired. Memory is not impaired.     ED Results / Procedures / Treatments   Labs (all labs ordered are listed, but only abnormal results are displayed) Labs Reviewed  URINALYSIS, ROUTINE W REFLEX MICROSCOPIC - Abnormal; Notable for the following components:      Result Value   Color, Urine STRAW (*)    Specific Gravity, Urine 1.002 (*)    All other components within normal limits  ETHANOL - Abnormal; Notable for the following components:   Alcohol, Ethyl (B) 205 (*)    All other components within normal limits  ACETAMINOPHEN LEVEL - Abnormal; Notable for the following components:   Acetaminophen (Tylenol), Serum <10 (*)    All other components within normal limits  SALICYLATE LEVEL - Abnormal; Notable for the following components:   Salicylate Lvl <7.0 (*)    All other components within normal limits  COMPREHENSIVE METABOLIC PANEL - Abnormal; Notable for the following components:   Potassium 3.3 (*)    BUN 5 (*)    Albumin 3.4 (*)    AST 52 (*)    Total Bilirubin 1.5 (*)    All other components within normal limits  RESPIRATORY PANEL BY RT PCR (FLU A&B, COVID)  RAPID URINE DRUG SCREEN, HOSP PERFORMED    EKG EKG Interpretation  Date/Time:  Wednesday April 17 2020 16:54:41 EDT Ventricular Rate:  95 PR Interval:  118 QRS Duration: 90 QT Interval:  380 QTC Calculation: 477 R Axis:   54 Text Interpretation: Normal sinus rhythm Baseline wander Otherwise within normal limits Confirmed by [id number] [phone]) on 04/17/2020 5:09:26 PM    Procedures Procedures  (including critical care time)  Medications Ordered in ED Medications  LORazepam (ATIVAN) injection 0-4 mg (has no administration in time range)    Or  LORazepam (ATIVAN) tablet 0-4 mg (has no administration in time range)  LORazepam (ATIVAN) injection 0-4 mg (has no administration in time range)    Or  LORazepam (ATIVAN) tablet 0-4 mg (has no administration in time range)  thiamine tablet 100 mg (has no administration in time range)    Or  thiamine (B-1) injection 100  mg (has no administration in time range)    ED Course  I have reviewed the triage vital signs and the nursing notes.  Pertinent labs & imaging results that were available during my care of the patient were reviewed by me and considered in my medical decision making (see chart for details).  Disheveled obese young adult male awake, alert, speaking clearly, with acknowledged history of polysubstance abuse now presents with new suicidal ideation. Patient has no early evidence for complicated withdrawal, has no physical complaints, but with his history of polysubstance abuse, depression, anxiety, patient will require behavioral evaluation.  Initial findings reassuring, no evidence, as above, for ongoing withdrawal (etoh>200 on arrival), nor for electrolyte abnormalities, hemodynamic instability, infection from his IV drug use. Final Clinical Impression(s) / ED Diagnoses Final diagnoses:  Suicidal ideation  Polysubstance abuse (HCC)     Gerhard Munch, MD 04/17/20 [phone]

## 2020-04-17 NOTE — ED Triage Notes (Signed)
Voluntary walk in. States that he has thoughts to kill himself by jumping out in front of a car. He is tired of being homeless, on the streets and would like to be admitted to [facility].  He was at sober living in May.  Pt shoots Fentanyl and drinks alcohol.  He prefers heroin but said that Fentanyl is what you get now on the streets. Last use was Fentanyl yesterday and drank 6 to 8 beers throughout the day today.   Pt presents as calm and cooperative. Very disheveled and dirty appearing. Belongings were dirty.  BOOK BAG AND 2 HOSPITAL BAGS PLACED IN LOCKER 30. BLANKET WITH SOME OTHER STUFF WRAPPED IN IT IS IN THE [facility] DAY ROOM.

## 2020-04-17 NOTE — ED Notes (Signed)
Pt allowed to take a shower.
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
## 2020-04-17 NOTE — ED Notes (Signed)
DINNER TRAY GIVEN °

## 2020-04-17 NOTE — ED Notes (Signed)
Safe transport notified. 

## 2020-04-17 NOTE — ED Notes (Signed)
Pt wanded by security and escorted back to TCU for triage. Pt will be changed and belongings taken in TCU.

## 2020-04-18 LAB — TSH: TSH: 3.859 u[IU]/mL (ref 0.350–4.500)

## 2020-04-18 LAB — LIPID PANEL
Cholesterol: 194 mg/dL (ref 0–200)
HDL: 73 mg/dL (ref >40)
LDL Cholesterol: 113 mg/dL — ABNORMAL HIGH (ref 0–99)
Total CHOL/HDL Ratio: 2.7 RATIO
Triglycerides: 40 mg/dL (ref <150)
VLDL: 8 mg/dL (ref 0–40)

## 2020-04-18 MED ORDER — NICOTINE 21 MG/24HR TD PT24
21.0000 mg | MEDICATED_PATCH | Freq: Every day | TRANSDERMAL | Status: DC
  Filled 2020-04-18 (×7): qty 1

## 2020-04-18 MED ORDER — POTASSIUM CHLORIDE CRYS ER 10 MEQ PO TBCR
10.0000 meq | EXTENDED_RELEASE_TABLET | Freq: Two times a day (BID) | ORAL | Status: AC
  Administered 2020-04-18: 10 meq via ORAL
  Filled 2020-04-18: qty 1

## 2020-04-18 NOTE — BHH Suicide Risk Assessment (Signed)
[facility] Admission Suicide Risk Assessment   Nursing information obtained from:  Patient Demographic factors:  Male, Caucasian, Low socioeconomic status, Unemployed, Living alone Current Mental Status:  NA Loss Factors:  Financial problems / change in socioeconomic status Historical Factors:  NA Risk Reduction Factors:  Positive social support  Total Time spent with patient: 45 minutes Principal Problem: Severe recurrent major depression without psychotic features (HCC) Diagnosis:  Principal Problem:   Severe recurrent major depression without psychotic features (HCC) Active Problems:   Moderate alcohol use disorder (HCC)  Subjective Data:   34 yo WM with h/o depression, anxiety, substance abuse who presented voluntarily for SI on 10/20. Etoh 205, UDS+BZD.   Per [facility] BH Assessment -Clinician reviewed note by Dr. Jeraldine Loots.Patient presents concern of suicidal ideation and polysubstance abuse. Patient denies physical pain, complaints. He notes a history of depression, ongoing abuse of fentanyl, IV and alcohol. He also has a history of anxiety. He has previously been on SSRI, benzodiazepine, but has no medication currently. He notes that he relapsed into substance abuse a few months ago, since that time has been substantially, using fentanyl daily. Today, he was on the edge of running into traffic, thoughts of killing himself.  Patient says that he is homeless. Today he said that a car almost hit him and he thought about how easy it would be to step into traffic to kill himself. He says that he still feels this way now. Patient denies previous attempts.   Patient denies any HI or A/V hallucinations. Patient does say that about 3 months ago he relapsed on ETOH. He is drinking twelve 16 oz malt liquors daily. Patient last drank prior to arrival. His BAL was 205 at 17:15. Patient also uses Fentayl IV. He says he used some today but had not used any in the four days prior. UDS is  negative for opiates.  Patient says he will do well with sobriety for a long time then "I get too comfortable and I think I can handle it." Referring to going back to drugs when he gets money. Patient is interested in going to a [employer] or an [place] house if he is given the chance.  Pt has good eye contact and is oriented x3. Patient is not responding to internal stimuli. He is not engaged in delusional thought process. Patient thought process is logical and coherent. He says that his appetite is good. His sleep however is interrupted so he will drink before sleeping.   Patient was at [facility] in August 2021 and said he had a bad experience. He said they gave him subutex which gave him bad hallucinations. He does not want that again. Last time at [facility] was 10/2018. Patient has no outpatient care.  -Clinician discussed patient care with Otila Back, PA who recommends inpatient psychiatric care. Clinician ran the patient by [facility] who said patient can come to [facility] [phone] to Dr. Jola Babinski.   Inpatient interview Chart reviewed. Multiple admissions to [facility], most recently 10/2018 in the setting of heroin, was [facility] with gabapentin 300 TID, remeron 15, venlafaxine 75 mg. Etoh 205 on presentation, UDS neg. On 10/20.  K low  Patient provides story as above, states that after last admission as above he went to [facility] and then subsequently went to recovery home in [facility]. He had been there for ~1 year and left in May of this year. States that he "knew it [relapse] was coming" and left. He returned to  [place] as this is where he is originally from and where his support system is which consists of several close friends. Since He has returned he describes his life having a "constant downward spiral", has been homeless,  and has been drinking daily. He reports drinking 8-12 bottles (12-16 oz)  of malt liquor daily, last drink yesterday early afternoon before coming  to the hospital. Denies h/o withdrawal seizures but reports h/o tremors. He states that yesterday he got so "Fed up" with everything and had a plan to kill himself, thinking about running into traffic. His longest period of sobriety is ~15 months in 2017.  He reports his last IV fentanyl use was about a week ago but had also been using daily prior to that after relapse. [facility] references [facility] as places he would be interested in.   He states that it has been a long time since he has seen a psychiatrist outpatient but will see them when he is admitted. Reports being on lots of medications but describes discontinuing medications after leaving the hospital.   Continued Clinical Symptoms:  Alcohol Use Disorder Identification Test Final Score (AUDIT): 14 The "Alcohol Use Disorders Identification Test", Guidelines for Use in Primary Care, Second Edition.  World Science writer [facility]). Score between 0-7:  no or low risk or alcohol related problems. Score between 8-15:  moderate risk of alcohol related problems. Score between 16-19:  high risk of alcohol related problems. Score 20 or above:  warrants further diagnostic evaluation for alcohol dependence and treatment.   CLINICAL FACTORS:   Depression:   Anhedonia Comorbid alcohol abuse/dependence Insomnia Alcohol/Substance Abuse/Dependencies Previous Psychiatric Diagnoses and Treatments  See H&P for exam and ROS  COGNITIVE FEATURES THAT CONTRIBUTE TO RISK:  Thought constriction (tunnel vision)    SUICIDE RISK:   Mild:  Suicidal ideation of limited frequency, intensity, duration, and specificity.  There are no identifiable plans, no associated intent, mild dysphoria and related symptoms, good self-control (both objective and subjective assessment), few other risk factors, and identifiable protective factors, including available and accessible social support.  PLAN OF CARE:  See H&P for full plan of care  34 yo WM with h/o  depression, AUD, anxiety who presented to the ER with SI and a plan to walk into traffic in the context of relapse and homelessness. Pt denying active SI this AM, objectively appears to be in withdrawal; PRN ativan ordered. No h/o withdrawal seizures. Discussed medications for mood with patient, defers until out of withdrawal. Continue PRN ativan for withdrawal protocol.   I certify that inpatient services furnished can reasonably be expected to improve the patient's condition.   Estella Husk, MD 04/18/2020, 12:08 PM

## 2020-04-18 NOTE — Progress Notes (Signed)
Patient presents with guarded affect. Patient is minimal with responses. Patient assessed for CIWA this morning. Patient complained of anxiety 6/10(scale 0-10 used). Mildly agitated and experiencing body aches. Patient states they "hurt all over" and they state they are very tired. CIWA=6.

## 2020-04-18 NOTE — H&P (Addendum)
Psychiatric Admission Assessment Adult  Patient Identification: Genesis Paget MRN:  [id number] Date of Evaluation:  04/18/2020 Chief Complaint:  Severe recurrent major depression without psychotic features (HCC) [F33.2] Principal Diagnosis: Severe recurrent major depression without psychotic features (HCC) Diagnosis:  Principal Problem:   Severe recurrent major depression without psychotic features (HCC) Active Problems:   Moderate alcohol use disorder (HCC)  History of Present Illness:  34 yo WM with h/o depression, anxiety, substance abuse who presented voluntarily for SI on 10/20. Etoh 205, UDS+BZD.   Per [facility]AS BH Assessment -Clinician reviewed note by Dr. Jeraldine Loots.  Patient presents concern of suicidal ideation and polysubstance abuse. Patient denies physical pain, complaints.  He notes a history of depression, ongoing abuse of fentanyl, IV and alcohol. He also has a history of anxiety.  He has previously been on SSRI, benzodiazepine, but has no medication currently. He notes that he relapsed into substance abuse a few months ago, since that time has been substantially, using fentanyl daily. Today, he was on the edge of running into traffic, thoughts of killing himself.  Patient says that he is homeless.  Today he said that a car almost hit him and he thought about how easy it would be to step into traffic to kill himself.  He says that he still feels this way now.  Patient denies previous attempts.    Patient denies any HI or A/V hallucinations.  Patient does say that about 3 months ago he relapsed on ETOH.  He is drinking twelve 16 oz malt liquors daily.  Patient last drank prior to arrival.  His BAL was 205 at 17:15.  Patient also uses Fentayl IV.  He says he used some today but had not used any in the four days prior.  UDS is negative for opiates.  Patient says he will do well with sobriety for a long time then "I get too comfortable and I think I can handle it."  Referring to  going back to drugs when he gets money.  Patient is interested in going to a [employer] or an [place] house if he is given the chance.  Pt has good eye contact and is oriented x3.  Patient is not responding to internal stimuli.  He is not engaged in delusional thought process.  Patient thought process is logical and coherent.  He says that his appetite is good.  His sleep however is interrupted so he will drink before sleeping.    Patient was at [facility] in August 2021 and said he had a bad experience.  He said they gave him subutex which gave him bad hallucinations.  He does not want that again.  Last time at [facility] was 10/2018.  Patient has no outpatient care.  -Clinician discussed patient care with Otila Back, PA who recommends inpatient psychiatric care.  Clinician ran the patient by [facility] who said patient can come to [facility] [phone] to Dr. Jola Babinski.    Inpatient interview Chart reviewed. Multiple admissions to [facility], most recently 10/2018 in the setting of heroin, [facility] with gabapentin 300 TID, remeron 15, venlafaxine 75 mg. Etoh 205 on presentation, UDS neg. On 10/20.  K low  Patient provides story as above, states that after last admission as above he went to [facility] and then subsequently went to recovery home in [facility]. He had been there for ~1 year and left in May of this year. States that he "knew it [relapse] was coming" and left. He returned  to [place] as this is where he is originally from and where his support system is which consists of several close friends. Since He has returned he describes his life having a "constant downward spiral", has been homeless,  and has been drinking daily. He reports drinking 8-12 bottles (12-16 oz)  of malt liquor daily, last drink yesterday early afternoon before coming to the hospital. Denies h/o withdrawal seizures but reports h/o tremors. He states that yesterday he got so "Fed up" with everything and had a plan to kill  himself, thinking about running into traffic. His longest period of sobriety is ~15 months in 2017.  He reports his last IV fentanyl use was about a week ago but had also been using daily prior to that after relapse. Patient references [facility] as places he would be interested in.   He states that it has been a long time since he has seen a psychiatrist outpatient but will see them when he is admitted. Reports being on lots of medications but describes discontinuing medications after leaving the hospital.     Associated Signs/Symptoms: Depression Symptoms:  depressed mood, anhedonia, insomnia, fatigue, impaired memory, anxiety, increased appetite, Duration of Depression Symptoms: No data recorded (Hypo) Manic Symptoms:  denied Anxiety Symptoms:  Excessive Worry, Psychotic Symptoms:  denies AVH/paranoia/IOR/TI/TW/TB Duration of Psychotic Symptoms: No data recorded PTSD Symptoms: Had a traumatic exposure:  yes declined to discuss intrusive thoughts Total Time spent with patient: 45 minutes  Past Psychiatric History:  Hospitalizations in the past. Unsure of dx. Usually does not continue medications on discharge  Is the patient at risk to self? Yes.    Has the patient been a risk to self in the past 6 months? No.  Has the patient been a risk to self within the distant past? Yes.    Is the patient a risk to others? No.  Has the patient been a risk to others in the past 6 months? No.  Has the patient been a risk to others within the distant past? No.   Prior Inpatient Therapy:  yes- yes most recently here ~1 yr ago Prior Outpatient Therapy:  yes - "a long time ago"  Alcohol Screening: 1. How often do you have a drink containing alcohol?: 4 or more times a week 2. How many drinks containing alcohol do you have on a typical day when you are drinking?: 5 or 6 3. How often do you have six or more drinks on one occasion?: Daily or almost daily AUDIT-C Score: 10 4. How often  during the last year have you found that you were not able to stop drinking once you had started?: Never 5. How often during the last year have you failed to do what was normally expected from you because of drinking?: Never 6. How often during the last year have you needed a first drink in the morning to get yourself going after a heavy drinking session?: Never 7. How often during the last year have you had a feeling of guilt of remorse after drinking?: Never 8. How often during the last year have you been unable to remember what happened the night before because you had been drinking?: Never 9. Have you or someone else been injured as a result of your drinking?: No 10. Has a relative or friend or a doctor or another health worker been concerned about your drinking or suggested you cut down?: Yes, during the last year Alcohol Use Disorder Identification Test Final Score (  AUDIT): 14 Alcohol Brief Interventions/Follow-up: Alcohol Education, Medication Offered/Prescribed, Continued Monitoring, Brief Advice Substance Abuse History in the last 12 months:  Yes.   Consequences of Substance Abuse: Medical Consequences:  yes Legal Consequences:  yes Family Consequences:  yes Withdrawal Symptoms:   Diaphoresis Nausea Tremors Previous Psychotropic Medications: Yes   Psychological Evaluations: No  Past Medical History:  Past Medical History:  Diagnosis Date  . Anxiety   . Back pain   . Depression   . Drug overdose   . Mental disorder     Past Surgical History:  Procedure Laterality Date  . HERNIA REPAIR     Family History:  Family History  Family history unknown: Yes   Family Psychiatric  History: "they all got soemthing" Tobacco Screening: Have you used any form of tobacco in the last 30 days? (Cigarettes, Smokeless Tobacco, Cigars, and/or Pipes): Yes Tobacco use, Select all that apply: 5 or more cigarettes per day Are you interested in Tobacco Cessation Medications?: Yes, will notify MD  for an order Counseled patient on smoking cessation including recognizing danger situations, developing coping skills and basic information about quitting provided: Refused/Declined practical counseling Social History:  Social History   Substance and Sexual Activity  Alcohol Use Yes  . Alcohol/week: 6.0 standard drinks  . Types: 6 Cans of beer per week   Comment: PTA 25 oz of beer; drinking all day everyday for 2 months     Social History   Substance and Sexual Activity  Drug Use Yes  . Types: IV, Fentanyl   Comment: Overdose on Heroin; IV Fentanyl use    Additional Social History:         few good friends- yes From [place] Graduated 12 th grade Not working. Previously worked in [employer]                  Allergies:   Allergies  Allergen Reactions  . Morphine And Related Hives   Lab Results:  Results for orders placed or performed during the hospital encounter of 04/17/20 (from the past 48 hour(s))  Lipid panel     Status: Abnormal   Collection Time: 04/18/20  6:20 AM  Result Value Ref Range   Cholesterol 194 0 - 200 mg/dL   Triglycerides 40 <161 mg/dL   HDL 73 >09 mg/dL   Total CHOL/HDL Ratio 2.7 RATIO   VLDL 8 0 - 40 mg/dL   LDL Cholesterol 604 (H) 0 - 99 mg/dL    Comment:        Total Cholesterol/HDL:CHD Risk Coronary Heart Disease Risk Table                     Men   Women  1/2 Average Risk   3.4   3.3  Average Risk       5.0   4.4  2 X Average Risk   9.6   7.1  3 X Average Risk  23.4   11.0        Use the calculated Patient Ratio above and the CHD Risk Table to determine the patient's CHD Risk.        ATP III CLASSIFICATION (LDL):  <100     mg/dL   Optimal  540-981  mg/dL   Near or Above                    Optimal  130-159  mg/dL   Borderline  191-478  mg/dL   High  >295  mg/dL   Very High Performed at [facility], [address] [place], [place] [address]   TSH     Status: None   Collection Time: 04/18/20   6:20 AM  Result Value Ref Range   TSH 3.859 0.350 - 4.500 uIU/mL    Comment: Performed by a 3rd Generation assay with a functional sensitivity of <=0.01 uIU/mL. Performed at [facility], [address], [place], [place] [address]     Blood Alcohol level:  Lab Results  Component Value Date   ETH 205 (H) 04/17/2020   ETH 100 (H) 11/05/2018    Metabolic Disorder Labs:  No results found for: HGBA1C, MPG No results found for: PROLACTIN Lab Results  Component Value Date   CHOL 194 04/18/2020   TRIG 40 04/18/2020   HDL 73 04/18/2020   CHOLHDL 2.7 04/18/2020   VLDL 8 04/18/2020   LDLCALC 113 (H) 04/18/2020    Current Medications: Current Facility-Administered Medications  Medication Dose Route Frequency Provider Last Rate Last Admin  . acetaminophen (TYLENOL) tablet 650 mg  650 mg Oral Q6H PRN Nira Conn A, NP   650 mg at 04/18/20 1148  . alum & mag hydroxide-simeth (MAALOX/MYLANTA) 200-200-20 MG/5ML suspension 30 mL  30 mL Oral Q4H PRN Nira Conn A, NP      . hydrOXYzine (ATARAX/VISTARIL) tablet 25 mg  25 mg Oral TID PRN Nira Conn A, NP   25 mg at 04/18/20 1148  . loperamide (IMODIUM) capsule 2-4 mg  2-4 mg Oral PRN Nira Conn A, NP      . LORazepam (ATIVAN) tablet 1 mg  1 mg Oral Q6H PRN Nira Conn A, NP   1 mg at 04/18/20 1149  . magnesium hydroxide (MILK OF MAGNESIA) suspension 30 mL  30 mL Oral Daily PRN Nira Conn A, NP      . nicotine (NICODERM CQ - dosed in mg/24 hours) patch 21 mg  21 mg Transdermal Daily Nira Conn A, NP      . ondansetron (ZOFRAN-ODT) disintegrating tablet 4 mg  4 mg Oral Q6H PRN Nira Conn A, NP   4 mg at 04/18/20 1149  . traZODone (DESYREL) tablet 50 mg  50 mg Oral QHS PRN Jackelyn Poling, NP   50 mg at 04/18/20 0009   PTA Medications: Medications Prior to Admission  Medication Sig Dispense Refill Last Dose  . gabapentin (NEURONTIN) 300 MG capsule Take 1 capsule (300 mg total) by mouth 3 (three) times daily.  (Patient not taking: Reported on 01/11/2020) 90 capsule 1   . mirtazapine (REMERON) 15 MG tablet Take 1 tablet (15 mg total) by mouth at bedtime. (Patient not taking: Reported on 11/15/2018) 30 tablet 1   . Multiple Vitamins-Minerals (AIRBORNE GUMMIES PO) Take 3 tablets by mouth daily. Gummies     . venlafaxine XR (EFFEXOR-XR) 75 MG 24 hr capsule Take 1 capsule (75 mg total) by mouth daily with breakfast. (Patient not taking: Reported on 11/15/2018) 30 capsule 1     Musculoskeletal: Strength & Muscle Tone: within normal limits Gait & Station: normal Patient leans: N/A  Psychiatric Specialty Exam: Physical Exam Constitutional:      Appearance: Normal appearance.  HENT:     Head: Normocephalic and atraumatic.  Eyes:     Extraocular Movements: Extraocular movements intact.  Pulmonary:     Effort: Pulmonary effort is normal.  Musculoskeletal:     Cervical back: Normal range of motion.  Neurological:     General: No  focal deficit present.     Mental Status: He is alert.     Comments: Mild tremors of BUE with outstretched arms     Review of Systems  Constitutional: Positive for diaphoresis. Negative for fever.  HENT: Positive for congestion.   Respiratory: Positive for cough. Negative for chest tightness and shortness of breath.   Cardiovascular: Negative for chest pain.  Musculoskeletal: Negative for back pain.  Neurological: Negative for dizziness.    Blood pressure 113/80, pulse 99, temperature 98.3 F (36.8 C), temperature source Oral, height  (1.854 m), weight 123.8 kg, SpO2 96 %.Body mass index is 36.02 kg/m.  General Appearance: Disheveled and dressed in scrubs, irritable appearing  Eye Contact:  Fair  Speech:  Clear and Coherent and Normal Rate, irritable tone of voice  Volume:  Normal  Mood:  "tired"  Affect:  Constricted and dysthymic  Thought Process:  Goal Directed and Linear  Orientation:  Full (Time, Place, and Person)  Thought Content:  Logical  Suicidal  Thoughts:  denied  Homicidal Thoughts:  denied  Memory:  Immediate;   Fair Recent;   Fair Remote;   Fair  Judgement:  Fair  Insight:  Fair  Psychomotor Activity:  Normal  Concentration:  Concentration: Fair  Recall:  Fair  Fund of Knowledge:  Good  Language:  Good  Akathisia:  No  Handed:  Right  AIMS (if indicated):     Assets:  Communication Skills Desire for Improvement Resilience  ADL's:  Intact  Cognition:  WNL  Sleep:  Number of Hours: 5.25     Daily contact with patient to assess and evaluate symptoms and progress in treatment and Medication management  Observation Level/Precautions:  Detox  Laboratory:  CBC (K is low, will redraw after supplementation)  Psychotherapy:    Medications:  Ativan PRN CIWA>10. Pt would like to hold off on scheduled psychiatric medications until out of withdrawal. See MAR for other PRNs  Consultations:    Discharge Concerns:  safety  Estimated LOS: 3-5 days  Other:     Physician Treatment Plan for Primary Diagnosis: Severe recurrent major depression without psychotic features (HCC) Long Term Goal(s): Improvement in symptoms so as ready for discharge  Short Term Goals: Ability to identify changes in lifestyle to reduce recurrence of condition will improve, Ability to verbalize feelings will improve, Ability to disclose and discuss suicidal ideas, Ability to demonstrate self-control will improve and Ability to identify and develop effective coping behaviors will improve  Physician Treatment Plan for Secondary Diagnosis: Principal Problem:   Severe recurrent major depression without psychotic features (HCC) Active Problems:   Moderate alcohol use disorder (HCC)  Long Term Goal(s): Improvement in symptoms so as ready for discharge  Short Term Goals: Ability to identify changes in lifestyle to reduce recurrence of condition will improve, Ability to verbalize feelings will improve, Ability to disclose and discuss suicidal ideas, Ability to  demonstrate self-control will improve and Ability to identify and develop effective coping behaviors will improve   MDD AUD OUD SIMD  -PRN ativan for CIWA>10 -patient is deferring starting medication for mood until withdrawal sx are less severe -continue  -pt expresses interest in rehab upon discharge -has not used IV fentanyl since last week and reports going through withdrawal sx already; unlikely to experience opioid withdrawal at this time  Hypokalemia -K 3.3 (10/20) -supplementation ordered -re-draw CBC in the AM   I certify that inpatient services furnished can reasonably be expected to improve the patient's condition.  Estella Husk, MD 10/21/202112:04 PM

## 2020-04-18 NOTE — BHH Group Notes (Signed)
The focus of this group is to help patients establish daily goals to achieve during treatment and discuss how the patient can incorporate goal setting into their daily lives to aide in recovery.  Pt did not attend group 

## 2020-04-18 NOTE — BHH Group Notes (Signed)
Occupational Therapy Group Note Date: 04/18/2020 Group Topic/Focus: Communication Skills  Group Description: Group encouraged increased engagement and participation through discussion focused on identifying and 'breaking down barriers." Group members were encouraged to complete a worksheet and engage in discussion identifying barriers that get in the way of seeking treatment, asking for help, and managing our mental health.   Participation Level: Patient did not attend OT group session.  Plan: Continue to engage patient in OT groups 2 - 3x/week.  04/18/2020  Donne Hazel, MOT, OTR/L

## 2020-04-18 NOTE — Progress Notes (Signed)
D: Patient presents with anxious affect. Patient expresses desire to live in a sober living facility and desire to get started back on medication. Patient denies SI/HI at time of assessment but does admit to having thoughts of harming himself recently. Patient denies AH/VH.  Patient reports daily alcohol usage and IV fentanyl usage.  A: Provided positive reinforcement and encouragement.  R: Patient cooperative and receptive to efforts.

## 2020-04-19 LAB — HEMOGLOBIN A1C
Hgb A1c MFr Bld: 5.2 % (ref 4.8–5.6)
Mean Plasma Glucose: 103 mg/dL

## 2020-04-19 LAB — COMPREHENSIVE METABOLIC PANEL
ALT: 44 U/L (ref 0–44)
AST: 83 U/L — ABNORMAL HIGH (ref 15–41)
Albumin: 3.4 g/dL — ABNORMAL LOW (ref 3.5–5.0)
Alkaline Phosphatase: 83 U/L (ref 38–126)
Anion gap: 8 (ref 5–15)
BUN: 10 mg/dL (ref 6–20)
CO2: 24 mmol/L (ref 22–32)
Calcium: 9.2 mg/dL (ref 8.9–10.3)
Chloride: 108 mmol/L (ref 98–111)
Creatinine, Ser: 0.85 mg/dL (ref 0.61–1.24)
GFR, Estimated: >60 mL/min (ref >60)
Glucose, Bld: 92 mg/dL (ref 70–99)
Potassium: 4.3 mmol/L (ref 3.5–5.1)
Sodium: 140 mmol/L (ref 135–145)
Total Bilirubin: 1.7 mg/dL — ABNORMAL HIGH (ref 0.3–1.2)
Total Protein: 6.9 g/dL (ref 6.5–8.1)

## 2020-04-19 NOTE — Progress Notes (Signed)
Pt did not attend wrap-up group   
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
## 2020-04-19 NOTE — Tx Team (Signed)
Interdisciplinary Treatment and Diagnostic Plan Update  04/19/2020 Time of Session: 9:25am Larry Adams MRN: [id number]  Principal Diagnosis: Severe recurrent major depression without psychotic features [facility])  Secondary Diagnoses: Principal Problem:   Severe recurrent major depression without psychotic features ([place]) Active Problems:   Moderate alcohol use disorder (HCC)   Current Medications:  Current Facility-Administered Medications  Medication Dose Route Frequency Provider Last Rate Last Admin   acetaminophen (TYLENOL) tablet 650 mg  650 mg Oral Q6H PRN Rozetta Nunnery, NP   650 mg at 04/19/20 0927   alum & mag hydroxide-simeth (MAALOX/MYLANTA) 200-200-20 MG/5ML suspension 30 mL  30 mL Oral Q4H PRN Rozetta Nunnery, NP       hydrOXYzine (ATARAX/VISTARIL) tablet 25 mg  25 mg Oral TID PRN Rozetta Nunnery, NP   25 mg at 04/18/20 2105   loperamide (IMODIUM) capsule 2-4 mg  2-4 mg Oral PRN Rozetta Nunnery, NP       LORazepam (ATIVAN) tablet 1 mg  1 mg Oral Q6H PRN Rozetta Nunnery, NP   1 mg at 04/19/20 4132   magnesium hydroxide (MILK OF MAGNESIA) suspension 30 mL  30 mL Oral Daily PRN Rozetta Nunnery, NP       nicotine (NICODERM CQ - dosed in mg/24 hours) patch 21 mg  21 mg Transdermal Daily Lindon Romp A, NP       ondansetron (ZOFRAN-ODT) disintegrating tablet 4 mg  4 mg Oral Q6H PRN Lindon Romp A, NP   4 mg at 04/19/20 4401   traZODone (DESYREL) tablet 50 mg  50 mg Oral QHS PRN Rozetta Nunnery, NP   50 mg at 04/18/20 2105   PTA Medications: Medications Prior to Admission  Medication Sig Dispense Refill Last Dose   gabapentin (NEURONTIN) 300 MG capsule Take 1 capsule (300 mg total) by mouth 3 (three) times daily. (Patient not taking: Reported on 01/11/2020) 90 capsule 1    mirtazapine (REMERON) 15 MG tablet Take 1 tablet (15 mg total) by mouth at bedtime. (Patient not taking: Reported on 11/15/2018) 30 tablet 1    Multiple Vitamins-Minerals (AIRBORNE GUMMIES PO) Take 3 tablets  by mouth daily. Gummies      venlafaxine XR (EFFEXOR-XR) 75 MG 24 hr capsule Take 1 capsule (75 mg total) by mouth daily with breakfast. (Patient not taking: Reported on 11/15/2018) 30 capsule 1     Patient Stressors: Substance abuse Other: homelessness  Patient Strengths: Motivation for treatment/growth Supportive family/friends  Treatment Modalities: Medication Management, Group therapy, Case management,  1 to 1 session with clinician, Psychoeducation, Recreational therapy.   Physician Treatment Plan for Primary Diagnosis: Severe recurrent major depression without psychotic features ([place]) Long Term Goal(s): Improvement in symptoms so as ready for discharge Improvement in symptoms so as ready for discharge   Short Term Goals: Ability to identify changes in lifestyle to reduce recurrence of condition will improve Ability to verbalize feelings will improve Ability to disclose and discuss suicidal ideas Ability to demonstrate self-control will improve Ability to identify and develop effective coping behaviors will improve Ability to identify changes in lifestyle to reduce recurrence of condition will improve Ability to verbalize feelings will improve Ability to disclose and discuss suicidal ideas Ability to demonstrate self-control will improve Ability to identify and develop effective coping behaviors will improve  Medication Management: Evaluate patient's response, side effects, and tolerance of medication regimen.  Therapeutic Interventions: 1 to 1 sessions, Unit Group sessions and Medication administration.  Evaluation of Outcomes: Not Met  Physician  Treatment Plan for Secondary Diagnosis: Principal Problem:   Severe recurrent major depression without psychotic features ([place]) Active Problems:   Moderate alcohol use disorder (HCC)  Long Term Goal(s): Improvement in symptoms so as ready for discharge Improvement in symptoms so as ready for discharge   Short Term Goals:  Ability to identify changes in lifestyle to reduce recurrence of condition will improve Ability to verbalize feelings will improve Ability to disclose and discuss suicidal ideas Ability to demonstrate self-control will improve Ability to identify and develop effective coping behaviors will improve Ability to identify changes in lifestyle to reduce recurrence of condition will improve Ability to verbalize feelings will improve Ability to disclose and discuss suicidal ideas Ability to demonstrate self-control will improve Ability to identify and develop effective coping behaviors will improve     Medication Management: Evaluate patient's response, side effects, and tolerance of medication regimen.  Therapeutic Interventions: 1 to 1 sessions, Unit Group sessions and Medication administration.  Evaluation of Outcomes: Not Met   RN Treatment Plan for Primary Diagnosis: Severe recurrent major depression without psychotic features ([place]) Long Term Goal(s): Knowledge of disease and therapeutic regimen to maintain health will improve  Short Term Goals: Ability to remain free from injury will improve, Ability to verbalize frustration and anger appropriately will improve, Ability to identify and develop effective coping behaviors will improve and Compliance with prescribed medications will improve  Medication Management: RN will administer medications as ordered by provider, will assess and evaluate patient's response and provide education to patient for prescribed medication. RN will report any adverse and/or side effects to prescribing provider.  Therapeutic Interventions: 1 on 1 counseling sessions, Psychoeducation, Medication administration, Evaluate responses to treatment, Monitor vital signs and CBGs as ordered, Perform/monitor CIWA, COWS, AIMS and Fall Risk screenings as ordered, Perform wound care treatments as ordered.  Evaluation of Outcomes: Not Met   LCSW Treatment Plan for Primary  Diagnosis: Severe recurrent major depression without psychotic features ([place]) Long Term Goal(s): Safe transition to appropriate next level of care at discharge, Engage patient in therapeutic group addressing interpersonal concerns.  Short Term Goals: Engage patient in aftercare planning with referrals and resources, Increase social support, Increase ability to appropriately verbalize feelings, Identify triggers associated with mental health/substance abuse issues and Increase skills for wellness and recovery  Therapeutic Interventions: Assess for all discharge needs, 1 to 1 time with Social worker, Explore available resources and support systems, Assess for adequacy in community support network, Educate family and significant other(s) on suicide prevention, Complete Psychosocial Assessment, Interpersonal group therapy.  Evaluation of Outcomes: Not Met   Progress in Treatment: Attending groups: No. Participating in groups: No. Taking medication as prescribed: Yes. Toleration medication: Yes. Family/Significant other contact made: No, will contact:  if consent is provided  Patient understands diagnosis: Yes. Discussing patient identified problems/goals with staff: Yes. Medical problems stabilized or resolved: Yes. Denies suicidal/homicidal ideation: Yes. Issues/concerns per patient self-inventory: No.   New problem(s) identified: No, Describe:  none  New Short Term/Long Term Goal(s): medication stabilization, elimination of SI thoughts, development of comprehensive mental wellness plan.   Patient Goals:  "To detox, start meds, and look into [facility] and [facility]"  Discharge Plan or Barriers: Patient recently admitted. CSW will continue to follow and assess for appropriate referrals and possible discharge planning.   Reason for Continuation of Hospitalization: Depression Medication stabilization Withdrawal symptoms  Estimated Length of Stay: 1-3 days  Attendees: Patient: Larry Adams  04/19/2020  Physician:  04/19/2020   Nursing:  04/19/2020   RN Care Manager: 04/19/2020   Social Worker: Darletta Moll, LCSW 04/19/2020   Recreational Therapist:  04/19/2020  Other: Harriett Sine, NP 04/19/2020   Other:  04/19/2020  Other: 04/19/2020       Scribe for Treatment Team: Vassie Moselle, LCSW 04/19/2020 10:18 AM

## 2020-04-19 NOTE — BHH Suicide Risk Assessment (Signed)
BHH INPATIENT:  Family/Significant Other Suicide Prevention Education  Suicide Prevention Education:  Patient Refusal for Family/Significant Other Suicide Prevention Education: The patient Larry Adams has refused to provide written consent for family/significant other to be provided Family/Significant Other Suicide Prevention Education during admission and/or prior to discharge.  Physician notified.  CSW completed SPE with Pt as he declined consent for contact with family / significant other. Pt currently lacks healthy supports in the community. SPE information was discussed with reference to information outlined in SPI Pamphlet. Pt was given the opportunity and encourage to ask questions related to SPE information. In addition, Pt encouraged to share safety planning information with a future identified support person. Pt denies access to weapons and is currently denying SI. Pt was provided with a copy of SPI Pamphlet to include Crisis Line telephone number.  Joelyn Oms Nylene Inlow, LCSW 04/19/2020, 11:24 AM

## 2020-04-19 NOTE — BHH Group Notes (Signed)
BHH LCSW Group Therapy  04/19/2020 2:06 PM  Type of Therapy:  Self-Care  Participation Level:  Did Not Attend  Summary of Progress/Problems: Pt was encouraged to attend and participate in group based on self-care as well as to be educated on the therapeutic benefits of maintaining self-care. Pt did not attend group.   Joelyn Oms [place], LCSW 04/19/2020, 2:06 PM

## 2020-04-19 NOTE — Progress Notes (Signed)
Pt continues to have withdrawal sx    04/19/20 0000  Psych Admission Type (Psych Patients Only)  Admission Status Voluntary  Psychosocial Assessment  Patient Complaints Substance abuse  Eye Contact Brief  Facial Expression Other (Comment) (pt drowsy upon assessment)  Affect Anxious;Appropriate to circumstance  Speech Logical/coherent  Interaction Assertive  Motor Activity Lethargic (patient drowsy during assessment)  Appearance/Hygiene Poor hygiene  Behavior Characteristics Cooperative  Mood Anxious;Depressed  Thought Process  Coherency WDL  Content WDL  Delusions None reported or observed  Perception WDL  Hallucination None reported or observed  Judgment Impaired  Confusion None  Danger to Self  Current suicidal ideation? Denies  Agreement Not to Harm Self Yes  Description of Agreement verbal agreement to approach staff   Danger to Others  Danger to Others None reported or observed

## 2020-04-19 NOTE — Progress Notes (Signed)
Pt reported that his depression level and anxiety level were at an 8 out of 10 with 10 being the highest amount possible.  Pt said his goal is to go to [facility] [facility] [facility] [place] post d/c.  Pt spent most of the day in his room sleeping. Pt c/o of anxiety and was medicated twice during the day.  Pt was up this afternoon sitting in the dayroom talking with other patients. Pt remains safe with q 15 min room checks.  RN will continue to monitor and provide support as needed.

## 2020-04-19 NOTE — Progress Notes (Signed)
[facility] MD Progress Note  04/19/2020 10:51 AM Larry Adams  MRN:  [id number] Subjective:  "I'm going to call some places today"  Chart reviewed. Pt received 3 mg PRN ativan in the last 24 hours, most recently at ~930 am. Pt found laying in bed in NAD, sleeping. He appears disheveled. He states that he met with treatment team earlier and is  Motivated to call [facility] or [facility]. States he is currently having withdrawal sx of tremulousness, diaphoresis, nausea. Denies AVH. Denies CP/SOB. Denies SI/HI. Pt states that he would like to defer starting medication for mood until withdrawal sx are less severe.    Principal Problem: Severe recurrent major depression without psychotic features ([place]) Diagnosis: Principal Problem:   Severe recurrent major depression without psychotic features ([place]) Active Problems:   Moderate alcohol use disorder (HCC)  Total Time spent with patient: 15 minutes  Past Psychiatric History: See H&P  Past Medical History:  Past Medical History:  Diagnosis Date  . Anxiety   . Back pain   . Depression   . Drug overdose   . Mental disorder     Past Surgical History:  Procedure Laterality Date  . HERNIA REPAIR     Family History:  Family History  Family history unknown: Yes   Family Psychiatric  History: see H&P  Social History:  Social History   Substance and Sexual Activity  Alcohol Use Yes  . Alcohol/week: 6.0 standard drinks  . Types: 6 Cans of beer per week   Comment: PTA 25 oz of beer; drinking all day everyday for 2 months     Social History   Substance and Sexual Activity  Drug Use Yes  . Types: IV, Fentanyl   Comment: Overdose on Heroin; IV Fentanyl use    Social History   Socioeconomic History  . Marital status: Single    Spouse name: Not on file  . Number of children: Not on file  . Years of education: Not on file  . Highest education level: Not on file  Occupational History  . Not on file  Tobacco Use  . Smoking status: Current  Every Day Smoker    Packs/day: 1.00    Types: Cigarettes  . Smokeless tobacco: Former Network engineer  . Vaping Use: Former  Substance and Sexual Activity  . Alcohol use: Yes    Alcohol/week: 6.0 standard drinks    Types: 6 Cans of beer per week    Comment: PTA 25 oz of beer; drinking all day everyday for 2 months  . Drug use: Yes    Types: IV, Fentanyl    Comment: Overdose on Heroin; IV Fentanyl use  . Sexual activity: Yes  Other Topics Concern  . Not on file  Social History Narrative  . Not on file   Social Determinants of Health   Financial Resource Strain:   . Difficulty of Paying Living Expenses: Not on file  Food Insecurity:   . Worried About Charity fundraiser in the Last Year: Not on file  . Ran Out of Food in the Last Year: Not on file  Transportation Needs:   . Lack of Transportation (Medical): Not on file  . Lack of Transportation (Non-Medical): Not on file  Physical Activity:   . Days of Exercise per Week: Not on file  . Minutes of Exercise per Session: Not on file  Stress:   . Feeling of Stress : Not on file  Social Connections:   . Frequency of Communication  with Friends and Family: Not on file  . Frequency of Social Gatherings with Friends and Family: Not on file  . Attends Religious Services: Not on file  . Active Member of Clubs or Organizations: Not on file  . Attends Archivist Meetings: Not on file  . Marital Status: Not on file   Additional Social History:         see H&P                Sleep: Fair  Appetite:  Fair  Current Medications: Current Facility-Administered Medications  Medication Dose Route Frequency Provider Last Rate Last Admin  . acetaminophen (TYLENOL) tablet 650 mg  650 mg Oral Q6H PRN Lindon Romp A, NP   650 mg at 04/19/20 0927  . alum & mag hydroxide-simeth (MAALOX/MYLANTA) 200-200-20 MG/5ML suspension 30 mL  30 mL Oral Q4H PRN Lindon Romp A, NP      . hydrOXYzine (ATARAX/VISTARIL) tablet 25 mg  25 mg  Oral TID PRN Rozetta Nunnery, NP   25 mg at 04/18/20 2105  . loperamide (IMODIUM) capsule 2-4 mg  2-4 mg Oral PRN Lindon Romp A, NP      . LORazepam (ATIVAN) tablet 1 mg  1 mg Oral Q6H PRN Lindon Romp A, NP   1 mg at 04/19/20 0927  . magnesium hydroxide (MILK OF MAGNESIA) suspension 30 mL  30 mL Oral Daily PRN Lindon Romp A, NP      . nicotine (NICODERM CQ - dosed in mg/24 hours) patch 21 mg  21 mg Transdermal Daily Lindon Romp A, NP      . ondansetron (ZOFRAN-ODT) disintegrating tablet 4 mg  4 mg Oral Q6H PRN Lindon Romp A, NP   4 mg at 04/19/20 0927  . traZODone (DESYREL) tablet 50 mg  50 mg Oral QHS PRN Rozetta Nunnery, NP   50 mg at 04/18/20 2105    Lab Results:  Results for orders placed or performed during the hospital encounter of 04/17/20 (from the past 48 hour(s))  Hemoglobin A1c     Status: None   Collection Time: 04/18/20  6:20 AM  Result Value Ref Range   Hgb A1c MFr Bld 5.2 4.8 - 5.6 %    Comment: (NOTE)         Prediabetes: 5.7 - 6.4         Diabetes: >6.4         Glycemic control for adults with diabetes: <7.0    Mean Plasma Glucose 103 mg/dL    Comment: (NOTE) Performed At: [facility] [place], [place] [id number] Rush Farmer MD [id number]   Lipid panel     Status: Abnormal   Collection Time: 04/18/20  6:20 AM  Result Value Ref Range   Cholesterol 194 0 - 200 mg/dL   Triglycerides 40 <150 mg/dL   HDL 73 >40 mg/dL   Total CHOL/HDL Ratio 2.7 RATIO   VLDL 8 0 - 40 mg/dL   LDL Cholesterol 113 (H) 0 - 99 mg/dL    Comment:        Total Cholesterol/HDL:CHD Risk Coronary Heart Disease Risk Table                     Men   Women  1/2 Average Risk   3.4   3.3  Average Risk       5.0   4.4  2 X Average Risk   9.6   7.1  3 X  Average Risk  23.4   11.0        Use the calculated Patient Ratio above and the CHD Risk Table to determine the patient's CHD Risk.        ATP III CLASSIFICATION (LDL):  <100     mg/dL   Optimal  100-129  mg/dL    Near or Above                    Optimal  130-159  mg/dL   Borderline  160-189  mg/dL   High  >190     mg/dL   Very High Performed at [facility] [address], [place], [place] [address]   TSH     Status: None   Collection Time: 04/18/20  6:20 AM  Result Value Ref Range   TSH 3.859 0.350 - 4.500 uIU/mL    Comment: Performed by a 3rd Generation assay with a functional sensitivity of <=0.01 uIU/mL. Performed at [facility], [place] [address], [place], [place] [address]   Comprehensive metabolic panel     Status: Abnormal   Collection Time: 04/19/20  6:41 AM  Result Value Ref Range   Sodium 140 135 - 145 mmol/L   Potassium 4.3 3.5 - 5.1 mmol/L   Chloride 108 98 - 111 mmol/L   CO2 24 22 - 32 mmol/L   Glucose, Bld 92 70 - 99 mg/dL    Comment: Glucose reference range applies only to samples taken after fasting for at least 8 hours.   BUN 10 6 - 20 mg/dL   Creatinine, Ser 0.85 0.61 - 1.24 mg/dL   Calcium 9.2 8.9 - 10.3 mg/dL   Total Protein 6.9 6.5 - 8.1 g/dL   Albumin 3.4 (L) 3.5 - 5.0 g/dL   AST 83 (H) 15 - 41 U/L   ALT 44 0 - 44 U/L   Alkaline Phosphatase 83 38 - 126 U/L   Total Bilirubin 1.7 (H) 0.3 - 1.2 mg/dL   GFR, Estimated >60 >60 mL/min    Comment: (NOTE) Calculated using the CKD-EPI Creatinine Equation (2021)    Anion gap 8 5 - 15    Comment: Performed at [facility], [place] [address] [place],  [address]    Blood Alcohol level:  Lab Results  Component Value Date   ETH 205 (H) 04/17/2020   ETH 100 (H) 25/00/3704    Metabolic Disorder Labs: Lab Results  Component Value Date   HGBA1C 5.2 04/18/2020   MPG 103 04/18/2020   No results found for: PROLACTIN Lab Results  Component Value Date   CHOL 194 04/18/2020   TRIG 40 04/18/2020   HDL 73 04/18/2020   CHOLHDL 2.7 04/18/2020   VLDL 8 04/18/2020   LDLCALC 113 (H) 04/18/2020    Physical Findings: AIMS:  , ,  ,  ,    CIWA:  CIWA-Ar Total:  14 COWS:     Musculoskeletal: Strength & Muscle Tone: within normal limits Gait & Station: normal Patient leans: N/A  Psychiatric Specialty Exam: Physical Exam Constitutional:      Appearance: Normal appearance. He is obese.  HENT:     Head: Normocephalic and atraumatic.  Musculoskeletal:     Cervical back: Normal range of motion.  Neurological:     Mental Status: He is alert.     Comments: Minimal tremors with out streched arms     Review of Systems  Blood pressure 109/69, pulse (!) 102, temperature 98.3 F (  36.8 C), temperature source Oral, height _0  (1.854 m), weight 123.8 kg, SpO2 96 %.Body mass index is 36.02 kg/m.  General Appearance: Disheveled  Eye Contact:  Fair  Speech:  Clear and Coherent and Normal Rate  Volume:  Normal  Mood:  "a little better"  Affect:  Constricted and dysphoric  Thought Process:  Coherent, Goal Directed and Linear  Orientation:  Full (Time, Place, and Person)  Thought Content:  WDL and Logical  Suicidal Thoughts:  denied  Homicidal Thoughts:  denied  Memory:  Immediate;   Fair Recent;   Fair  Judgement:  Fair  Insight:  Fair  Psychomotor Activity:  Normal  Concentration:  Concentration: Fair  Recall:  [employer] of Knowledge:  Fair  Language:  Good  Akathisia:  No  Handed:  Right  AIMS (if indicated):     Assets:  Communication Skills Desire for Improvement Resilience  ADL's:  Intact  Cognition:  WNL  Sleep:  Number of Hours: 5.25     Treatment Plan Summary: Daily contact with patient to assess and evaluate symptoms and progress in treatment and Medication management   MDD AUD OUD SIMD  -PRN ativan for CIWA>10 -patient is deferring starting medication for mood until withdrawal sx are less severe -continue to offer medications for mood during hospitalization as approrpiate -pt expresses interest in rehab upon discharge -has not used IV fentanyl since last week and reports going through withdrawal sx already;  unlikely to experience opioid withdrawal at this time  Hypokalemia, resolved -K 3.3 (10/20) -supplementation ordered 20 meq -re-draw CMP 10/22 4.3   Ival Bible, MD 04/19/2020, 10:51 AM

## 2020-04-19 NOTE — BHH Counselor (Signed)
CSW spoke with Pt post PSA interview. CSW provided Pt with contact information for [employer]. In addition Pt was provided a list of shelter resources in the area. Pt was encouraged to reach out to [facility] today to get more information in an effort to work on his discharge plan.   Jacinta Shoe, LCSW

## 2020-04-20 DIAGNOSIS — F332 Major depressive disorder, recurrent severe without psychotic features: Principal | ICD-10-CM

## 2020-04-20 MED ORDER — SERTRALINE HCL 25 MG PO TABS
25.0000 mg | ORAL_TABLET | Freq: Every day | ORAL | Status: DC
  Administered 2020-04-20 – 2020-04-21 (×2): 25 mg via ORAL
  Filled 2020-04-20 (×3): qty 1

## 2020-04-20 NOTE — Progress Notes (Signed)
°   04/20/20 2327  COVID-19 Daily Checkoff  Have you had a fever (temp > 37.80C/100F)  in the past 24 hours?  No  If you have had runny nose, nasal congestion, sneezing in the past 24 hours, has it worsened? No  COVID-19 EXPOSURE  Have you traveled outside the state in the past 14 days? No  Have you been in contact with someone with a confirmed diagnosis of COVID-19 or PUI in the past 14 days without wearing appropriate PPE? No  Have you been living in the same home as a person with confirmed diagnosis of COVID-19 or a PUI (household contact)? No  Have you been diagnosed with COVID-19? No

## 2020-04-20 NOTE — Progress Notes (Signed)
°   04/20/20 0200  Psych Admission Type (Psych Patients Only)  Admission Status Voluntary  Psychosocial Assessment  Patient Complaints Substance abuse  Eye Contact Brief  Facial Expression Sad  Affect Anxious;Appropriate to circumstance  Speech Logical/coherent;Soft  Interaction Assertive  Motor Activity Other (Comment) (WNL)  Appearance/Hygiene Unremarkable  Behavior Characteristics Cooperative  Mood Pleasant  Thought Process  Coherency WDL  Content WDL  Delusions None reported or observed  Perception WDL  Hallucination None reported or observed  Judgment Impaired  Confusion None  Danger to Self  Current suicidal ideation? Denies  Self-Injurious Behavior No self-injurious ideation or behavior indicators observed or expressed   Agreement Not to Harm Self Yes  Description of Agreement verbal agreement to approach staff   Danger to Others  Danger to Others None reported or observed

## 2020-04-20 NOTE — BHH Group Notes (Signed)
BHH Group Notes: (Clinical Social Work)   04/20/2020      Type of Therapy:  Group Therapy   Participation Level:  Did Not Attend - was invited both individually by MHT and by overhead announcement, chose not to attend.   Ambrose Mantle, LCSW 04/20/2020, 12:09 PM

## 2020-04-20 NOTE — Progress Notes (Signed)
[facility] MD Progress Note  04/20/2020 11:50 AM Larry Adams  MRN:  [id number] Subjective:   Larry Adams reported " I am going to [facility] [place] maybe tomorrow?."  Evaluation: Larry Adams observed resting in bed.  Presents pleasant, disheveled and cooperative.  He denied suicidal or homicidal ideations.  Denies auditory or visual hallucinations.  Does report alcohol and fentanyl craving that are worse mid day. "Fentanyl helps me relax."  Patient to continue Ativan detox protocol.    Denies any withdrawal symptoms ie, diaphoresis, tremors headaches or nausea.  Chart reviewed for vitals and updated CMP.  Continues to endorse anxiety symptoms. Stated " I get really nervous thinking about going to a new facility."  States he will be transitioning from inpatient admission to sober living housing at discharge.  Rates his depression 4 out of 10 with 10 being the worst.  Discussed initiating Seroquel for anxiety and depression.  Patient was receptive to plan.  States he has been resting since his admission.  Denied attending daily group sessions patient was encouraged during this assessment.  Reports a good appetite.  States he is resting well throughout the night.  Support, encouragement and reassurance was provided.    Principal Problem: Severe recurrent major depression without psychotic features (HCC) Diagnosis: Principal Problem:   Severe recurrent major depression without psychotic features ([facility]) Active Problems:   Moderate alcohol use disorder (HCC)  Total Time spent with patient: 15 minutes  Past Psychiatric History: See H&P  Past Medical History:  Past Medical History:  Diagnosis Date   Anxiety    Back pain    Depression    Drug overdose    Mental disorder     Past Surgical History:  Procedure Laterality Date   HERNIA REPAIR     Family History:  Family History  Family history unknown: Yes   Family Psychiatric  History: see H&P  Social History:  Social History    Substance and Sexual Activity  Alcohol Use Yes   Alcohol/week: 6.0 standard drinks   Types: 6 Cans of beer per week   Comment: PTA 25 oz of beer; drinking all day everyday for 2 months     Social History   Substance and Sexual Activity  Drug Use Yes   Types: IV, Fentanyl   Comment: Overdose on Heroin; IV Fentanyl use    Social History   Socioeconomic History   Marital status: Single    Spouse name: Not on file   Number of children: Not on file   Years of education: Not on file   Highest education level: Not on file  Occupational History   Not on file  Tobacco Use   Smoking status: Current Every Day Smoker    Packs/day: 1.00    Types: Cigarettes   Smokeless tobacco: Former Forensic psychologist Use: Former  Substance and Sexual Activity   Alcohol use: Yes    Alcohol/week: 6.0 standard drinks    Types: 6 Cans of beer per week    Comment: PTA 25 oz of beer; drinking all day everyday for 2 months   Drug use: Yes    Types: IV, Fentanyl    Comment: Overdose on Heroin; IV Fentanyl use   Sexual activity: Yes  Other Topics Concern   Not on file  Social History Narrative   Not on file   Social Determinants of Health   Financial Resource Strain:    Difficulty of Paying Living Expenses: Not on file  Food Insecurity:  Worried About Programme researcher, broadcasting/film/video in the Last Year: Not on file   [employer] of Food in the Last Year: Not on file  Transportation Needs:    Lack of Transportation (Medical): Not on file   Lack of Transportation (Non-Medical): Not on file  Physical Activity:    Days of Exercise per Week: Not on file   Minutes of Exercise per Session: Not on file  Stress:    Feeling of Stress : Not on file  Social Connections:    Frequency of Communication with Friends and Family: Not on file   Frequency of Social Gatherings with Friends and Family: Not on file   Attends Religious Services: Not on file   Active Member of Clubs or  Organizations: Not on file   Attends Banker Meetings: Not on file   Marital Status: Not on file   Additional Social History:         see H&P                Sleep: Fair  Appetite:  Fair  Current Medications: Current Facility-Administered Medications  Medication Dose Route Frequency Provider Last Rate Last Admin   acetaminophen (TYLENOL) tablet 650 mg  650 mg Oral Q6H PRN Jackelyn Poling, NP   650 mg at 04/20/20 0638   alum & mag hydroxide-simeth (MAALOX/MYLANTA) 200-200-20 MG/5ML suspension 30 mL  30 mL Oral Q4H PRN Jackelyn Poling, NP       hydrOXYzine (ATARAX/VISTARIL) tablet 25 mg  25 mg Oral TID PRN Jackelyn Poling, NP   25 mg at 04/20/20 3382   loperamide (IMODIUM) capsule 2-4 mg  2-4 mg Oral PRN Jackelyn Poling, NP       LORazepam (ATIVAN) tablet 1 mg  1 mg Oral Q6H PRN Jackelyn Poling, NP   1 mg at 04/19/20 5053   magnesium hydroxide (MILK OF MAGNESIA) suspension 30 mL  30 mL Oral Daily PRN Jackelyn Poling, NP       nicotine (NICODERM CQ - dosed in mg/24 hours) patch 21 mg  21 mg Transdermal Daily Nira Conn A, NP       ondansetron (ZOFRAN-ODT) disintegrating tablet 4 mg  4 mg Oral Q6H PRN Jackelyn Poling, NP   4 mg at 04/19/20 9767   sertraline (ZOLOFT) tablet 25 mg  25 mg Oral Daily Oneta Rack, NP       traZODone (DESYREL) tablet 50 mg  50 mg Oral QHS PRN Jackelyn Poling, NP   50 mg at 04/19/20 2101    Lab Results:  Results for orders placed or performed during the hospital encounter of 04/17/20 (from the past 48 hour(s))  Comprehensive metabolic panel     Status: Abnormal   Collection Time: 04/19/20  6:41 AM  Result Value Ref Range   Sodium 140 135 - 145 mmol/L   Potassium 4.3 3.5 - 5.1 mmol/L   Chloride 108 98 - 111 mmol/L   CO2 24 22 - 32 mmol/L   Glucose, Bld 92 70 - 99 mg/dL    Comment: Glucose reference range applies only to samples taken after fasting for at least 8 hours.   BUN 10 6 - 20 mg/dL   Creatinine, Ser 3.41 0.61 - 1.24  mg/dL   Calcium 9.2 8.9 - 93.7 mg/dL   Total Protein 6.9 6.5 - 8.1 g/dL   Albumin 3.4 (L) 3.5 - 5.0 g/dL   AST 83 (H) 15 - 41 U/L  ALT 44 0 - 44 U/L   Alkaline Phosphatase 83 38 - 126 U/L   Total Bilirubin 1.7 (H) 0.3 - 1.2 mg/dL   GFR, Estimated >38 >10 mL/min    Comment: (NOTE) Calculated using the CKD-EPI Creatinine Equation (2021)    Anion gap 8 5 - 15    Comment: Performed at [facility], [address] [place], [place] [address]    Blood Alcohol level:  Lab Results  Component Value Date   ETH 205 (H) 04/17/2020   ETH 100 (H) 11/05/2018    Metabolic Disorder Labs: Lab Results  Component Value Date   HGBA1C 5.2 04/18/2020   MPG 103 04/18/2020   No results found for: PROLACTIN Lab Results  Component Value Date   CHOL 194 04/18/2020   TRIG 40 04/18/2020   HDL 73 04/18/2020   CHOLHDL 2.7 04/18/2020   VLDL 8 04/18/2020   LDLCALC 113 (H) 04/18/2020    Physical Findings: AIMS:  , ,  ,  ,    CIWA:  CIWA-Ar Total: 3 COWS:     Musculoskeletal: Strength & Muscle Tone: within normal limits Gait & Station: normal Patient leans: N/A  Psychiatric Specialty Exam: Physical Exam Vitals and nursing note reviewed.  Neurological:     Comments: Minimal tremors with out streched arms  Psychiatric:        Mood and Affect: Mood normal.     Review of Systems  Psychiatric/Behavioral: Positive for decreased concentration. Negative for agitation and suicidal ideas. The patient is nervous/anxious.     Blood pressure 114/70, pulse 70, temperature (!) 94.6 F (34.8 C), resp. rate 20, height 6\' 1"  (1.854 m), weight 123.8 kg, SpO2 (!) 70 %.Body mass index is 36.02 kg/m.  General Appearance: Disheveled  Eye Contact:  Fair  Speech:  Clear and Coherent  Volume:  Normal  Mood:  Anxious and Depressed  Affect:  Constricted and dysphoric  Thought Process:  Coherent  Orientation:  Full (Time, Place, and Person)  Thought Content:  WDL and Logical  Suicidal  Thoughts:  denied  Homicidal Thoughts:  denied  Memory:  Immediate;   Fair Recent;   Fair  Judgement:  Fair  Insight:  Fair  Psychomotor Activity:  Normal  Concentration:  Concentration: Fair  Recall:  of Knowledge:  Fair  Language:  Good  Akathisia:  No  Handed:  Right  AIMS (if indicated):     Assets:  Communication Skills Desire for Improvement Resilience  ADL's:  Intact  Cognition:  WNL  Sleep:  Number of Hours: 7     Treatment Plan Summary: Daily contact with patient to assess and evaluate symptoms and progress in treatment and Medication management   Treatment plan was reviewed and agreed upon by NP T. Elizeo Rodriques and patient Larry Adams's need for continued inpatient admission on 04/20/2020  Medication management:  -Initiated Zoloft 25 mg p.o. daily -Continue PRN ativan for CIWA>10  Labs reviewed:-As noted on 10/22/2021Hypokalemia, resolved -K 3.3 (10/20) -supplementation ordered 20 meq -re-draw with CMP, results on 10/23=  AST/ALT 83/44, K 4.3  CSW to continue working on discharge disposition Patient encouraged to participate in the therapeutic milieu  11/23, NP 04/20/2020, 11:50 AM

## 2020-04-20 NOTE — Progress Notes (Signed)
   04/20/20 2330  Psych Admission Type (Psych Patients Only)  Admission Status Voluntary  Psychosocial Assessment  Patient Complaints Anxiety  Eye Contact Brief  Facial Expression Flat  Affect Appropriate to circumstance  Speech Logical/coherent  Interaction Minimal  Motor Activity Slow  Appearance/Hygiene Disheveled;Body odor  Behavior Characteristics Appropriate to situation  Mood Anxious  Thought Process  Coherency WDL  Content WDL  Delusions None reported or observed  Perception WDL  Hallucination None reported or observed  Judgment Impaired  Confusion None  Danger to Self  Current suicidal ideation? Denies  Self-Injurious Behavior No self-injurious ideation or behavior indicators observed or expressed   Agreement Not to Harm Self Yes  Description of Agreement verbal agreement to approach staff   Danger to Others  Danger to Others None reported or observed

## 2020-04-20 NOTE — Progress Notes (Signed)
Psychoeducational Group Note  Date:  04/20/2020 Time:  2030  Group Topic/Focus:  wrap up group  Participation Level: Did Not Attend  Participation Quality:  Not Applicable  Affect:  Not Applicable  Cognitive:  Not Applicable  Insight:  Not Applicable  Engagement in Group: Not Applicable  Additional Comments:  Pt was notified that group was beginning but remained in bed.   Marcille Buffy 04/20/2020, 9:12 PM

## 2020-04-21 DIAGNOSIS — F332 Major depressive disorder, recurrent severe without psychotic features: Secondary | ICD-10-CM | POA: Diagnosis not present

## 2020-04-21 MED ORDER — SERTRALINE HCL 50 MG PO TABS
50.0000 mg | ORAL_TABLET | Freq: Every day | ORAL | Status: DC
  Administered 2020-04-22: 50 mg via ORAL
  Filled 2020-04-21 (×2): qty 1
  Filled 2020-04-21: qty 7

## 2020-04-21 MED ORDER — GABAPENTIN 100 MG PO CAPS
200.0000 mg | ORAL_CAPSULE | Freq: Two times a day (BID) | ORAL | Status: DC
  Administered 2020-04-21 – 2020-04-22 (×2): 200 mg via ORAL
  Filled 2020-04-21: qty 28
  Filled 2020-04-21: qty 2
  Filled 2020-04-21: qty 28
  Filled 2020-04-21 (×3): qty 2

## 2020-04-21 MED ORDER — WHITE PETROLATUM EX OINT
TOPICAL_OINTMENT | CUTANEOUS | Status: AC
  Filled 2020-04-21: qty 5

## 2020-04-21 MED ORDER — WHITE PETROLATUM EX OINT
TOPICAL_OINTMENT | CUTANEOUS | Status: AC
  Administered 2020-04-21: 1
  Filled 2020-04-21: qty 5

## 2020-04-21 NOTE — Progress Notes (Signed)
   04/21/20 2353  Psych Admission Type (Psych Patients Only)  Admission Status Voluntary  Psychosocial Assessment  Patient Complaints Anxiety  Eye Contact Brief  Facial Expression Flat  Affect Appropriate to circumstance  Speech Logical/coherent  Interaction Minimal  Motor Activity Slow  Appearance/Hygiene Disheveled  Behavior Characteristics Appropriate to situation  Mood Anxious;Depressed  Thought Process  Coherency WDL  Content WDL  Delusions None reported or observed  Perception WDL  Hallucination None reported or observed  Judgment Impaired  Confusion None  Danger to Self  Current suicidal ideation? Denies  Self-Injurious Behavior No self-injurious ideation or behavior indicators observed or expressed   Agreement Not to Harm Self Yes  Description of Agreement verbal agreement to approach staff   Danger to Others  Danger to Others None reported or observed

## 2020-04-21 NOTE — [facility] Group Notes (Signed)
[facility] LCSW Group Therapy Note ° °Date/Time:  04/21/2020 9:00-10:00 or 10:00-11:00AM ° °Type of Therapy and Topic:  Group Therapy:  Healthy and Unhealthy Supports ° °Participation Level:  Did Not Attend  ° °Description of Group:  Patients in this group were introduced to the idea of adding a variety of healthy supports to address the various needs in their lives.Patients discussed what additional healthy supports could be helpful in their recovery and wellness after discharge in order to prevent future hospitalizations.   An emphasis was placed on using counselor, doctor, therapy groups, 12-step groups, and problem-specific support groups to expand supports.  Several songs were played to emphasize points made throughout group. ° °Therapeutic Goals: ° ° 1)  discuss importance of adding supports to stay well once out of the hospital ° 2)  compare healthy versus unhealthy supports and identify some examples of each ° 3)  generate ideas and descriptions of healthy supports that can be added ° 4)  offer mutual support about how to address unhealthy supports ° 5)  encourage active participation in and adherence to discharge plan °  ° °Summary of Patient Progress:  The patient was invited to group, did not attend. ° ° °Therapeutic Modalities:   °Motivational Interviewing °Brief Solution-Focused Therapy ° °Deaja Rizo Grossman-Orr, LCSW ° °  °  ° ° °

## 2020-04-21 NOTE — Progress Notes (Signed)
   04/21/20 2343  COVID-19 Daily Checkoff  Have you had a fever (temp > 37.80C/100F)  in the past 24 hours?  No  If you have had runny nose, nasal congestion, sneezing in the past 24 hours, has it worsened? No  COVID-19 EXPOSURE  Have you traveled outside the state in the past 14 days? No  Have you been in contact with someone with a confirmed diagnosis of COVID-19 or PUI in the past 14 days without wearing appropriate PPE? No  Have you been living in the same home as a person with confirmed diagnosis of COVID-19 or a PUI (household contact)? No  Have you been diagnosed with COVID-19? No

## 2020-04-21 NOTE — Progress Notes (Signed)
[facility] MD Progress Note  04/21/2020 10:54 AM Larry Adams  MRN:  [id number]   Subjective:   Larry Adams reported " I just need a real cigarette, I am getting anxious"    Evaluation: Larry Adams observed resting in bed, as reported by staff patient is isolative to the room most of the day. Patient reported he has attend some group sessions at night. Patient continues to deny thoughts with self harm. Denied any withdrawal symptoms. Discussed restarting gabapentin for mood stabilization and anxeity. Increased Zoloft 25 mg to 50 mg daily he reported taken and tolerating medications well.  Larry Adams reported he is hopeful to discharge soon. Patient denied depression or depressive symptoms. Denied concerns with appetite. Support, encouragement and reassurance was provided.  Principal Problem: Severe recurrent major depression without psychotic features (HCC) Diagnosis: Principal Problem:   Severe recurrent major depression without psychotic features (HCC) Active Problems:   Moderate alcohol use disorder (HCC)  Total Time spent with patient: 15 minutes  Past Psychiatric History: See H&P  Past Medical History:  Past Medical History:  Diagnosis Date  . Anxiety   . Back pain   . Depression   . Drug overdose   . Mental disorder     Past Surgical History:  Procedure Laterality Date  . HERNIA REPAIR     Family History:  Family History  Family history unknown: Yes   Family Psychiatric  History: see H&P  Social History:  Social History   Substance and Sexual Activity  Alcohol Use Yes  . Alcohol/week: 6.0 standard drinks  . Types: 6 Cans of beer per week   Comment: PTA 25 oz of beer; drinking all day everyday for 2 months     Social History   Substance and Sexual Activity  Drug Use Yes  . Types: IV, Fentanyl   Comment: Overdose on Heroin; IV Fentanyl use    Social History   Socioeconomic History  . Marital status: Single    Spouse name: Not on file  . Number of children: Not on file   . Years of education: Not on file  . Highest education level: Not on file  Occupational History  . Not on file  Tobacco Use  . Smoking status: Current Every Day Smoker    Packs/day: 1.00    Types: Cigarettes  . Smokeless tobacco: Former Clinical biochemist  . Vaping Use: Former  Substance and Sexual Activity  . Alcohol use: Yes    Alcohol/week: 6.0 standard drinks    Types: 6 Cans of beer per week    Comment: PTA 25 oz of beer; drinking all day everyday for 2 months  . Drug use: Yes    Types: IV, Fentanyl    Comment: Overdose on Heroin; IV Fentanyl use  . Sexual activity: Yes  Other Topics Concern  . Not on file  Social History Narrative  . Not on file   Social Determinants of Health   Financial Resource Strain:   . Difficulty of Paying Living Expenses: Not on file  Food Insecurity:   . Worried About Programme researcher, broadcasting/film/video in the Last Year: Not on file  . Ran Out of Food in the Last Year: Not on file  Transportation Needs:   . Lack of Transportation (Medical): Not on file  . Lack of Transportation (Non-Medical): Not on file  Physical Activity:   . Days of Exercise per Week: Not on file  . Minutes of Exercise per Session: Not on file  Stress:   .  Feeling of Stress : Not on file  Social Connections:   . Frequency of Communication with Friends and Family: Not on file  . Frequency of Social Gatherings with Friends and Family: Not on file  . Attends Religious Services: Not on file  . Active Member of Clubs or Organizations: Not on file  . Attends Banker Meetings: Not on file  . Marital Status: Not on file   Additional Social History:         see H&P                Sleep: Fair  Appetite:  Fair  Current Medications: Current Facility-Administered Medications  Medication Dose Route Frequency Provider Last Rate Last Admin  . acetaminophen (TYLENOL) tablet 650 mg  650 mg Oral Q6H PRN Nira Conn A, NP   650 mg at 04/20/20 1700  . alum & mag  hydroxide-simeth (MAALOX/MYLANTA) 200-200-20 MG/5ML suspension 30 mL  30 mL Oral Q4H PRN Nira Conn A, NP      . hydrOXYzine (ATARAX/VISTARIL) tablet 25 mg  25 mg Oral TID PRN Nira Conn A, NP   25 mg at 04/20/20 1722  . magnesium hydroxide (MILK OF MAGNESIA) suspension 30 mL  30 mL Oral Daily PRN Nira Conn A, NP   30 mL at 04/20/20 1736  . nicotine (NICODERM CQ - dosed in mg/24 hours) patch 21 mg  21 mg Transdermal Daily Nira Conn A, NP      . sertraline (ZOLOFT) tablet 25 mg  25 mg Oral Daily Oneta Rack, NP   25 mg at 04/21/20 0848  . traZODone (DESYREL) tablet 50 mg  50 mg Oral QHS PRN Nira Conn A, NP   50 mg at 04/20/20 2125    Lab Results:  No results found for this or any previous visit (from the past 48 hour(s)).  Blood Alcohol level:  Lab Results  Component Value Date   ETH 205 (H) 04/17/2020   ETH 100 (H) 11/05/2018    Metabolic Disorder Labs: Lab Results  Component Value Date   HGBA1C 5.2 04/18/2020   MPG 103 04/18/2020   No results found for: PROLACTIN Lab Results  Component Value Date   CHOL 194 04/18/2020   TRIG 40 04/18/2020   HDL 73 04/18/2020   CHOLHDL 2.7 04/18/2020   VLDL 8 04/18/2020   LDLCALC 113 (H) 04/18/2020    Physical Findings: AIMS: Facial and Oral Movements Muscles of Facial Expression: None, normal Lips and Perioral Area: None, normal Jaw: None, normal Tongue: None, normal,Extremity Movements Upper (arms, wrists, hands, fingers): None, normal Lower (legs, knees, ankles, toes): None, normal, Trunk Movements Neck, shoulders, hips: None, normal, Overall Severity Severity of abnormal movements (highest score from questions above): None, normal Incapacitation due to abnormal movements: None, normal Patient's awareness of abnormal movements (rate only patient's report): No Awareness, Dental Status Current problems with teeth and/or dentures?: No Does patient usually wear dentures?: No  CIWA:  CIWA-Ar Total: 1 COWS:      Musculoskeletal: Strength & Muscle Tone: within normal limits Gait & Station: normal Patient leans: N/A  Psychiatric Specialty Exam: Physical Exam Vitals and nursing note reviewed.  Neurological:     Comments: Minimal tremors with out streched arms  Psychiatric:        Mood and Affect: Mood normal.     Review of Systems  Psychiatric/Behavioral: Positive for decreased concentration. Negative for agitation and suicidal ideas. The patient is nervous/anxious.     Blood pressure 109/71, pulse  91, temperature 97.8 F (36.6 C), temperature source Oral, resp. rate 20, height 6\' 1"  (1.854 m), weight 123.8 kg, SpO2 99 %.Body mass index is 36.02 kg/m.  General Appearance: Disheveled  Eye Contact:  Fair  Speech:  Clear and Coherent  Volume:  Normal  Mood:  Anxious and Depressed  Affect:  Constricted and dysphoric  Thought Process:  Coherent  Orientation:  Full (Time, Place, and Person)  Thought Content:  WDL and Logical  Suicidal Thoughts:  denied  Homicidal Thoughts:  denied  Memory:  Immediate;   Fair Recent;   Fair  Judgement:  Fair  Insight:  Fair  Psychomotor Activity:  Normal  Concentration:  Concentration: Fair  Recall:  of Knowledge:  Fair  Language:  Good  Akathisia:  No  Handed:  Right  AIMS (if indicated):     Assets:  Communication Skills Desire for Improvement Resilience  ADL's:  Intact  Cognition:  WNL  Sleep:  Number of Hours: 6.5     Treatment plan was reviewed and agreed upon by NP T. Leverett Camplin and patient Larry Adams's need for continued inpatient admission on 04/21/2020  Medication management:  Increased  Zoloft 25 mg to 50 mg p.o. daily for depression/anxeity  Start Gabapentin 200 mg po BID for mood stablization -Continue PRN ativan for CIWA>10  Labs reviewed:-As noted on 10/22/2021Hypokalemia, resolved -K 3.3 (10/20) -supplementation ordered 20 meq -re-draw with CMP, results on 10/23=  AST/ALT 83/44, K 4.3  CSW to continue  working on discharge disposition Patient encouraged to participate in the therapeutic milieu  11/23, NP 04/21/2020, 10:54 AM

## 2020-04-21 NOTE — Progress Notes (Signed)
Psychoeducational Group Note  Date:  04/21/2020 Time:  2030  Group Topic/Focus:  wrap up group  Participation Level: Did Not Attend  Participation Quality:  Not Applicable  Affect:  Not Applicable  Cognitive:  Not Applicable  Insight:  Not Applicable  Engagement in Group: Not Applicable  Additional Comments:  Pt was notified that group was beginning but remained in bed.   Marcille Buffy 04/21/2020, 8:51 PM

## 2020-04-21 NOTE — Plan of Care (Signed)
Nurse discussed anxiety, depression and coping skills with patient.  

## 2020-04-22 MED ORDER — TRAZODONE HCL 50 MG PO TABS: 50.0000 mg | ORAL_TABLET | Freq: Every evening | ORAL | 0 refills | Status: DC | PRN

## 2020-04-22 MED ORDER — GABAPENTIN 100 MG PO CAPS: 200.0000 mg | ORAL_CAPSULE | Freq: Two times a day (BID) | ORAL | 0 refills | Status: DC

## 2020-04-22 MED ORDER — SERTRALINE HCL 50 MG PO TABS: 50.0000 mg | ORAL_TABLET | Freq: Every day | ORAL | 0 refills | Status: DC

## 2020-04-22 NOTE — Plan of Care (Signed)
Nurse discussed anxiety, depression and coping skills with patient.  

## 2020-04-22 NOTE — Progress Notes (Signed)
Patient did not attend morning goals group.  

## 2020-04-22 NOTE — Discharge Summary (Signed)
Physician Discharge Summary Note  Patient:  Larry Adams is an 34 y.o., male MRN:  [id number] DOB:  June 01, 1986 Patient phone:  [phone] (home)  Patient address:   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 Unsure of dx. Usually does not continue medications on discharge  Past Medical History:  Past Medical History:  Diagnosis Date  . Anxiety   . Back pain   . Depression   . Drug overdose   . Mental disorder     Past Surgical History:  Procedure Laterality Date  . HERNIA REPAIR     Family History:  Family History  Family history unknown: Yes   Family Psychiatric  History: "they all got something" Social History:  Social History   Substance and Sexual Activity  Alcohol Use Yes  . Alcohol/week: 6.0 standard drinks  . Types: 6 Cans of beer per week   Comment: PTA 25 oz of beer; drinking all day everyday for 2 months     Social History   Substance and Sexual Activity  Drug Use Yes  . Types: IV, Fentanyl   Comment: Overdose on Heroin; IV Fentanyl use    Social History   Socioeconomic History  . Marital status: Single    Spouse name: Not on file  . Number of children: Not on file  . Years of education: Not on file  . Highest education level: Not on file  Occupational History  . Not on file  Tobacco Use  . Smoking status: Current Every Day Smoker    Packs/day: 1.00    Types: Cigarettes  . Smokeless tobacco: Former Clinical biochemist  . Vaping Use: Former  Substance  and Sexual Activity  . Alcohol use: Yes    Alcohol/week: 6.0 standard drinks    Types: 6 Cans of beer per week    Comment: PTA 25 oz of beer; drinking all day everyday for 2 months  . Drug use: Yes    Types: IV, Fentanyl    Comment: Overdose on Heroin; IV Fentanyl use  . Sexual activity: Yes  Other Topics Concern  . Not on file  Social History Narrative  . Not on file   Social Determinants of Health   Financial Resource Strain:   . Difficulty of Paying Living Expenses: Not on file  Food Insecurity:   . Worried About Programme researcher, broadcasting/film/video in the Last Year: Not on file  . Ran Out of Food in the Last Year: Not on file  Transportation Needs:   . Lack of Transportation (Medical): Not on file  . Lack of Transportation (Non-Medical): Not on file  Physical Activity:   . Days of Exercise per Week: Not on file  . Minutes of Exercise per Session: Not on file  Stress:   . Feeling of Stress : Not on file  Social Connections:   . Frequency of Communication with Friends and Family: Not on file  . Frequency of Social Gatherings with Friends and Family: Not on file  . Attends Religious Services: Not on file  . Active Member of Clubs or  Organizations: Not on file  . Attends Banker Meetings: Not on file  . Marital Status: Not on file    Hospital Course:  Per [facility] BH Assessment -Clinician reviewed note by Dr. Jeraldine Loots.Patient presents concern of suicidal ideation and polysubstance abuse. Patient denies physical pain, complaints. He notes a history of depression, ongoing abuse of fentanyl, IV and alcohol. He also has a history of anxiety. He has previously been on SSRI, benzodiazepine, but has no medication currently. He notes that he relapsed into substance abuse a few months ago, since that time has been substantially, using fentanyl daily. Today, he was on the edge of running into traffic, thoughts of killing himself.  Patient says that he is homeless. Today he said that a  car almost hit him and he thought about how easy it would be to step into traffic to kill himself. He says that he still feels this way now. Patient denies previous attempts.   Patient denies any HI or A/V hallucinations. Patient does say that about 3 months ago he relapsed on ETOH. He is drinking twelve 16 oz malt liquors daily. Patient last drank prior to arrival. His BAL was 205 at 17:15. Patient also uses Fentayl IV. He says he used some today but had not used any in the four days prior. UDS is negative for opiates.  Patient says he will do well with sobriety for a long time then "I get too comfortable and I think I can handle it." Referring to going back to drugs when he gets money. Patient is interested in going to a [employer] or an [place] house if he is given the chance.  Pt has good eye contact and is oriented x3. Patient is not responding to internal stimuli. He is not engaged in delusional thought process. Patient thought process is logical and coherent. He says that his appetite is good. His sleep however is interrupted so he will drink before sleeping.   Patient was at [facility] in August 2021 and said he had a bad experience. He said they gave him subutex which gave him bad hallucinations. He does not want that again. Last time at [facility] was 10/2018. Patient has no outpatient care.  Inpatient interview 04/18/20 Chart reviewed. Multiple admissions to [facility], most recently 10/2018 in the setting of heroin, was discharged to [facility] with gabapentin 300 TID, remeron 15, venlafaxine 75 mg. Etoh 205 on presentation, UDS neg. On 10/20.  K low  Patient provides story as above, states that after last admission as above he went to [facility] and then subsequently went to recovery home in [facility]. He had been there for ~1 year and left in May of this year. States that he "knew it [relapse] was coming" and left. He returned to [facility] as this is where he is originally  from and where his support system is which consists of several close friends. Since He has returned he describes his life having a "constant downward spiral", has been homeless,  and has been drinking daily. He reports drinking 8-12 bottles (12-16 oz)  of malt liquor daily, last drink yesterday early afternoon before coming to the hospital. Denies h/o withdrawal seizures but reports h/o tremors. He states that yesterday he got so "Fed up" with everything and had a plan to kill himself, thinking about running into traffic. His longest period of sobriety is ~15 months in 2017.  He reports his last IV fentanyl use was about a week ago but had also  been using daily prior to that after relapse. Patient references [facility] and [facility] as places he would be interested in.   He states that it has been a long time since he has seen a psychiatrist outpatient but will see them when he is admitted. Reports being on lots of medications but describes discontinuing medications after leaving the hospital.   04/22/20: Mr. Hughlett was admitted for fentanyl and alcohol use with suicidal ideation. He remained on the [facility] unit for five days. CIWA protocol was started with Ativan PRN CIWA>10 for alcohol withdrawal. He was started on Zoloft, gabapentin, and trazodone. He participated in group therapy on the unit. He responded well to treatment with no adverse effects reported. He has shown improved mood, affect, sleep, and interaction. He denies any SI/HI/AVH and contracts for safety. He denies withdrawal symptoms. He is discharging on the medications listed below. He agrees to follow up at [facility] and [facility] (see below). Patient is provided with prescriptions for medications upon discharge. He is discharging to [facility] via [employer] [facility].  Physical Findings: AIMS: Facial and Oral Movements Muscles of Facial Expression: None, normal Lips and Perioral Area: None, normal Jaw: None, normal Tongue: None,  normal,Extremity Movements Upper (arms, wrists, hands, fingers): None, normal Lower (legs, knees, ankles, toes): None, normal, Trunk Movements Neck, shoulders, hips: None, normal, Overall Severity Severity of abnormal movements (highest score from questions above): None, normal Incapacitation due to abnormal movements: None, normal Patient's awareness of abnormal movements (rate only patient's report): No Awareness, Dental Status Current problems with teeth and/or dentures?: No Does patient usually wear dentures?: No  CIWA:  CIWA-Ar Total: 3 COWS:     Musculoskeletal: Strength & Muscle Tone: within normal limits Gait & Station: normal Patient leans: N/A  Psychiatric Specialty Exam: Physical Exam Vitals and nursing note reviewed.  Constitutional:      Appearance: He is well-developed.  Cardiovascular:     Rate and Rhythm: Normal rate.  Pulmonary:     Effort: Pulmonary effort is normal.  Neurological:     Mental Status: He is alert and oriented to person, place, and time.     Review of Systems  Constitutional: Negative.   Respiratory: Negative for cough and shortness of breath.   Psychiatric/Behavioral: Negative for agitation, behavioral problems, confusion, decreased concentration, dysphoric mood, hallucinations, self-injury, sleep disturbance and suicidal ideas. The patient is not nervous/anxious and is not hyperactive.     Blood pressure 120/87, pulse 85, temperature 98.2 F (36.8 C), temperature source Oral, resp. rate 20, height 6\' 1"  (1.854 m), weight 123.8 kg, SpO2 100 %.Body mass index is 36.02 kg/m.  See MD's discharge SRA    Have you used any form of tobacco in the last 30 days? (Cigarettes, Smokeless Tobacco, Cigars, and/or Pipes): Yes  Has this patient used any form of tobacco in the last 30 days? (Cigarettes, Smokeless Tobacco, Cigars, and/or Pipes)  Yes, A prescription for an FDA-approved tobacco cessation medication was offered at discharge and the patient  refused  Blood Alcohol level:  Lab Results  Component Value Date   ETH 205 (H) 04/17/2020   ETH 100 (H) 11/05/2018    Metabolic Disorder Labs:  Lab Results  Component Value Date   HGBA1C 5.2 04/18/2020   MPG 103 04/18/2020   No results found for: PROLACTIN Lab Results  Component Value Date   CHOL 194 04/18/2020   TRIG 40 04/18/2020   HDL 73 04/18/2020   CHOLHDL 2.7 04/18/2020   VLDL 8  04/18/2020   LDLCALC 113 (H) 04/18/2020    See Psychiatric Specialty Exam and Suicide Risk Assessment completed by Attending Physician prior to discharge.  Discharge destination:  Other:  [facility]  Is patient on multiple antipsychotic therapies at discharge:  No   Has Patient had three or more failed trials of antipsychotic monotherapy by history:  No  Recommended Plan for Multiple Antipsychotic Therapies: NA  Discharge Instructions    Diet - low sodium heart healthy   Complete by: As directed    Increase activity slowly   Complete by: As directed    Increase activity slowly   Complete by: As directed      Allergies as of 04/22/2020      Reactions   Morphine And Related Hives      Medication List    STOP taking these medications   mirtazapine 15 MG tablet Commonly known as: REMERON   venlafaxine XR 75 MG 24 hr capsule Commonly known as: EFFEXOR-XR     TAKE these medications     Indication  AIRBORNE GUMMIES PO Take 3 tablets by mouth daily. Gummies  Indication: nutritional supplementation   gabapentin 100 MG capsule Commonly known as: NEURONTIN Take 2 capsules (200 mg total) by mouth 2 (two) times daily. What changed:   medication strength  how much to take  when to take this  Indication: Abuse or Misuse of Alcohol   sertraline 50 MG tablet Commonly known as: ZOLOFT Take 1 tablet (50 mg total) by mouth daily. Start taking on: April 23, 2020  Indication: Major Depressive Disorder   traZODone 50 MG tablet Commonly known as: DESYREL Take 1 tablet (50 mg  total) by mouth at bedtime as needed for sleep.  Indication: Trouble Sleeping       Follow-up Information    [facility]-. Go on 04/22/2020.   Why: You will discharge to [facility]. Please participate in services and progragms reccomended by the agency.  Contact information: Phone:   [phone]       [facility]. Go on 04/26/2020.   Specialty: Behavioral Health Why: You have a walk in appointment for therapy on 04/26/20 at 12:30 pm.  You also have an appointment for medication management on 05/20/20 at 1:00 pm.  These appointments will be held in person.  Contact information: [address] [place] [address] [phone]              Follow-up recommendations: Activity as tolerated. Diet as recommended by primary care physician. Keep all scheduled follow-up appointments as recommended.   Comments:   Patient is instructed to take all prescribed medications as recommended. Report any side effects or adverse reactions to your outpatient psychiatrist. Patient is instructed to abstain from alcohol and illegal drugs while on prescription medications. In the event of worsening symptoms, patient is instructed to call the crisis hotline, 911, or go to the nearest emergency department for evaluation and treatment.  Signed: Aldean Baker, NP 04/22/2020, 1:18 PM

## 2020-04-22 NOTE — Progress Notes (Signed)
°  [facility] Adult Case Management Discharge Plan :  Will you be returning to the same living situation after discharge:  No. At discharge, do you have transportation home?: Yes,  SW will provide safe transport. Do you have the ability to pay for your medications: No.  Release of information consent forms completed and in the chart;  Patient's signature needed at discharge.  Patient to Follow up at:  Follow-up Information    [facility]. Call.   Contact information: Phone:   [phone]              Next level of care provider has access to [facility] Link:yes  Safety Planning and Suicide Prevention discussed: Yes,  completed with Pt.   Have you used any form of tobacco in the last 30 days? (Cigarettes, Smokeless Tobacco, Cigars, and/or Pipes): Yes  Has patient been referred to the Quitline?: Patient refused referral  Patient has been referred for addiction treatment: Yes  Jacinta Shoe, LCSW 04/22/2020, 9:48 AM

## 2020-04-22 NOTE — [facility] Suicide Risk Assessment (Signed)
[facility] Discharge Suicide Risk Assessment   Principal Problem: Severe recurrent major depression without psychotic features [facility]) Discharge Diagnoses: Principal Problem:   Severe recurrent major depression without psychotic features (HCC) Active Problems:   Moderate alcohol use disorder (HCC)   Total Time spent with patient: 15 minutes  Musculoskeletal: Strength & Muscle Tone: within normal limits Gait & Station: normal Patient leans: N/A  Psychiatric Specialty Exam: Review of Systems  All other systems reviewed and are negative.   Blood pressure 120/87, pulse 85, temperature 98.2 F (36.8 C), temperature source Oral, resp. rate 20, height 6\' 1"  (1.854 m), weight 123.8 kg, SpO2 100 %.Body mass index is 36.02 kg/m.  General Appearance: Casual  Eye Contact::  Fair  Speech:  Normal Rate409  Volume:  Normal  Mood:  Euthymic  Affect:  Congruent  Thought Process:  Coherent and Descriptions of Associations: Intact  Orientation:  Full (Time, Place, and Person)  Thought Content:  Logical  Suicidal Thoughts:  No  Homicidal Thoughts:  No  Memory:  Immediate;   Fair Recent;   Fair Remote;   Fair  Judgement:  Intact  Insight:  Fair  Psychomotor Activity:  Normal  Concentration:  Fair  Recall:  Good  Fund of Knowledge:Fair  Language: Good  Akathisia:  Negative  Handed:  Right  AIMS (if indicated):     Assets:  Desire for Improvement Resilience  Sleep:  Number of Hours: 5.5  Cognition: WNL  ADL's:  Intact   Mental Status Per Nursing Assessment::   On Admission:  NA  Demographic Factors:  Male, Caucasian, Low socioeconomic status, Living alone and Unemployed  Loss Factors: NA  Historical Factors: Impulsivity  Risk Reduction Factors:   Positive coping skills or problem solving skills  Continued Clinical Symptoms:  Depression:   Comorbid alcohol abuse/dependence Impulsivity Alcohol/Substance Abuse/Dependencies  Cognitive Features That Contribute To Risk:  None     Suicide Risk:  Minimal: No identifiable suicidal ideation.  Patients presenting with no risk factors but with morbid ruminations; may be classified as minimal risk based on the severity of the depressive symptoms   Follow-up Information    [facility]. Call.   Contact information: Phone:   [phone]              Plan Of Care/Follow-up recommendations:  Activity:  ad lib  [phone], MD 04/22/2020, 9:35 AM

## 2020-04-24 ENCOUNTER — Encounter (HOSPITAL_COMMUNITY): Payer: Self-pay

## 2020-04-24 ENCOUNTER — Other Ambulatory Visit: Payer: Self-pay

## 2020-04-24 ENCOUNTER — Emergency Department (HOSPITAL_COMMUNITY)
Admission: EM | Admit: 2020-04-24 | Discharge: 2020-04-24 | Disposition: A | Discharge status: Home / Self Care | Payer: Medicaid Other | Attending: Emergency Medicine | Admitting: Emergency Medicine

## 2020-04-24 DIAGNOSIS — F1523 Other stimulant dependence with withdrawal: Secondary | ICD-10-CM | POA: Insufficient documentation

## 2020-04-24 DIAGNOSIS — F17210 Nicotine dependence, cigarettes, uncomplicated: Secondary | ICD-10-CM | POA: Insufficient documentation

## 2020-04-24 DIAGNOSIS — F1593 Other stimulant use, unspecified with withdrawal: Secondary | ICD-10-CM

## 2020-04-24 MED ORDER — HYDROXYZINE HCL 25 MG PO TABS
25.0000 mg | ORAL_TABLET | Freq: Once | ORAL | Status: AC
  Administered 2020-04-24: 25 mg via ORAL
  Filled 2020-04-24: qty 1

## 2020-04-24 MED ORDER — HYDROXYZINE HCL 25 MG PO TABS: 25.0000 mg | ORAL_TABLET | Freq: Three times a day (TID) | ORAL | 0 refills | Status: DC | PRN

## 2020-04-24 NOTE — ED Triage Notes (Signed)
Pt stated he was here 10/20 for withdrawal from alcohol and fentanyl. Pt states he has abstained from both alcohol and fentanyl since 10/20. Pt c/o fatigue, dizziness, anxiety, and "spacing out".

## 2020-04-24 NOTE — Discharge Instructions (Addendum)
Follow-up in the clinic as needed 

## 2020-04-24 NOTE — ED Provider Notes (Signed)
[facility]-EMERGENCY DEPT Provider Note   CSN: [id number] Arrival date & time: 04/24/20  1223     History Chief Complaint  Patient presents with  . Withdrawal    Larry Adams is a 34 y.o. male.  34 year old male presents with concerns for withdrawal from opiates.  States that he was abusing alcohol as well as fentanyl for a long time.  Last use was over 7 days.  Was hospitalized at behavioral health for concurrent SI.  Denies any SI or HI at this time.  States he has had some chills and body aches.  Denies any fever or chills.  No vomiting or diarrhea.  No severe abdominal discomfort.  No treatment used prior to arrival.  States the Ativan had helped him before in the past        Past Medical History:  Diagnosis Date  . Anxiety   . Back pain   . Depression   . Drug overdose   . Mental disorder     Patient Active Problem List   Diagnosis Date Noted  . Acute respiratory failure with hypoxia (HCC) 01/11/2020  . Polysubstance abuse (HCC) 01/11/2020  . Aspiration pneumonia (HCC)   . Severe recurrent major depression without psychotic features (HCC) 11/06/2018  . Alcohol abuse with alcohol-induced mood disorder (HCC) 09/12/2017  . MDD (major depressive disorder), recurrent severe, without psychosis (HCC) 09/12/2017  . Benzodiazepine dependence (HCC) 07/23/2014  . Moderate alcohol use disorder (HCC) 07/23/2014  . Substance induced mood disorder (HCC) 07/23/2014  . 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 HERNIA REPAIR         Family History  Family history unknown: Yes    Social History   Tobacco Use  . Smoking status: Current Every Day Smoker    Packs/day: 1.00    Types: Cigarettes  . Smokeless tobacco: Former Clinical biochemist  . Vaping Use: Former  Substance Use Topics  . Alcohol use: Yes    Alcohol/week: 6.0 standard drinks    Types: 6 Cans of beer per week     Comment: PTA 25 oz of beer; drinking all day everyday for 2 months  . Drug use: Yes    Types: IV, Fentanyl    Comment: Overdose on Heroin; IV Fentanyl use    Home Medications Prior to Admission medications   Medication Sig Start Date End Date Taking? Authorizing Provider  gabapentin (NEURONTIN) 100 MG capsule Take 2 capsules (200 mg total) by mouth 2 (two) times daily. 04/22/20   Antonieta Pert, MD  Multiple Vitamins-Minerals (AIRBORNE GUMMIES PO) Take 3 tablets by mouth daily. Gummies    [provider]  sertraline (ZOLOFT) 50 MG tablet Take 1 tablet (50 mg total) by mouth daily. 04/23/20   Antonieta Pert, MD  traZODone (DESYREL) 50 MG tablet Take 1 tablet (50 mg total) by mouth at bedtime as needed for sleep. 04/22/20   Antonieta Pert, MD    Allergies    Morphine and related  Review of Systems   Review of Systems  All other systems reviewed and are negative.   Physical Exam Updated Vital Signs BP 124/78   Pulse 92   Temp 98.3 F (36.8 C) (Oral)   Resp 17   SpO2 99%   Physical Exam Vitals and nursing note reviewed.  Constitutional:      General: He is not in acute distress.    Appearance: Normal appearance. He  is well-developed. He is not toxic-appearing.  HENT:     Head: Normocephalic and atraumatic.  Eyes:     General: Lids are normal.     Conjunctiva/sclera: Conjunctivae normal.     Pupils: Pupils are equal, round, and reactive to light.  Neck:     Thyroid: No thyroid mass.     Trachea: No tracheal deviation.  Cardiovascular:     Rate and Rhythm: Normal rate and regular rhythm.     Heart sounds: Normal heart sounds. No murmur heard.  No gallop.   Pulmonary:     Effort: Pulmonary effort is normal. No respiratory distress.     Breath sounds: Normal breath sounds. No stridor. No decreased breath sounds, wheezing, rhonchi or rales.  Abdominal:     General: Bowel sounds are normal. There is no distension.     Palpations: Abdomen is soft.      Tenderness: There is no abdominal tenderness. There is no rebound.  Musculoskeletal:        General: No tenderness. Normal range of motion.     Cervical back: Normal range of motion and neck supple.  Skin:    General: Skin is warm and dry.     Findings: No abrasion or rash.  Neurological:     Mental Status: He is alert and oriented to person, place, and time.     GCS: GCS eye subscore is 4. GCS verbal subscore is 5. GCS motor subscore is 6.     Cranial Nerves: No cranial nerve deficit.     Sensory: No sensory deficit.  Psychiatric:        Attention and Perception: Attention normal.        Mood and Affect: Mood normal.        Speech: Speech normal.        Behavior: Behavior normal.     ED Results / Procedures / Treatments   Labs (all labs ordered are listed, but only abnormal results are displayed) Labs Reviewed - No data to display  EKG None  Radiology No results found.  Procedures Procedures (including critical care time)  Medications Ordered in ED Medications  hydrOXYzine (ATARAX/VISTARIL) tablet 25 mg (has no administration in time range)    ED Course  I have reviewed the triage vital signs and the nursing notes.  Pertinent labs & imaging results that were available during my care of the patient were reviewed by me and considered in my medical decision making (see chart for details).    MDM Rules/Calculators/A&P                          Patient given Vistaril here.  Patient's vital signs are stable here.  Will prescribe Vistaril and give referral to community wellness center Final Clinical Impression(s) / ED Diagnoses Final diagnoses:  None    Rx / DC Orders ED Discharge Orders    None       Lorre Nick, MD 04/24/20 1435

## 2020-04-24 NOTE — ED Notes (Signed)
ED Provider at bedside. 

## 2020-04-30 ENCOUNTER — Emergency Department (HOSPITAL_COMMUNITY)
Admission: EM | Admit: 2020-04-30 | Discharge: 2020-04-30 | Disposition: A | Discharge status: Home / Self Care | Payer: Medicaid Other | Attending: Emergency Medicine | Admitting: Emergency Medicine

## 2020-04-30 ENCOUNTER — Encounter (HOSPITAL_COMMUNITY): Payer: Self-pay

## 2020-04-30 DIAGNOSIS — R45851 Suicidal ideations: Secondary | ICD-10-CM

## 2020-04-30 DIAGNOSIS — F1910 Other psychoactive substance abuse, uncomplicated: Secondary | ICD-10-CM

## 2020-04-30 DIAGNOSIS — Z20822 Contact with and (suspected) exposure to covid-19: Secondary | ICD-10-CM | POA: Insufficient documentation

## 2020-04-30 DIAGNOSIS — F17210 Nicotine dependence, cigarettes, uncomplicated: Secondary | ICD-10-CM | POA: Insufficient documentation

## 2020-04-30 LAB — RESPIRATORY PANEL BY RT PCR (FLU A&B, COVID)
Influenza A by PCR: NEGATIVE
Influenza B by PCR: NEGATIVE
SARS Coronavirus 2 by RT PCR: NEGATIVE

## 2020-04-30 LAB — CBC
HCT: 46.5 % (ref 39.0–52.0)
Hemoglobin: 15.8 g/dL (ref 13.0–17.0)
MCH: 31.9 pg (ref 26.0–34.0)
MCHC: 34 g/dL (ref 30.0–36.0)
MCV: 93.8 fL (ref 80.0–100.0)
Platelets: 219 10*3/uL (ref 150–400)
RBC: 4.96 MIL/uL (ref 4.22–5.81)
RDW: 13.3 % (ref 11.5–15.5)
WBC: 7.2 10*3/uL (ref 4.0–10.5)
nRBC: 0 % (ref 0.0–0.2)

## 2020-04-30 LAB — COMPREHENSIVE METABOLIC PANEL
ALT: 45 U/L — ABNORMAL HIGH (ref 0–44)
AST: 35 U/L (ref 15–41)
Albumin: 4 g/dL (ref 3.5–5.0)
Alkaline Phosphatase: 67 U/L (ref 38–126)
Anion gap: 7 (ref 5–15)
BUN: 7 mg/dL (ref 6–20)
CO2: 26 mmol/L (ref 22–32)
Calcium: 9.1 mg/dL (ref 8.9–10.3)
Chloride: 110 mmol/L (ref 98–111)
Creatinine, Ser: 0.84 mg/dL (ref 0.61–1.24)
GFR, Estimated: >60 mL/min (ref >60)
Glucose, Bld: 103 mg/dL — ABNORMAL HIGH (ref 70–99)
Potassium: 3.8 mmol/L (ref 3.5–5.1)
Sodium: 143 mmol/L (ref 135–145)
Total Bilirubin: 2.1 mg/dL — ABNORMAL HIGH (ref 0.3–1.2)
Total Protein: 7.4 g/dL (ref 6.5–8.1)

## 2020-04-30 LAB — RAPID URINE DRUG SCREEN, HOSP PERFORMED
Amphetamines: NOT DETECTED
Barbiturates: NOT DETECTED
Benzodiazepines: POSITIVE — AB
Cocaine: NOT DETECTED
Opiates: NOT DETECTED
Tetrahydrocannabinol: NOT DETECTED

## 2020-04-30 LAB — ACETAMINOPHEN LEVEL: Acetaminophen (Tylenol), Serum: <10 ug/mL — ABNORMAL LOW (ref 10–30)

## 2020-04-30 LAB — SALICYLATE LEVEL: Salicylate Lvl: <7 mg/dL — ABNORMAL LOW (ref 7.0–30.0)

## 2020-04-30 LAB — ETHANOL: Alcohol, Ethyl (B): <10 mg/dL (ref <10)

## 2020-04-30 MED ORDER — CLONIDINE HCL 0.1 MG PO TABS
0.1000 mg | ORAL_TABLET | Freq: Four times a day (QID) | ORAL | Status: DC
  Administered 2020-04-30: 0.1 mg via ORAL
  Filled 2020-04-30: qty 1

## 2020-04-30 MED ORDER — NICOTINE 21 MG/24HR TD PT24
21.0000 mg | MEDICATED_PATCH | Freq: Every day | TRANSDERMAL | Status: DC
  Administered 2020-04-30: 21 mg via TRANSDERMAL
  Filled 2020-04-30: qty 1

## 2020-04-30 MED ORDER — LOPERAMIDE HCL 2 MG PO CAPS: 2.0000 mg | ORAL_CAPSULE | ORAL | Status: DC | PRN

## 2020-04-30 MED ORDER — METHOCARBAMOL 500 MG PO TABS
500.0000 mg | ORAL_TABLET | Freq: Three times a day (TID) | ORAL | Status: DC | PRN
  Administered 2020-04-30: 500 mg via ORAL
  Filled 2020-04-30: qty 1

## 2020-04-30 MED ORDER — ALUM & MAG HYDROXIDE-SIMETH 200-200-20 MG/5ML PO SUSP: 30.0000 mL | Freq: Four times a day (QID) | ORAL | Status: DC | PRN

## 2020-04-30 MED ORDER — ZOLPIDEM TARTRATE 5 MG PO TABS: 5.0000 mg | ORAL_TABLET | Freq: Every evening | ORAL | Status: DC | PRN

## 2020-04-30 MED ORDER — CLONIDINE HCL 0.1 MG PO TABS: 0.1000 mg | ORAL_TABLET | Freq: Two times a day (BID) | ORAL | Status: DC

## 2020-04-30 MED ORDER — ACETAMINOPHEN 325 MG PO TABS: 650.0000 mg | ORAL_TABLET | ORAL | Status: DC | PRN

## 2020-04-30 MED ORDER — HYDROXYZINE HCL 25 MG PO TABS
25.0000 mg | ORAL_TABLET | Freq: Four times a day (QID) | ORAL | Status: DC | PRN
  Administered 2020-04-30: 25 mg via ORAL
  Filled 2020-04-30: qty 1

## 2020-04-30 MED ORDER — ONDANSETRON 4 MG PO TBDP
4.0000 mg | ORAL_TABLET | Freq: Four times a day (QID) | ORAL | Status: DC | PRN
  Administered 2020-04-30: 4 mg via ORAL
  Filled 2020-04-30: qty 1

## 2020-04-30 MED ORDER — CLONIDINE HCL 0.1 MG PO TABS: 0.1000 mg | ORAL_TABLET | Freq: Every day | ORAL | Status: DC

## 2020-04-30 MED ORDER — HYDROXYZINE HCL 25 MG PO TABS
25.0000 mg | ORAL_TABLET | Freq: Once | ORAL | Status: AC
  Administered 2020-04-30: 25 mg via ORAL
  Filled 2020-04-30: qty 1

## 2020-04-30 MED ORDER — NAPROXEN 500 MG PO TABS
500.0000 mg | ORAL_TABLET | Freq: Two times a day (BID) | ORAL | Status: DC | PRN
  Administered 2020-04-30: 500 mg via ORAL
  Filled 2020-04-30: qty 1

## 2020-04-30 MED ORDER — ONDANSETRON HCL 4 MG PO TABS: 4.0000 mg | ORAL_TABLET | Freq: Three times a day (TID) | ORAL | Status: DC | PRN

## 2020-04-30 MED ORDER — DICYCLOMINE HCL 20 MG PO TABS
20.0000 mg | ORAL_TABLET | Freq: Four times a day (QID) | ORAL | Status: DC | PRN
  Administered 2020-04-30: 20 mg via ORAL
  Filled 2020-04-30: qty 1

## 2020-04-30 NOTE — ED Notes (Signed)
Patient stated he was a bit jittery and nauseated

## 2020-04-30 NOTE — BH Assessment (Signed)
Assessment Note  Larry Adams is an 34 y.o. male.   Diagnosis:   Past Medical History:  Past Medical History:  Diagnosis Date  . Anxiety   . Back pain   . Depression   . Drug overdose   . Mental disorder     Past Surgical History:  Procedure Laterality Date  . HERNIA REPAIR      Family History:  Family History  Family history unknown: Yes    Social History:  reports that he has been smoking cigarettes. He has been smoking about 1.00 pack per day. He has quit using smokeless tobacco. He reports current alcohol use of about 6.0 standard drinks of alcohol per week. He reports current drug use. Drugs: IV and Fentanyl.  Additional Social History:  Alcohol / Drug Use Pain Medications: Has been using Fentanyl IV Prescriptions: None Over the Counter: Airborne History of alcohol / drug use?: Yes Longest period of sobriety (when/how long): 19 months sobriety Negative Consequences of Use: Work / Programmer, multimedia, Copywriter, advertising relationships, Surveyor, quantity Withdrawal Symptoms: Cramps, Diarrhea, Fever / Chills, Sweats, Tremors, Weakness, Tingling, Nausea / Vomiting (restless, patient reports having #1 pseudo seizure in 2017.) Substance #1 Name of Substance 1: ETOH (malt liquor) 1 - Age of First Use: 34 years of age 43 - Amount (size/oz): Twelve 16oz malt liquors 1 - Frequency: Daily 1 - Duration: Past three months at this rate 1 - Last Use / Amount: 04/17/20  Pt BAL was 205 at 17:15 Substance #2 Name of Substance 2: Fentanyl (IV) 2 - Age of First Use: Teens 2 - Amount (size/oz): About half a gram a day 2 - Frequency: < 1 time in a week 2 - Duration: off and on 2 - Last Use / Amount: Today (10/20) had not used any in the four days previous Substance #3 Name of Substance 3: Benzos(Xanax & Klonopin)  3 - Age of First Use: 20 YOM  3 - Amount (size/oz): 7 Pills  3 - Frequency: Daily  3 - Duration: on-going 3 - Last Use / Amount: 04/29/2020; "I used valium yesterday...Marland KitchenMarland KitchenI used alot" Substance  #4 Name of Substance 4: Heroin 4 - Age of First Use: 34 yrs old 4 - Amount (size/oz): 1/2 gram to 1 gram per day 4 - Frequency: "I was 9 days clean and relapsed 2 days ago" 4 - Duration: on-going 4 - Last Use / Amount: 04/24/2020; "It wasn't alot but enough to re-aggravate the issue"  CIWA: CIWA-Ar BP: (!) 133/91 Pulse Rate: 80 COWS: Clinical Opiate Withdrawal Scale (COWS) Resting Pulse Rate: Pulse Rate 80 or below Sweating: Subjective report of chills or flushing Restlessness: Reports difficulty sitting still, but is able to do so Pupil Size: Pupils pinned or normal size for room light Bone or Joint Aches: Mild diffuse discomfort Runny Nose or Tearing: Nasal stuffiness or unusually moist eyes GI Upset: nausea or loose stool Tremor: Tremor can be felt, but not observed Yawning: No yawning Anxiety or Irritability: Patient obviously irritable/anxious Gooseflesh Skin: Skin is smooth COWS Total Score: 9  Allergies:  Allergies  Allergen Reactions  . Morphine And Related Hives    Home Medications: (Not in a hospital admission)   OB/GYN Status:  No LMP for male patient.  General Assessment Data Location of Assessment: [facility] ED TTS Assessment: In system Is this a Tele or Face-to-Face Assessment?: Tele Assessment Is this an Initial Assessment or a Re-assessment for this encounter?: Initial Assessment Patient Accompanied by:: N/A Language Other than English: No Living Arrangements: Homeless/Shelter What  gender do you identify as?: Male Date Telepsych consult ordered in [facility]:  (04/30/2020) Marital status: Single Pregnancy Status: No Living Arrangements: Alone Can pt return to current living arrangement?:  ([facility] Admission Status: Voluntary Is patient capable of signing voluntary admission?: Yes Referral Source: Self/Family/Friend Insurance type:  (Self Pay )     Crisis Care Plan Living Arrangements: Alone Name of Psychiatrist: None Gretchen Short  05/20/2020-med management ) Name of Therapist: None  Education Status Highest grade of school patient has completed: High School Diploma Is the patient employed, unemployed or receiving disability?: Unemployed, Employed  Risk to self with the past 6 months Suicidal Ideation: Yes-Currently Present Has patient been a risk to self within the past 6 months prior to admission? : Yes Suicidal Intent: Yes-Currently Present Has patient had any suicidal intent within the past 6 months prior to admission? : Yes Is patient at risk for suicide?: Yes Suicidal Plan?: No Has patient had any suicidal plan within the past 6 months prior to admission? : No Specify Current Suicidal Plan:  (n/a) Access to Means: No Specify Access to Suicidal Means:  (denies ) What has been your use of drugs/alcohol within the last 12 months?:  (ETOH, Fantanyl) Previous Attempts/Gestures: No How many times?:  (0) Other Self Harm Risks:  (denies self harm ) Triggers for Past Attempts: None known Intentional Self Injurious Behavior: None Family Suicide History: Yes (maternal family member committed suicide ) Recent stressful life event(s):  (withdrawal symptoms ) Persecutory voices/beliefs?: No Depression: Yes Depression Symptoms: Feeling angry/irritable, Feeling worthless/self pity, Guilt, Fatigue, Isolating, Tearfulness, Loss of interest in usual pleasures Substance abuse history and/or treatment for substance abuse?: Yes Suicide prevention information given to non-admitted patients: Not applicable  Risk to Others within the past 6 months Homicidal Ideation: No Does patient have any lifetime risk of violence toward others beyond the six months prior to admission? : No Thoughts of Harm to Others: No Current Homicidal Intent: No Current Homicidal Plan: No Access to Homicidal Means: No Identified Victim:  (n/a) History of harm to others?: No Assessment of Violence: None Noted Violent Behavior Description:  (patient  is calm and cooperative ) Does patient have access to weapons?: No Criminal Charges Pending?: No Does patient have a court date: No Is patient on probation?: No  Psychosis Hallucinations: Auditory ("Sometimes I see stars") Delusions: None noted  Mental Status Report Appearance/Hygiene: Disheveled Eye Contact: Poor Motor Activity: Freedom of movement Speech: Logical/coherent Level of Consciousness: Alert Mood: Depressed Affect: Appropriate to circumstance Anxiety Level: Panic Attacks Panic attack frequency:  (daily ) Most recent panic attack:  (daily for the past 3 days ) Thought Processes: Coherent, Relevant Judgement: Impaired Orientation: Person, Situation, Place Obsessive Compulsive Thoughts/Behaviors: None  Cognitive Functioning Concentration: Normal Memory: Recent Intact, Remote Intact Is patient IDD: No Insight: Good Impulse Control: Poor Appetite: Good Have you had any weight changes? : Loss Amount of the weight change? (lbs):  (8 pounds in the pat week ) Sleep: Decreased Total Hours of Sleep:  (1-2 hours per night ) Vegetative Symptoms: None  ADLScreening [facility] Assessment Services) Patient's cognitive ability adequate to safely complete daily activities?: Yes Patient able to express need for assistance with ADLs?: Yes Independently performs ADLs?: Yes (appropriate for developmental age)  Prior Inpatient Therapy Prior Inpatient Therapy: Yes Prior Therapy Dates: 02-06-20 Prior Therapy Facilty/Provider(s): [facility] Reason for Treatment: SA  Prior Outpatient Therapy Prior Outpatient Therapy: Yes Prior Therapy Dates:  Gretchen Short 05/20/2020-med management ) Prior Therapy Facilty/Provider(s):  (  [facility] 05/20/2020-med management ) Reason for Treatment:  (med management ) Does patient have an ACCT team?: No Does patient have Intensive In-House Services?  : No Does patient have [facility] services? : No Does patient have P4CC services?:  No  ADL Screening (condition at time of admission) Patient's cognitive ability adequate to safely complete daily activities?: Yes Patient able to express need for assistance with ADLs?: Yes Independently performs ADLs?: Yes (appropriate for developmental age)             Merchant navy officer (For Healthcare) Does Patient Have a Medical Advance Directive?: No Would patient like information on creating a medical advance directive?: No - Patient declined          Disposition:  Disposition Initial Assessment Completed for this Encounter: Yes  On Site Evaluation by:   Reviewed with Physician:    Melynda Ripple 04/30/2020 11:08 AM

## 2020-04-30 NOTE — BH Assessment (Addendum)
Assessment Note  Larry Adams is an 34 y.o. male with history of anxiety, depression, drug overdose, and mental disorder. He presented to [facility], voluntarily. He was referred by a family member (mother). States that he went to [facility] 04/17/2020 for inpatient treatment. He was discharged from [facility] and admitted to [facility] for residential treatment on 04/22/2020. Patient relapsed on 04/24/2020 using Heroin and Benzodiazepines. He presented to the Emergency Department requesting detox and complaints related to withdrawal. Patient was psych cleared and discharged on that same day.   He returns today with the same complaints of continued use and withdrawal symptoms. Most recent use consist of Opiates (Heroin) and Benzodiazepines (Klonopin, Valium and Xanax). He last used both substance yesterday. He binged on both substances and did not provide details on his specific amount of use. Patient started using Benzo's at the age of 81 yrs and Heroin at the age of 34 yrs old. His average amount of use for Heroin is 1/2 gram -1 gram. He uses up to 7 pills of Benzodiazepines. He has used both substances daily since 04/24/20. Prior to 04/24/20, patient was 9 days clean. He also has a distant history of drinking alcohol and using Fentanyl. Current withdrawal symptoms include: Cramps, Diarrhea, Fever / Chills, Sweats, Tremors, Weakness, Tingling, Nausea / Vomiting and Restlessness. States that he had #1 pseudo seizure in 2017 and doesn't know if this was related to his substance use or not.  Longest period of sobriety is 19 days. No prior history of substance use programs besides his most recent admission to [facility].   Patient reports suicidal thoughts today. No plan. No intent. No access to means. No history of suicide. No history of self mutilating behaviors. Current depressive symptoms include:  Feeling angry/irritable, Feeling worthless/self pity, Guilt, Fatigue, Isolating, Tearfulness, Loss of interest in usual  pleasures. His anxiety is severe and he reports daily panic attacks x3. Appetite is poor. States that he has loss 8 pounds in the past week. Sleeping routine is poor and he reports sleeping 1-2 hours per night.  He has a significant family history of mental health illnesses. He denies HI. No history of violent and/or aggressive behaviors. No legal issues. No auditory  hallucinations. However, reports visual hallucinations of stars.   No current therapist. He does report having an appointment with an outpatient provider Gretchen Short) @ the [facility] 05/19/2020. Patient has received inpatient treatment at [facility] on #4 occasions (07/23/14, 09/12/17, 11/06/18, 04/17/20). Upon chart review patient was also admitted to [facility] in the past.   Patient is alert and oriented x4. His speech is appropriate, normal tone. Eye contact is poor. Affect is appropriate. Insight and judgement are fair. Impulse control is fair. Memory is recent and remote intact. He does not appear to be responding to internal stimuli.     Diagnosis:  Major Depressive Disorder, Recurrent, Severe; Anxiety Disorder; Substance Use Disorder   Past Medical History:  Past Medical History:  Diagnosis Date  . Anxiety   . Back pain   . Depression   . Drug overdose   . Mental disorder     Past Surgical History:  Procedure Laterality Date  . HERNIA REPAIR      Family History:  Family History  Family history unknown: Yes    Social History:  Additional Social History:  Alcohol / Drug Use Pain Medications: Has been using Fentanyl IV Prescriptions: None Over the Counter: Airborne History of alcohol / drug use?: Yes Longest period of sobriety (when/how long):  19 months sobriety Negative Consequences of Use: Work / Programmer, multimedia, Copywriter, advertising relationships, Surveyor, quantity Withdrawal Symptoms: Cramps, Diarrhea, Fever / Chills, Sweats, Tremors, Weakness, Tingling, Nausea / Vomiting (restless, patient reports having #1 pseudo seizure in  2017.) Substance #1 Name of Substance 1: ETOH (malt liquor) 1 - Age of First Use: 34 years of age 53 - Amount (size/oz): Twelve 16oz malt liquors 1 - Frequency: Daily 1 - Duration: Past three months at this rate 1 - Last Use / Amount: 04/17/20  Pt BAL was 205 at 17:15 Substance #2 Name of Substance 2: Fentanyl (IV) 2 - Age of First Use: Teens 2 - Amount (size/oz): About half a gram a day 2 - Frequency: < 1 time in a week 2 - Duration: off and on 2 - Last Use / Amount: Today (10/20) had not used any in the four days previous Substance #3 Name of Substance 3: Benzos(Xanax & Klonopin)  3 - Age of First Use: 20 YOM  3 - Amount (size/oz): 7 Pills  3 - Frequency: Daily  3 - Duration: on-going 3 - Last Use / Amount: 04/29/2020; "I used valium yesterday...Marland KitchenMarland KitchenI used alot" Substance #4 Name of Substance 4: Heroin 4 - Age of First Use: 34 yrs old 4 - Amount (size/oz): 1/2 gram to 1 gram per day 4 - Frequency: "I was 9 days clean and relapsed 2 days ago" 4 - Duration: on-going 4 - Last Use / Amount: 04/24/2020; "It wasn't alot but enough to re-aggravate the issue"  CIWA: CIWA-Ar BP: (!) 133/91 Pulse Rate: 80 COWS: Clinical Opiate Withdrawal Scale (COWS) Resting Pulse Rate: Pulse Rate 80 or below Sweating: Subjective report of chills or flushing Restlessness: Reports difficulty sitting still, but is able to do so Pupil Size: Pupils pinned or normal size for room light Bone or Joint Aches: Mild diffuse discomfort Runny Nose or Tearing: Nasal stuffiness or unusually moist eyes GI Upset: nausea or loose stool Tremor: Tremor can be felt, but not observed Yawning: No yawning Anxiety or Irritability: Patient obviously irritable/anxious Gooseflesh Skin: Skin is smooth COWS Total Score: 9  Allergies:  Allergies  Allergen Reactions  . Morphine And Related Hives    Home Medications: (Not in a hospital admission)   OB/GYN Status:  No LMP for male patient.  General Assessment  Data Location of Assessment: [facility] TTS Assessment: In system Is this a Tele or Face-to-Face Assessment?: Tele Assessment Is this an Initial Assessment or a Re-assessment for this encounter?: Initial Assessment Patient Accompanied by:: N/A Language Other than English: No Living Arrangements: Homeless/Shelter What gender do you identify as?: Male Date Telepsych consult ordered in [facility]:  (04/30/2020) Marital status: Single Pregnancy Status: No Living Arrangements: Alone Can pt return to current living arrangement?:  ([facility] Admission Status: Voluntary Is patient capable of signing voluntary admission?: Yes Referral Source: Self/Family/Friend Insurance type:  (Self Pay )     Crisis Care Plan Living Arrangements: Alone Name of Psychiatrist: None Gretchen Short 05/20/2020-med management ) Name of Therapist: None  Education Status Highest grade of school patient has completed: High School Diploma Is the patient employed, unemployed or receiving disability?: Unemployed, Employed  Risk to self with the past 6 months Suicidal Ideation: Yes-Currently Present Has patient been a risk to self within the past 6 months prior to admission? : Yes Suicidal Intent: Yes-Currently Present Has patient had any suicidal intent within the past 6 months prior to admission? : Yes Is patient at risk for suicide?: Yes Suicidal Plan?: No Has patient  had any suicidal plan within the past 6 months prior to admission? : No Specify Current Suicidal Plan:  (n/a) Access to Means: No Specify Access to Suicidal Means:  (denies ) What has been your use of drugs/alcohol within the last 12 months?:  (ETOH, Fantanyl) Previous Attempts/Gestures: No How many times?:  (0) Other Self Harm Risks:  (denies self harm ) Triggers for Past Attempts: None known Intentional Self Injurious Behavior: None Family Suicide History: Yes (maternal family member committed suicide ) Recent stressful life  event(s):  (withdrawal symptoms ) Persecutory voices/beliefs?: No Depression: Yes Depression Symptoms: Feeling angry/irritable, Feeling worthless/self pity, Guilt, Fatigue, Isolating, Tearfulness, Loss of interest in usual pleasures Substance abuse history and/or treatment for substance abuse?: Yes Suicide prevention information given to non-admitted patients: Not applicable  Risk to Others within the past 6 months Homicidal Ideation: No Does patient have any lifetime risk of violence toward others beyond the six months prior to admission? : No Thoughts of Harm to Others: No Current Homicidal Intent: No Current Homicidal Plan: No Access to Homicidal Means: No Identified Victim:  (n/a) History of harm to others?: No Assessment of Violence: None Noted Violent Behavior Description:  (patient is calm and cooperative ) Does patient have access to weapons?: No Criminal Charges Pending?: No Does patient have a court date: No Is patient on probation?: No  Psychosis Hallucinations: Auditory ("Sometimes I see stars") Delusions: None noted  Mental Status Report Appearance/Hygiene: Disheveled Eye Contact: Poor Motor Activity: Freedom of movement Speech: Logical/coherent Level of Consciousness: Alert Mood: Depressed Affect: Appropriate to circumstance Anxiety Level: Panic Attacks Panic attack frequency:  (daily ) Most recent panic attack:  (daily for the past 3 days ) Thought Processes: Coherent, Relevant Judgement: Impaired Orientation: Person, Situation, Place Obsessive Compulsive Thoughts/Behaviors: None  Cognitive Functioning Concentration: Normal Memory: Recent Intact, Remote Intact Is patient IDD: No Insight: Good Impulse Control: Poor Appetite: Good Have you had any weight changes? : Loss Amount of the weight change? (lbs):  (8 pounds in the pat week ) Sleep: Decreased Total Hours of Sleep:  (1-2 hours per night ) Vegetative Symptoms: None  ADLScreening [facility]  Assessment Services) Patient's cognitive ability adequate to safely complete daily activities?: Yes Patient able to express need for assistance with ADLs?: Yes Independently performs ADLs?: Yes (appropriate for developmental age)  Prior Inpatient Therapy Prior Inpatient Therapy: Yes Prior Therapy Dates: 02-06-20 Prior Therapy Facilty/Provider(s): [facility] Reason for Treatment: SA  Prior Outpatient Therapy Prior Outpatient Therapy: Yes Prior Therapy Dates:  Gretchen Short 05/20/2020-med management ) Prior Therapy Facilty/Provider(s):  ([facility] D[facility]ne Keel 05/20/2020-med management ) Reason for Treatment:  (med management ) Does patient have an ACCT team?: No Does patient have Intensive In-House Services?  : No Does patient have [facility] services? : No Does patient have P4CC services?: No  ADL Screening (condition at time of admission) Patient's cognitive ability adequate to safely complete daily activities?: Yes Patient able to express need for assistance with ADLs?: Yes Independently performs ADLs?: Yes (appropriate for developmental age)             Merchant navy officer (For Healthcare) Does Patient Have a Medical Advance Directive?: No Would patient like information on creating a medical advance directive?: No - Patient declined          Disposition: Per Assunta Found, NP, patient is psych cleared. Peer Support consult placed to assist patient with finding a detox program. Following the completion of the detox program patient to [facility] with [facility] for their residential  program.  Disposition Initial Assessment Completed for this Encounter: Yes  On Site Evaluation by:   Reviewed with Physician:    Melynda Ripple 04/30/2020 12:01 PM

## 2020-04-30 NOTE — Patient Outreach (Signed)
Pt was denied a bed at [facility], However Pt stated that he can go back to his Mothers pace of residence until time for him to return back to [facility] [place].

## 2020-04-30 NOTE — BH Assessment (Signed)
[facility] Assessment Progress Note  Per Shuvon Rankin, NP, this voluntary pt does not require psychiatric hospitalization at this time.  Pt is psychiatrically cleared.  Pt has a previously scheduled appointment with Toy Cookey, NP at [facility] on Monday, 05/20/2020 at 13:00 which has been included in pt's discharge instructions, along with referral information for area substance abuse treatment providers.  Pt would also benefit from seeing Peer Support Specialists, and a peer support consult has been ordered for pt.  EDP Kennis Carina, MD and pt's nurse, Diane, have been notified.  Doylene Canning, MA Triage Specialist [phone]

## 2020-04-30 NOTE — ED Provider Notes (Signed)
[facility]-EMERGENCY DEPT Provider Note   CSN: [id number] Arrival date & time: 04/30/20  5366     History Chief Complaint  Patient presents with  . Suicidal    Larry Adams is a 34 y.o. male.  The history is provided by the patient and medical records. No language interpreter was used.     34 year old male significant history of polysubstance abuse, anxiety, depression, presenting with suicidal ideation.  Patient report he abuses heroin and have been seeking for detox help.  Last seen on the 27th of last month.  He was under the care of [facility] [place] was discharged yesterday however reported he did relapse and use heroin several days prior.  He is here reporting feeling anxious, having dry heaves, nausea, abdominal pain, diarrhea and having chills.  He is reportedly having withdrawal from medication and would like to receive detox.  Furthermore, he expressed having suicidal ideation without any specific plan.  He denies any homicidal ideation auditory or visual hallucination.  States that in the past when he had these withdrawal, Valium tends to help.  He has not been vaccinated for COVID-19.  He mentioned having access to prescription meds at his mom's house but not intending to use it to harm himself.    Past Medical History:  Diagnosis Date  . Anxiety   . Back pain   . Depression   . Drug overdose   . Mental disorder     Patient Active Problem List   Diagnosis Date Noted  . Acute respiratory failure with hypoxia (HCC) 01/11/2020  . Polysubstance abuse (HCC) 01/11/2020  . Aspiration pneumonia (HCC)   . Severe recurrent major depression without psychotic features (HCC) 11/06/2018  . Alcohol abuse with alcohol-induced mood disorder (HCC) 09/12/2017  . MDD (major depressive disorder), recurrent severe, without psychosis (HCC) 09/12/2017  . Benzodiazepine dependence (HCC) 07/23/2014  . Moderate alcohol use disorder (HCC) 07/23/2014  .  Substance induced mood disorder (HCC) 07/23/2014  . 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 HERNIA REPAIR         Family History  Family history unknown: Yes    Social History   Tobacco Use  . Smoking status: Current Every Day Smoker    Packs/day: 1.00    Types: Cigarettes  . Smokeless tobacco: Former Clinical biochemist  . Vaping Use: Former  Substance Use Topics  . Alcohol use: Yes    Alcohol/week: 6.0 standard drinks    Types: 6 Cans of beer per week    Comment: PTA 25 oz of beer; drinking all day everyday for 2 months  . Drug use: Yes    Types: IV, Fentanyl    Comment: Overdose on Heroin; IV Fentanyl use    Home Medications Prior to Admission medications   Medication Sig Start Date End Date Taking? Authorizing Provider  gabapentin (NEURONTIN) 100 MG capsule Take 2 capsules (200 mg total) by mouth 2 (two) times daily. 04/22/20   Antonieta Pert, MD  hydrOXYzine (ATARAX/VISTARIL) 25 MG tablet Take 1 tablet (25 mg total) by mouth every 8 (eight) hours as needed. 04/24/20   Lorre Nick, MD  Multiple Vitamins-Minerals (AIRBORNE GUMMIES PO) Take 3 tablets by mouth daily. Gummies    [provider]  sertraline (ZOLOFT) 50 MG tablet Take 1 tablet (50 mg total) by mouth daily. 04/23/20   Antonieta Pert, MD  traZODone (DESYREL) 50 MG tablet Take 1 tablet (  50 mg total) by mouth at bedtime as needed for sleep. 04/22/20   Antonieta Pert, MD    Allergies    Morphine and related  Review of Systems   Review of Systems  All other systems reviewed and are negative.   Physical Exam Updated Vital Signs BP 133/77 (BP Location: Right Arm)   Pulse 85   Temp 98.1 F (36.7 C) (Oral)   Resp 18   SpO2 93%   Physical Exam Vitals and nursing note reviewed.  Constitutional:      General: He is not in acute distress.    Appearance: He is well-developed.  HENT:     Head: Atraumatic.  Eyes:       Conjunctiva/sclera: Conjunctivae normal.  Cardiovascular:     Rate and Rhythm: Normal rate and regular rhythm.     Pulses: Normal pulses.     Heart sounds: Normal heart sounds.  Pulmonary:     Effort: Pulmonary effort is normal.     Breath sounds: Normal breath sounds.  Abdominal:     Palpations: Abdomen is soft.     Tenderness: There is no abdominal tenderness.  Musculoskeletal:     Cervical back: Neck supple.  Skin:    Findings: No rash.  Neurological:     Mental Status: He is alert and oriented to person, place, and time.     GCS: GCS eye subscore is 4. GCS verbal subscore is 5. GCS motor subscore is 6.  Psychiatric:        Attention and Perception: Attention normal.        Mood and Affect: Mood normal.        Speech: Speech normal.        Behavior: Behavior is cooperative.        Thought Content: Thought content is not paranoid. Thought content includes suicidal ideation. Thought content does not include homicidal ideation.     ED Results / Procedures / Treatments   Labs (all labs ordered are listed, but only abnormal results are displayed) Labs Reviewed  COMPREHENSIVE METABOLIC PANEL - Abnormal; Notable for the following components:      Result Value   Glucose, Bld 103 (*)    ALT 45 (*)    Total Bilirubin 2.1 (*)    All other components within normal limits  SALICYLATE LEVEL - Abnormal; Notable for the following components:   Salicylate Lvl <7.0 (*)    All other components within normal limits  ACETAMINOPHEN LEVEL - Abnormal; Notable for the following components:   Acetaminophen (Tylenol), Serum <10 (*)    All other components within normal limits  RAPID URINE DRUG SCREEN, HOSP PERFORMED - Abnormal; Notable for the following components:   Benzodiazepines POSITIVE (*)    All other components within normal limits  RESPIRATORY PANEL BY RT PCR (FLU A&B, COVID)  ETHANOL  CBC    EKG None  Radiology No results found.  Procedures Procedures (including  critical care time)  Medications Ordered in ED Medications  cloNIDine (CATAPRES) tablet 0.1 mg (0.1 mg Oral Given 04/30/20 1049)    Followed by  cloNIDine (CATAPRES) tablet 0.1 mg (has no administration in time range)    Followed by  cloNIDine (CATAPRES) tablet 0.1 mg (has no administration in time range)  dicyclomine (BENTYL) tablet 20 mg (20 mg Oral Given 04/30/20 1049)  hydrOXYzine (ATARAX/VISTARIL) tablet 25 mg (25 mg Oral Given 04/30/20 1049)  loperamide (IMODIUM) capsule 2-4 mg (has no administration in time range)  methocarbamol (ROBAXIN) tablet  500 mg (500 mg Oral Given 04/30/20 1049)  naproxen (NAPROSYN) tablet 500 mg (500 mg Oral Given 04/30/20 1049)  ondansetron (ZOFRAN-ODT) disintegrating tablet 4 mg (4 mg Oral Given 04/30/20 1050)  acetaminophen (TYLENOL) tablet 650 mg (has no administration in time range)  zolpidem (AMBIEN) tablet 5 mg (has no administration in time range)  ondansetron (ZOFRAN) tablet 4 mg (has no administration in time range)  alum & mag hydroxide-simeth (MAALOX/MYLANTA) 200-200-20 MG/5ML suspension 30 mL (has no administration in time range)  nicotine (NICODERM CQ - dosed in mg/24 hours) patch 21 mg (21 mg Transdermal Patch Applied 04/30/20 1049)  hydrOXYzine (ATARAX/VISTARIL) tablet 25 mg (25 mg Oral Given 04/30/20 6144)    ED Course  I have reviewed the triage vital signs and the nursing notes.  Pertinent labs & imaging results that were available during my care of the patient were reviewed by me and considered in my medical decision making (see chart for details).    MDM Rules/Calculators/A&P                          BP (!) 133/91 (BP Location: Left Arm)   Pulse 80   Temp 99.1 F (37.3 C) (Oral)   Resp 18   SpO2 98%   Final Clinical Impression(s) / ED Diagnoses Final diagnoses:  Polysubstance abuse (HCC)  Suicidal ideation    Rx / DC Orders ED Discharge Orders    None     8:14 AM  Patient here requesting for help with detox from heroin  use.  He also reported vague suicidal ideation if he does receive help.  Also expressed physical symptoms consistent with drug withdrawals.  Patient however is calm and cooperative, vital signs stable.  Attempt to reach out to [facility] without success, will call again. Vistaril given.   8:54 AM Pt is medically cleared, will request TTS for psych eval.  2:29 PM Pt have been psychiatrically cleared and provided appropriate follow up.  Will discharge.     Fayrene Helper, PA-C 04/30/20 1431    [name], MD 04/30/20 [phone]

## 2020-04-30 NOTE — Discharge Instructions (Signed)
For your mental health needs, you are advised to follow up with [facility].  You have an appointment with Toy Cookey, NP scheduled for Monday, May 20, 2020 at 1:00 pm.  For contact information please see OUTPATIENT PROGRAMS below.  To help you maintain a sober lifestyle, a substance abuse treatment program may be beneficial to you.  Contact one of the following facilities at your earliest opportunity to ask about enrolling:  RESIDENTIAL PROGRAMS:       [facility]      [address] [place], [place] [address]      [phone]       [facility]      [address] [place], [place] [address]      [phone]       [facility]      [address] [place]      [place], [place] [address]      [phone]       [facility]      [address] [place], [place] [address]      [phone]       [facility]      [address]      [place], [place] [address]      [phone]  OUTPATIENT PROGRAMS:       [facility]      [address]      [place], [place] [address]      Main number: [phone]      [facility], LCSWA, LCAS: [phone]      They also offer a Chemical Dependency Intensive Outpatient Program.  Contact Shaleta Spease, LCSWA, LCAS to ask about enrolling in the program.

## 2020-04-30 NOTE — ED Notes (Addendum)
Patient received breakfast tray/ gingerale

## 2020-04-30 NOTE — ED Notes (Signed)
Pt discharged home. Discharged instructions read to pt who verbalized understanding. All belongings returned to pt. Denies SI/HI, is not delusional and not responding to internal stimuli. Escorted pt to the ED exit.   

## 2020-04-30 NOTE — ED Notes (Signed)
Patient sleeping/rise and fall of chest breathing observed 

## 2020-04-30 NOTE — ED Notes (Signed)
Gave report to TCU nurse, Diane, RN

## 2020-04-30 NOTE — ED Notes (Signed)
TCU bed ready. Security wanding pt now

## 2020-04-30 NOTE — Patient Outreach (Signed)
ED Peer Support Specialist Patient Intake (Complete at intake & 30-60 Day Follow-up)  Name: Larry Adams  MRN: [id number]  Age: 34 y.o.   Date of Admission: 04/30/2020  Intake: Initial Comments:      Primary Reason Admitted: Detox   Lab values: Alcohol/ETOH: Positive Positive UDS? Drug screen not completed Amphetamines: Drug screen not completed Barbiturates: Drug screen not completed Benzodiazepines: Drug screen not completed Cocaine: Drug screen not completed Opiates: Drug screen not completed Cannabinoids: Drug screen not completed  Demographic information: Gender: Male Ethnicity: White Marital Status: Single Insurance Status: Uninsured/Self-pay Ecologist (Work Neurosurgeon, Physicist, medical, etc.: No Lives with: Alone Living situation:    Reported Patient History: Patient reported health conditions: Depression Patient aware of HIV and hepatitis status: No  In past year, has patient visited ED for any reason? Yes  Number of ED visits: 3  Reason(s) for visit: various reasons  In past year, has patient been hospitalized for any reason? No  Number of hospitalizations:    Reason(s) for hospitalization:    In past year, has patient been arrested? No  Number of arrests:    Reason(s) for arrest:    In past year, has patient been incarcerated? No  Number of incarcerations:    Reason(s) for incarceration:    In past year, has patient received medication-assisted treatment? No  In past year, patient received the following treatments: Other (comment)  In past year, has patient received any harm reduction services?    Did this include any of the following?    In past year, has patient received care from a mental health provider for diagnosis other than SUD? No  In past year, is this first time patient has overdosed? Yes  Number of past overdoses:    In past year, is this first time patient has been hospitalized for an overdose?  Yes  Number of hospitalizations for overdose(s):    Is patient currently receiving treatment for a mental health diagnosis? No  Patient reports experiencing difficulty participating in SUD treatment: Yes    Most important reason(s) for this difficulty? Scheduling issues, Did not seek treatment/was not ready to initiate treatment  Has patient received prior services for treatment? No  In past, patient has received services from following agencies:    Plan of Care:  Suggested follow up at these agencies/treatment centers:  [facility])  Other information:  CPSS met with Pt an was able to complete series of questions an were able to gain information addressing the fact that Pt wants Detox services at this time. CPSS mentioned to Pt that he wants to get clean cause its affecting his body. Pt stated that he does not have any insurance at the time. CPSS faces information to [facility] an are waiting for reply.    Aaron Edelman Shonn Farruggia, CPSS  04/30/2020 11:54 AM

## 2020-04-30 NOTE — ED Triage Notes (Signed)
Pt presents with c/o SI. Pt reports he was here a few weeks ago for detox and was seen at [facility]. Pt reports he is having thoughts of SI because of how bad he has been feeling. Pt is calm and cooperative.

## 2020-04-30 NOTE — ED Notes (Signed)
Attempted to call TCU to check room status. No one answered. Will call again in 5 min

## 2020-05-06 IMAGING — CT CT HEAD WITHOUT CONTRAST
3 of 6 series · 15 of 47 positions shown, 18 images · non-contrast
Comparison: 12/09/2012

CLINICAL DATA: Heroin overdose.

EXAM:
CT HEAD WITHOUT CONTRAST
TECHNIQUE: Contiguous axial images were obtained from the base of the skull
through the vertex without intravenous contrast.

[Series 2: head wo · axial · 0.46mm/px · z∈[-145,-10]mm · 10 of 33 slices shown, 13 images]
[im 3/33  brain]
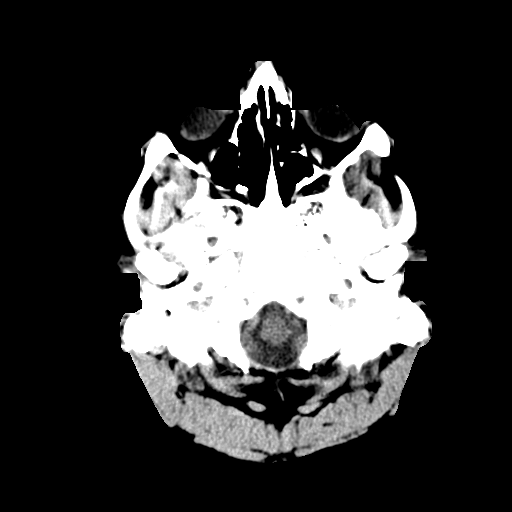
[im 3/33  bone]
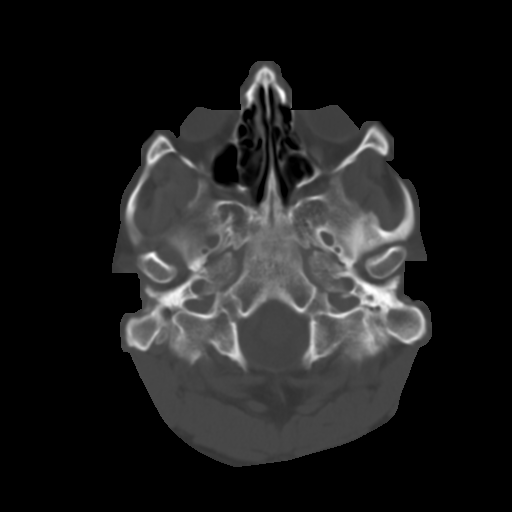
[im 5/33  brain]
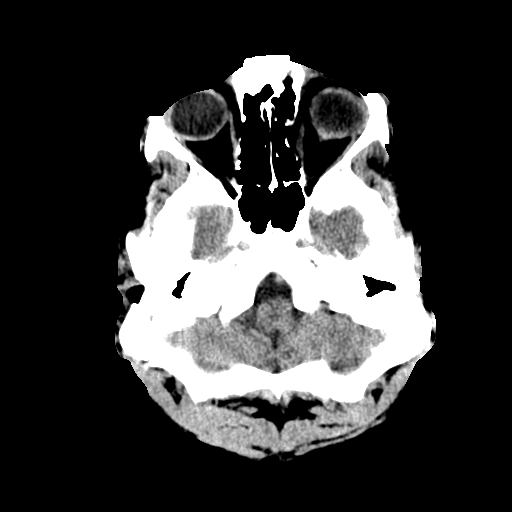
[im 10/33  brain]
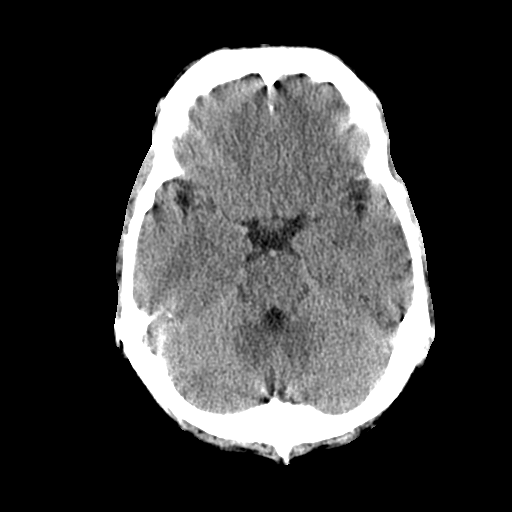
[im 12/33  brain]
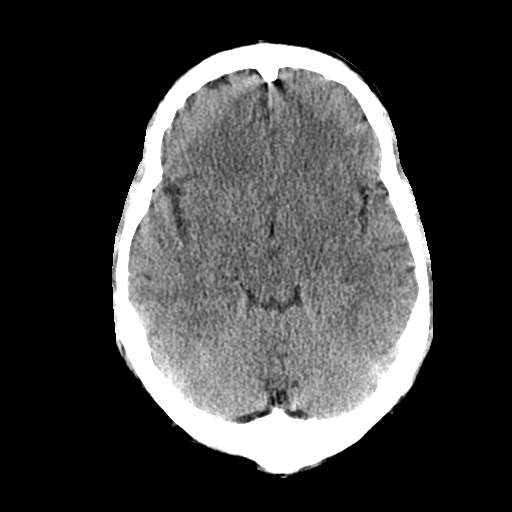
[im 14/33  brain]
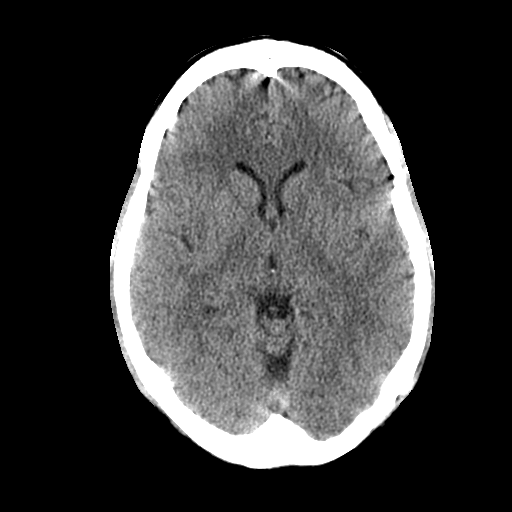
[im 14/33  bone]
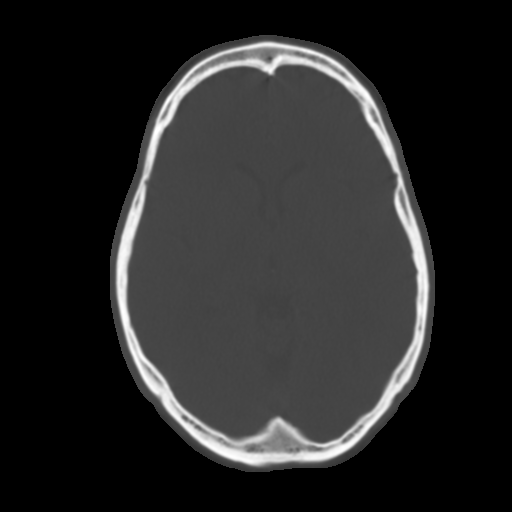
[im 19/33  brain]
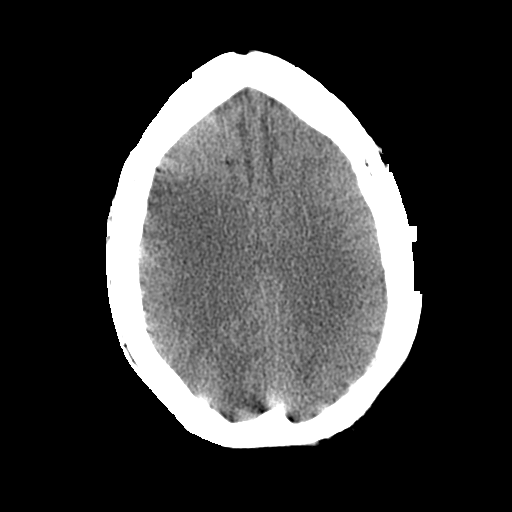
[im 21/33  brain]
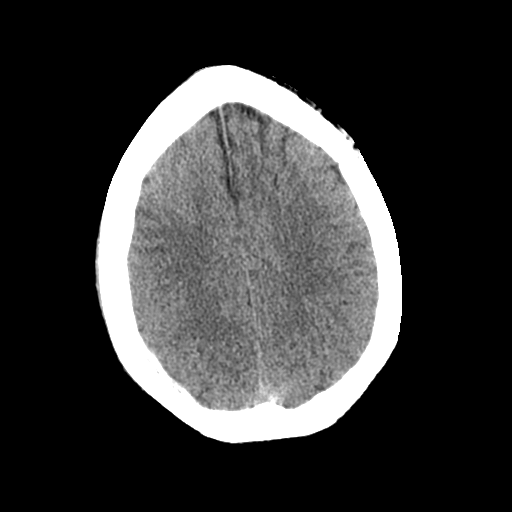
[im 23/33  brain]
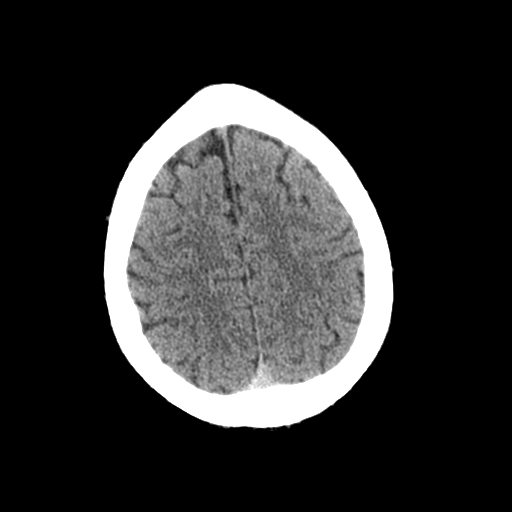
[im 28/33  brain]
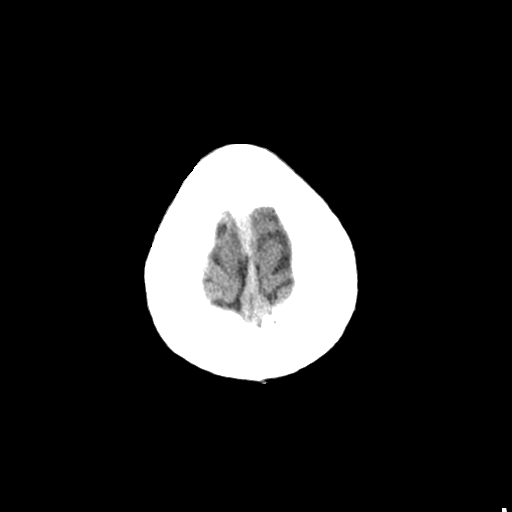
[im 28/33  bone]
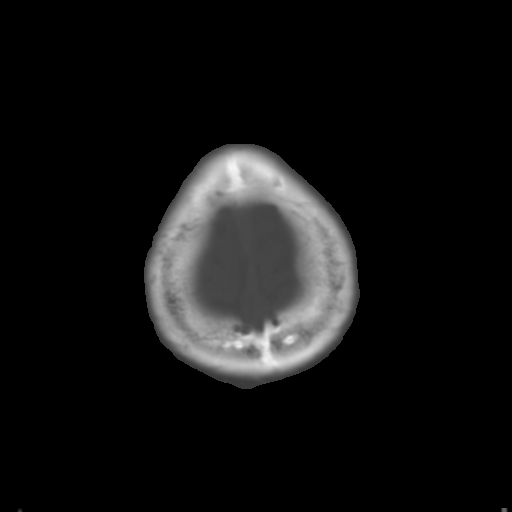
[im 30/33  brain]
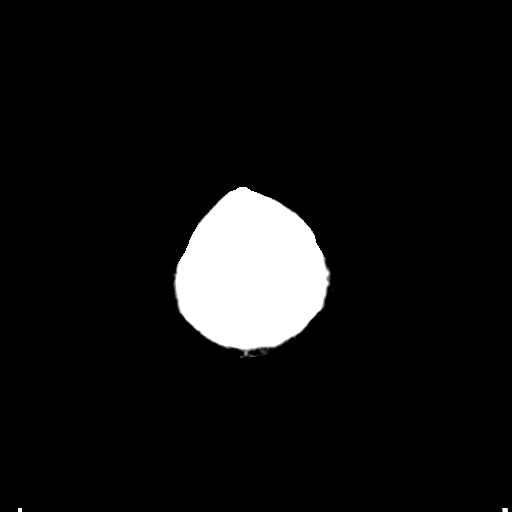

[Series 4: coronal soft tissue · coronal · 0.34mm/px · 3 of 84 slices shown]
[im 21/84  brain]
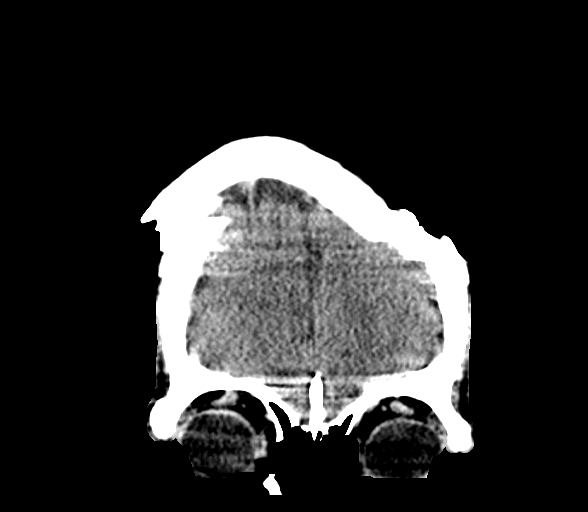
[im 42/84  brain]
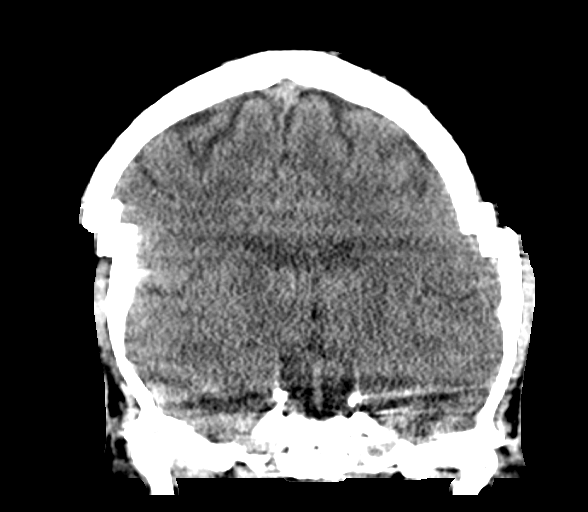
[im 63/84  brain]
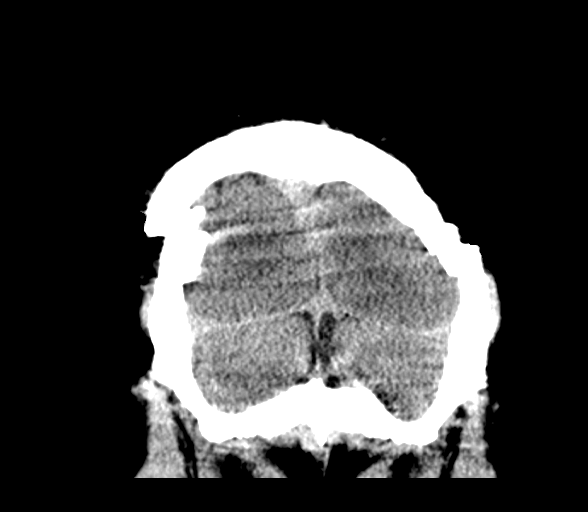

[Series 5: sagittal soft tissue · sagittal · 0.35mm/px · 2 of 62 slices shown]
[im 21/62  brain]
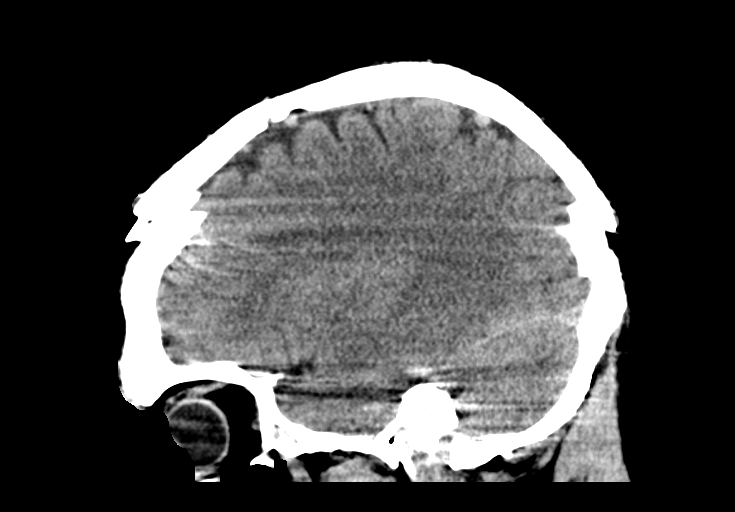
[im 41/62  brain]
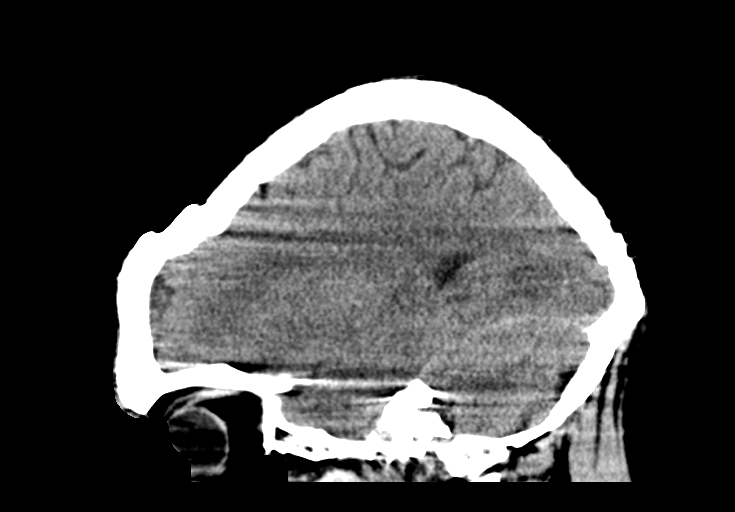

[15 of 47 positions shown; findings below may reference images not displayed]

FINDINGS: Brain: Motion degraded study. There is no evidence for acute
hemorrhage, hydrocephalus, mass lesion, or abnormal extra-axial
fluid collection. No definite CT evidence for acute infarction.

Vascular: No hyperdense vessel or unexpected calcification.

Skull: No evidence for fracture. No worrisome lytic or sclerotic
lesion.

Sinuses/Orbits: The visualized paranasal sinuses and mastoid air
cells are clear. Visualized portions of the globes and intraorbital
fat are unremarkable.

Other: None.
IMPRESSION: Motion degraded study.  No acute intracranial abnormality.

## 2020-05-20 ENCOUNTER — Encounter (HOSPITAL_COMMUNITY): Payer: Self-pay | Admitting: Psychiatry

## 2020-05-20 ENCOUNTER — Encounter (HOSPITAL_COMMUNITY): Payer: No Payment, Other | Admitting: Psychiatry

## 2020-06-29 ENCOUNTER — Encounter (HOSPITAL_COMMUNITY): Payer: Self-pay

## 2020-06-29 ENCOUNTER — Other Ambulatory Visit: Payer: Self-pay

## 2020-06-29 ENCOUNTER — Emergency Department (HOSPITAL_COMMUNITY)
Admission: EM | Admit: 2020-06-29 | Discharge: 2020-06-30 | Disposition: A | Discharge status: Home / Self Care | Payer: Medicaid Other | Attending: Emergency Medicine | Admitting: Emergency Medicine

## 2020-06-29 DIAGNOSIS — F1120 Opioid dependence, uncomplicated: Secondary | ICD-10-CM | POA: Insufficient documentation

## 2020-06-29 DIAGNOSIS — F332 Major depressive disorder, recurrent severe without psychotic features: Secondary | ICD-10-CM | POA: Insufficient documentation

## 2020-06-29 DIAGNOSIS — R45851 Suicidal ideations: Secondary | ICD-10-CM

## 2020-06-29 DIAGNOSIS — F10151 Alcohol abuse with alcohol-induced psychotic disorder with hallucinations: Secondary | ICD-10-CM | POA: Insufficient documentation

## 2020-06-29 DIAGNOSIS — F1010 Alcohol abuse, uncomplicated: Secondary | ICD-10-CM

## 2020-06-29 DIAGNOSIS — Z20822 Contact with and (suspected) exposure to covid-19: Secondary | ICD-10-CM | POA: Insufficient documentation

## 2020-06-29 DIAGNOSIS — F17210 Nicotine dependence, cigarettes, uncomplicated: Secondary | ICD-10-CM | POA: Insufficient documentation

## 2020-06-29 LAB — RAPID URINE DRUG SCREEN, HOSP PERFORMED
Amphetamines: NOT DETECTED
Barbiturates: NOT DETECTED
Benzodiazepines: NOT DETECTED
Cocaine: NOT DETECTED
Opiates: NOT DETECTED
Tetrahydrocannabinol: NOT DETECTED

## 2020-06-29 LAB — COMPREHENSIVE METABOLIC PANEL
ALT: 57 U/L — ABNORMAL HIGH (ref 0–44)
AST: 87 U/L — ABNORMAL HIGH (ref 15–41)
Albumin: 3.7 g/dL (ref 3.5–5.0)
Alkaline Phosphatase: 97 U/L (ref 38–126)
Anion gap: 10 (ref 5–15)
BUN: 6 mg/dL (ref 6–20)
CO2: 23 mmol/L (ref 22–32)
Calcium: 8.5 mg/dL — ABNORMAL LOW (ref 8.9–10.3)
Chloride: 107 mmol/L (ref 98–111)
Creatinine, Ser: 0.76 mg/dL (ref 0.61–1.24)
GFR, Estimated: >60 mL/min (ref >60)
Glucose, Bld: 99 mg/dL (ref 70–99)
Potassium: 3.4 mmol/L — ABNORMAL LOW (ref 3.5–5.1)
Sodium: 140 mmol/L (ref 135–145)
Total Bilirubin: 1.1 mg/dL (ref 0.3–1.2)
Total Protein: 7.4 g/dL (ref 6.5–8.1)

## 2020-06-29 LAB — CBC
HCT: 43.1 % (ref 39.0–52.0)
Hemoglobin: 14.8 g/dL (ref 13.0–17.0)
MCH: 32 pg (ref 26.0–34.0)
MCHC: 34.3 g/dL (ref 30.0–36.0)
MCV: 93.3 fL (ref 80.0–100.0)
Platelets: 193 10*3/uL (ref 150–400)
RBC: 4.62 MIL/uL (ref 4.22–5.81)
RDW: 13.4 % (ref 11.5–15.5)
WBC: 5.4 10*3/uL (ref 4.0–10.5)
nRBC: 0 % (ref 0.0–0.2)

## 2020-06-29 LAB — ACETAMINOPHEN LEVEL: Acetaminophen (Tylenol), Serum: <10 ug/mL — ABNORMAL LOW (ref 10–30)

## 2020-06-29 LAB — SALICYLATE LEVEL: Salicylate Lvl: <7 mg/dL — ABNORMAL LOW (ref 7.0–30.0)

## 2020-06-29 LAB — ETHANOL: Alcohol, Ethyl (B): 297 mg/dL — ABNORMAL HIGH (ref <10)

## 2020-06-29 MED ORDER — ONDANSETRON 8 MG PO TBDP
8.0000 mg | ORAL_TABLET | Freq: Once | ORAL | Status: AC
  Administered 2020-06-29: 8 mg via ORAL
  Filled 2020-06-29: qty 1

## 2020-06-29 MED ORDER — POTASSIUM CHLORIDE CRYS ER 20 MEQ PO TBCR
40.0000 meq | EXTENDED_RELEASE_TABLET | Freq: Once | ORAL | Status: AC
  Administered 2020-06-29: 40 meq via ORAL
  Filled 2020-06-29: qty 2

## 2020-06-29 NOTE — ED Triage Notes (Signed)
Pt BIB [facility]. Pt has been drinking alcohol. Pt called [facility] and asked to be taken to ED. Pt wants help to detox from alcohol. Pt has hx of heroin abuse. Pt stated to [facility] that he is "a little suicidal". Pt is voluntary at this time.

## 2020-06-29 NOTE — ED Provider Notes (Signed)
[facility]-EMERGENCY DEPT Provider Note   CSN: [id number] Arrival date & time: 06/29/20  2011     History Chief Complaint  Patient presents with  . Alcohol Intoxication  . Suicidal    Larry Adams is a 35 y.o. male.  HPI Patient is a 35 year old male with history of drug overdose, mental disorder, polysubstance abuse, severe recurrent major depression without psychotic features, MDD, who presents the emergency department due to SI.  Patient states that "he is tired and cannot go on like this anymore".  He states that he is increasingly suicidal.  Does not currently have a plan.  No homicidal ideation.  States that he feels that he is experiencing visual hallucinations.  He states that "the other night I was seeing a person moving around in front of me that was not there".  He confirms his history of polysubstance abuse.  He states he drinks alcohol throughout the day.  He drank alcohol prior to coming to the emergency department.  He also uses fentanyl daily.  He states he injected fentanyl about 9 hours ago.  Has no physical complaints at this time.  Denies chest pain, shortness of breath, abdominal pain, nausea, vomiting, diarrhea.    Past Medical History:  Diagnosis Date  . Anxiety   . Back pain   . Depression   . Drug overdose   . Mental disorder     Patient Active Problem List   Diagnosis Date Noted  . Acute respiratory failure with hypoxia (HCC) 01/11/2020  . Polysubstance abuse (HCC) 01/11/2020  . Aspiration pneumonia (HCC)   . Severe recurrent major depression without psychotic features (HCC) 11/06/2018  . Alcohol abuse with alcohol-induced mood disorder (HCC) 09/12/2017  . MDD (major depressive disorder), recurrent severe, without psychosis (HCC) 09/12/2017  . Benzodiazepine dependence (HCC) 07/23/2014  . Moderate alcohol use disorder (HCC) 07/23/2014  . Substance induced mood disorder (HCC) 07/23/2014  . Substance-induced anxiety disorder  (HCC) 07/23/2014    Past Surgical History:  Procedure Laterality Date  . HERNIA REPAIR         Family History  Family history unknown: Yes    Social History   Tobacco Use  . Smoking status: Current Every Day Smoker    Packs/day: 1.00    Types: Cigarettes  . Smokeless tobacco: Former Clinical biochemist  . Vaping Use: Former  Substance Use Topics  . Alcohol use: Yes    Alcohol/week: 6.0 standard drinks    Types: 6 Cans of beer per week    Comment: PTA 25 oz of beer; drinking all day everyday for 2 months  . Drug use: Yes    Types: IV, Fentanyl    Comment: Overdose on Heroin; IV Fentanyl use    Home Medications Prior to Admission medications   Medication Sig Start Date End Date Taking? Authorizing Provider  gabapentin (NEURONTIN) 100 MG capsule Take 2 capsules (200 mg total) by mouth 2 (two) times daily. 04/22/20   Antonieta Pert, MD  hydrOXYzine (ATARAX/VISTARIL) 25 MG tablet Take 1 tablet (25 mg total) by mouth every 8 (eight) hours as needed. 04/24/20   Lorre Nick, MD  Multiple Vitamins-Minerals (AIRBORNE GUMMIES PO) Take 3 tablets by mouth daily. Gummies    [provider]  sertraline (ZOLOFT) 50 MG tablet Take 1 tablet (50 mg total) by mouth daily. 04/23/20   Antonieta Pert, MD  traZODone (DESYREL) 50 MG tablet Take 1 tablet (50 mg total) by mouth at bedtime as needed for  sleep. 04/22/20   Sharma Covert, MD    Allergies    Morphine and related  Review of Systems   Review of Systems  All other systems reviewed and are negative. Ten systems reviewed and are negative for acute change, except as noted in the HPI.   Physical Exam Updated Vital Signs BP (!) 140/100 (BP Location: Left Arm)   Pulse (!) 115   Temp 98.8 F (37.1 C) (Oral)   Resp 17   SpO2 98%   Physical Exam Vitals and nursing note reviewed.  Constitutional:      General: He is not in acute distress.    Appearance: Normal appearance. He is not ill-appearing,  toxic-appearing or diaphoretic.  HENT:     Head: Normocephalic and atraumatic.     Right Ear: External ear normal.     Left Ear: External ear normal.     Nose: Nose normal.     Mouth/Throat:     Mouth: Mucous membranes are moist.     Pharynx: Oropharynx is clear. No oropharyngeal exudate or posterior oropharyngeal erythema.  Eyes:     Extraocular Movements: Extraocular movements intact.  Cardiovascular:     Rate and Rhythm: Regular rhythm. Tachycardia present.     Pulses: Normal pulses.     Heart sounds: Normal heart sounds. No murmur heard. No friction rub. No gallop.   Pulmonary:     Effort: Pulmonary effort is normal. No respiratory distress.     Breath sounds: Normal breath sounds. No stridor. No wheezing, rhonchi or rales.  Abdominal:     General: Abdomen is flat.     Tenderness: There is no abdominal tenderness.  Musculoskeletal:        General: Normal range of motion.     Cervical back: Normal range of motion and neck supple. No tenderness.  Skin:    General: Skin is warm and dry.  Neurological:     General: No focal deficit present.     Mental Status: He is alert and oriented to person, place, and time.  Psychiatric:        Mood and Affect: Mood is depressed. Affect is tearful.        Speech: Speech is delayed.        Behavior: Behavior is withdrawn.        Thought Content: Thought content includes suicidal ideation.    ED Results / Procedures / Treatments   Labs (all labs ordered are listed, but only abnormal results are displayed) Labs Reviewed  COMPREHENSIVE METABOLIC PANEL - Abnormal; Notable for the following components:      Result Value   Potassium 3.4 (*)    Calcium 8.5 (*)    AST 87 (*)    ALT 57 (*)    All other components within normal limits  ETHANOL - Abnormal; Notable for the following components:   Alcohol, Ethyl (B) 297 (*)    All other components within normal limits  SALICYLATE LEVEL - Abnormal; Notable for the following components:    Salicylate Lvl <1.6 (*)    All other components within normal limits  ACETAMINOPHEN LEVEL - Abnormal; Notable for the following components:   Acetaminophen (Tylenol), Serum <10 (*)    All other components within normal limits  RESP PANEL BY RT-PCR (FLU A&B, COVID) ARPGX2  CBC  RAPID URINE DRUG SCREEN, HOSP PERFORMED   EKG None  Radiology No results found.  Procedures Procedures (including critical care time)  Medications Ordered in ED Medications  potassium  chloride SA (KLOR-CON) CR tablet 40 mEq (40 mEq Oral Given 06/29/20 2151)  ondansetron (ZOFRAN-ODT) disintegrating tablet 8 mg (8 mg Oral Given 06/29/20 2224)   ED Course  I have reviewed the triage vital signs and the nursing notes.  Pertinent labs & imaging results that were available during my care of the patient were reviewed by me and considered in my medical decision making (see chart for details).  Clinical Course as of 06/30/20 0002  Sat Jun 29, 2020  2130 AST(!): 87 Consistent with prior values as well as his chronic alcohol use. [LJ]  2131 ALT(!): 57 [LJ]  2131 Potassium(!): 3.4 Will give Klor-Con. [LJ]  2135 Initial CIWA score of 4.  We will continue to monitor.  Patient sleeping comfortably in bed. [LJ]  2135 Salicylate Lvl(!): <7.0 [LJ]  2201 Acetaminophen (Tylenol), S(!): <10 [LJ]  2201 Alcohol, Ethyl (B)(!): 297 [LJ]  2201 Anion gap: 10 [LJ]  Sun Jun 30, 2020  0001 SARS Coronavirus 2 by RT PCR: NEGATIVE [LJ]    Clinical Course User Index [LJ] Placido Sou, PA-C   MDM Rules/Calculators/A&P                          Patient is a 35 year old male who presents the emergency department due to suicidal ideation.  Has a history of MDD as well as polysubstance abuse.  States he last drank alcohol prior to walking into the emergency department.  He states he uses fentanyl daily and last used about 9 to 10 hours ago.  Patient appears suicidal on my exam.  He denies any plan.  No homicidal ideation.  Also  reports some auditory hallucinations.  UDS is negative.  CBC without leukocytosis.  CMP shows mild hypokalemia of 3.4 which was repleted with Klor-Con.  Elevated AST and ALT consistent with priors as well as his chronic alcohol use.  Ethanol at 297.  No elevation in acetaminophen or salicylate levels.  No anion gap.  Doubt ketoacidosis at this time.  Patient resting comfortably in bed.  Tachycardia is resolved.  Initial CIWA score of 4.  Doubt acute alcohol withdrawal.  Patient states he last drank alcohol just prior to coming to the emergency department.  Patient has no physical complaints at this time.  Feel that he is medically cleared.  Awaiting TTS consult.  We discussed TTS process in length.  Patient has been nonconfrontational since arrival and is agreeable with the above plan.    I was notified by Melbourne Abts, PA-C that they will accept patient at [facility] pending his Covid test being negative.  Respiratory panel obtained and is negative. EMTALA has been completed.   Final Clinical Impression(s) / ED Diagnoses Final diagnoses:  Alcohol abuse  Suicidal thoughts   Rx / DC Orders ED Discharge Orders    None       Placido Sou, PA-C 06/30/20 0004    Charlynne Pander, MD 06/30/20 [phone]

## 2020-06-29 NOTE — ED Notes (Signed)
Pt given crackers and PO fluids. 
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
## 2020-06-29 NOTE — ED Notes (Signed)
TTS consult bedside.  

## 2020-06-29 NOTE — ED Notes (Signed)
Pt changed out into burgundy scrubs, pt belongings in 2 bags. Patient belonging bags tagged with pts stickers. Belongings are in cabinets desiginated for Margo Aye D patient belongings.
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
## 2020-06-30 ENCOUNTER — Other Ambulatory Visit: Payer: Self-pay

## 2020-06-30 ENCOUNTER — Ambulatory Visit (HOSPITAL_COMMUNITY)
Admission: EM | Admit: 2020-06-30 | Discharge: 2020-06-30 | Disposition: A | Discharge status: Home / Self Care | Payer: No Payment, Other | Attending: Student | Admitting: Student

## 2020-06-30 ENCOUNTER — Encounter (HOSPITAL_COMMUNITY): Payer: Self-pay

## 2020-06-30 DIAGNOSIS — F1020 Alcohol dependence, uncomplicated: Secondary | ICD-10-CM | POA: Insufficient documentation

## 2020-06-30 DIAGNOSIS — Z91419 Personal history of unspecified adult abuse: Secondary | ICD-10-CM | POA: Insufficient documentation

## 2020-06-30 DIAGNOSIS — F332 Major depressive disorder, recurrent severe without psychotic features: Secondary | ICD-10-CM | POA: Insufficient documentation

## 2020-06-30 DIAGNOSIS — F17210 Nicotine dependence, cigarettes, uncomplicated: Secondary | ICD-10-CM | POA: Insufficient documentation

## 2020-06-30 DIAGNOSIS — Z5900 Homelessness unspecified: Secondary | ICD-10-CM | POA: Insufficient documentation

## 2020-06-30 DIAGNOSIS — Z560 Unemployment, unspecified: Secondary | ICD-10-CM | POA: Insufficient documentation

## 2020-06-30 DIAGNOSIS — Z9114 Patient's other noncompliance with medication regimen: Secondary | ICD-10-CM | POA: Insufficient documentation

## 2020-06-30 DIAGNOSIS — Z818 Family history of other mental and behavioral disorders: Secondary | ICD-10-CM | POA: Insufficient documentation

## 2020-06-30 DIAGNOSIS — F1120 Opioid dependence, uncomplicated: Secondary | ICD-10-CM | POA: Insufficient documentation

## 2020-06-30 LAB — RESP PANEL BY RT-PCR (FLU A&B, COVID) ARPGX2
Influenza A by PCR: NEGATIVE
Influenza B by PCR: NEGATIVE
SARS Coronavirus 2 by RT PCR: NEGATIVE

## 2020-06-30 MED ORDER — CLONIDINE HCL 0.1 MG PO TABS: 0.1000 mg | ORAL_TABLET | ORAL | Status: DC

## 2020-06-30 MED ORDER — CLONIDINE HCL 0.1 MG PO TABS
0.1000 mg | ORAL_TABLET | Freq: Every day | ORAL | Status: DC
  Administered 2020-06-30: 0.1 mg via ORAL

## 2020-06-30 MED ORDER — GABAPENTIN 100 MG PO CAPS
200.0000 mg | ORAL_CAPSULE | Freq: Two times a day (BID) | ORAL | Status: DC
  Filled 2020-06-30: qty 56

## 2020-06-30 MED ORDER — THIAMINE HCL 100 MG PO TABS: 100.0000 mg | ORAL_TABLET | Freq: Every day | ORAL | Status: DC

## 2020-06-30 MED ORDER — TRAZODONE HCL 50 MG PO TABS
50.0000 mg | ORAL_TABLET | Freq: Every evening | ORAL | Status: DC | PRN
  Filled 2020-06-30: qty 21

## 2020-06-30 MED ORDER — ACETAMINOPHEN 325 MG PO TABS: 650.0000 mg | ORAL_TABLET | Freq: Four times a day (QID) | ORAL | Status: DC | PRN

## 2020-06-30 MED ORDER — ADULT MULTIVITAMIN W/MINERALS CH
1.0000 | ORAL_TABLET | Freq: Every day | ORAL | Status: DC
  Administered 2020-06-30: 1 via ORAL
  Filled 2020-06-30: qty 1

## 2020-06-30 MED ORDER — SERTRALINE HCL 50 MG PO TABS
50.0000 mg | ORAL_TABLET | Freq: Every day | ORAL | Status: DC
  Administered 2020-06-30: 50 mg via ORAL
  Filled 2020-06-30: qty 21
  Filled 2020-06-30: qty 1

## 2020-06-30 MED ORDER — METHOCARBAMOL 500 MG PO TABS
500.0000 mg | ORAL_TABLET | Freq: Three times a day (TID) | ORAL | Status: DC | PRN
  Administered 2020-06-30: 500 mg via ORAL
  Filled 2020-06-30: qty 1

## 2020-06-30 MED ORDER — ONDANSETRON 4 MG PO TBDP
4.0000 mg | ORAL_TABLET | Freq: Four times a day (QID) | ORAL | Status: DC | PRN
  Administered 2020-06-30: 4 mg via ORAL
  Filled 2020-06-30: qty 1

## 2020-06-30 MED ORDER — ALUM & MAG HYDROXIDE-SIMETH 200-200-20 MG/5ML PO SUSP: 30.0000 mL | ORAL | Status: DC | PRN

## 2020-06-30 MED ORDER — GABAPENTIN 300 MG PO CAPS
300.0000 mg | ORAL_CAPSULE | Freq: Three times a day (TID) | ORAL | Status: DC
  Administered 2020-06-30 (×2): 300 mg via ORAL
  Filled 2020-06-30: qty 63
  Filled 2020-06-30: qty 1

## 2020-06-30 MED ORDER — POTASSIUM CHLORIDE CRYS ER 20 MEQ PO TBCR
20.0000 meq | EXTENDED_RELEASE_TABLET | Freq: Once | ORAL | Status: AC
  Administered 2020-06-30: 20 meq via ORAL
  Filled 2020-06-30: qty 1

## 2020-06-30 MED ORDER — LOPERAMIDE HCL 2 MG PO CAPS: 2.0000 mg | ORAL_CAPSULE | ORAL | Status: DC | PRN

## 2020-06-30 MED ORDER — DICYCLOMINE HCL 20 MG PO TABS: 20.0000 mg | ORAL_TABLET | Freq: Four times a day (QID) | ORAL | Status: DC | PRN

## 2020-06-30 MED ORDER — NAPROXEN 500 MG PO TABS
500.0000 mg | ORAL_TABLET | Freq: Two times a day (BID) | ORAL | Status: DC | PRN
  Administered 2020-06-30: 500 mg via ORAL
  Filled 2020-06-30: qty 1

## 2020-06-30 MED ORDER — CLONIDINE HCL 0.1 MG PO TABS
0.1000 mg | ORAL_TABLET | Freq: Four times a day (QID) | ORAL | Status: DC
  Administered 2020-06-30: 0.1 mg via ORAL
  Filled 2020-06-30 (×2): qty 1

## 2020-06-30 MED ORDER — CHLORDIAZEPOXIDE HCL 25 MG PO CAPS
25.0000 mg | ORAL_CAPSULE | Freq: Four times a day (QID) | ORAL | Status: DC | PRN
  Administered 2020-06-30 (×2): 25 mg via ORAL
  Filled 2020-06-30 (×2): qty 1

## 2020-06-30 MED ORDER — HYDROXYZINE HCL 25 MG PO TABS
25.0000 mg | ORAL_TABLET | Freq: Three times a day (TID) | ORAL | Status: DC | PRN
  Administered 2020-06-30 (×3): 25 mg via ORAL
  Filled 2020-06-30 (×3): qty 1
  Filled 2020-06-30: qty 30

## 2020-06-30 MED ORDER — GABAPENTIN 300 MG PO CAPS
300.0000 mg | ORAL_CAPSULE | Freq: Three times a day (TID) | ORAL | Status: DC
  Filled 2020-06-30: qty 1

## 2020-06-30 MED ORDER — MAGNESIUM HYDROXIDE 400 MG/5ML PO SUSP: 30.0000 mL | Freq: Every day | ORAL | Status: DC | PRN
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
## 2020-06-30 NOTE — ED Provider Notes (Addendum)
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 DETECTED Final  . Barbiturates 04/17/2020 NONE DETECTED  NONE DETECTED Final   Comment: (NOTE) DRUG SCREEN FOR MEDICAL PURPOSES ONLY.  IF CONFIRMATION IS NEEDED FOR ANY PURPOSE, NOTIFY LAB WITHIN 5 DAYS.  LOWEST DETECTABLE LIMITS FOR URINE DRUG SCREEN Drug Class                     Cutoff (ng/mL) Amphetamine and metabolites    1000 Barbiturate and metabolites    200 Benzodiazepine  200 Tricyclics and metabolites     300 Opiates and metabolites        300 Cocaine and metabolites        300 THC                            50 Performed at [facility], [address], [place], [place] [address]   . Alcohol, Ethyl (B) 04/17/2020 205* <10 mg/dL Final   Comment: (NOTE) Lowest detectable limit for serum alcohol is 10 mg/dL.  For medical purposes only. Performed at [facility], [address] [place], [place] [address]   . Acetaminophen (Tylenol), Serum 04/17/2020 <10* 10 - 30 ug/mL Final   Comment: (NOTE) Therapeutic concentrations vary significantly. A range of 10-30 ug/mL  may be an effective concentration for many patients. However, some  are best treated at concentrations outside of this range. Acetaminophen concentrations >150 ug/mL at 4 hours after ingestion  and >50 ug/mL at 12 hours after ingestion are often associated with  toxic reactions.  Performed at [facility], [address] [place], [place] [address]   . Salicylate Lvl 04/17/2020 <7.0* 7.0 - 30.0 mg/dL Final   Performed at [facility], [address], [place], [place] [address]  . Sodium 04/17/2020 140  135 - 145 mmol/L Final  . Potassium 04/17/2020 3.3* 3.5 - 5.1 mmol/L Final  . Chloride 04/17/2020 104  98 - 111 mmol/L Final  . CO2 04/17/2020 24  22 - 32 mmol/L Final  . Glucose, Bld 04/17/2020 99  70 - 99 mg/dL Final   Glucose reference range  applies only to samples taken after fasting for at least 8 hours.  . BUN 04/17/2020 5* 6 - 20 mg/dL Final  . Creatinine, Ser 04/17/2020 0.84  0.61 - 1.24 mg/dL Final  . Calcium 29/56/2130 9.2  8.9 - 10.3 mg/dL Final  . Total Protein 04/17/2020 7.2  6.5 - 8.1 g/dL Final  . Albumin 86/57/8469 3.4* 3.5 - 5.0 g/dL Final  . AST 62/95/2841 52* 15 - 41 U/L Final  . ALT 04/17/2020 29  0 - 44 U/L Final  . Alkaline Phosphatase 04/17/2020 85  38 - 126 U/L Final  . Total Bilirubin 04/17/2020 1.5* 0.3 - 1.2 mg/dL Final  . GFR, Estimated 04/17/2020 >60  >60 mL/min Final  . Anion gap 04/17/2020 12  5 - 15 Final   Performed at [facility], [address] [place], [place] [address]  . SARS Coronavirus 2 by RT PCR 04/17/2020 NEGATIVE  NEGATIVE Final   Comment: (NOTE) SARS-CoV-2 target nucleic acids are NOT DETECTED.  The SARS-CoV-2 RNA is generally detectable in upper respiratoy specimens during the acute phase of infection. The lowest concentration of SARS-CoV-2 viral copies this assay can detect is 131 copies/mL. A negative result does not preclude SARS-Cov-2 infection and should not be used as the sole basis for treatment or other patient management decisions. A negative result may occur with  improper specimen collection/handling, submission of specimen other than nasopharyngeal swab, presence of viral mutation(s) within the areas targeted by this assay, and inadequate number of viral copies (<131 copies/mL). A negative result must be combined with clinical observations, patient history, and epidemiological information. The expected result is Negative.  Fact Sheet for Patients:  https://www.moore.com/  Fact Sheet for Healthcare Providers:  https://www.young.biz/  This test is no  t yet approved or cleared by the [place] and  has been authorized for detection and/or diagnosis of SARS-CoV-2 by FDA  under an Emergency Use Authorization (EUA). This EUA will remain  in effect (meaning this test can be used) for the duration of the COVID-19 declaration under Section 564(b)(1) of the Act, 21 U.S.C. section 360bbb-3(b)(1), unless the authorization is terminated or revoked sooner.    . Influenza A by PCR 04/17/2020 NEGATIVE  NEGATIVE Final  . Influenza B by PCR 04/17/2020 NEGATIVE  NEGATIVE Final   Comment: (NOTE) The Xpert Xpress SARS-CoV-2/FLU/RSV assay is intended as an aid in  the diagnosis of influenza from Nasopharyngeal swab specimens and  should not be used as a sole basis for treatment. Nasal washings and  aspirates are unacceptable for Xpert Xpress SARS-CoV-2/FLU/RSV  testing.  Fact Sheet for Patients: https://www.moore.com/  Fact Sheet for Healthcare Providers: https://www.young.biz/  This test is not yet approved or cleared by the [place] FDA and  has been authorized for detection and/or diagnosis of SARS-CoV-2 by  FDA under an Emergency Use Authorization (EUA). This EUA will remain  in effect (meaning this test can be used) for the duration of the  Covid-19 declaration under Section 564(b)(1) of the Act, 21  U.S.C. section 360bbb-3(b)(1), unless the authorization is  terminated or revoked. Performed at [facility], [address], [place], [place] [address]   Admission on 01/11/2020, Discharged on 01/12/2020  Component Date Value Ref Range Status  . WBC 01/11/2020 10.1  4.0 - 10.5 K/uL Final  . RBC 01/11/2020 4.45  4.22 - 5.81 MIL/uL Final  . Hemoglobin 01/11/2020 14.3  13.0 - 17.0 g/dL Final  . HCT 19/14/7829 41.8  39.0 - 52.0 % Final  . MCV 01/11/2020 93.9  80.0 - 100.0 fL Final  . MCH 01/11/2020 32.1  26.0 - 34.0 pg Final  . MCHC 01/11/2020 34.2  30.0 - 36.0 g/dL Final  . RDW 56/21/3086 12.7  11.5 - 15.5 % Final  . Platelets 01/11/2020 186  150 - 400 K/uL Final  . nRBC 01/11/2020 0.0  0.0 - 0.2 %  Final  . Neutrophils Relative % 01/11/2020 52  % Final  . Neutro Abs 01/11/2020 5.3  1.7 - 7.7 K/uL Final  . Lymphocytes Relative 01/11/2020 26  % Final  . Lymphs Abs 01/11/2020 2.7  0.7 - 4.0 K/uL Final  . Monocytes Relative 01/11/2020 12  % Final  . Monocytes Absolute 01/11/2020 1.2* 0.1 - 1.0 K/uL Final  . Eosinophils Relative 01/11/2020 9  % Final  . Eosinophils Absolute 01/11/2020 0.9* 0.0 - 0.5 K/uL Final  . Basophils Relative 01/11/2020 1  % Final  . Basophils Absolute 01/11/2020 0.1  0.0 - 0.1 K/uL Final  . Immature Granulocytes 01/11/2020 0  % Final  . Abs Immature Granulocytes 01/11/2020 0.04  0.00 - 0.07 K/uL Final   Performed at [facility], [address], [place], [place] [address]  . Sodium 01/11/2020 138  135 - 145 mmol/L Final  . Potassium 01/11/2020 3.1* 3.5 - 5.1 mmol/L Final  . Chloride 01/11/2020 103  98 - 111 mmol/L Final  . CO2 01/11/2020 24  22 - 32 mmol/L Final  . Glucose, Bld 01/11/2020 100* 70 - 99 mg/dL Final   Glucose reference range applies only to samples taken after fasting for at least 8 hours.  . BUN 01/11/2020 10  6 - 20 mg/dL Final  . Creatinine, Ser 01/11/2020 0.95  0.61 - 1.24  mg/dL Final  . Calcium 01/11/2020 8.8* 8.9 - 10.3 mg/dL Final  . GFR calc non Af Amer 01/11/2020 >60  >60 mL/min Final  . GFR calc Af Amer 01/11/2020 >60  >60 mL/min Final  . Anion gap 01/11/2020 11  5 - 15 Final   Performed at [facility], [place] [address] [place], [place] [address]  . SARS Coronavirus 2 01/11/2020 NEGATIVE  NEGATIVE Final   Comment: (NOTE) SARS-CoV-2 target nucleic acids are NOT DETECTED.  The SARS-CoV-2 RNA is generally detectable in upper and lower respiratory specimens during the acute phase of infection. The lowest concentration of SARS-CoV-2 viral copies this assay can detect is 250 copies / mL. A negative result does not preclude SARS-CoV-2 infection and should not be used as the sole basis for treatment or  other patient management decisions.  A negative result may occur with improper specimen collection / handling, submission of specimen other than nasopharyngeal swab, presence of viral mutation(s) within the areas targeted by this assay, and inadequate number of viral copies (<250 copies / mL). A negative result must be combined with clinical observations, patient history, and epidemiological information.  Fact Sheet for Patients:   StrictlyIdeas.no  Fact Sheet for Healthcare Providers: BankingDealers.co.za  This test is not yet approved or                           cleared by the [place] FDA and has been authorized for detection and/or diagnosis of SARS-CoV-2 by FDA under an Emergency Use Authorization (EUA).  This EUA will remain in effect (meaning this test can be used) for the duration of the COVID-19 declaration under Section 564(b)(1) of the Act, 21 U.S.C. section 360bbb-3(b)(1), unless the authorization is terminated or revoked sooner.  Performed at [facility], [place] [address] [place], [place] [address]   . HIV Screen 4th Generation wRfx 01/11/2020 Non Reactive  Non Reactive Final   Performed at [facility], [place] [address], [place], [place] [address]  . Sodium 01/12/2020 136  135 - 145 mmol/L Final  . Potassium 01/12/2020 3.7  3.5 - 5.1 mmol/L Final  . Chloride 01/12/2020 104  98 - 111 mmol/L Final  . CO2 01/12/2020 26  22 - 32 mmol/L Final  . Glucose, Bld 01/12/2020 94  70 - 99 mg/dL Final   Glucose reference range applies only to samples taken after fasting for at least 8 hours.  . BUN 01/12/2020 7  6 - 20 mg/dL Final  . Creatinine, Ser 01/12/2020 0.89  0.61 - 1.24 mg/dL Final  . Calcium 01/12/2020 8.7* 8.9 - 10.3 mg/dL Final  . GFR calc non Af Amer 01/12/2020 >60  >60 mL/min Final  . GFR calc Af Amer 01/12/2020 >60  >60 mL/min Final  . Anion gap 01/12/2020 6  5 - 15 Final   Performed at  [facility], [place] [address], [place], [place] [address]  . WBC 01/12/2020 9.1  4.0 - 10.5 K/uL Final  . RBC 01/12/2020 4.16* 4.22 - 5.81 MIL/uL Final  . Hemoglobin 01/12/2020 13.3  13.0 - 17.0 g/dL Final  . HCT 01/12/2020 39.1  39.0 - 52.0 % Final  . MCV 01/12/2020 94.0  80.0 - 100.0 fL Final  . MCH 01/12/2020 32.0  26.0 - 34.0 pg Final  . MCHC 01/12/2020 34.0  30.0 - 36.0 g/dL Final  . RDW 01/12/2020 13.0  11.5 - 15.5 % Final  .  Platelets 01/12/2020 167  150 - 400 K/uL Final  . nRBC 01/12/2020 0.0  0.0 - 0.2 % Final   Performed at [facility], [address] [place], [place] [address]  . Creatinine, Ser 01/11/2020 1.05  0.61 - 1.24 mg/dL Final  . GFR calc non Af Amer 01/11/2020 >60  >60 mL/min Final  . GFR calc Af Amer 01/11/2020 >60  >60 mL/min Final   Performed at [facility], [address] [place], [place] [address]  . L. pneumophila Serogp 1 Ur Ag 01/12/2020 Negative  Negative Final   Comment: (NOTE) Presumptive negative for L. pneumophila serogroup 1 antigen in urine, suggesting no recent or current infection. Legionnaires' disease cannot be ruled out since other serogroups and species may also cause disease. Performed At: [facility] [address] [place], [place] [id number] Jolene Schimke MD VQ:[id number]   . Source of Sample 01/12/2020 URINE, RANDOM   Final   Performed at [facility], [address] [place], [place] [address]  . Strep Pneumo Urinary Antigen 01/12/2020 NEGATIVE  NEGATIVE Final   Performed at [facility], [address] [place], [place] [address]  . Opiates 01/12/2020 NONE DETECTED  NONE DETECTED Final  . Cocaine 01/12/2020 POSITIVE* NONE DETECTED Final  . Benzodiazepines 01/12/2020 POSITIVE* NONE DETECTED Final  . Amphetamines 01/12/2020 NONE DETECTED  NONE DETECTED Final  . Tetrahydrocannabinol 01/12/2020 NONE DETECTED  NONE DETECTED Final  . Barbiturates  01/12/2020 NONE DETECTED  NONE DETECTED Final   Comment: (NOTE) DRUG SCREEN FOR MEDICAL PURPOSES ONLY.  IF CONFIRMATION IS NEEDED FOR ANY PURPOSE, NOTIFY LAB WITHIN 5 DAYS.  LOWEST DETECTABLE LIMITS FOR URINE DRUG SCREEN Drug Class                     Cutoff (ng/mL) Amphetamine and metabolites    1000 Barbiturate and metabolites    200 Benzodiazepine                 200 Tricyclics and metabolites     300 Opiates and metabolites        300 Cocaine and metabolites        300 THC                            50 Performed at [facility], [address] [place], [place] [address]     Allergies: Morphine and related  PTA Medications: (Not in a hospital admission)   Medical Decision Making  Patient is a 35 y.o. male with history of MDD and polysubstance abuse who presents to the [facility] as a direct admit from [facility] ED. Due to patient's current presentation, his depression appears to be worsening. Patient does not meet inpatient psych treatment criteria. Believe patient would benefit from psychiatry and therapy services, as well as substance abuse treatment. Patient does not appear to be experiencing severe alcohol or opioid withdrawal at this time.    Recommendations  Based on my evaluation the patient does not appear to have an emergency medical condition.  Patient was medically cleared in the ED. Will admit the patient to the [facility] for continuous observation/assessment.  Recommend patient receive resources/be set up with care with [facility] psychiatry and therapy services, as well as substance abuse treatment.  06/29/20 ED Labs Reviewed:   -UDS: + for Benzodiazepines   -CBC: WNL  -Ethanol: 297 mg/dL  -CMP:  Potassium slightly reduced 3.4 mmol/L, Calcium slightly decreased at 8.5 mg/dL, AST elevated at 87 U/L, ALT slightly elevated at 57 U/L. Otherwise unremarkable.  No additional labs needed at this time.   For Alcohol Withdrawal: -Will  initiate Librium detox protocol with Librium 25 mg PO Q6H PRN for withdrawal, CIWA score > 10  -Thiamine Tablet 100 mg PO Daily  -Multivitamin with minerals 1 tablet PO Daily  -Gabapentin 300 mg TID for alcohol withdrawal   For Opioid Withdrawal: -Will initiate COWS -Will initiate Clonidine detox Protocol:   -Clonidine 0.1 mg PO 4 times daily for 10 doses followed by Clonidine 0.1 mg PO BH-q am and hs for 4 doses followed by Clonidine 0.1 mg PO Daily with breakfast for 2 doses   -Naproxen 500 mg PO BID Prn for aching, pain, or discomfort   -Zofran 4 mg PO Q6H PRN for N/V   -Robaxin 500 mg PO Q8H Prn for muscle spasms   -Imodium 2-4 mg PO PRN for diarrhea or loose stools   Will give one-time dose of 20 mEq of Potassium Chloride tablet for mild hypokalemia  Will restart the following home medications:   -Zoloft 50 mg PO Daily for MDD  -Trazodone 50 mg PO QHS PRN for sleep   Jaclyn Shaggy, PA-C 06/30/20  6:02 AM

## 2020-06-30 NOTE — Discharge Instructions (Signed)
Patient is instructed prior to discharge to:  Take all medications as prescribed by his/her mental healthcare provider. Report any adverse effects and or reactions from the medicines to his/her outpatient provider promptly. Keep all scheduled appointments, to ensure that you are getting refills on time and to avoid any interruption in your medication.  If you are unable to keep an appointment call to reschedule.  Be sure to follow-up with resources and follow-up appointments provided.  Patient has been instructed & cautioned: To not engage in alcohol and or illegal drug use while on prescription medicines. In the event of worsening symptoms, patient is instructed to call the crisis hotline, 911 and or go to the nearest ED for appropriate evaluation and treatment of symptoms. To follow-up with his/her primary care provider for your other medical issues, concerns and or health care needs.    Substance abuse resources and Residential Options:  [facility] day residential substance abuse facility (not an option if you have active assault charges). [address], [place], [place] [address] Phone: [phone]: Ask for Shayla in admissions to complete intake if interested in pursuing this option.  [facility]: Can get intake scheduled; (not an option if you have active assault charges). [address] [place], [place] ([phone]) Call Mon-Fri.  [facility]): (offers outpatient therapy and intensive outpatient substance abuse therapy).  [address], [place], [place] [address] Phone: [phone]  [facility]: Offers FREE recovery skills classes, support groups, 1:1 Peer Support, and Compeer Classes. [address], [place], [place] [address] Phone: [phone] (Call to complete intake).   [facility] [address] [place], [place] [address] Phone: [phone] ext [phone]  [facility] provides food,  shelter and other programs and services to the homeless men of [place] through our men's program.  By offering safe shelter, three meals a day, clean clothing, Biblical counseling, financial planning, vocational training, GED/education and employment assistance, we've helped mend the shattered lives of many homeless men since opening in 1974.  We have approximately 267 beds available, with a max of 312 beds including mats for emergency situations and currently house an average of 270 men a night.  Prospective Client Check-In Information Photo ID Required (State/ Out of State/ DOC) - if photo ID is not available, clients are required to have a printout of a police/sheriff's criminal history report. Help out with chores around the [facility]. No sex offender of any type (pending, charged, registered and/or any other sex related offenses) will be permitted to check in. Must be willing to abide by all rules, regulations, and policies established by the [facility]. The following will be provided - shelter, food, clothing, and biblical counseling. If you or someone you know is in need of assistance at our men's shelter in [place], [place], please call [phone] ext. [phone].    Homeless Shelter List:     [facility] ([facility])  [address] [place], [place]  Phone: [phone]     [facility]  [address], [place], [place] [address]  Phone: [phone]     [facility] (Women only)  [address], [place], [place] [address]  Phone: [phone]     [facility]  [address] [place], [place] [address]  Phone: [phone]     [facility]:  [address]  [place], [place] [address]    Phone: [phone]     [facility]  [address], [place], [place] [address]  (Check in at 6:00PM for placement at  a local shelter)  Phone: [phone]  

## 2020-06-30 NOTE — Progress Notes (Signed)
Patient received scheduled medications. Patient states he " feels sick." Patient gait is slow and trembling in hands noticeable. Patient denies SI, HI and AVH this morning. Patient wanting substance abuse treatment.

## 2020-06-30 NOTE — ED Notes (Signed)
Patient arrived by safe transport.  Calm, cooperative.  Skin check completed.  Oriented to unit and unit routine.  Offered sandwich, snacks, and a drink.  Patient denied SI, HI, AVH.  Rated anxiety and depression 10/10.

## 2020-06-30 NOTE — Discharge Summary (Signed)
[name] to be D/C'd Home per MD order.  Discussed with the patient and all questions fully answered.  VSS, Skin clean, dry and intact without evidence of skin break down, no evidence of skin tears noted.   An After Visit Summary was printed and given to the patient. Patient received prescription and sample medications. Patient  Received all belongings from [facility].  D/c education completed with patient/family including follow up instructions, medication list.  Patient instructed to return to ED, call 911, or call NP for any changes in condition.   Patient escorted to [facility] via [employer].  Leamon Arnt 06/30/2020 2:29 PM

## 2020-06-30 NOTE — Care Management (Signed)
Per Shayla at [facility], his referral has been received and will be reviewed.

## 2020-06-30 NOTE — Care Management (Signed)
[facility] has accepted the patient.  [facility] that [facility].  Writer informed the [facility] RN working with the patient and they will arrange transportation.

## 2020-06-30 NOTE — Care Management (Signed)
Per Drema Pry, patient declined due to having an open application for medicaid in [place].

## 2020-06-30 NOTE — ED Notes (Signed)
RN received report from Manfred Arch, Charity fundraiser. Patient currently resting with eyes closed, respiration is even and unlabored at this time. No objective signs of pain or discomfort at this time.  Vital measurements are within normal range. Nursing staff will continue to monitor.

## 2020-06-30 NOTE — ED Notes (Signed)
Locker #27 - 2 white pt belongings bags

## 2020-06-30 NOTE — ED Notes (Addendum)
Pt alert and oriented on the unit. Education, support, and encouragement provided. Discharge summary, medications and follow up appointments reviewed with pt. Suicide prevention resources provided in locker. Pt denies SI/HI, A/VH, or any concerns at this time. Pt ambulatory on and off unit. Pt discharged to lobby.to be transported to [facility] via [employer].

## 2020-06-30 NOTE — ED Notes (Signed)
Patient awake.  Stated he doesn't feel very well.  Vital signs obtained.  CIWA-alcohol scale completed and patient medicated.

## 2020-06-30 NOTE — Care Management (Signed)
Per Cordelia Pen there are open beds at [facility] for deotx.  Writer faxed referral to )[phone])

## 2020-08-08 ENCOUNTER — Encounter (HOSPITAL_COMMUNITY): Payer: Self-pay | Admitting: Emergency Medicine

## 2020-08-08 ENCOUNTER — Emergency Department (HOSPITAL_COMMUNITY)
Admission: EM | Admit: 2020-08-08 | Discharge: 2020-08-09 | Disposition: A | Discharge status: Home / Self Care | Payer: Medicaid Other | Attending: Emergency Medicine | Admitting: Emergency Medicine

## 2020-08-08 ENCOUNTER — Other Ambulatory Visit: Payer: Self-pay

## 2020-08-08 DIAGNOSIS — Y908 Blood alcohol level of 240 mg/100 ml or more: Secondary | ICD-10-CM | POA: Insufficient documentation

## 2020-08-08 DIAGNOSIS — F10920 Alcohol use, unspecified with intoxication, uncomplicated: Secondary | ICD-10-CM

## 2020-08-08 DIAGNOSIS — F419 Anxiety disorder, unspecified: Secondary | ICD-10-CM

## 2020-08-08 DIAGNOSIS — F10129 Alcohol abuse with intoxication, unspecified: Secondary | ICD-10-CM | POA: Insufficient documentation

## 2020-08-08 DIAGNOSIS — E876 Hypokalemia: Secondary | ICD-10-CM

## 2020-08-08 DIAGNOSIS — F17210 Nicotine dependence, cigarettes, uncomplicated: Secondary | ICD-10-CM | POA: Insufficient documentation

## 2020-08-08 DIAGNOSIS — F1020 Alcohol dependence, uncomplicated: Secondary | ICD-10-CM

## 2020-08-08 LAB — COMPREHENSIVE METABOLIC PANEL
ALT: 49 U/L — ABNORMAL HIGH (ref 0–44)
AST: 67 U/L — ABNORMAL HIGH (ref 15–41)
Albumin: 3.5 g/dL (ref 3.5–5.0)
Alkaline Phosphatase: 73 U/L (ref 38–126)
Anion gap: 11 (ref 5–15)
BUN: <5 mg/dL — ABNORMAL LOW (ref 6–20)
CO2: 21 mmol/L — ABNORMAL LOW (ref 22–32)
Calcium: 9 mg/dL (ref 8.9–10.3)
Chloride: 112 mmol/L — ABNORMAL HIGH (ref 98–111)
Creatinine, Ser: 0.85 mg/dL (ref 0.61–1.24)
GFR, Estimated: >60 mL/min (ref >60)
Glucose, Bld: 97 mg/dL (ref 70–99)
Potassium: 3.4 mmol/L — ABNORMAL LOW (ref 3.5–5.1)
Sodium: 144 mmol/L (ref 135–145)
Total Bilirubin: 1.4 mg/dL — ABNORMAL HIGH (ref 0.3–1.2)
Total Protein: 6.8 g/dL (ref 6.5–8.1)

## 2020-08-08 LAB — CBC
HCT: 49 % (ref 39.0–52.0)
Hemoglobin: 17.3 g/dL — ABNORMAL HIGH (ref 13.0–17.0)
MCH: 33 pg (ref 26.0–34.0)
MCHC: 35.3 g/dL (ref 30.0–36.0)
MCV: 93.3 fL (ref 80.0–100.0)
Platelets: 174 10*3/uL (ref 150–400)
RBC: 5.25 MIL/uL (ref 4.22–5.81)
RDW: 13.4 % (ref 11.5–15.5)
WBC: 8.2 10*3/uL (ref 4.0–10.5)
nRBC: 0 % (ref 0.0–0.2)

## 2020-08-08 LAB — SALICYLATE LEVEL: Salicylate Lvl: <7 mg/dL — ABNORMAL LOW (ref 7.0–30.0)

## 2020-08-08 LAB — RAPID URINE DRUG SCREEN, HOSP PERFORMED
Amphetamines: NOT DETECTED
Barbiturates: NOT DETECTED
Benzodiazepines: NOT DETECTED
Cocaine: NOT DETECTED
Opiates: NOT DETECTED
Tetrahydrocannabinol: NOT DETECTED

## 2020-08-08 LAB — ACETAMINOPHEN LEVEL: Acetaminophen (Tylenol), Serum: <10 ug/mL — ABNORMAL LOW (ref 10–30)

## 2020-08-08 LAB — ETHANOL: Alcohol, Ethyl (B): 372 mg/dL (ref <10)

## 2020-08-08 MED ORDER — LORAZEPAM 1 MG PO TABS
1.0000 mg | ORAL_TABLET | Freq: Once | ORAL | Status: AC
  Administered 2020-08-08: 1 mg via ORAL
  Filled 2020-08-08: qty 1

## 2020-08-08 MED ORDER — POTASSIUM CHLORIDE CRYS ER 20 MEQ PO TBCR
40.0000 meq | EXTENDED_RELEASE_TABLET | Freq: Once | ORAL | Status: AC
  Administered 2020-08-08: 40 meq via ORAL
  Filled 2020-08-08: qty 2

## 2020-08-08 MED ORDER — CHLORDIAZEPOXIDE HCL 25 MG PO CAPS: ORAL_CAPSULE | ORAL | 0 refills | Status: DC

## 2020-08-08 NOTE — ED Triage Notes (Signed)
Patient reports suicidal ideation with depression and alcoholism , he did not disclose his plan of suicide at triage , denies hallucinations .

## 2020-08-08 NOTE — Discharge Instructions (Addendum)
It was our pleasure to provide your ER care today - we hope that you feel better.  You may take librium as need if withdrawal symptoms - no driving when drinking alcohol, or when taking librium.   Avoid alcohol use - follow up with AA, and/or use resource guide provided for additional community treatment programs - you may call in AM.  We also made a referral to Peer Support Program.   Follow up with primary care doctor in 1 week.   From today's labs, your potassium level is slightly low (3.4) - eat plenty of fruits and vegetables, and follow up with primary care doctor.   For mental health issues and/or crisis, you may go directly to the [facility]  - it is open 24/7, and walk-ins are welcome.   Return to ER if worse, new symptoms, fevers, new or severe pain, increased trouble breathing, or other concern.

## 2020-08-08 NOTE — ED Provider Notes (Signed)
[facility] EMERGENCY DEPARTMENT Provider Note   CSN: [id number] Arrival date & time: 08/08/20  1844     History Chief Complaint  Patient presents with  . Suicidal    Waseem Suess is a 35 y.o. male.  Patient c/o etoh abuse. 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 States drank heavily earlier this evening. etoh use/abuse is chronic, recurrent, severe, and patient reports worse in past month. Drinks daily. Denies hx alcohol withdrawal seizures or dts, but states he will feel anxious/shaky when stops drinking. Indicates he has plans to f/u with a longer term program in [place], [place], and is looking for short term treatment to bridge that gap. He acknowledges history of depression, but denies acute and/or severe feelings of depression currently. He denies any thoughts of harm to self or others. Has normal appetite. Is able to sleep at night.   The history is provided by the patient.       Past Medical History:  Diagnosis Date  . Anxiety   . Back pain   . Depression   . Drug overdose   . Mental disorder     Patient Active Problem List   Diagnosis Date Noted  . Acute respiratory failure with hypoxia (HCC) 01/11/2020  . Polysubstance abuse (HCC) 01/11/2020  . Aspiration pneumonia (HCC)   . Severe recurrent major depression without psychotic features (HCC) 11/06/2018  . Alcohol abuse with alcohol-induced mood disorder (HCC) 09/12/2017  . MDD (major depressive disorder), recurrent severe, without psychosis (HCC) 09/12/2017  . Benzodiazepine dependence (HCC) 07/23/2014  . Moderate alcohol use disorder (HCC) 07/23/2014  . Substance induced mood disorder (HCC) 07/23/2014  . 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 HERNIA REPAIR         Family History  Family history unknown: Yes    Social History   Tobacco Use  . Smoking status: Current  Every Day Smoker    Packs/day: 1.00    Types: Cigarettes  . Smokeless tobacco: Former Clinical biochemist  . Vaping Use: Former  Substance Use Topics  . Alcohol use: Yes    Alcohol/week: 6.0 standard drinks    Types: 6 Cans of beer per week    Comment: PTA 25 oz of beer; drinking all day everyday for 2 months  . Drug use: Yes    Types: IV, Fentanyl    Comment: Overdose on Heroin; IV Fentanyl use    Home Medications Prior to Admission medications   Medication Sig Start Date End Date Taking? Authorizing Provider  gabapentin (NEURONTIN) 100 MG capsule Take 2 capsules (200 mg total) by mouth 2 (two) times daily. Patient not taking: No sig reported 04/22/20   Antonieta Pert, MD  hydrOXYzine (ATARAX/VISTARIL) 25 MG tablet Take 1 tablet (25 mg total) by mouth every 8 (eight) hours as needed. 04/24/20   Lorre Nick, MD  Multiple Vitamins-Minerals (AIRBORNE GUMMIES PO) Take 3 tablets by mouth daily. Gummies Patient not taking: No sig reported    [provider]  sertraline (ZOLOFT) 50 MG tablet Take 1 tablet (50 mg total) by mouth daily. 04/23/20   Antonieta Pert, MD  traZODone (DESYREL) 50 MG tablet Take 1 tablet (50 mg total) by mouth at bedtime as needed for sleep. 04/22/20   Antonieta Pert, MD    Allergies    Morphine and related  Review of Systems   Review of Systems  Constitutional: Negative for fever.  HENT: Negative for sore throat.   Eyes: Negative for visual disturbance.  Respiratory: Negative for cough and shortness of breath.   Cardiovascular: Negative for chest pain.  Gastrointestinal: Negative for abdominal pain and vomiting.  Genitourinary: Negative for flank pain.  Musculoskeletal: Negative for back pain and neck pain.  Skin: Negative for rash.  Neurological: Negative for headaches.  Hematological: Does not bruise/bleed easily.  Psychiatric/Behavioral: Negative for suicidal ideas. The patient is nervous/anxious.     Physical Exam Updated  Vital Signs BP 112/76 (BP Location: Right Arm)   Pulse 76   Temp 98.7 F (37.1 C) (Oral)   Resp (!) 24   Ht 1.854 m (6\' 1" )   Wt (!) 140 kg   SpO2 98%   BMI 40.72 kg/m   Physical Exam Vitals and nursing note reviewed.  Constitutional:      Appearance: Normal appearance. He is well-developed.  HENT:     Head: Atraumatic.     Nose: Nose normal.     Mouth/Throat:     Mouth: Mucous membranes are moist.     Pharynx: Oropharynx is clear.  Eyes:     General: No scleral icterus.    Conjunctiva/sclera: Conjunctivae normal.     Pupils: Pupils are equal, round, and reactive to light.  Neck:     Trachea: No tracheal deviation.  Cardiovascular:     Rate and Rhythm: Normal rate and regular rhythm.     Pulses: Normal pulses.     Heart sounds: Normal heart sounds. No murmur heard. No friction rub. No gallop.   Pulmonary:     Effort: Pulmonary effort is normal. No accessory muscle usage or respiratory distress.     Breath sounds: Normal breath sounds.  Abdominal:     General: Bowel sounds are normal. There is no distension.     Palpations: Abdomen is soft.     Tenderness: There is no abdominal tenderness.  Genitourinary:    Comments: No cva tenderness. Musculoskeletal:        General: No swelling.     Cervical back: Normal range of motion and neck supple. No rigidity.  Skin:    General: Skin is warm and dry.     Findings: No rash.  Neurological:     Mental Status: He is alert.     Comments: Alert, speech clear. Oriented. No tremor or shakes. Steady gait.   Psychiatric:     Comments: Mildly anxious. Does not appear acutely depressed. No delusions or hallucinations. Does not appear to be responding to internal stimuli. No acute psychosis. No SI.      ED Results / Procedures / Treatments   Labs (all labs ordered are listed, but only abnormal results are displayed) Results for orders placed or performed during the hospital encounter of 08/08/20  Comprehensive metabolic panel   Result Value Ref Range   Sodium 144 135 - 145 mmol/L   Potassium 3.4 (L) 3.5 - 5.1 mmol/L   Chloride 112 (H) 98 - 111 mmol/L   CO2 21 (L) 22 - 32 mmol/L   Glucose, Bld 97 70 - 99 mg/dL   BUN <5 (L) 6 - 20 mg/dL   Creatinine, Ser 10/06/20 0.61 - 1.24 mg/dL   Calcium 9.0 8.9 - 7.93 mg/dL   Total Protein 6.8 6.5 - 8.1 g/dL   Albumin 3.5 3.5 - 5.0 g/dL   AST 67 (H) 15 - 41 U/L   ALT 49 (H) 0 - 44 U/L  Alkaline Phosphatase 73 38 - 126 U/L   Total Bilirubin 1.4 (H) 0.3 - 1.2 mg/dL   GFR, Estimated >23 >76 mL/min   Anion gap 11 5 - 15  Ethanol  Result Value Ref Range   Alcohol, Ethyl (B) 372 (HH) <10 mg/dL  Salicylate level  Result Value Ref Range   Salicylate Lvl <7.0 (L) 7.0 - 30.0 mg/dL  Acetaminophen level  Result Value Ref Range   Acetaminophen (Tylenol), Serum <10 (L) 10 - 30 ug/mL  cbc  Result Value Ref Range   WBC 8.2 4.0 - 10.5 K/uL   RBC 5.25 4.22 - 5.81 MIL/uL   Hemoglobin 17.3 (H) 13.0 - 17.0 g/dL   HCT 28.3 15.1 - 76.1 %   MCV 93.3 80.0 - 100.0 fL   MCH 33.0 26.0 - 34.0 pg   MCHC 35.3 30.0 - 36.0 g/dL   RDW 60.7 37.1 - 06.2 %   Platelets 174 150 - 400 K/uL   nRBC 0.0 0.0 - 0.2 %  Rapid urine drug screen (hospital performed)  Result Value Ref Range   Opiates NONE DETECTED NONE DETECTED   Cocaine NONE DETECTED NONE DETECTED   Benzodiazepines NONE DETECTED NONE DETECTED   Amphetamines NONE DETECTED NONE DETECTED   Tetrahydrocannabinol NONE DETECTED NONE DETECTED   Barbiturates NONE DETECTED NONE DETECTED    EKG None  Radiology No results found.  Procedures Procedures   Medications Ordered in ED Medications - No data to display  ED Course  I have reviewed the triage vital signs and the nursing notes.  Pertinent labs & imaging results that were available during my care of the patient were reviewed by me and considered in my medical decision making (see chart for details).    MDM Rules/Calculators/A&P                         Labs sent.   Reviewed  nursing notes and prior charts for additional history. Hx prior ED visits w etoh/substance use.   Pt chart to have CIWA of '24', however, pts etoh level is very high, not c/w severe etoh withdrawal, appears more c/w etoh intoxication + anxiety.   On my exam, ciwa 2 (mild anxiety) - pt requests med for anxiety.   Additional labs reviewed/interpreted by me - k mildly low. etoh high, 372,  kcl po.   Ativan 1 mg po.   Po fluids, food provided.   Pt is encouraged to pursue sobriety/etoh cessation. Will provide rx librium to help if withdrawal symptoms. Will also provide resource guide for local treatment programs.  Also rec pcp f/u.   Pt currently appears stable for d/c.   Return precautions provided.       Final Clinical Impression(s) / ED Diagnoses Final diagnoses:  None    Rx / DC Orders ED Discharge Orders    None       Cathren Laine, MD 08/08/20 2327

## 2020-08-08 NOTE — ED Notes (Signed)
MD notified of CIWA score. Medication requested.

## 2020-09-10 ENCOUNTER — Emergency Department (HOSPITAL_COMMUNITY)
Admission: EM | Admit: 2020-09-10 | Discharge: 2020-09-11 | Disposition: A | Discharge status: Home / Self Care | Payer: Self-pay | Attending: Emergency Medicine | Admitting: Emergency Medicine

## 2020-09-10 ENCOUNTER — Other Ambulatory Visit: Payer: Self-pay

## 2020-09-10 DIAGNOSIS — F329 Major depressive disorder, single episode, unspecified: Secondary | ICD-10-CM | POA: Insufficient documentation

## 2020-09-10 DIAGNOSIS — F10920 Alcohol use, unspecified with intoxication, uncomplicated: Secondary | ICD-10-CM

## 2020-09-10 DIAGNOSIS — Y908 Blood alcohol level of 240 mg/100 ml or more: Secondary | ICD-10-CM | POA: Insufficient documentation

## 2020-09-10 DIAGNOSIS — F17210 Nicotine dependence, cigarettes, uncomplicated: Secondary | ICD-10-CM | POA: Insufficient documentation

## 2020-09-10 DIAGNOSIS — F10129 Alcohol abuse with intoxication, unspecified: Secondary | ICD-10-CM | POA: Insufficient documentation

## 2020-09-10 DIAGNOSIS — M7989 Other specified soft tissue disorders: Secondary | ICD-10-CM

## 2020-09-10 DIAGNOSIS — F32A Depression, unspecified: Secondary | ICD-10-CM

## 2020-09-10 LAB — COMPREHENSIVE METABOLIC PANEL
ALT: 69 U/L — ABNORMAL HIGH (ref 0–44)
AST: 130 U/L — ABNORMAL HIGH (ref 15–41)
Albumin: 3.6 g/dL (ref 3.5–5.0)
Alkaline Phosphatase: 92 U/L (ref 38–126)
Anion gap: 10 (ref 5–15)
BUN: <5 mg/dL — ABNORMAL LOW (ref 6–20)
CO2: 24 mmol/L (ref 22–32)
Calcium: 8.8 mg/dL — ABNORMAL LOW (ref 8.9–10.3)
Chloride: 113 mmol/L — ABNORMAL HIGH (ref 98–111)
Creatinine, Ser: 1.04 mg/dL (ref 0.61–1.24)
GFR, Estimated: >60 mL/min (ref >60)
Glucose, Bld: 104 mg/dL — ABNORMAL HIGH (ref 70–99)
Potassium: 3.5 mmol/L (ref 3.5–5.1)
Sodium: 147 mmol/L — ABNORMAL HIGH (ref 135–145)
Total Bilirubin: 1.7 mg/dL — ABNORMAL HIGH (ref 0.3–1.2)
Total Protein: 6.9 g/dL (ref 6.5–8.1)

## 2020-09-10 LAB — CBC
HCT: 49.3 % (ref 39.0–52.0)
Hemoglobin: 17.4 g/dL — ABNORMAL HIGH (ref 13.0–17.0)
MCH: 32.8 pg (ref 26.0–34.0)
MCHC: 35.3 g/dL (ref 30.0–36.0)
MCV: 92.8 fL (ref 80.0–100.0)
Platelets: 197 10*3/uL (ref 150–400)
RBC: 5.31 MIL/uL (ref 4.22–5.81)
RDW: 13.7 % (ref 11.5–15.5)
WBC: 6.5 10*3/uL (ref 4.0–10.5)
nRBC: 0 % (ref 0.0–0.2)

## 2020-09-10 LAB — D-DIMER, QUANTITATIVE: D-Dimer, Quant: 1.3 ug/mL-FEU — ABNORMAL HIGH (ref 0.00–0.50)

## 2020-09-10 LAB — ETHANOL: Alcohol, Ethyl (B): 270 mg/dL — ABNORMAL HIGH (ref <10)

## 2020-09-10 NOTE — ED Provider Notes (Signed)
[facility] Note   CSN: [id number] Arrival date & time: 09/10/20  2157     History Chief Complaint  Patient presents with  . Leg Swelling  . Depression  . Alcohol Intoxication    Larry Adams is a 35 y.o. male presenting for evaluation of left leg swelling, depression, alcohol use.  Patient states for the past several days he has had swelling of his left lower leg.  As such, he has been drinking more than normal.  He states he drinks at least 1/5 of liquor a day.  He also reports worsening depression, but denies suicidal thoughts, attempt, or self-harm.  He denies homicidal thoughts, auditory or visual hallucination.  He has a history of chronic alcohol use and chronic anxiety/depression.  He is not currently on medication for this.  He is interested in stopping alcohol, but has not yet followed up with any detox facilities.  He reports history of seizure once in the past when detoxing from alcohol, but no history of DTs and has never been hospitalized for alcohol detox. He denies fall, trauma, or injury of the leg.  He denies pain in the leg.  No erythema, warmth, or fevers.  No symptoms on the right side.  No previous history of blood clot, no recent travel, surgeries, immobilization, history of cancer, or hormone use.  HPI     Past Medical History:  Diagnosis Date  . Anxiety   . Back pain   . Depression   . Drug overdose   . Mental disorder     Patient Active Problem List   Diagnosis Date Noted  . Acute respiratory failure with hypoxia (HCC) 01/11/2020  . Polysubstance abuse (HCC) 01/11/2020  . Aspiration pneumonia (HCC)   . Severe recurrent major depression without psychotic features (HCC) 11/06/2018  . Alcohol abuse with alcohol-induced mood disorder (HCC) 09/12/2017  . MDD (major depressive disorder), recurrent severe, without psychosis (HCC) 09/12/2017  . Benzodiazepine dependence (HCC) 07/23/2014  . Moderate alcohol use  disorder (HCC) 07/23/2014  . Substance induced mood disorder (HCC) 07/23/2014  . 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 HERNIA REPAIR         Family History  Family history unknown: Yes    Social History   Tobacco Use  . Smoking status: Current Every Day Smoker    Packs/day: 1.00    Types: Cigarettes  . Smokeless tobacco: Former Clinical biochemist  . Vaping Use: Former  Substance Use Topics  . Alcohol use: Yes    Alcohol/week: 6.0 standard drinks    Types: 6 Cans of beer per week    Comment: PTA 25 oz of beer; drinking all day everyday for 2 months  . Drug use: Yes    Types: IV, Fentanyl    Comment: Overdose on Heroin; IV Fentanyl use    Home Medications Prior to Admission medications   Medication Sig Start Date End Date Taking? Authorizing Provider  sertraline (ZOLOFT) 50 MG tablet Take 1 tablet (50 mg total) by mouth daily. 04/23/20  Yes Antonieta Pert, MD  chlordiazePOXIDE (LIBRIUM) 25 MG capsule 25 mg PO TID x 1 day, then 25 mg PO BID X 1 day, then 25 mg PO once a day x 1 day 09/11/20   , , PA-C  gabapentin (NEURONTIN) 100 MG capsule Take 2 capsules (200 mg total) by mouth 2 (two) times daily. Patient not taking: No sig reported  04/22/20   Antonieta Pert, MD  hydrOXYzine (ATARAX/VISTARIL) 25 MG tablet Take 1 tablet (25 mg total) by mouth every 8 (eight) hours as needed. Patient not taking: Reported on 09/10/2020 04/24/20   Lorre Nick, MD  Multiple Vitamins-Minerals (AIRBORNE GUMMIES PO) Take 3 tablets by mouth daily. Gummies Patient not taking: No sig reported    [provider]  traZODone (DESYREL) 50 MG tablet Take 1 tablet (50 mg total) by mouth at bedtime as needed for sleep. Patient not taking: Reported on 09/10/2020 04/22/20   Antonieta Pert, MD    Allergies    Morphine and related  Review of Systems   Review of Systems  Cardiovascular: Positive for  leg swelling.  Psychiatric/Behavioral: Positive for dysphoric mood.  All other systems reviewed and are negative.   Physical Exam Updated Vital Signs BP (!) 141/102 (BP Location: Left Arm) Comment: RN notified  Pulse 96   Temp 97.9 F (36.6 C) (Oral)   Resp 18   Ht 6\' 1"  (1.854 m)   Wt 127 kg   SpO2 97%   BMI 36.94 kg/m   Physical Exam Vitals and nursing note reviewed.  Constitutional:      General: He is not in acute distress.    Appearance: He is well-developed.     Comments: Resting in the chair in NAD  HENT:     Head: Normocephalic and atraumatic.  Eyes:     Conjunctiva/sclera: Conjunctivae normal.     Pupils: Pupils are equal, round, and reactive to light.  Cardiovascular:     Rate and Rhythm: Normal rate and regular rhythm.     Pulses: Normal pulses.  Pulmonary:     Effort: Pulmonary effort is normal. No respiratory distress.     Breath sounds: Normal breath sounds. No wheezing.  Abdominal:     General: There is no distension.     Palpations: Abdomen is soft. There is no mass.     Tenderness: There is no abdominal tenderness. There is no guarding or rebound.  Musculoskeletal:        General: Normal range of motion.     Cervical back: Normal range of motion and neck supple.     Right lower leg: No edema.     Left lower leg: Edema present.     Comments: Mild swelling noted of the left lower extremity.  No erythema or warmth.  No tenderness to palpation of the calf.  Negative Homans.  Good distal sensation.  Ambulatory without difficulty.  Skin:    General: Skin is warm and dry.     Capillary Refill: Capillary refill takes less than 2 seconds.  Neurological:     Mental Status: He is alert and oriented to person, place, and time.  Psychiatric:        Attention and Perception: He does not perceive auditory or visual hallucinations.        Mood and Affect: Mood is anxious and depressed.        Thought Content: Thought content does not include homicidal or suicidal  ideation. Thought content does not include homicidal or suicidal plan.     ED Results / Procedures / Treatments   Labs (all labs ordered are listed, but only abnormal results are displayed) Labs Reviewed  D-DIMER, QUANTITATIVE - Abnormal; Notable for the following components:      Result Value   D-Dimer, Quant 1.30 (*)    All other components within normal limits  CBC - Abnormal; Notable for the  following components:   Hemoglobin 17.4 (*)    All other components within normal limits  COMPREHENSIVE METABOLIC PANEL - Abnormal; Notable for the following components:   Sodium 147 (*)    Chloride 113 (*)    Glucose, Bld 104 (*)    BUN <5 (*)    Calcium 8.8 (*)    AST 130 (*)    ALT 69 (*)    Total Bilirubin 1.7 (*)    All other components within normal limits  ETHANOL - Abnormal; Notable for the following components:   Alcohol, Ethyl (B) 270 (*)    All other components within normal limits    EKG None  Radiology No results found.  Procedures Procedures   Medications Ordered in ED Medications - No data to display  ED Course  I have reviewed the triage vital signs and the nursing notes.  Pertinent labs & imaging results that were available during my care of the patient were reviewed by me and considered in my medical decision making (see chart for details).    MDM Rules/Calculators/A&P                          Patient presenting for evaluation of leg swelling.  On exam, patient appears nontoxic.  He is neurovascularly intact.  No erythema or warmth indicate cellulitis.  No tenderness, no history of trauma or injury.  As such, doubt fracture dislocation.  I do not believe he needs acute x-rays.  Consider DVT, although less likely without pain.  However patient is also intoxicated, this could be masking the pain.  Will obtain labs including D-dimer, if negative, he will not need an outpatient ultrasound.  He is not having any chest pain or shortness of breath, doubt  PE.  Unfortunately patient's D-dimer is elevated at 1.3.  As such, he will need outpatient ultrasound, as this is not available at this facility at this time.  Discussed with patient.  Discussed importance of follow-up for this ultrasound.  Discussed that if it is negative, this can continue to be investigated by primary care .    Additionally, patient presenting with request for detox and with continued depression.  His depression is chronic, he is not having any suicidal thoughts.  I do not believe he needs acute/emergent hospitalization for his chronic depression.  I discussed cessation of alcohol, will send in Librium to prevent withdrawal seizure.  I gave resources for detox facilities and placed consult for peer support.     At this time, patient appears safe for discharge.  Return precautions given.  Patient states he understands and agrees to plan.  Final Clinical Impression(s) / ED Diagnoses Final diagnoses:  Left leg swelling  Depression, unspecified depression type  Alcoholic intoxication without complication (HCC)    Rx / DC Orders ED Discharge Orders         Ordered    chlordiazePOXIDE (LIBRIUM) 25 MG capsule        09/11/20 0021    [facility]        09/11/20 0022    Consult to Peer Support       Provider:  (Not yet assigned)   09/11/20 0022           [place], , PA-C 09/11/20 0029    Gilda Crease, MD 09/11/20 [phone]

## 2020-09-10 NOTE — ED Triage Notes (Signed)
Pt c/o LLE swelling x1 week. No c/o pain w/ swelling. Pt c/o depression x1 week. Pt states he drinks fifth daily; has had alcohol withdrawal in the past.

## 2020-09-11 MED ORDER — CHLORDIAZEPOXIDE HCL 25 MG PO CAPS: ORAL_CAPSULE | ORAL | 0 refills | Status: DC

## 2020-09-11 NOTE — Discharge Instructions (Addendum)
Follow the instructions below to get an ultrasound tomorrow to make sure there is no blood clot in your leg. Librium was sent to your pharmacy to decrease shakes and risk of withdrawal seizures.  Do not take this if you are continuing to drink alcohol. There is resources/information in the paperwork about inpatient and outpatient detox facilities, I recommend you follow-up with them for assistance with detox. I have consulted our peer support team who should be reaching out to you to help with your plans for detox. Return to the emergency room with any new, worsening, concerning symptoms.

## 2020-09-12 ENCOUNTER — Ambulatory Visit (HOSPITAL_COMMUNITY): Admission: RE | Admit: 2020-09-12 | Payer: Self-pay | Source: Ambulatory Visit

## 2021-03-24 ENCOUNTER — Inpatient Hospital Stay (HOSPITAL_COMMUNITY)
Admission: EM | Admit: 2021-03-24 | Discharge: 2021-04-01 | DRG: 896 | Disposition: A | Discharge status: Home / Self Care | Payer: Self-pay | Attending: Internal Medicine | Admitting: Internal Medicine

## 2021-03-24 ENCOUNTER — Other Ambulatory Visit: Payer: Self-pay

## 2021-03-24 ENCOUNTER — Encounter (HOSPITAL_COMMUNITY): Payer: Self-pay

## 2021-03-24 ENCOUNTER — Emergency Department (HOSPITAL_COMMUNITY): Payer: Self-pay

## 2021-03-24 DIAGNOSIS — F17210 Nicotine dependence, cigarettes, uncomplicated: Secondary | ICD-10-CM | POA: Diagnosis present

## 2021-03-24 DIAGNOSIS — F1110 Opioid abuse, uncomplicated: Secondary | ICD-10-CM | POA: Diagnosis present

## 2021-03-24 DIAGNOSIS — D6959 Other secondary thrombocytopenia: Secondary | ICD-10-CM | POA: Diagnosis present

## 2021-03-24 DIAGNOSIS — K7689 Other specified diseases of liver: Secondary | ICD-10-CM | POA: Diagnosis present

## 2021-03-24 DIAGNOSIS — F10239 Alcohol dependence with withdrawal, unspecified: Principal | ICD-10-CM | POA: Diagnosis present

## 2021-03-24 DIAGNOSIS — F10232 Alcohol dependence with withdrawal with perceptual disturbance: Secondary | ICD-10-CM

## 2021-03-24 DIAGNOSIS — R030 Elevated blood-pressure reading, without diagnosis of hypertension: Secondary | ICD-10-CM

## 2021-03-24 DIAGNOSIS — R0902 Hypoxemia: Secondary | ICD-10-CM | POA: Diagnosis present

## 2021-03-24 DIAGNOSIS — F10920 Alcohol use, unspecified with intoxication, uncomplicated: Secondary | ICD-10-CM

## 2021-03-24 DIAGNOSIS — R7989 Other specified abnormal findings of blood chemistry: Secondary | ICD-10-CM

## 2021-03-24 DIAGNOSIS — E876 Hypokalemia: Secondary | ICD-10-CM | POA: Diagnosis present

## 2021-03-24 DIAGNOSIS — Y907 Blood alcohol level of 200-239 mg/100 ml: Secondary | ICD-10-CM | POA: Diagnosis present

## 2021-03-24 DIAGNOSIS — F419 Anxiety disorder, unspecified: Secondary | ICD-10-CM | POA: Diagnosis present

## 2021-03-24 DIAGNOSIS — Z5901 Sheltered homelessness: Secondary | ICD-10-CM

## 2021-03-24 DIAGNOSIS — F1020 Alcohol dependence, uncomplicated: Secondary | ICD-10-CM

## 2021-03-24 DIAGNOSIS — F10939 Alcohol use, unspecified with withdrawal, unspecified: Secondary | ICD-10-CM | POA: Diagnosis present

## 2021-03-24 DIAGNOSIS — R45851 Suicidal ideations: Secondary | ICD-10-CM | POA: Diagnosis present

## 2021-03-24 DIAGNOSIS — F1910 Other psychoactive substance abuse, uncomplicated: Secondary | ICD-10-CM | POA: Diagnosis present

## 2021-03-24 DIAGNOSIS — F10932 Alcohol use, unspecified with withdrawal with perceptual disturbance: Secondary | ICD-10-CM

## 2021-03-24 DIAGNOSIS — F1310 Sedative, hypnotic or anxiolytic abuse, uncomplicated: Secondary | ICD-10-CM | POA: Diagnosis present

## 2021-03-24 DIAGNOSIS — F1014 Alcohol abuse with alcohol-induced mood disorder: Secondary | ICD-10-CM

## 2021-03-24 DIAGNOSIS — F332 Major depressive disorder, recurrent severe without psychotic features: Secondary | ICD-10-CM | POA: Diagnosis present

## 2021-03-24 DIAGNOSIS — Z79899 Other long term (current) drug therapy: Secondary | ICD-10-CM

## 2021-03-24 DIAGNOSIS — U071 COVID-19: Secondary | ICD-10-CM | POA: Diagnosis present

## 2021-03-24 LAB — CBC WITH DIFFERENTIAL/PLATELET
Abs Immature Granulocytes: 0.01 10*3/uL (ref 0.00–0.07)
Abs Immature Granulocytes: 0.03 10*3/uL (ref 0.00–0.07)
Basophils Absolute: 0.1 10*3/uL (ref 0.0–0.1)
Basophils Absolute: 0.1 10*3/uL (ref 0.0–0.1)
Basophils Relative: 1 %
Basophils Relative: 1 %
Eosinophils Absolute: 0.2 10*3/uL (ref 0.0–0.5)
Eosinophils Absolute: 0 10*3/uL (ref 0.0–0.5)
Eosinophils Relative: 4 %
Eosinophils Relative: 0 %
HCT: 42.3 % (ref 39.0–52.0)
HCT: 40.6 % (ref 39.0–52.0)
Hemoglobin: 14.6 g/dL (ref 13.0–17.0)
Hemoglobin: 14.1 g/dL (ref 13.0–17.0)
Immature Granulocytes: 0 %
Immature Granulocytes: 0 %
Lymphocytes Relative: 49 %
Lymphocytes Relative: 13 %
Lymphs Abs: 3 10*3/uL (ref 0.7–4.0)
Lymphs Abs: 1.1 10*3/uL (ref 0.7–4.0)
MCH: 32.9 pg (ref 26.0–34.0)
MCH: 32.8 pg (ref 26.0–34.0)
MCHC: 34.5 g/dL (ref 30.0–36.0)
MCHC: 34.7 g/dL (ref 30.0–36.0)
MCV: 95.3 fL (ref 80.0–100.0)
MCV: 94.4 fL (ref 80.0–100.0)
Monocytes Absolute: 0.8 10*3/uL (ref 0.1–1.0)
Monocytes Absolute: 1 10*3/uL (ref 0.1–1.0)
Monocytes Relative: 14 %
Monocytes Relative: 12 %
Neutro Abs: 1.9 10*3/uL (ref 1.7–7.7)
Neutro Abs: 6.5 10*3/uL (ref 1.7–7.7)
Neutrophils Relative %: 32 %
Neutrophils Relative %: 74 %
Platelets: 141 10*3/uL — ABNORMAL LOW (ref 150–400)
Platelets: 129 10*3/uL — ABNORMAL LOW (ref 150–400)
RBC: 4.44 MIL/uL (ref 4.22–5.81)
RBC: 4.3 MIL/uL (ref 4.22–5.81)
RDW: 15.5 % (ref 11.5–15.5)
RDW: 15.1 % (ref 11.5–15.5)
WBC: 6 10*3/uL (ref 4.0–10.5)
WBC: 8.7 10*3/uL (ref 4.0–10.5)
nRBC: 0 % (ref 0.0–0.2)
nRBC: 0 % (ref 0.0–0.2)

## 2021-03-24 LAB — URINALYSIS, ROUTINE W REFLEX MICROSCOPIC
Bilirubin Urine: NEGATIVE
Glucose, UA: NEGATIVE mg/dL
Hgb urine dipstick: NEGATIVE
Ketones, ur: NEGATIVE mg/dL
Leukocytes,Ua: NEGATIVE
Nitrite: NEGATIVE
Protein, ur: NEGATIVE mg/dL
Specific Gravity, Urine: <1.005 — ABNORMAL LOW (ref 1.005–1.030)
pH: 7 (ref 5.0–8.0)

## 2021-03-24 LAB — COMPREHENSIVE METABOLIC PANEL
ALT: 50 U/L — ABNORMAL HIGH (ref 0–44)
ALT: 46 U/L — ABNORMAL HIGH (ref 0–44)
AST: 121 U/L — ABNORMAL HIGH (ref 15–41)
AST: 120 U/L — ABNORMAL HIGH (ref 15–41)
Albumin: 3.1 g/dL — ABNORMAL LOW (ref 3.5–5.0)
Albumin: 2.7 g/dL — ABNORMAL LOW (ref 3.5–5.0)
Alkaline Phosphatase: 110 U/L (ref 38–126)
Alkaline Phosphatase: 100 U/L (ref 38–126)
Anion gap: 10 (ref 5–15)
Anion gap: 8 (ref 5–15)
BUN: <5 mg/dL — ABNORMAL LOW (ref 6–20)
BUN: <5 mg/dL — ABNORMAL LOW (ref 6–20)
CO2: 25 mmol/L (ref 22–32)
CO2: 25 mmol/L (ref 22–32)
Calcium: 8.5 mg/dL — ABNORMAL LOW (ref 8.9–10.3)
Calcium: 8.5 mg/dL — ABNORMAL LOW (ref 8.9–10.3)
Chloride: 102 mmol/L (ref 98–111)
Chloride: 105 mmol/L (ref 98–111)
Creatinine, Ser: 0.67 mg/dL (ref 0.61–1.24)
Creatinine, Ser: 0.79 mg/dL (ref 0.61–1.24)
GFR, Estimated: >60 mL/min (ref >60)
GFR, Estimated: >60 mL/min (ref >60)
Glucose, Bld: 102 mg/dL — ABNORMAL HIGH (ref 70–99)
Glucose, Bld: 103 mg/dL — ABNORMAL HIGH (ref 70–99)
Potassium: 3.1 mmol/L — ABNORMAL LOW (ref 3.5–5.1)
Potassium: 3.8 mmol/L (ref 3.5–5.1)
Sodium: 137 mmol/L (ref 135–145)
Sodium: 138 mmol/L (ref 135–145)
Total Bilirubin: 1.9 mg/dL — ABNORMAL HIGH (ref 0.3–1.2)
Total Bilirubin: 2.1 mg/dL — ABNORMAL HIGH (ref 0.3–1.2)
Total Protein: 7.1 g/dL (ref 6.5–8.1)
Total Protein: 6.6 g/dL (ref 6.5–8.1)

## 2021-03-24 LAB — RESP PANEL BY RT-PCR (FLU A&B, COVID) ARPGX2
Influenza A by PCR: NEGATIVE
Influenza A by PCR: NEGATIVE
Influenza B by PCR: NEGATIVE
Influenza B by PCR: NEGATIVE
SARS Coronavirus 2 by RT PCR: NEGATIVE
SARS Coronavirus 2 by RT PCR: POSITIVE — AB

## 2021-03-24 LAB — RAPID URINE DRUG SCREEN, HOSP PERFORMED
Amphetamines: NOT DETECTED
Barbiturates: NOT DETECTED
Benzodiazepines: NOT DETECTED
Cocaine: NOT DETECTED
Opiates: POSITIVE — AB
Tetrahydrocannabinol: NOT DETECTED

## 2021-03-24 LAB — ETHANOL
Alcohol, Ethyl (B): 213 mg/dL — ABNORMAL HIGH (ref <10)
Alcohol, Ethyl (B): <10 mg/dL (ref <10)

## 2021-03-24 LAB — ACETAMINOPHEN LEVEL: Acetaminophen (Tylenol), Serum: <10 ug/mL — ABNORMAL LOW (ref 10–30)

## 2021-03-24 LAB — MRSA NEXT GEN BY PCR, NASAL: MRSA by PCR Next Gen: NOT DETECTED

## 2021-03-24 LAB — SALICYLATE LEVEL: Salicylate Lvl: <7 mg/dL — ABNORMAL LOW (ref 7.0–30.0)

## 2021-03-24 LAB — BRAIN NATRIURETIC PEPTIDE: B Natriuretic Peptide: 98.3 pg/mL (ref 0.0–100.0)

## 2021-03-24 MED ORDER — CHLORDIAZEPOXIDE HCL 25 MG PO CAPS: ORAL_CAPSULE | ORAL | 0 refills | Status: DC

## 2021-03-24 MED ORDER — ONDANSETRON 4 MG PO TBDP
8.0000 mg | ORAL_TABLET | Freq: Once | ORAL | Status: DC
  Filled 2021-03-24: qty 1

## 2021-03-24 MED ORDER — THIAMINE HCL 100 MG PO TABS
100.0000 mg | ORAL_TABLET | Freq: Every day | ORAL | Status: DC
Start: 2021-03-24 — End: 2021-04-01
  Administered 2021-03-24 – 2021-04-01 (×9): 100 mg via ORAL
  Filled 2021-03-24 (×10): qty 1

## 2021-03-24 MED ORDER — FOLIC ACID 1 MG PO TABS
1.0000 mg | ORAL_TABLET | Freq: Once | ORAL | Status: AC
  Administered 2021-03-24: 1 mg via ORAL
  Filled 2021-03-24: qty 1

## 2021-03-24 MED ORDER — SODIUM CHLORIDE 0.9 % IV BOLUS
500.0000 mL | Freq: Once | INTRAVENOUS | Status: AC
  Administered 2021-03-24: 500 mL via INTRAVENOUS

## 2021-03-24 MED ORDER — LORAZEPAM 1 MG PO TABS
0.0000 mg | ORAL_TABLET | Freq: Four times a day (QID) | ORAL | Status: DC
  Administered 2021-03-24: 1 mg via ORAL
  Filled 2021-03-24 (×2): qty 1

## 2021-03-24 MED ORDER — NICOTINE 21 MG/24HR TD PT24
21.0000 mg | MEDICATED_PATCH | Freq: Every day | TRANSDERMAL | Status: DC
Start: 2021-03-24 — End: 2021-04-01
  Administered 2021-03-24 – 2021-03-31 (×8): 21 mg via TRANSDERMAL
  Filled 2021-03-24 (×9): qty 1

## 2021-03-24 MED ORDER — ADULT MULTIVITAMIN W/MINERALS CH
1.0000 | ORAL_TABLET | Freq: Every day | ORAL | Status: DC
  Administered 2021-03-24 – 2021-04-01 (×9): 1 via ORAL
  Filled 2021-03-24 (×9): qty 1

## 2021-03-24 MED ORDER — GABAPENTIN 100 MG PO CAPS: 200.0000 mg | ORAL_CAPSULE | Freq: Three times a day (TID) | ORAL | Status: DC

## 2021-03-24 MED ORDER — ACETAMINOPHEN 325 MG PO TABS
650.0000 mg | ORAL_TABLET | Freq: Once | ORAL | Status: AC
  Administered 2021-03-24: 650 mg via ORAL
  Filled 2021-03-24: qty 2

## 2021-03-24 MED ORDER — SODIUM CHLORIDE 0.9 % IV BOLUS
1000.0000 mL | Freq: Once | INTRAVENOUS | Status: AC
  Administered 2021-03-24: 1000 mL via INTRAVENOUS

## 2021-03-24 MED ORDER — IBUPROFEN 200 MG PO TABS
600.0000 mg | ORAL_TABLET | Freq: Three times a day (TID) | ORAL | Status: DC | PRN
  Administered 2021-03-24 – 2021-03-30 (×7): 600 mg via ORAL
  Filled 2021-03-24 (×10): qty 3

## 2021-03-24 MED ORDER — LORAZEPAM 2 MG/ML IJ SOLN
2.0000 mg | Freq: Once | INTRAMUSCULAR | Status: AC
  Administered 2021-03-24: 2 mg via INTRAVENOUS
  Filled 2021-03-24: qty 1

## 2021-03-24 MED ORDER — LORAZEPAM 1 MG PO TABS: 0.0000 mg | ORAL_TABLET | Freq: Two times a day (BID) | ORAL | Status: DC

## 2021-03-24 MED ORDER — LORAZEPAM 1 MG PO TABS
1.0000 mg | ORAL_TABLET | ORAL | Status: AC | PRN
  Administered 2021-03-24 – 2021-03-27 (×16): 1 mg via ORAL
  Filled 2021-03-24 (×16): qty 1

## 2021-03-24 MED ORDER — GABAPENTIN 300 MG PO CAPS
300.0000 mg | ORAL_CAPSULE | Freq: Three times a day (TID) | ORAL | Status: DC
  Administered 2021-03-24: 300 mg via ORAL
  Filled 2021-03-24: qty 1

## 2021-03-24 MED ORDER — LORAZEPAM 2 MG/ML IJ SOLN
1.0000 mg | INTRAMUSCULAR | Status: AC | PRN
  Administered 2021-03-24: 3 mg via INTRAVENOUS
  Filled 2021-03-24: qty 2

## 2021-03-24 MED ORDER — LORAZEPAM 1 MG PO TABS
1.0000 mg | ORAL_TABLET | Freq: Once | ORAL | Status: DC
Start: 2021-03-24 — End: 2021-04-01
  Filled 2021-03-24 (×2): qty 1

## 2021-03-24 MED ORDER — ONDANSETRON HCL 4 MG PO TABS
4.0000 mg | ORAL_TABLET | Freq: Three times a day (TID) | ORAL | Status: DC | PRN
  Administered 2021-03-24 (×2): 4 mg via ORAL
  Filled 2021-03-24 (×2): qty 1

## 2021-03-24 MED ORDER — FOLIC ACID 1 MG PO TABS
1.0000 mg | ORAL_TABLET | Freq: Every day | ORAL | Status: DC
  Administered 2021-03-24 – 2021-04-01 (×9): 1 mg via ORAL
  Filled 2021-03-24 (×9): qty 1

## 2021-03-24 MED ORDER — ENOXAPARIN SODIUM 60 MG/0.6ML IJ SOSY
60.0000 mg | PREFILLED_SYRINGE | INTRAMUSCULAR | Status: DC
  Administered 2021-03-24 – 2021-03-31 (×8): 60 mg via SUBCUTANEOUS
  Filled 2021-03-24 (×8): qty 0.6

## 2021-03-24 MED ORDER — CHLORHEXIDINE GLUCONATE CLOTH 2 % EX PADS
6.0000 | MEDICATED_PAD | Freq: Every day | CUTANEOUS | Status: DC
  Administered 2021-03-24 – 2021-03-29 (×3): 6 via TOPICAL

## 2021-03-24 NOTE — ED Notes (Signed)
Pt has been counseled multiple times to please stop detaching himself from the cardiac monitor and coming to the exam room door. Explained to pt the importance of staying attached to the cardiac monitor.

## 2021-03-24 NOTE — ED Provider Notes (Signed)
[facility]-EMERGENCY DEPT Provider Note   CSN: [id number] Arrival date & time: 03/24/21  0108     History Chief Complaint  Patient presents with   Suicidal   substance abuse    Jarman Litton is a 35 y.o. male.  35 year old male with a history of MDD, substance-induced mood disorder, alcohol abuse, polysubstance abuse presents to the emergency department for suicidal ideations.  He reports being sober from drugs and alcohol for 8 months, but resumed use recently.  States that he has been trying to "drink myself to death" and has been using heroin daily; has consumed 8-10 16-ounce beers per day.  Reports that he does not want to live anymore, but chose to come to the ED to try and get help tonight.  He relocated back to [facility] 1.5 weeks ago.  Was previously in [facility].  Has been homeless for the past 3 years.  Denies any homicidal ideations, hallucinations.  Last heroin use was at 2030 tonight.  The history is provided by the patient. No language interpreter was used.      Past Medical History:  Diagnosis Date   Anxiety    Back pain    Depression    Drug overdose    Mental disorder     Patient Active Problem List   Diagnosis Date Noted   Acute respiratory failure with hypoxia (HCC) 01/11/2020   Polysubstance abuse (HCC) 01/11/2020   Aspiration pneumonia (HCC)    Severe recurrent major depression without psychotic features (HCC) 11/06/2018   Alcohol abuse with alcohol-induced mood disorder (HCC) 09/12/2017   MDD (major depressive disorder), recurrent severe, without psychosis (HCC) 09/12/2017   Benzodiazepine dependence (HCC) 07/23/2014   Moderate alcohol use disorder (HCC) 07/23/2014   Substance induced mood disorder (HCC) 07/23/2014   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    Past Surgical History:  Procedure Laterality Date   HERNIA REPAIR         Family History  Family history unknown: Yes    Social History    Tobacco Use   Smoking status: Every Day    Packs/day: 1.00    Types: Cigarettes   Smokeless tobacco: Former  Building services engineer Use: Former  Substance Use Topics   Alcohol use: Yes    Alcohol/week: 6.0 standard drinks    Types: 6 Cans of beer per week    Comment: PTA 25 oz of beer; drinking all day everyday for 2 months   Drug use: Yes    Types: IV, Fentanyl    Comment: Overdose on Heroin; IV Fentanyl use    Home Medications Prior to Admission medications   Medication Sig Start Date End Date Taking? Authorizing Provider  chlordiazePOXIDE (LIBRIUM) 25 MG capsule 25 mg PO TID x 1 day, then 25 mg PO BID X 1 day, then 25 mg PO once a day x 1 day Patient not taking: No sig reported 09/11/20   Caccavale, Sophia, PA-C  gabapentin (NEURONTIN) 100 MG capsule Take 2 capsules (200 mg total) by mouth 2 (two) times daily. Patient not taking: No sig reported 04/22/20   Antonieta Pert, MD  hydrOXYzine (ATARAX/VISTARIL) 25 MG tablet Take 1 tablet (25 mg total) by mouth every 8 (eight) hours as needed. Patient not taking: No sig reported 04/24/20   Lorre Nick, MD  Multiple Vitamins-Minerals (AIRBORNE GUMMIES PO) Take 3 tablets by mouth daily. Gummies Patient not taking: No sig reported    [provider]  sertraline (ZOLOFT) 50 MG  tablet Take 1 tablet (50 mg total) by mouth daily. Patient not taking: No sig reported 04/23/20   Antonieta Pert, MD  traZODone (DESYREL) 50 MG tablet Take 1 tablet (50 mg total) by mouth at bedtime as needed for sleep. Patient not taking: No sig reported 04/22/20   Antonieta Pert, MD    Allergies    Morphine and related and Vancomycin  Review of Systems   Review of Systems Ten systems reviewed and are negative for acute change, except as noted in the HPI.    Physical Exam Updated Vital Signs BP 136/86   Pulse (!) 103   Temp 98 F (36.7 C) (Oral)   Resp 20   Ht 6\' 1"  (1.854 m)   Wt 127 kg   SpO2 100%   BMI 36.94 kg/m    Physical Exam Vitals and nursing note reviewed.  Constitutional:      General: He is not in acute distress.    Appearance: He is well-developed. He is not diaphoretic.     Comments: Disheveled. Foul smelling. Tanned skin.  HENT:     Head: Normocephalic and atraumatic.  Eyes:     General: No scleral icterus.    Conjunctiva/sclera: Conjunctivae normal.  Pulmonary:     Effort: Pulmonary effort is normal. No respiratory distress.     Comments: Respirations even and unlabored Musculoskeletal:        General: Normal range of motion.     Cervical back: Normal range of motion.  Skin:    General: Skin is warm and dry.     Coloration: Skin is not pale.     Findings: No erythema or rash.  Neurological:     Mental Status: He is alert and oriented to person, place, and time.  Psychiatric:        Attention and Perception: Attention normal.        Mood and Affect: Mood is depressed.        Speech: Speech normal.        Behavior: Behavior is cooperative.        Thought Content: Thought content includes suicidal ideation. Thought content does not include homicidal ideation.    ED Results / Procedures / Treatments   Labs (all labs ordered are listed, but only abnormal results are displayed) Labs Reviewed  CBC WITH DIFFERENTIAL/PLATELET - Abnormal; Notable for the following components:      Result Value   Platelets 141 (*)    All other components within normal limits  COMPREHENSIVE METABOLIC PANEL - Abnormal; Notable for the following components:   Potassium 3.1 (*)    Glucose, Bld 102 (*)    BUN <5 (*)    Calcium 8.5 (*)    Albumin 3.1 (*)    AST 121 (*)    ALT 50 (*)    Total Bilirubin 1.9 (*)    All other components within normal limits  ETHANOL - Abnormal; Notable for the following components:   Alcohol, Ethyl (B) 213 (*)    All other components within normal limits  ACETAMINOPHEN LEVEL - Abnormal; Notable for the following components:   Acetaminophen (Tylenol), Serum <10  (*)    All other components within normal limits  SALICYLATE LEVEL - Abnormal; Notable for the following components:   Salicylate Lvl <7.0 (*)    All other components within normal limits  URINALYSIS, ROUTINE W REFLEX MICROSCOPIC - Abnormal; Notable for the following components:   Specific Gravity, Urine <1.005 (*)    All  other components within normal limits  RAPID URINE DRUG SCREEN, HOSP PERFORMED - Abnormal; Notable for the following components:   Opiates POSITIVE (*)    All other components within normal limits  RESP PANEL BY RT-PCR (FLU A&B, COVID) ARPGX2    EKG None  Radiology No results found.  Procedures Procedures   Medications Ordered in ED Medications  ibuprofen (ADVIL) tablet 600 mg (has no administration in time range)  ondansetron (ZOFRAN) tablet 4 mg (has no administration in time range)  nicotine (NICODERM CQ - dosed in mg/24 hours) patch 21 mg (has no administration in time range)  LORazepam (ATIVAN) tablet 0-4 mg (1 mg Oral Given 03/24/21 0345)  LORazepam (ATIVAN) tablet 0-4 mg (has no administration in time range)  thiamine tablet 100 mg (has no administration in time range)    ED Course  I have reviewed the triage vital signs and the nursing notes.  Pertinent labs & imaging results that were available during my care of the patient were reviewed by me and considered in my medical decision making (see chart for details).    MDM Rules/Calculators/A&P                           35 year old male presents to the emergency department for depression and suicidal ideations.  He is homeless.  Noted to have elevated ethanol level as well as UDS positive for opiates.  This is consistent with his stated history.  Pending TTS assessment for further recommendations.  Disposition to be determined by oncoming ED provider.   Final Clinical Impression(s) / ED Diagnoses Final diagnoses:  Suicidal ideation  Polysubstance abuse [facility])    Rx / DC Orders ED Discharge  Orders     None        Antony Madura, PA-C 03/24/21 1517    Geoffery Lyons, MD 03/25/21 [phone]

## 2021-03-24 NOTE — H&P (Addendum)
History and Physical    Larry Adams PZW:[id number] DOB: 10/05/1985 DOA: 03/24/2021  PCP: Patient, No Pcp Per (Inactive)  Patient coming from: Home  I have personally briefly reviewed patient's old medical records in [facility]  Chief Complaint: Polysubstance abuse and hopelessness  HPI: Larry Adams is a 35 y.o. male with medical history significant for polysubstance abuse (heroin, alcohol, benzo) and depression who reports that his crisis counselor dropped him off in the ED last night since he was having a feeling of hopelessness and was drinking and using IV heroin.  States he used about 5-6 beer.  He denies any suicidal ideation or hallucination.  He was initially cleared by psychiatry for discharge.  However, he later became febrile up to 102.9, tachycardic and was noted by ED physician to be tremulous with tactile hallucination so hospitalist was called for admission for alcohol withdrawal.  On my evaluation, he was tachycardic up to 120s and hypertensive up to systolic of 173.  However he was sitting comfortably eating dinner.  Denies any hallucinations.  No tremors.  CBC without leukocytosis or anemia.  Platelet of 141.  Sodium of 137, potassium of 3.1, BG of 102.  AST of 121, ALT 50, total bilirubin 1.9. Tylenol and salicylate level were negative. His initial alcohol level at presentation last evening of 213 and currently is less than 10. UDS positive for opioids  Chest x-ray negative.  COVID and flu PCR negative.  He reports having alcohol withdrawal seizures about a year ago.  Review of Systems: Constitutional: No Weight Change, No Fever ENT/Mouth: No sore throat, No Rhinorrhea Eyes: No Eye Pain, No Vision Changes Cardiovascular: No Chest Pain, no SOB, endorse LE edema Respiratory: No Cough, No Sputum  Gastrointestinal: No Nausea, No Vomiting, No Diarrhea, No Constipation, No Pain Genitourinary: no Urinary Incontinence Musculoskeletal: No Arthralgias, No  Myalgias Skin: No Skin Lesions, No Pruritus, Neuro: no Weakness, No Numbness Psych: No Anxiety/Panic, No Depression, no decrease appetite Heme/Lymph: No Bruising, No Bleeding  Social Patient was born here in [place].  He is homeless and has history of incarceration.  States at discharge he would likely go down to [facility] or stay with a friend.  Past Medical History:  Diagnosis Date   Anxiety    Back pain    Depression    Drug overdose    Mental disorder     Past Surgical History:  Procedure Laterality Date   HERNIA REPAIR       reports that he has been smoking cigarettes. He has been smoking an average of 1 pack per day. He has quit using smokeless tobacco. He reports current alcohol use of about 6.0 standard drinks per week. He reports current drug use. Drugs: IV and Fentanyl. Social History  Allergies  Allergen Reactions   Morphine And Related Hives   Vancomycin Itching and Other (See Comments)    "Red man syndrome"     Family History  Family history unknown: Yes     Prior to Admission medications   Medication Sig Start Date End Date Taking? Authorizing Provider  chlordiazePOXIDE (LIBRIUM) 25 MG capsule 25 mg PO TID x 1 day, then 25 mg PO BID X 1 day, then 25 mg PO QD X 1 day 03/24/21  Yes Cathren Laine, MD  gabapentin (NEURONTIN) 100 MG capsule Take 2 capsules (200 mg total) by mouth 2 (two) times daily. Patient not taking: No sig reported 04/22/20   Antonieta Pert, MD  hydrOXYzine (ATARAX/VISTARIL) 25 MG tablet Take  1 tablet (25 mg total) by mouth every 8 (eight) hours as needed. Patient not taking: No sig reported 04/24/20   Lorre Nick, MD  Multiple Vitamins-Minerals (AIRBORNE GUMMIES PO) Take 3 tablets by mouth daily. Gummies Patient not taking: No sig reported    [provider]  sertraline (ZOLOFT) 50 MG tablet Take 1 tablet (50 mg total) by mouth daily. Patient not taking: No sig reported 04/23/20   Antonieta Pert, MD  traZODone  (DESYREL) 50 MG tablet Take 1 tablet (50 mg total) by mouth at bedtime as needed for sleep. Patient not taking: No sig reported 04/22/20   Antonieta Pert, MD    Physical Exam: Vitals:   03/24/21 1715 03/24/21 1745 03/24/21 1800 03/24/21 1907  BP: (!) 167/101 (!) 176/88 (!) 173/89   Pulse: (!) 124 (!) 118 (!) 122   Resp: 16 13 (!) 21   Temp:    98.8 F (37.1 C)  TempSrc:    Oral  SpO2: 98% 98% 95%   Weight:      Height:        Constitutional: NAD, calm, comfortable, disheveled appearing middle-age male sitting upright in bed eating with food in his facial beard Vitals:   03/24/21 1715 03/24/21 1745 03/24/21 1800 03/24/21 1907  BP: (!) 167/101 (!) 176/88 (!) 173/89   Pulse: (!) 124 (!) 118 (!) 122   Resp: 16 13 (!) 21   Temp:    98.8 F (37.1 C)  TempSrc:    Oral  SpO2: 98% 98% 95%   Weight:      Height:       Eyes: PERRL, lids and conjunctivae normal ENMT: Mucous membranes are moist.  Neck: normal, supple Respiratory: clear to auscultation bilaterally, no wheezing, no crackles. Normal respiratory effort. No accessory muscle use.  Cardiovascular: Regular rate and rhythm, no murmurs / rubs / gallops.  Mild distal nonpitting edema around malleolus.  Abdomen: no tenderness, no masses palpated.  Bowel sounds positive.  Musculoskeletal: no clubbing / cyanosis. No joint deformity upper and lower extremities. Good ROM, no contractures. Normal muscle tone.  Skin: no rashes, lesions, ulcers. No induration Neurologic: CN 2-12 grossly intact. Sensation intact,  Strength 5/5 in all 4.  Psychiatric: Normal judgment and insight. Alert and oriented x 3.  Flat affect.    Labs on Admission: I have personally reviewed following labs and imaging studies  CBC: Recent Labs  Lab 03/24/21 0133 03/24/21 1635  WBC 6.0 8.7  NEUTROABS 1.9 6.5  HGB 14.6 14.1  HCT 42.3 40.6  MCV 95.3 94.4  PLT 141* 129*   Basic Metabolic Panel: Recent Labs  Lab 03/24/21 0133 03/24/21 1635  NA 137  138  K 3.1* 3.8  CL 102 105  CO2 25 25  GLUCOSE 102* 103*  BUN <5* <5*  CREATININE 0.67 0.79  CALCIUM 8.5* 8.5*   GFR: Estimated Creatinine Clearance: 179.9 mL/min (by C-G formula based on SCr of 0.79 mg/dL). Liver Function Tests: Recent Labs  Lab 03/24/21 0133 03/24/21 1635  AST 121* 120*  ALT 50* 46*  ALKPHOS 110 100  BILITOT 1.9* 2.1*  PROT 7.1 6.6  ALBUMIN 3.1* 2.7*   No results for input(s): LIPASE, AMYLASE in the last 168 hours. No results for input(s): AMMONIA in the last 168 hours. Coagulation Profile: No results for input(s): INR, PROTIME in the last 168 hours. Cardiac Enzymes: No results for input(s): CKTOTAL, CKMB, CKMBINDEX, TROPONINI in the last 168 hours. BNP (last 3 results) No results for  input(s): PROBNP in the last 8760 hours. HbA1C: No results for input(s): HGBA1C in the last 72 hours. CBG: No results for input(s): GLUCAP in the last 168 hours. Lipid Profile: No results for input(s): CHOL, HDL, LDLCALC, TRIG, CHOLHDL, LDLDIRECT in the last 72 hours. Thyroid Function Tests: No results for input(s): TSH, T4TOTAL, FREET4, T3FREE, THYROIDAB in the last 72 hours. Anemia Panel: No results for input(s): VITAMINB12, FOLATE, FERRITIN, TIBC, IRON, RETICCTPCT in the last 72 hours. Urine analysis:    Component Value Date/Time   COLORURINE YELLOW 03/24/2021 0133   APPEARANCEUR CLEAR 03/24/2021 0133   LABSPEC <1.005 (L) 03/24/2021 0133   PHURINE 7.0 03/24/2021 0133   GLUCOSEU NEGATIVE 03/24/2021 0133   HGBUR NEGATIVE 03/24/2021 0133   BILIRUBINUR NEGATIVE 03/24/2021 0133   KETONESUR NEGATIVE 03/24/2021 0133   PROTEINUR NEGATIVE 03/24/2021 0133   UROBILINOGEN 0.2 12/18/2012 0113   NITRITE NEGATIVE 03/24/2021 0133   LEUKOCYTESUR NEGATIVE 03/24/2021 0133    Radiological Exams on Admission: No results found.    Assessment/Plan  Alcohol withdrawal/polysubstance abuse -Has history of alcohol withdrawal seizures - Initially presented last night with  alcohol level 213 which is now almost undetectable.  Has new onset fever and tachycardia concerning for mild to moderate withdrawal -start CIWA protocol and admit to Stepdown  -continue daily thiamine and folic -TOCM consult   Acute hypoxia secondary to COVID-19 viral infection -pt later found to be COVID positive which would be a better explanation of the high fever -Start remdesivir since he later became hypoxic requiring 3 -continue to monitor.  Airborne and contact precaution.  Elevated LFTs/total bili - These are chronically elevated likely secondary to heavy alcohol abuse -Also has thrombocytopenia with platelet of 144.  Will check PT/INR to evaluate liver function  DVT prophylaxis:.Lovenox Code Status: Full Family Communication: Plan discussed with patient at bedside  disposition Plan: Home with observation Consults called:  Admission status: Observation Level of care: Stepdown  Status is: Observation  The patient remains OBS appropriate and will d/c before 2 midnights.  Dispo: The patient is from: Home              Anticipated d/c is to: Home              Patient currently is not medically stable to d/c.   Difficult to place patient No         Anselm Jungling DO [facility]   If 7PM-7AM, please contact night-coverage [email or web address]   03/24/2021, 7:11 PM

## 2021-03-24 NOTE — ED Notes (Signed)
EDP notified of pt's temp of 102.6F, HR of 128 and BP of 155/115. EDP at the bedside to evaluate.

## 2021-03-24 NOTE — BH Assessment (Signed)
[facility] Assessment Progress Note   Per Ophelia Shoulder, NP, this voluntary pt does not require psychiatric hospitalization at this time.  Pt is psychiatrically cleared.  Discharge instructions include referrals for area opiate replacement therapy providers.  EDP Cathren Laine, MD and pt's nurse, Ladona Ridgel, have been notified.  Doylene Canning, MA Triage Specialist [phone]

## 2021-03-24 NOTE — Plan of Care (Signed)
VSS, ongoing 

## 2021-03-24 NOTE — ED Notes (Signed)
TTS assessment complete. 

## 2021-03-24 NOTE — ED Notes (Signed)
Pt changed into purple scrubs. Pt belongings placed in bags and put in cabinet next to ice machine behind nurses station 16-22. Pt has three belongings bags.

## 2021-03-24 NOTE — ED Provider Notes (Signed)
Emergency Medicine Observation Re-evaluation Note  Larry Adams is a 35 y.o. male, seen on rounds today.  Pt initially presented to the ED for complaints of etoh intoxication/abuse and related mood disorder. Pt has eaten/drank in ED, normal appetite. BH team has psych cleared for d/c.  No new c/o currently.   Physical Exam  BP (!) 154/111 (BP Location: Left Arm)   Pulse (!) 113   Temp 99 F (37.2 C) (Oral)   Resp 19   Ht 1.854 m (6\' 1" )   Wt 127 kg   SpO2 98%   BMI 36.94 kg/m  Physical Exam General: calm, alert. Skin warm/dry.  Cardiac: regular rate (hr currently 94) Lungs: breathing comfortably Psych: calm, alert, normal mood and affect. Does not appear despondent or acutely depressed. No SI/HI. Pt is not responding to internal stimuli - no acute delusions, hallucinations or acute psychosis noted.   ED Course / MDM    I have reviewed the labs performed to date as well as medications administered while in observation.  Recent changes in the last 24 hours include ED observation and reassessment.   Plan  [facility] team has reassessed and indicates psych clear for d/c.   Given hx etoh abuse/chronic etoh abuse, will give rx librium.   Pt currently appears stable for d/c.   Rec pcp f/u.  Will also provide resource guide re etoh abuse/tx and bh outpatient f/u.        [facility], MD 03/24/21 1436

## 2021-03-24 NOTE — ED Provider Notes (Signed)
4:00 PM Notified by nursing on patient's abnormal vital signs including fever 102.9, tachycardic.  Patient was cleared by psychiatry.  Presents secondary to suicidal ideation, substance abuse.  Normal is elevated on arrival.  He drinks approximately 10-12 beers daily.  History of alcohol withdrawal the past including alcohol withdrawal induced seizure.  Upon my assessment patient with tremors including tongue fasciculations, tactile disturbance, intermittent visual/auditory hallucinations, nausea, mild anxiety; he feels as though he is withdrawing from alcohol. CIWA-Ar approximated at 23 currently.  Give IV fluids, Ativan, Zofran, Tylenol. Will repeat screening labs and CXR, will repeat COVID19 testing.   Labs and imaging ordered and reviewed by myself.   No source of infection identified, symptoms are likely 2/2 acute ETOH withdrawal. Requiring multiple doses of IV ativan. CIWA protocol continued, IVF. Plan admission at this time. D/w TR[facility] who accepts patient for admission.    CRITICAL CARE Performed by: Sloan Leiter   Total critical care time: 32 minutes  Critical care time was exclusive of separately billable procedures and treating other patients.  Critical care was necessary to treat or prevent imminent or life-threatening deterioration.  Critical care was time spent personally by me on the following activities: development of treatment plan with patient and/or surrogate as well as nursing, discussions with consultants, evaluation of patient's response to treatment, examination of patient, obtaining history from patient or surrogate, ordering and performing treatments and interventions, ordering and review of laboratory studies, ordering and review of radiographic studies, pulse oximetry and re-evaluation of patient's condition.    Sloan Leiter, DO 03/25/21 1547

## 2021-03-24 NOTE — BH Assessment (Signed)
Per Beverly Gust, RN pt is having withdrawal symptoms received 1 mg, Ativan is currently sleeping. RN to inform TTS staff once pt is alert and able to be assessed.     Redmond Pulling, MS, [place], [facility] Triage Specialist [phone]

## 2021-03-24 NOTE — Consult Note (Addendum)
Telepsych Consultation   Reason for Consult:  Psychiatric Assessment Referring Physician:  Antonietta Breach, PA-C Location of Patient:   [facility] Location of Provider: Other: virtual home office  Patient Identification: Larry Adams MRN:  [id number] Principal Diagnosis: Substance induced mood disorder ([place]) Diagnosis:  Principal Problem:   Substance induced mood disorder ([place]) Active Problems:   Moderate alcohol use disorder (H[facility])   Severe recurrent major depression without psychotic features ([place])   Polysubstance abuse ([place])   Total Time spent with patient: 30 minutes  Subjective: Larry Adams, 35 y.o., male patient presented to ED with suicidal ideations in the setting of polysubstance usage.  Patient seen for reassessment today and states, "I'm doing okay, but want to get some help."  Patient seen via telepsych by this provider; chart reviewed and consulted with Dr. Dwyane Dee on 03/24/21.  On evaluation Larry Adams reports history for chronic polysubstance abuse. Historically had been sober for several months, was triggered by psychosocial stressors and relapsed; then began feeling suicidal.  He admits to problems with impulsivity and SI with drug use, usually clears up and no longer has such thoughts when he is sober.  Admission BAL was 213; UDS + for amphetamines.  In viewing his liver function tests, AST was elevated at 121; ALT elevated at 50; Alk Phos was WNL.  On admission, he was started on CIWA protocol and given lorazepam prn; zofran for nausea and thiamine supplements.  Based on patient's hx for polysubstance abuse, lorazepam was d/c and he was started on gabapentin 362m po TID to help with withdrawal symptoms.    Patient endorses most of what has already been captured in the HPI assessment. Currently reports nausea, body aches, "feeling dope sick, but not suicidal."  He is future oriented and relates his plan to get SUD treatment.    HPI:  Per EDP Admission Assessment  03/23/2021: Chief Complaint  Patient presents with  . Suicidal  . substance abuse      Larry Adams a 35y.o. male.   35year old male with a history of MDD, substance-induced mood disorder, alcohol abuse, polysubstance abuse presents to the emergency department for suicidal ideations.  He reports being sober from drugs and alcohol for 8 months, but resumed use recently.  States that he has been trying to "drink myself to death" and has been using heroin daily; has consumed 8-10 16-ounce beers per day.  Reports that he does not want to live anymore, but chose to come to the ED to try and get help tonight.  [facility]  [facility]  Has been homeless for the past 3 years.  Denies any homicidal ideations, hallucinations.  Last heroin use was at 2030 tonight.   The history is provided by the patient. No language interpreter was used.     Past Psychiatric History: as outlined below  Risk to Self:  no Risk to Others:  no Prior Inpatient Therapy:  yes Prior Outpatient Therapy:  yes  Past Medical History:  Past Medical History:  Diagnosis Date  . Anxiety   . Back pain   . Depression   . Drug overdose   . Mental disorder     Past Surgical History:  Procedure Laterality Date  . HERNIA REPAIR     Family History:  Family History  Family history unknown: Yes   Family Psychiatric  History: unknown Social History:  Social History   Substance and Sexual Activity  Alcohol  Use Yes  . Alcohol/week: 6.0 standard drinks  . Types: 6 Cans of beer per week   Comment: PTA 25 oz of beer; drinking all day everyday for 2 months     Social History   Substance and Sexual Activity  Drug Use Yes  . Types: IV, Fentanyl   Comment: Overdose on Heroin; IV Fentanyl use    Social History   Socioeconomic History  . Marital status: Single    Spouse name: Not on file  . Number of children: Not on file  . Years of education: Not on file   . Highest education level: Not on file  Occupational History  . Not on file  Tobacco Use  . Smoking status: Every Day    Packs/day: 1.00    Types: Cigarettes  . Smokeless tobacco: Former  Media planner  . Vaping Use: Former  Substance and Sexual Activity  . Alcohol use: Yes    Alcohol/week: 6.0 standard drinks    Types: 6 Cans of beer per week    Comment: PTA 25 oz of beer; drinking all day everyday for 2 months  . Drug use: Yes    Types: IV, Fentanyl    Comment: Overdose on Heroin; IV Fentanyl use  . Sexual activity: Yes  Other Topics Concern  . Not on file  Social History Narrative  . Not on file   Social Determinants of Health   Financial Resource Strain: Not on file  Food Insecurity: Not on file  Transportation Needs: Not on file  Physical Activity: Not on file  Stress: Not on file  Social Connections: Not on file   Additional Social History:    Allergies:   Allergies  Allergen Reactions  . Morphine And Related Hives  . Vancomycin Itching and Other (See Comments)    "Red man syndrome"     Labs:  Results for orders placed or performed during the hospital encounter of 03/24/21 (from the past 48 hour(s))  CBC with Differential     Status: Abnormal   Collection Time: 03/24/21  1:33 AM  Result Value Ref Range   WBC 6.0 4.0 - 10.5 K/uL   RBC 4.44 4.22 - 5.81 MIL/uL   Hemoglobin 14.6 13.0 - 17.0 g/dL   HCT 42.3 39.0 - 52.0 %   MCV 95.3 80.0 - 100.0 fL   MCH 32.9 26.0 - 34.0 pg   MCHC 34.5 30.0 - 36.0 g/dL   RDW 15.5 11.5 - 15.5 %   Platelets 141 (L) 150 - 400 K/uL   nRBC 0.0 0.0 - 0.2 %   Neutrophils Relative % 32 %   Neutro Abs 1.9 1.7 - 7.7 K/uL   Lymphocytes Relative 49 %   Lymphs Abs 3.0 0.7 - 4.0 K/uL   Monocytes Relative 14 %   Monocytes Absolute 0.8 0.1 - 1.0 K/uL   Eosinophils Relative 4 %   Eosinophils Absolute 0.2 0.0 - 0.5 K/uL   Basophils Relative 1 %   Basophils Absolute 0.1 0.0 - 0.1 K/uL   Immature Granulocytes 0 %   Abs Immature  Granulocytes 0.01 0.00 - 0.07 K/uL    Comment: Performed at [facility], [place] [address] [place], [place] [address]  Comprehensive metabolic panel     Status: Abnormal   Collection Time: 03/24/21  1:33 AM  Result Value Ref Range   Sodium 137 135 - 145 mmol/L   Potassium 3.1 (L) 3.5 - 5.1 mmol/L   Chloride 102 98 - 111 mmol/L  CO2 25 22 - 32 mmol/L   Glucose, Bld 102 (H) 70 - 99 mg/dL    Comment: Glucose reference range applies only to samples taken after fasting for at least 8 hours.   BUN <5 (L) 6 - 20 mg/dL   Creatinine, Ser 0.67 0.61 - 1.24 mg/dL   Calcium 8.5 (L) 8.9 - 10.3 mg/dL   Total Protein 7.1 6.5 - 8.1 g/dL   Albumin 3.1 (L) 3.5 - 5.0 g/dL   AST 121 (H) 15 - 41 U/L   ALT 50 (H) 0 - 44 U/L   Alkaline Phosphatase 110 38 - 126 U/L   Total Bilirubin 1.9 (H) 0.3 - 1.2 mg/dL   GFR, Estimated >60 >60 mL/min    Comment: (NOTE) Calculated using the CKD-EPI Creatinine Equation (2021)    Anion gap 10 5 - 15    Comment: Performed at [facility], [place] [address] [place], [place] [address]  Resp Panel by RT-PCR (Flu A&B, Covid) Nasopharyngeal Swab     Status: None   Collection Time: 03/24/21  1:33 AM   Specimen: Nasopharyngeal Swab; Nasopharyngeal(NP) swabs in vial transport medium  Result Value Ref Range   SARS Coronavirus 2 by RT PCR NEGATIVE NEGATIVE    Comment: (NOTE) SARS-CoV-2 target nucleic acids are NOT DETECTED.  The SARS-CoV-2 RNA is generally detectable in upper respiratory specimens during the acute phase of infection. The lowest concentration of SARS-CoV-2 viral copies this assay can detect is 138 copies/mL. A negative result does not preclude SARS-Cov-2 infection and should not be used as the sole basis for treatment or other patient management decisions. A negative result may occur with  improper specimen collection/handling, submission of specimen other than nasopharyngeal swab, presence of viral mutation(s) within  the areas targeted by this assay, and inadequate number of viral copies(<138 copies/mL). A negative result must be combined with clinical observations, patient history, and epidemiological information. The expected result is Negative.  Fact Sheet for Patients:  EntrepreneurPulse.com.au  Fact Sheet for Healthcare Providers:  IncredibleEmployment.be  This test is no t yet approved or cleared by the [place] FDA and  has been authorized for detection and/or diagnosis of SARS-CoV-2 by FDA under an Emergency Use Authorization (EUA). This EUA will remain  in effect (meaning this test can be used) for the duration of the COVID-19 declaration under Section 564(b)(1) of the Act, 21 U.S.C.section 360bbb-3(b)(1), unless the authorization is terminated  or revoked sooner.       Influenza A by PCR NEGATIVE NEGATIVE   Influenza B by PCR NEGATIVE NEGATIVE    Comment: (NOTE) The Xpert Xpress SARS-CoV-2/FLU/RSV plus assay is intended as an aid in the diagnosis of influenza from Nasopharyngeal swab specimens and should not be used as a sole basis for treatment. Nasal washings and aspirates are unacceptable for Xpert Xpress SARS-CoV-2/FLU/RSV testing.  Fact Sheet for Patients: EntrepreneurPulse.com.au  Fact Sheet for Healthcare Providers: IncredibleEmployment.be  This test is not yet approved or cleared by the [place] FDA and has been authorized for detection and/or diagnosis of SARS-CoV-2 by FDA under an Emergency Use Authorization (EUA). This EUA will remain in effect (meaning this test can be used) for the duration of the COVID-19 declaration under Section 564(b)(1) of the Act, 21 U.S.C. section 360bbb-3(b)(1), unless the authorization is terminated or revoked.  Performed at [facility], [place] [address], [place], [place] [address]   Urinalysis, Routine w reflex microscopic  Nasopharyngeal Swab     Status: Abnormal  Collection Time: 03/24/21  1:33 AM  Result Value Ref Range   Color, Urine YELLOW YELLOW   APPearance CLEAR CLEAR   Specific Gravity, Urine <1.005 (L) 1.005 - 1.030   pH 7.0 5.0 - 8.0   Glucose, UA NEGATIVE NEGATIVE mg/dL   Hgb urine dipstick NEGATIVE NEGATIVE   Bilirubin Urine NEGATIVE NEGATIVE   Ketones, ur NEGATIVE NEGATIVE mg/dL   Protein, ur NEGATIVE NEGATIVE mg/dL   Nitrite NEGATIVE NEGATIVE   Leukocytes,Ua NEGATIVE NEGATIVE    Comment: Microscopic not done on urines with negative protein, blood, leukocytes, nitrite, or glucose < 500 mg/dL. Performed at [facility], [place] [address], [place], [place] [address]   Rapid urine drug screen (hospital performed)     Status: Abnormal   Collection Time: 03/24/21  1:33 AM  Result Value Ref Range   Opiates POSITIVE (A) NONE DETECTED   Cocaine NONE DETECTED NONE DETECTED   Benzodiazepines NONE DETECTED NONE DETECTED   Amphetamines NONE DETECTED NONE DETECTED   Tetrahydrocannabinol NONE DETECTED NONE DETECTED   Barbiturates NONE DETECTED NONE DETECTED    Comment: (NOTE) DRUG SCREEN FOR MEDICAL PURPOSES ONLY.  IF CONFIRMATION IS NEEDED FOR ANY PURPOSE, NOTIFY LAB WITHIN 5 DAYS.  LOWEST DETECTABLE LIMITS FOR URINE DRUG SCREEN Drug Class                     Cutoff (ng/mL) Amphetamine and metabolites    1000 Barbiturate and metabolites    200 Benzodiazepine                 226 Tricyclics and metabolites     300 Opiates and metabolites        300 Cocaine and metabolites        300 THC                            50 Performed at [facility], [place] [address], [place], [place] [address]   Ethanol     Status: Abnormal   Collection Time: 03/24/21  2:36 AM  Result Value Ref Range   Alcohol, Ethyl (B) 213 (H) <10 mg/dL    Comment: (NOTE) Lowest detectable limit for serum alcohol is 10 mg/dL.  For medical purposes only. Performed at [facility], [place] [address], [place], [place] [address]   Acetaminophen level     Status: Abnormal   Collection Time: 03/24/21  2:36 AM  Result Value Ref Range   Acetaminophen (Tylenol), Serum <10 (L) 10 - 30 ug/mL    Comment: (NOTE) Therapeutic concentrations vary significantly. A range of 10-30 ug/mL  may be an effective concentration for many patients. However, some  are best treated at concentrations outside of this range. Acetaminophen concentrations >150 ug/mL at 4 hours after ingestion  and >50 ug/mL at 12 hours after ingestion are often associated with  toxic reactions.  Performed at [facility], [place] [address] [place], [place] [address]   Salicylate level     Status: Abnormal   Collection Time: 03/24/21  2:36 AM  Result Value Ref Range   Salicylate Lvl <3.4 (L) 7.0 - 30.0 mg/dL    Comment: Performed at [facility], [place] [address] [place],  [address]    Medications:  Current Facility-Administered Medications  Medication Dose Route Frequency Provider Last Rate Last Admin  . gabapentin (NEURONTIN) capsule 300 mg  300 mg Oral TID Mallie Darting,  NP      . ibuprofen (ADVIL) tablet 600 mg  600 mg Oral Q8H PRN Antonietta Breach, PA-C      . nicotine (NICODERM CQ - dosed in mg/24 hours) patch 21 mg  21 mg Transdermal Daily Antonietta Breach, PA-C   21 mg at 03/24/21 1003  . ondansetron (ZOFRAN) tablet 4 mg  4 mg Oral Q8H PRN Antonietta Breach, PA-C   4 mg at 03/24/21 1028  . thiamine tablet 100 mg  100 mg Oral Daily Antonietta Breach, PA-C   100 mg at 03/24/21 1003   Current Outpatient Medications  Medication Sig Dispense Refill  . chlordiazePOXIDE (LIBRIUM) 25 MG capsule 25 mg PO TID x 1 day, then 25 mg PO BID X 1 day, then 25 mg PO once a day x 1 day (Patient not taking: No sig reported) 6 capsule 0  . gabapentin (NEURONTIN) 100 MG capsule Take 2 capsules (200 mg total) by mouth 2 (two) times daily. (Patient not taking: No sig reported) 90 capsule 0   . hydrOXYzine (ATARAX/VISTARIL) 25 MG tablet Take 1 tablet (25 mg total) by mouth every 8 (eight) hours as needed. (Patient not taking: No sig reported) 15 tablet 0  . Multiple Vitamins-Minerals (AIRBORNE GUMMIES PO) Take 3 tablets by mouth daily. Gummies (Patient not taking: No sig reported)    . sertraline (ZOLOFT) 50 MG tablet Take 1 tablet (50 mg total) by mouth daily. (Patient not taking: No sig reported) 30 tablet 0  . traZODone (DESYREL) 50 MG tablet Take 1 tablet (50 mg total) by mouth at bedtime as needed for sleep. (Patient not taking: No sig reported) 15 tablet 0    Musculoskeletal: Strength & Muscle Tone: within normal limits Gait & Station: normal Patient leans: N/A          Psychiatric Specialty Exam:  Presentation  General Appearance: Fairly Groomed  Eye Contact:Fair  Speech:Clear and Coherent; Normal Rate  Speech Volume:Normal  Handedness:Right   Mood and Affect  Mood:Dysphoric  Affect:Appropriate; Congruent   Thought Process  Thought Processes:Coherent  Descriptions of Associations:Intact  Orientation:Full (Time, Place and Person)  Thought Content:Logical (has improved since admission)  History of Schizophrenia/Schizoaffective disorder:No  Duration of Psychotic Symptoms:No data recorded Hallucinations:Hallucinations: None  Ideas of Reference:None  Suicidal Thoughts:Suicidal Thoughts: No (improved since admission)  Homicidal Thoughts:Homicidal Thoughts: No   Sensorium  Memory:Immediate Good; Recent Good; Remote Good  Judgment:Fair (at baseline based on chronic drug use)  Insight:Fair (at baseline, based on chronic drug use)   Executive Functions  Concentration:Fair  Attention Span:Fair (patient is nauseated)  Recall:Good  Fund of Knowledge:Good  Language:Good   Psychomotor Activity  Psychomotor Activity:Psychomotor Activity: Normal   Assets  Assets:Communication Skills; Desire for Improvement   Sleep   Sleep:Sleep: Good Number of Hours of Sleep: 6    Physical Exam: Physical Exam HENT:     Nose: Nose normal.  Cardiovascular:     Rate and Rhythm: Tachycardia present.  Musculoskeletal:        General: Normal range of motion.     Cervical back: Normal range of motion.  Neurological:     General: No focal deficit present.     Mental Status: He is alert.   Review of Systems  Constitutional: Negative.   HENT: Negative.    Eyes: Negative.   Respiratory: Negative.    Cardiovascular: Negative.   Gastrointestinal:  Positive for nausea. Negative for constipation and diarrhea.  Genitourinary: Negative.   Musculoskeletal: Negative.   Skin: Negative.  Neurological:  Positive for tremors.  Endo/Heme/Allergies: Negative.   Psychiatric/Behavioral:  Positive for depression and substance abuse. Negative for hallucinations, memory loss and suicidal ideas (improved since admission). The patient does not have insomnia.   Blood pressure (!) 154/111, pulse (!) 113, temperature 99 F (37.2 C), temperature source Oral, resp. rate 19, height '6\' 1"'  (1.854 m), weight 127 kg, SpO2 98 %. Body mass index is 36.94 kg/m.  Treatment Plan Summary: Plan- As per above assessment, there are no current grounds for involuntary commitment at this time. Will provide 30 days rx for gabapentin 315m po TID to help with drug withdrawal symptoms.   Patient is not currently interested in inpatient services but expresses agreement to continue outpatient treatment., we have reviewed importance of substance abuse abstinence, potential negative impact substance abuse can have on his relationships and level of functioning, and importance of medication compliance.  Will refer to [facility] at this time.    Disposition: No evidence of imminent risk to self or others at present.   Patient does not meet criteria for psychiatric inpatient admission. Supportive therapy provided about ongoing stressors.  This service was provided via  telemedicine using a 2-way, interactive audio and video technology.  Names of all persons participating in this telemedicine service and their role in this encounter. Name: RCem KosmanRole: Patient  Name: SMerlyn LotRole: PFelts Larry Adams Name: AHampton AbbotRole: Psychiatrist    SMallie Darting NP 03/24/2021 2:28 PM

## 2021-03-24 NOTE — ED Triage Notes (Addendum)
Pt states that he has resorted back to using heroin because nothing is going right for him in life. Pt states that he does not want to be here anymore. Pt states that he has been homeless for the past 3 years.

## 2021-03-24 NOTE — ED Notes (Signed)
Pt given meal tray.

## 2021-03-24 NOTE — ED Notes (Signed)
Pt arouses to voice. Oriented x4. Falls back asleep. This nurse attached pt to cardiac monitor x2.

## 2021-03-24 NOTE — Discharge Instructions (Addendum)
It was our pleasure to provide your ER care today - we hope that you feel better.  Drink plenty of fluids/stay well hydrated. For alcohol withdrawal symptoms, take librium as need, as prescribed. Use resource guide provided to help find outpatient treatment program.  Also follow up with primary care doctor in the next 1-2 weeks - have your blood pressure rechecked then, as it is high today.  For opiate replacement therapy contact one of the following providers at your earliest opportunity:       [facility]      [address], [place] [address]      [phone]      New patients are seen at the walk-in clinic Mondays, Wednesdays and Fridays, 1:00 pm - 3:00 pm       [facility]      [address] [place], [place] [address]      [phone]       [facility]      [address] [place], [place] [address]      [phone]   Return to ER if worse, new symptoms, fevers, new/severe pain, chest pain, trouble breathing, persistent vomiting, or other concern.

## 2021-03-25 DIAGNOSIS — D696 Thrombocytopenia, unspecified: Secondary | ICD-10-CM

## 2021-03-25 LAB — COMPREHENSIVE METABOLIC PANEL
ALT: 39 U/L (ref 0–44)
AST: 94 U/L — ABNORMAL HIGH (ref 15–41)
Albumin: 2.5 g/dL — ABNORMAL LOW (ref 3.5–5.0)
Alkaline Phosphatase: 86 U/L (ref 38–126)
Anion gap: 4 — ABNORMAL LOW (ref 5–15)
BUN: 7 mg/dL (ref 6–20)
CO2: 26 mmol/L (ref 22–32)
Calcium: 8 mg/dL — ABNORMAL LOW (ref 8.9–10.3)
Chloride: 106 mmol/L (ref 98–111)
Creatinine, Ser: 0.76 mg/dL (ref 0.61–1.24)
GFR, Estimated: >60 mL/min (ref >60)
Glucose, Bld: 102 mg/dL — ABNORMAL HIGH (ref 70–99)
Potassium: 3.4 mmol/L — ABNORMAL LOW (ref 3.5–5.1)
Sodium: 136 mmol/L (ref 135–145)
Total Bilirubin: 2.2 mg/dL — ABNORMAL HIGH (ref 0.3–1.2)
Total Protein: 6 g/dL — ABNORMAL LOW (ref 6.5–8.1)

## 2021-03-25 LAB — PROTIME-INR
INR: 1.6 — ABNORMAL HIGH (ref 0.8–1.2)
Prothrombin Time: 18.8 seconds — ABNORMAL HIGH (ref 11.4–15.2)

## 2021-03-25 LAB — HIV ANTIBODY (ROUTINE TESTING W REFLEX): HIV Screen 4th Generation wRfx: NONREACTIVE

## 2021-03-25 MED ORDER — POTASSIUM CHLORIDE CRYS ER 20 MEQ PO TBCR
40.0000 meq | EXTENDED_RELEASE_TABLET | Freq: Once | ORAL | Status: AC
  Administered 2021-03-25: 40 meq via ORAL
  Filled 2021-03-25: qty 2

## 2021-03-25 MED ORDER — REMDESIVIR 100 MG IV SOLR
100.0000 mg | INTRAVENOUS | Status: AC
  Administered 2021-03-25 (×2): 100 mg via INTRAVENOUS
  Filled 2021-03-25 (×2): qty 20

## 2021-03-25 MED ORDER — SODIUM CHLORIDE 0.9 % IV SOLN
100.0000 mg | Freq: Every day | INTRAVENOUS | Status: DC
  Administered 2021-03-26 – 2021-03-27 (×2): 100 mg via INTRAVENOUS
  Filled 2021-03-25 (×2): qty 20

## 2021-03-25 NOTE — Plan of Care (Signed)
  Problem: Education: Goal: Knowledge of General Education information will improve Description: Including pain rating scale, medication(s)/side effects and non-pharmacologic comfort measures Outcome: Progressing   Problem: Clinical Measurements: Goal: Ability to maintain clinical measurements within normal limits will improve Outcome: Progressing Goal: Will remain free from infection Outcome: Progressing   

## 2021-03-25 NOTE — TOC Initial Note (Signed)
Transition of Care [facility]) - Initial/Assessment Note    Patient Details  Name: Larry Adams MRN: [id number] Date of Birth: [birth year]  Transition of Care [facility]) CM/SW Contact:    Golda Acre, RN Phone Number: 03/25/2021, 10:02 AM  Clinical Narrative:                 35 y.o. male with medical history significant for polysubstance abuse (heroin, alcohol, benzo) and depression who reports that his crisis counselor dropped him off in the ED last night since he was having a feeling of hopelessness and was drinking and using IV heroin.  States he used about 5-6 beer.  He denies any suicidal ideation or hallucination.  He was initially cleared by psychiatry for discharge.  However, he later became febrile up to 102.9, tachycardic and was noted by ED physician to be tremulous with tactile hallucination so hospitalist was called for admission for alcohol withdrawal.   On my evaluation, he was tachycardic up to 120s and hypertensive up to systolic of 173.  However he was sitting comfortably eating dinner.  Denies any hallucinations.  No tremors.   CBC without leukocytosis or anemia.  Platelet of 141.  Sodium of 137, potassium of 3.1, BG of 102.  AST of 121, ALT 50, total bilirubin 1.9. Tylenol and salicylate level were negative. His initial alcohol level at presentation last evening of 213 and currently is less than 10. UDS positive for opioids   Chest x-ray negative.  COVID and flu PCR negative.   He reports having alcohol withdrawal seizures about a year ago. TOC PLAN OF CARE: Will need substance resources and homeless resources when stable and alert. Following for progression. Expected Discharge Plan: Homeless Shelter Barriers to Discharge: Continued Medical Work up   Patient Goals and CMS Choice Patient states their goals for this hospitalization and ongoing recovery are:: to be able to stay on my on CMS Medicare.gov Compare Post Acute Care list provided to:: Patient    Expected  Discharge Plan and Services Expected Discharge Plan: Homeless Shelter   Discharge Planning Services: CM Consult, Indigent Health Clinic, [facility]   Living arrangements for the past 2 months: Homeless Shelter                                      Prior Living Arrangements/Services Living arrangements for the past 2 months: Homeless Shelter Lives with:: Self Patient language and need for interpreter reviewed:: Yes Do you feel safe going back to the place where you live?: Yes            Criminal Activity/Legal Involvement Pertinent to Current Situation/Hospitalization: No - Comment as needed  Activities of Daily Living Home Assistive Devices/Equipment: None ADL Screening (condition at time of admission) Patient's cognitive ability adequate to safely complete daily activities?: Yes Is the patient deaf or have difficulty hearing?: No Does the patient have difficulty seeing, even when wearing glasses/contacts?: No Does the patient have difficulty concentrating, remembering, or making decisions?: No Patient able to express need for assistance with ADLs?: Yes Does the patient have difficulty dressing or bathing?: No Independently performs ADLs?: Yes (appropriate for developmental age) Does the patient have difficulty walking or climbing stairs?: No Weakness of Legs: Both Weakness of Arms/Hands: Both  Permission Sought/Granted                  Emotional Assessment Appearance:: Appears stated age  Orientation: : Fluctuating Orientation (Suspected and/or reported Sundowners) Alcohol / Substance Use: Alcohol Use, Tobacco Use, Illicit Drugs Psych Involvement: No (comment)  Admission diagnosis:  Alcohol withdrawal (HCC) [F10.239] Chronic alcoholism (HCC) [F10.20] Elevated blood pressure reading [R03.0] Polysubstance abuse (HCC) [F19.10] Alcohol abuse with alcohol-induced mood disorder (HCC) [F10.14] Acute alcoholic intoxication without complication (HCC)  [F10.920] Alcohol withdrawal syndrome with perceptual disturbance (HCC) [F10.232] Patient Active Problem List   Diagnosis Date Noted   Alcohol withdrawal (HCC) 03/24/2021   Elevated LFTs 03/24/2021   Acute respiratory failure with hypoxia (HCC) 01/11/2020   Polysubstance abuse (HCC) 01/11/2020   Aspiration pneumonia (HCC)    Severe recurrent major depression without psychotic features (HCC) 11/06/2018   Alcohol abuse with alcohol-induced mood disorder (HCC) 09/12/2017   MDD (major depressive disorder), recurrent severe, without psychosis (HCC) 09/12/2017   Benzodiazepine dependence (HCC) 07/23/2014   Moderate alcohol use disorder (HCC) 07/23/2014   Substance induced mood disorder (HCC) 07/23/2014   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   PCP:  Patient, No Pcp Per (Inactive) Pharmacy:   [id number] [place], [place] - [address] AT [facility] [address] [place] [address] Phone: [phone] Fax: [phone]     Social Determinants of Health (SDOH) Interventions    Readmission Risk Interventions No flowsheet data found.

## 2021-03-25 NOTE — Progress Notes (Signed)
PROGRESS NOTE    Larry Adams  TMA:[id number] DOB: 1985-11-07 DOA: 03/24/2021 PCP: Patient, No Pcp Per (Inactive)   Brief Narrative:  Larry Adams is a 35 y.o. male with medical history significant for polysubstance abuse (heroin, alcohol, benzo) and depression who reports that his crisis counselor dropped him off in the ED last night since he was having a feeling of hopelessness and was drinking and using IV heroin. Noted by ED physician to be tremulous with tactile hallucination so hospitalist was called for admission for alcohol withdrawal. Pt reports having alcohol withdrawal seizures about a year ago.  Assessment & Plan:  Alcohol withdrawal/polysubstance abuse - Has history of alcohol withdrawal seizures in the past - Initially presented last night with alcohol level >200 which is now almost undetectable.  Has new onset fever and tachycardia concerning for mild to moderate withdrawal - Continue CIWA protocol, continues to be somewhat tremulous this morning but improving - Continue daily thiamine and folic - TOCM consult    Questionable acute COVID-19 viral infection Transient hypoxia while sleeping likely in the setting of sleep apnea, previously undiagnosed - Continue remdesivir x5 days given questionable respiratory symptoms at intake, consider discontinuation at day 3 given negative x-ray - No indications for steroids, currently on room air without hypoxia - Continue airborne and contact precaution.   Elevated LFTs/total bili -Appear to be chronically elevated in the setting of heavy alcohol use/abuse -Thrombocytopenia, mild, ongoing -INR 1.6, consistent with liver dysfunction   DVT prophylaxis: Lovenox Code Status: Full Family Communication: None present  Status is: Inpatient  Dispo: The patient is from: Home              Anticipated d/c is to: Home              Anticipated d/c date is: 48-72h              Patient currently not medically stable for  discharge  Consultants:  None  Procedures:  None  Antimicrobials:  Remdesivir x3 - 5 days  Subjective: No acute issues or events overnight denies nausea vomiting diarrhea constipation headache fevers chills or chest pain.  Somewhat tremulous this morning but appears to be resolving.  Objective: Vitals:   03/25/21 0345 03/25/21 0400 03/25/21 0500 03/25/21 0600  BP: (!) 154/104 (!) 151/95 (!) 146/84 (!) 143/92  Pulse: 94 96 95 90  Resp: 17 20 (!) 21 19  Temp: 98.2 F (36.8 C)     TempSrc: Oral     SpO2: 98% 96% 97% 94%  Weight:      Height:        Intake/Output Summary (Last 24 hours) at 03/25/2021 0732 Last data filed at 03/25/2021 0358 Gross per 24 hour  Intake 523.47 ml  Output 405 ml  Net 118.47 ml   Filed Weights   03/24/21 0124  Weight: 127 kg    Examination:  General exam: Appears calm and comfortable  Respiratory system: Clear to auscultation. Respiratory effort normal. Cardiovascular system: S1 & S2 heard, RRR. No JVD, murmurs, rubs, gallops or clicks. No pedal edema. Gastrointestinal system: Abdomen is nondistended, soft and nontender. No organomegaly or masses felt. Normal bowel sounds heard. Central nervous system: Alert and oriented. No focal neurological deficits. Extremities: Symmetric 5 x 5 power. Skin: No rashes, lesions or ulcers Psychiatry: Judgement and insight appear normal. Mood & affect appropriate.     Data Reviewed: I have personally reviewed following labs and imaging studies  CBC: Recent Labs  Lab 03/24/21 0133 03/24/21  1635  WBC 6.0 8.7  NEUTROABS 1.9 6.5  HGB 14.6 14.1  HCT 42.3 40.6  MCV 95.3 94.4  PLT 141* 129*   Basic Metabolic Panel: Recent Labs  Lab 03/24/21 0133 03/24/21 1635 03/25/21 0312  NA 137 138 136  K 3.1* 3.8 3.4*  CL 102 105 106  CO2 25 25 26   GLUCOSE 102* 103* 102*  BUN <5* <5* 7  CREATININE 0.67 0.79 0.76  CALCIUM 8.5* 8.5* 8.0*   GFR: Estimated Creatinine Clearance: 179.9 mL/min (by C-G  formula based on SCr of 0.76 mg/dL). Liver Function Tests: Recent Labs  Lab 03/24/21 0133 03/24/21 1635 03/25/21 0312  AST 121* 120* 94*  ALT 50* 46* 39  ALKPHOS 110 100 86  BILITOT 1.9* 2.1* 2.2*  PROT 7.1 6.6 6.0*  ALBUMIN 3.1* 2.7* 2.5*   No results for input(s): LIPASE, AMYLASE in the last 168 hours. No results for input(s): AMMONIA in the last 168 hours. Coagulation Profile: Recent Labs  Lab 03/25/21 0312  INR 1.6*   Cardiac Enzymes: No results for input(s): CKTOTAL, CKMB, CKMBINDEX, TROPONINI in the last 168 hours. BNP (last 3 results) No results for input(s): PROBNP in the last 8760 hours. HbA1C: No results for input(s): HGBA1C in the last 72 hours. CBG: No results for input(s): GLUCAP in the last 168 hours. Lipid Profile: No results for input(s): CHOL, HDL, LDLCALC, TRIG, CHOLHDL, LDLDIRECT in the last 72 hours. Thyroid Function Tests: No results for input(s): TSH, T4TOTAL, FREET4, T3FREE, THYROIDAB in the last 72 hours. Anemia Panel: No results for input(s): VITAMINB12, FOLATE, FERRITIN, TIBC, IRON, RETICCTPCT in the last 72 hours. Sepsis Labs: No results for input(s): PROCALCITON, LATICACIDVEN in the last 168 hours.  Recent Results (from the past 240 hour(s))  Resp Panel by RT-PCR (Flu A&B, Covid) Nasopharyngeal Swab     Status: None   Collection Time: 03/24/21  1:33 AM   Specimen: Nasopharyngeal Swab; Nasopharyngeal(NP) swabs in vial transport medium  Result Value Ref Range Status   SARS Coronavirus 2 by RT PCR NEGATIVE NEGATIVE Final    Comment: (NOTE) SARS-CoV-2 target nucleic acids are NOT DETECTED.  The SARS-CoV-2 RNA is generally detectable in upper respiratory specimens during the acute phase of infection. The lowest concentration of SARS-CoV-2 viral copies this assay can detect is 138 copies/mL. A negative result does not preclude SARS-Cov-2 infection and should not be used as the sole basis for treatment or other patient management decisions. A  negative result may occur with  improper specimen collection/handling, submission of specimen other than nasopharyngeal swab, presence of viral mutation(s) within the areas targeted by this assay, and inadequate number of viral copies(<138 copies/mL). A negative result must be combined with clinical observations, patient history, and epidemiological information. The expected result is Negative.  Fact Sheet for Patients:  03/26/21  Fact Sheet for Healthcare Providers:  BloggerCourse.com  This test is no t yet approved or cleared by the [place] FDA and  has been authorized for detection and/or diagnosis of SARS-CoV-2 by FDA under an Emergency Use Authorization (EUA). This EUA will remain  in effect (meaning this test can be used) for the duration of the COVID-19 declaration under Section 564(b)(1) of the Act, 21 U.S.C.section 360bbb-3(b)(1), unless the authorization is terminated  or revoked sooner.       Influenza A by PCR NEGATIVE NEGATIVE Final   Influenza B by PCR NEGATIVE NEGATIVE Final    Comment: (NOTE) The Xpert Xpress SARS-CoV-2/FLU/RSV plus assay is intended as an aid in  the diagnosis of influenza from Nasopharyngeal swab specimens and should not be used as a sole basis for treatment. Nasal washings and aspirates are unacceptable for Xpert Xpress SARS-CoV-2/FLU/RSV testing.  Fact Sheet for Patients: BloggerCourse.com  Fact Sheet for Healthcare Providers: SeriousBroker.it  This test is not yet approved or cleared by the [place] FDA and has been authorized for detection and/or diagnosis of SARS-CoV-2 by FDA under an Emergency Use Authorization (EUA). This EUA will remain in effect (meaning this test can be used) for the duration of the COVID-19 declaration under Section 564(b)(1) of the Act, 21 U.S.C. section 360bbb-3(b)(1), unless the authorization  is terminated or revoked.  Performed at [facility], [address] [place], [place] [address]   Resp Panel by RT-PCR (Flu A&B, Covid) Nasopharyngeal Swab     Status: Abnormal   Collection Time: 03/24/21  4:36 PM   Specimen: Nasopharyngeal Swab; Nasopharyngeal(NP) swabs in vial transport medium  Result Value Ref Range Status   SARS Coronavirus 2 by RT PCR POSITIVE (A) NEGATIVE Final    Comment: RESULT CALLED TO, READ BACK BY AND VERIFIED WITH: CRUZ,K RN @2121  ON 03/24/21 JACKSON,K (NOTE) SARS-CoV-2 target nucleic acids are DETECTED.  The SARS-CoV-2 RNA is generally detectable in upper respiratory specimens during the acute phase of infection. Positive results are indicative of the presence of the identified virus, but do not rule out bacterial infection or co-infection with other pathogens not detected by the test. Clinical correlation with patient history and other diagnostic information is necessary to determine patient infection status. The expected result is Negative.  Fact Sheet for Patients: 03/26/21  Fact Sheet for Healthcare Providers: BloggerCourse.com  This test is not yet approved or cleared by the [place] FDA and  has been authorized for detection and/or diagnosis of SARS-CoV-2 by FDA under an Emergency Use Authorization (EUA).  This EUA will remain in effect (meaning this test can  be used) for the duration of  the COVID-19 declaration under Section 564(b)(1) of the Act, 21 U.S.C. section 360bbb-3(b)(1), unless the authorization is terminated or revoked sooner.     Influenza A by PCR NEGATIVE NEGATIVE Final   Influenza B by PCR NEGATIVE NEGATIVE Final    Comment: (NOTE) The Xpert Xpress SARS-CoV-2/FLU/RSV plus assay is intended as an aid in the diagnosis of influenza from Nasopharyngeal swab specimens and should not be used as a sole basis for treatment. Nasal washings  and aspirates are unacceptable for Xpert Xpress SARS-CoV-2/FLU/RSV testing.  Fact Sheet for Patients: [place]  Fact Sheet for Healthcare Providers: BloggerCourse.com  This test is not yet approved or cleared by the [place] FDA and has been authorized for detection and/or diagnosis of SARS-CoV-2 by FDA under an Emergency Use Authorization (EUA). This EUA will remain in effect (meaning this test can be used) for the duration of the COVID-19 declaration under Section 564(b)(1) of the Act, 21 U.S.C. section 360bbb-3(b)(1), unless the authorization is terminated or revoked.  Performed at [facility], [address] [place], [place]   MRSA Next Gen by PCR, Nasal     Status: None   Collection Time: 03/24/21  8:44 PM   Specimen: Nasal Mucosa; Nasal Swab  Result Value Ref Range Status   MRSA by PCR Next Gen NOT DETECTED NOT DETECTED Final    Comment: (NOTE) The GeneXpert MRSA Assay (FDA approved for NASAL specimens only), is one component of a comprehensive MRSA colonization surveillance program. It is not intended to diagnose MRSA  infection nor to guide or monitor treatment for MRSA infections. Test performance is not FDA approved in patients less than 45 years old. Performed at [facility], [address] [place], [place] [address]          Radiology Studies: DG Chest Portable 1 View  Result Date: 03/24/2021 CLINICAL DATA:  Febrile. Fever today of 102.25F, HR of 128 and BP of 155/115. Recent use of heroin. EXAM: PORTABLE CHEST 1 VIEW COMPARISON:  Chest x-ray 01/11/2020 FINDINGS: The heart and mediastinal contours are unchanged. No focal consolidation. No pulmonary edema. No pleural effusion. No pneumothorax. No acute osseous abnormality. IMPRESSION: No active disease. Electronically Signed   By: Tish Frederickson M.D.   On: 03/24/2021 17:39        Scheduled Meds:   Chlorhexidine Gluconate Cloth  6 each Topical Daily   enoxaparin (LOVENOX) injection  60 mg Subcutaneous Q24H   folic acid  1 mg Oral Daily   LORazepam  1 mg Oral Once   multivitamin with minerals  1 tablet Oral Daily   nicotine  21 mg Transdermal Daily   ondansetron  8 mg Oral Once   thiamine  100 mg Oral Daily   Continuous Infusions:  [START ON 03/26/2021] remdesivir 100 mg in NS 100 mL       LOS: 0 days   Time spent:  Azucena Fallen, DO [facility]  If 7PM-7AM, please contact night-coverage [email or web address]  03/25/2021, 7:32 AM

## 2021-03-26 DIAGNOSIS — F10232 Alcohol dependence with withdrawal with perceptual disturbance: Secondary | ICD-10-CM

## 2021-03-26 DIAGNOSIS — F10920 Alcohol use, unspecified with intoxication, uncomplicated: Secondary | ICD-10-CM

## 2021-03-26 DIAGNOSIS — F1014 Alcohol abuse with alcohol-induced mood disorder: Secondary | ICD-10-CM

## 2021-03-26 LAB — CBC
HCT: 40.8 % (ref 39.0–52.0)
Hemoglobin: 13.8 g/dL (ref 13.0–17.0)
MCH: 32.3 pg (ref 26.0–34.0)
MCHC: 33.8 g/dL (ref 30.0–36.0)
MCV: 95.6 fL (ref 80.0–100.0)
Platelets: 90 10*3/uL — ABNORMAL LOW (ref 150–400)
RBC: 4.27 MIL/uL (ref 4.22–5.81)
RDW: 15.6 % — ABNORMAL HIGH (ref 11.5–15.5)
WBC: 6.9 10*3/uL (ref 4.0–10.5)
nRBC: 0 % (ref 0.0–0.2)

## 2021-03-26 LAB — COMPREHENSIVE METABOLIC PANEL
ALT: 34 U/L (ref 0–44)
AST: 77 U/L — ABNORMAL HIGH (ref 15–41)
Albumin: 2.3 g/dL — ABNORMAL LOW (ref 3.5–5.0)
Alkaline Phosphatase: 93 U/L (ref 38–126)
Anion gap: 5 (ref 5–15)
BUN: 8 mg/dL (ref 6–20)
CO2: 26 mmol/L (ref 22–32)
Calcium: 8.4 mg/dL — ABNORMAL LOW (ref 8.9–10.3)
Chloride: 103 mmol/L (ref 98–111)
Creatinine, Ser: 0.72 mg/dL (ref 0.61–1.24)
GFR, Estimated: >60 mL/min (ref >60)
Glucose, Bld: 95 mg/dL (ref 70–99)
Potassium: 3.6 mmol/L (ref 3.5–5.1)
Sodium: 134 mmol/L — ABNORMAL LOW (ref 135–145)
Total Bilirubin: 1.3 mg/dL — ABNORMAL HIGH (ref 0.3–1.2)
Total Protein: 6 g/dL — ABNORMAL LOW (ref 6.5–8.1)

## 2021-03-26 NOTE — Plan of Care (Signed)

## 2021-03-26 NOTE — Progress Notes (Signed)
Patient was informed of the transfer. All personal belongings were returned. Report called to [facility] RN.

## 2021-03-26 NOTE — Progress Notes (Signed)
PROGRESS NOTE  Larry Adams TKZ:[id number] DOB: 01-15-1986 DOA: 03/24/2021 PCP: Patient, No Pcp Per (Inactive)   LOS: 1 day   Brief Narrative / Interim history: Larry Adams is a 35 y.o. male with medical history significant for polysubstance abuse (heroin, alcohol, benzo) and depression who reports that his crisis counselor dropped him off in the ED last night since he was having a feeling of hopelessness and was drinking and using IV heroin. Noted by ED physician to be tremulous with tactile hallucination so hospitalist was called for admission for alcohol withdrawal. Pt reports having alcohol withdrawal seizures about a year ago  Subjective / 24h Interval events: Feeling well this morning, states as long as he takes Ativan his tremulousness is controlled.  No nausea or vomiting.  Assessment & Plan: Principal Problem Alcohol withdrawal/polysubstance abuse -patient was admitted to progressive due to history of alcohol withdrawal seizures in the past, he is at high risk for DTs.  Last alcoholic drink was on Monday and he is at high risk for the first 72 hours.  Continue CIWA protocol, thiamine, folic acid  Active Problems Questionable acute COVID-19 viral infection Transient hypoxia while sleeping likely in the setting of sleep apnea, previously undiagnosed - Continue remdesivir x5 days given questionable respiratory symptoms at intake, no significant respiratory symptoms, probably stop Remdesivir after 3 days -No indications for steroids, currently on room air without hypoxia -Continue airborne and contact precaution.   Elevated LFTs/total bili -Appear to be chronically elevated in the setting of heavy alcohol use/abuse -Thrombocytopenia, mild, ongoing, due to EtOH use -INR 1.6, consistent with liver dysfunction  Scheduled Meds:  Chlorhexidine Gluconate Cloth  6 each Topical Daily   enoxaparin (LOVENOX) injection  60 mg Subcutaneous Q24H   folic acid  1 mg Oral Daily    LORazepam  1 mg Oral Once   multivitamin with minerals  1 tablet Oral Daily   nicotine  21 mg Transdermal Daily   ondansetron  8 mg Oral Once   thiamine  100 mg Oral Daily   Continuous Infusions:  remdesivir 100 mg in NS 100 mL Stopped (03/26/21 0945)   PRN Meds:.ibuprofen, LORazepam **OR** LORazepam, ondansetron  Diet Orders (From admission, onward)     Start     Ordered   03/24/21 1906  Diet regular Room service appropriate? Yes; Fluid consistency: Thin  Diet effective now       Question Answer Comment  Room service appropriate? Yes   Fluid consistency: Thin      03/24/21 1905            DVT prophylaxis: Place and maintain sequential compression device Start: 03/24/21 2219     Code Status: Full Code  Family Communication: No family at bedside  Status is: Inpatient  Remains inpatient appropriate because:Inpatient level of care appropriate due to severity of illness  Dispo: The patient is from: Home              Anticipated d/c is to: Home              Patient currently is not medically stable to d/c.   Difficult to place patient No   Level of care: Progressive  Consultants:  none  Procedures:  none  Microbiology  none  Antimicrobials: none    Objective: Vitals:   03/26/21 0800 03/26/21 0900 03/26/21 1000 03/26/21 1200  BP: (!) 153/128 (!) 152/79 133/81   Pulse: 100 86 88   Resp: (!) 22 (!) 22 14   Temp: 98.4  F (36.9 C)   98.6 F (37 C)  TempSrc: Oral   Oral  SpO2: 97% 99% 97%   Weight:      Height:        Intake/Output Summary (Last 24 hours) at 03/26/2021 1215 Last data filed at 03/26/2021 1023 Gross per 24 hour  Intake 1100 ml  Output 1750 ml  Net -650 ml   Filed Weights   03/24/21 0124  Weight: 127 kg    Examination:  Constitutional: NAD, mildly tremulous Eyes: no scleral icterus ENMT: Mucous membranes are moist.  Neck: normal, supple Respiratory: clear to auscultation bilaterally, no wheezing, no crackles. Normal  respiratory effort. No accessory muscle use.  Cardiovascular: Regular rate and rhythm, no murmurs / rubs / gallops. No LE edema.  Abdomen: non distended, no tenderness. Bowel sounds positive.  Neurologic: CN 2-12 grossly intact. Strength 5/5 in all 4.   Data Reviewed: I have independently reviewed following labs and imaging studies   CBC: Recent Labs  Lab 03/24/21 0133 03/24/21 1635 03/26/21 0233  WBC 6.0 8.7 6.9  NEUTROABS 1.9 6.5  --   HGB 14.6 14.1 13.8  HCT 42.3 40.6 40.8  MCV 95.3 94.4 95.6  PLT 141* 129* 90*   Basic Metabolic Panel: Recent Labs  Lab 03/24/21 0133 03/24/21 1635 03/25/21 0312 03/26/21 0233  NA 137 138 136 134*  K 3.1* 3.8 3.4* 3.6  CL 102 105 106 103  CO2 25 25 26 26   GLUCOSE 102* 103* 102* 95  BUN <5* <5* 7 8  CREATININE 0.67 0.79 0.76 0.72  CALCIUM 8.5* 8.5* 8.0* 8.4*   Liver Function Tests: Recent Labs  Lab 03/24/21 0133 03/24/21 1635 03/25/21 0312 03/26/21 0233  AST 121* 120* 94* 77*  ALT 50* 46* 39 34  ALKPHOS 110 100 86 93  BILITOT 1.9* 2.1* 2.2* 1.3*  PROT 7.1 6.6 6.0* 6.0*  ALBUMIN 3.1* 2.7* 2.5* 2.3*   Coagulation Profile: Recent Labs  Lab 03/25/21 0312  INR 1.6*   HbA1C: No results for input(s): HGBA1C in the last 72 hours. CBG: No results for input(s): GLUCAP in the last 168 hours.  Recent Results (from the past 240 hour(s))  Resp Panel by RT-PCR (Flu A&B, Covid) Nasopharyngeal Swab     Status: None   Collection Time: 03/24/21  1:33 AM   Specimen: Nasopharyngeal Swab; Nasopharyngeal(NP) swabs in vial transport medium  Result Value Ref Range Status   SARS Coronavirus 2 by RT PCR NEGATIVE NEGATIVE Final    Comment: (NOTE) SARS-CoV-2 target nucleic acids are NOT DETECTED.  The SARS-CoV-2 RNA is generally detectable in upper respiratory specimens during the acute phase of infection. The lowest concentration of SARS-CoV-2 viral copies this assay can detect is 138 copies/mL. A negative result does not preclude  SARS-Cov-2 infection and should not be used as the sole basis for treatment or other patient management decisions. A negative result may occur with  improper specimen collection/handling, submission of specimen other than nasopharyngeal swab, presence of viral mutation(s) within the areas targeted by this assay, and inadequate number of viral copies(<138 copies/mL). A negative result must be combined with clinical observations, patient history, and epidemiological information. The expected result is Negative.  Fact Sheet for Patients:  03/26/21  Fact Sheet for Healthcare Providers:  BloggerCourse.com  This test is no t yet approved or cleared by the [employer] FDA and  has been authorized for detection and/or diagnosis of SARS-CoV-2 by FDA under an Emergency Use Authorization (EUA). This EUA will remain  in effect (meaning this test can be used) for the duration of the COVID-19 declaration under Section 564(b)(1) of the Act, 21 U.S.C.section 360bbb-3(b)(1), unless the authorization is terminated  or revoked sooner.       Influenza A by PCR NEGATIVE NEGATIVE Final   Influenza B by PCR NEGATIVE NEGATIVE Final    Comment: (NOTE) The Xpert Xpress SARS-CoV-2/FLU/RSV plus assay is intended as an aid in the diagnosis of influenza from Nasopharyngeal swab specimens and should not be used as a sole basis for treatment. Nasal washings and aspirates are unacceptable for Xpert Xpress SARS-CoV-2/FLU/RSV testing.  Fact Sheet for Patients: BloggerCourse.com  Fact Sheet for Healthcare Providers: SeriousBroker.it  This test is not yet approved or cleared by the [place] FDA and has been authorized for detection and/or diagnosis of SARS-CoV-2 by FDA under an Emergency Use Authorization (EUA). This EUA will remain in effect (meaning this test can be used) for the duration of  the COVID-19 declaration under Section 564(b)(1) of the Act, 21 U.S.C. section 360bbb-3(b)(1), unless the authorization is terminated or revoked.  Performed at [facility], [address], [place], [place] [address]   Resp Panel by RT-PCR (Flu A&B, Covid) Nasopharyngeal Swab     Status: Abnormal   Collection Time: 03/24/21  4:36 PM   Specimen: Nasopharyngeal Swab; Nasopharyngeal(NP) swabs in vial transport medium  Result Value Ref Range Status   SARS Coronavirus 2 by RT PCR POSITIVE (A) NEGATIVE Final    Comment: RESULT CALLED TO, READ BACK BY AND VERIFIED WITH: CRUZ,K RN @2121  ON 03/24/21 JACKSON,K (NOTE) SARS-CoV-2 target nucleic acids are DETECTED.  The SARS-CoV-2 RNA is generally detectable in upper respiratory specimens during the acute phase of infection. Positive results are indicative of the presence of the identified virus, but do not rule out bacterial infection or co-infection with other pathogens not detected by the test. Clinical correlation with patient history and other diagnostic information is necessary to determine patient infection status. The expected result is Negative.  Fact Sheet for Patients: 03/26/21  Fact Sheet for Healthcare Providers: BloggerCourse.com  This test is not yet approved or cleared by the [employer] FDA and  has been authorized for detection and/or diagnosis of SARS-CoV-2 by FDA under an Emergency Use Authorization (EUA).  This EUA will remain in effect (meaning this test can  be used) for the duration of  the COVID-19 declaration under Section 564(b)(1) of the Act, 21 U.S.C. section 360bbb-3(b)(1), unless the authorization is terminated or revoked sooner.     Influenza A by PCR NEGATIVE NEGATIVE Final   Influenza B by PCR NEGATIVE NEGATIVE Final    Comment: (NOTE) The Xpert Xpress SARS-CoV-2/FLU/RSV plus assay is intended as an aid in the diagnosis of  influenza from Nasopharyngeal swab specimens and should not be used as a sole basis for treatment. Nasal washings and aspirates are unacceptable for Xpert Xpress SARS-CoV-2/FLU/RSV testing.  Fact Sheet for Patients: [place]  Fact Sheet for Healthcare Providers: BloggerCourse.com  This test is not yet approved or cleared by the [employer] FDA and has been authorized for detection and/or diagnosis of SARS-CoV-2 by FDA under an Emergency Use Authorization (EUA). This EUA will remain in effect (meaning this test can be used) for the duration of the COVID-19 declaration under Section 564(b)(1) of the Act, 21 U.S.C. section 360bbb-3(b)(1), unless the authorization is terminated or revoked.  Performed at [facility], [address] [place], [place]   MRSA Next Gen by PCR,  Nasal     Status: None   Collection Time: 03/24/21  8:44 PM   Specimen: Nasal Mucosa; Nasal Swab  Result Value Ref Range Status   MRSA by PCR Next Gen NOT DETECTED NOT DETECTED Final    Comment: (NOTE) The GeneXpert MRSA Assay (FDA approved for NASAL specimens only), is one component of a comprehensive MRSA colonization surveillance program. It is not intended to diagnose MRSA infection nor to guide or monitor treatment for MRSA infections. Test performance is not FDA approved in patients less than 60 years old. Performed at [facility], [address] [place], [place] [address]      Radiology Studies: No results found.   Pamella Pert, MD, PhD [facility]  Between 7 am - 7 pm I am available, please contact me via Amion (for emergencies) or Securechat (non urgent messages)  Between 7 pm - 7 am I am not available, please contact night coverage MD/APP via Amion

## 2021-03-27 LAB — CBC
HCT: 40 % (ref 39.0–52.0)
Hemoglobin: 14 g/dL (ref 13.0–17.0)
MCH: 32.8 pg (ref 26.0–34.0)
MCHC: 35 g/dL (ref 30.0–36.0)
MCV: 93.7 fL (ref 80.0–100.0)
Platelets: 110 10*3/uL — ABNORMAL LOW (ref 150–400)
RBC: 4.27 MIL/uL (ref 4.22–5.81)
RDW: 15.9 % — ABNORMAL HIGH (ref 11.5–15.5)
WBC: 7.9 10*3/uL (ref 4.0–10.5)
nRBC: 0 % (ref 0.0–0.2)

## 2021-03-27 LAB — COMPREHENSIVE METABOLIC PANEL
ALT: 29 U/L (ref 0–44)
AST: 62 U/L — ABNORMAL HIGH (ref 15–41)
Albumin: 2.3 g/dL — ABNORMAL LOW (ref 3.5–5.0)
Alkaline Phosphatase: 93 U/L (ref 38–126)
Anion gap: 4 — ABNORMAL LOW (ref 5–15)
BUN: 9 mg/dL (ref 6–20)
CO2: 26 mmol/L (ref 22–32)
Calcium: 8.8 mg/dL — ABNORMAL LOW (ref 8.9–10.3)
Chloride: 109 mmol/L (ref 98–111)
Creatinine, Ser: 0.7 mg/dL (ref 0.61–1.24)
GFR, Estimated: >60 mL/min (ref >60)
Glucose, Bld: 99 mg/dL (ref 70–99)
Potassium: 3.7 mmol/L (ref 3.5–5.1)
Sodium: 139 mmol/L (ref 135–145)
Total Bilirubin: 1.2 mg/dL (ref 0.3–1.2)
Total Protein: 5.9 g/dL — ABNORMAL LOW (ref 6.5–8.1)

## 2021-03-27 MED ORDER — LORAZEPAM 1 MG PO TABS
1.0000 mg | ORAL_TABLET | ORAL | Status: AC | PRN
  Administered 2021-03-27 – 2021-03-29 (×5): 1 mg via ORAL
  Administered 2021-03-29: 2 mg via ORAL
  Administered 2021-03-29: 1 mg via ORAL
  Administered 2021-03-29 – 2021-03-30 (×4): 2 mg via ORAL
  Filled 2021-03-27: qty 1
  Filled 2021-03-27 (×2): qty 2
  Filled 2021-03-27: qty 1
  Filled 2021-03-27: qty 2
  Filled 2021-03-27: qty 1
  Filled 2021-03-27: qty 2
  Filled 2021-03-27 (×3): qty 1
  Filled 2021-03-27: qty 2

## 2021-03-27 MED ORDER — LORAZEPAM 2 MG/ML IJ SOLN: 1.0000 mg | INTRAMUSCULAR | Status: AC | PRN

## 2021-03-27 NOTE — Progress Notes (Signed)
PROGRESS NOTE  Larry Adams WSF:[id number] DOB: 1985-10-06 DOA: 03/24/2021 PCP: Patient, No Pcp Per (Inactive)   LOS: 2 days   Brief Narrative / Interim history: Larry Adams is a 35 y.o. male with medical history significant for polysubstance abuse (heroin, alcohol, benzo) and depression who reports that his crisis counselor dropped him off in the ED last night since he was having a feeling of hopelessness and was drinking and using IV heroin. Noted by ED physician to be tremulous with tactile hallucination so hospitalist was called for admission for alcohol withdrawal. Pt reports having alcohol withdrawal seizures about a year ago  Subjective / 24h Interval events: Feels sleepy this morning, still shaky at times  Assessment & Plan: Principal Problem Alcohol withdrawal/polysubstance abuse -patient was admitted to progressive due to history of alcohol withdrawal seizures in the past, he is at high risk for DTs.  Last alcoholic drink was on Monday and he is at high risk for the first 72 hours. -Continue CIWA protocol  Active Problems Questionable acute COVID-19 viral infection Transient hypoxia while sleeping likely in the setting of sleep apnea, previously undiagnosed -he was placed Remdesivir, there was questionable respiratory symptoms at intake but unlikely due to COVID but more likely in the setting of alcohol withdrawal.  Chest x-ray was clear.  Stop Remdesivir after 3 doses   Elevated LFTs/total bili -Appear to be chronically elevated in the setting of heavy alcohol use/abuse -Thrombocytopenia, mild, ongoing, due to EtOH use -INR 1.6, consistent with liver dysfunction  Scheduled Meds:  Chlorhexidine Gluconate Cloth  6 each Topical Daily   enoxaparin (LOVENOX) injection  60 mg Subcutaneous Q24H   folic acid  1 mg Oral Daily   LORazepam  1 mg Oral Once   multivitamin with minerals  1 tablet Oral Daily   nicotine  21 mg Transdermal Daily   ondansetron  8 mg Oral Once    thiamine  100 mg Oral Daily   Continuous Infusions:  remdesivir 100 mg in NS 100 mL 100 mg (03/27/21 1034)   PRN Meds:.ibuprofen, LORazepam **OR** LORazepam, ondansetron  Diet Orders (From admission, onward)     Start     Ordered   03/24/21 1906  Diet regular Room service appropriate? Yes; Fluid consistency: Thin  Diet effective now       Question Answer Comment  Room service appropriate? Yes   Fluid consistency: Thin      03/24/21 1905            DVT prophylaxis: Place and maintain sequential compression device Start: 03/24/21 2219     Code Status: Full Code  Family Communication: No family at bedside  Status is: Inpatient  Remains inpatient appropriate because:Inpatient level of care appropriate due to severity of illness  Dispo: The patient is from: Home              Anticipated d/c is to: Home              Patient currently is not medically stable to d/c.   Difficult to place patient No   Level of care: Progressive  Consultants:  none  Procedures:  none  Microbiology  none  Antimicrobials: none    Objective: Vitals:   03/26/21 2010 03/26/21 2350 03/27/21 0436 03/27/21 1017  BP: (!) 148/89 135/77 (!) 148/98 (!) 144/98  Pulse: 79 65 68 82  Resp: 18 18 18 20   Temp: 98.1 F (36.7 C) 98.4 F (36.9 C) 98.7 F (37.1 C) 98.4 F (36.9 C)  TempSrc: Oral Oral Oral Oral  SpO2: 100% 98% 98% 99%  Weight:      Height:        Intake/Output Summary (Last 24 hours) at 03/27/2021 1312 Last data filed at 03/27/2021 8469 Gross per 24 hour  Intake 240 ml  Output 4450 ml  Net -4210 ml    Filed Weights   03/24/21 0124  Weight: 127 kg    Examination:  Constitutional: No apparent distress Eyes: Anicteric ENMT: mmm Neck: normal, supple Respiratory: Clear bilaterally, no wheezing Cardiovascular: Regular rate and rhythm, no edema Abdomen: Soft, NT, ND, bowel sounds positive Neurologic: No focal deficits  Data Reviewed: I have independently reviewed  following labs and imaging studies   CBC: Recent Labs  Lab 03/24/21 0133 03/24/21 1635 03/26/21 0233 03/27/21 0355  WBC 6.0 8.7 6.9 7.9  NEUTROABS 1.9 6.5  --   --   HGB 14.6 14.1 13.8 14.0  HCT 42.3 40.6 40.8 40.0  MCV 95.3 94.4 95.6 93.7  PLT 141* 129* 90* 110*    Basic Metabolic Panel: Recent Labs  Lab 03/24/21 0133 03/24/21 1635 03/25/21 0312 03/26/21 0233 03/27/21 0355  NA 137 138 136 134* 139  K 3.1* 3.8 3.4* 3.6 3.7  CL 102 105 106 103 109  CO2 25 25 26 26 26   GLUCOSE 102* 103* 102* 95 99  BUN <5* <5* 7 8 9   CREATININE 0.67 0.79 0.76 0.72 0.70  CALCIUM 8.5* 8.5* 8.0* 8.4* 8.8*    Liver Function Tests: Recent Labs  Lab 03/24/21 0133 03/24/21 1635 03/25/21 0312 03/26/21 0233 03/27/21 0355  AST 121* 120* 94* 77* 62*  ALT 50* 46* 39 34 29  ALKPHOS 110 100 86 93 93  BILITOT 1.9* 2.1* 2.2* 1.3* 1.2  PROT 7.1 6.6 6.0* 6.0* 5.9*  ALBUMIN 3.1* 2.7* 2.5* 2.3* 2.3*    Coagulation Profile: Recent Labs  Lab 03/25/21 0312  INR 1.6*    HbA1C: No results for input(s): HGBA1C in the last 72 hours. CBG: No results for input(s): GLUCAP in the last 168 hours.  Recent Results (from the past 240 hour(s))  Resp Panel by RT-PCR (Flu A&B, Covid) Nasopharyngeal Swab     Status: None   Collection Time: 03/24/21  1:33 AM   Specimen: Nasopharyngeal Swab; Nasopharyngeal(NP) swabs in vial transport medium  Result Value Ref Range Status   SARS Coronavirus 2 by RT PCR NEGATIVE NEGATIVE Final    Comment: (NOTE) SARS-CoV-2 target nucleic acids are NOT DETECTED.  The SARS-CoV-2 RNA is generally detectable in upper respiratory specimens during the acute phase of infection. The lowest concentration of SARS-CoV-2 viral copies this assay can detect is 138 copies/mL. A negative result does not preclude SARS-Cov-2 infection and should not be used as the sole basis for treatment or other patient management decisions. A negative result may occur with  improper specimen  collection/handling, submission of specimen other than nasopharyngeal swab, presence of viral mutation(s) within the areas targeted by this assay, and inadequate number of viral copies(<138 copies/mL). A negative result must be combined with clinical observations, patient history, and epidemiological information. The expected result is Negative.  Fact Sheet for Patients:  03/27/21  Fact Sheet for Healthcare Providers:  03/26/21  This test is no t yet approved or cleared by the [employer] FDA and  has been authorized for detection and/or diagnosis of SARS-CoV-2 by FDA under an Emergency Use Authorization (EUA). This EUA will remain  in effect (meaning this test can be used) for the duration  of the COVID-19 declaration under Section 564(b)(1) of the Act, 21 U.S.C.section 360bbb-3(b)(1), unless the authorization is terminated  or revoked sooner.       Influenza A by PCR NEGATIVE NEGATIVE Final   Influenza B by PCR NEGATIVE NEGATIVE Final    Comment: (NOTE) The Xpert Xpress SARS-CoV-2/FLU/RSV plus assay is intended as an aid in the diagnosis of influenza from Nasopharyngeal swab specimens and should not be used as a sole basis for treatment. Nasal washings and aspirates are unacceptable for Xpert Xpress SARS-CoV-2/FLU/RSV testing.  Fact Sheet for Patients: BloggerCourse.com  Fact Sheet for Healthcare Providers: SeriousBroker.it  This test is not yet approved or cleared by the [place] FDA and has been authorized for detection and/or diagnosis of SARS-CoV-2 by FDA under an Emergency Use Authorization (EUA). This EUA will remain in effect (meaning this test can be used) for the duration of the COVID-19 declaration under Section 564(b)(1) of the Act, 21 U.S.C. section 360bbb-3(b)(1), unless the authorization is terminated or revoked.  Performed at [facility], [address], [place], [place] [address]   Resp Panel by RT-PCR (Flu A&B, Covid) Nasopharyngeal Swab     Status: Abnormal   Collection Time: 03/24/21  4:36 PM   Specimen: Nasopharyngeal Swab; Nasopharyngeal(NP) swabs in vial transport medium  Result Value Ref Range Status   SARS Coronavirus 2 by RT PCR POSITIVE (A) NEGATIVE Final    Comment: RESULT CALLED TO, READ BACK BY AND VERIFIED WITH: CRUZ,K RN @2121  ON 03/24/21 JACKSON,K (NOTE) SARS-CoV-2 target nucleic acids are DETECTED.  The SARS-CoV-2 RNA is generally detectable in upper respiratory specimens during the acute phase of infection. Positive results are indicative of the presence of the identified virus, but do not rule out bacterial infection or co-infection with other pathogens not detected by the test. Clinical correlation with patient history and other diagnostic information is necessary to determine patient infection status. The expected result is Negative.  Fact Sheet for Patients: 03/26/21  Fact Sheet for Healthcare Providers: BloggerCourse.com  This test is not yet approved or cleared by the [employer] FDA and  has been authorized for detection and/or diagnosis of SARS-CoV-2 by FDA under an Emergency Use Authorization (EUA).  This EUA will remain in effect (meaning this test can  be used) for the duration of  the COVID-19 declaration under Section 564(b)(1) of the Act, 21 U.S.C. section 360bbb-3(b)(1), unless the authorization is terminated or revoked sooner.     Influenza A by PCR NEGATIVE NEGATIVE Final   Influenza B by PCR NEGATIVE NEGATIVE Final    Comment: (NOTE) The Xpert Xpress SARS-CoV-2/FLU/RSV plus assay is intended as an aid in the diagnosis of influenza from Nasopharyngeal swab specimens and should not be used as a sole basis for treatment. Nasal washings and aspirates are unacceptable for Xpert Xpress  SARS-CoV-2/FLU/RSV testing.  Fact Sheet for Patients: [place]  Fact Sheet for Healthcare Providers: BloggerCourse.com  This test is not yet approved or cleared by the [employer] FDA and has been authorized for detection and/or diagnosis of SARS-CoV-2 by FDA under an Emergency Use Authorization (EUA). This EUA will remain in effect (meaning this test can be used) for the duration of the COVID-19 declaration under Section 564(b)(1) of the Act, 21 U.S.C. section 360bbb-3(b)(1), unless the authorization is terminated or revoked.  Performed at [facility] , [address] [place], [place]   MRSA Next Gen by PCR, Nasal     Status: None   Collection Time:  03/24/21  8:44 PM   Specimen: Nasal Mucosa; Nasal Swab  Result Value Ref Range Status   MRSA by PCR Next Gen NOT DETECTED NOT DETECTED Final    Comment: (NOTE) The GeneXpert MRSA Assay (FDA approved for NASAL specimens only), is one component of a comprehensive MRSA colonization surveillance program. It is not intended to diagnose MRSA infection nor to guide or monitor treatment for MRSA infections. Test performance is not FDA approved in patients less than 22 years old. Performed at [facility], [address], [place], [place] [address]       Radiology Studies: No results found.   Pamella Pert, MD, PhD [facility]  Between 7 am - 7 pm I am available, please contact me via Amion (for emergencies) or Securechat (non urgent messages)  Between 7 pm - 7 am I am not available, please contact night coverage MD/APP via Amion

## 2021-03-28 LAB — COMPREHENSIVE METABOLIC PANEL
ALT: 33 U/L (ref 0–44)
AST: 75 U/L — ABNORMAL HIGH (ref 15–41)
Albumin: 2.5 g/dL — ABNORMAL LOW (ref 3.5–5.0)
Alkaline Phosphatase: 103 U/L (ref 38–126)
Anion gap: 6 (ref 5–15)
BUN: 10 mg/dL (ref 6–20)
CO2: 25 mmol/L (ref 22–32)
Calcium: 8.9 mg/dL (ref 8.9–10.3)
Chloride: 106 mmol/L (ref 98–111)
Creatinine, Ser: 0.8 mg/dL (ref 0.61–1.24)
GFR, Estimated: >60 mL/min (ref >60)
Glucose, Bld: 91 mg/dL (ref 70–99)
Potassium: 3.8 mmol/L (ref 3.5–5.1)
Sodium: 137 mmol/L (ref 135–145)
Total Bilirubin: 1.6 mg/dL — ABNORMAL HIGH (ref 0.3–1.2)
Total Protein: 6.5 g/dL (ref 6.5–8.1)

## 2021-03-28 LAB — CBC
HCT: 46.5 % (ref 39.0–52.0)
Hemoglobin: 15.9 g/dL (ref 13.0–17.0)
MCH: 32.4 pg (ref 26.0–34.0)
MCHC: 34.2 g/dL (ref 30.0–36.0)
MCV: 94.9 fL (ref 80.0–100.0)
Platelets: 121 10*3/uL — ABNORMAL LOW (ref 150–400)
RBC: 4.9 MIL/uL (ref 4.22–5.81)
RDW: 16.2 % — ABNORMAL HIGH (ref 11.5–15.5)
WBC: 8.9 10*3/uL (ref 4.0–10.5)
nRBC: 0 % (ref 0.0–0.2)

## 2021-03-28 NOTE — TOC Progression Note (Signed)
Transition of Care [facility]) - Progression Note    Patient Details  Name: Larry Adams MRN: [id number] Date of Birth: [birth year]  [facility] CM/SW Contact  Geni Bers, RN Phone Number: 03/28/2021, 1:32 PM  Clinical Narrative:    Spoke with pt concerning Homeless Shelter. Pt states he is goes to [facility]. A call to [facility] was made. They did not have pt's name. However, the standard is if pt is fully vaccinated x2 not booster, they may come in 5 days with no symptoms. If not vaccinated 10 to 14 days with no symptoms.    Expected Discharge Plan: Homeless Shelter Barriers to Discharge: Continued Medical Work up  Expected Discharge Plan and Services Expected Discharge Plan: Homeless Shelter   Discharge Planning Services: CM Consult, Indigent Health Clinic, [facility]   Living arrangements for the past 2 months: Homeless Shelter                                       Social Determinants of Health (SDOH) Interventions    Readmission Risk Interventions No flowsheet data found.

## 2021-03-28 NOTE — Progress Notes (Signed)
PROGRESS NOTE  Larry Adams PPI:[id number] DOB: [birth year] DOA: 03/24/2021 PCP: Patient, No Pcp Per (Inactive)   LOS: 3 days   Brief Narrative / Interim history: Larry Adams is a 35 y.o. male with medical history significant for polysubstance abuse (heroin, alcohol, benzo) and depression who reports that his crisis counselor dropped him off in the ED last night since he was having a feeling of hopelessness and was drinking and using IV heroin. Noted by ED physician to be tremulous with tactile hallucination so hospitalist was called for admission for alcohol withdrawal. Pt reports having alcohol withdrawal seizures about a year ago  Subjective / 24h Interval events: No apparent distress, complains of anxiety  Assessment & Plan: Principal Problem Alcohol withdrawal/polysubstance abuse -patient was admitted to progressive due to history of alcohol withdrawal seizures in the past, he is at high risk for DTs. -Withdrawal significantly improving, transferred from progressive to MedSurg -Continue CIWA protocol  Active Problems Questionable acute COVID-19 viral infection Transient hypoxia while sleeping likely in the setting of sleep apnea, previously undiagnosed -he was placed Remdesivir, there was questionable respiratory symptoms at intake but unlikely due to COVID but more likely in the setting of alcohol withdrawal.  Chest x-ray was clear.  Stopped Remdesivir after 3 doses -Remained stable on room air.  He tested positive on 9/26.  Elevated LFTs/total bili -Appear to be chronically elevated in the setting of heavy alcohol use/abuse -Thrombocytopenia, mild, ongoing, due to EtOH use  Disposition-stable for discharge at this juncture, however patient is homeless and lives in a shelter.  Discussed with the [facility] team to reach out to the shelter and find out when he can return due to COVID test being positive  Scheduled Meds:  Chlorhexidine Gluconate Cloth  6 each Topical Daily    enoxaparin (LOVENOX) injection  60 mg Subcutaneous Q24H   folic acid  1 mg Oral Daily   LORazepam  1 mg Oral Once   multivitamin with minerals  1 tablet Oral Daily   nicotine  21 mg Transdermal Daily   ondansetron  8 mg Oral Once   thiamine  100 mg Oral Daily   Continuous Infusions:   PRN Meds:.ibuprofen, LORazepam **OR** LORazepam, ondansetron  Diet Orders (From admission, onward)     Start     Ordered   03/24/21 1906  Diet regular Room service appropriate? Yes; Fluid consistency: Thin  Diet effective now       Question Answer Comment  Room service appropriate? Yes   Fluid consistency: Thin      03/24/21 1905            DVT prophylaxis: Place and maintain sequential compression device Start: 03/24/21 2219     Code Status: Full Code  Family Communication: No family at bedside  Status is: Inpatient  Remains inpatient appropriate because:Unsafe d/c plan  Dispo: The patient is from: Home              Anticipated d/c is to: Home              Patient currently is medically stable to d/c.   Difficult to place patient No   Level of care: Progressive  Consultants:  none  Procedures:  none  Microbiology  none  Antimicrobials: none    Objective: Vitals:   03/27/21 2304 03/28/21 0513 03/28/21 0747 03/28/21 1122  BP: 140/87 126/78 130/80 120/81  Pulse: 75 75 72 93  Resp: 18 18 18 20   Temp: 98.7 F (37.1 C) 98.2 F (36.8  C) 97.9 F (36.6 C) 98.4 F (36.9 C)  TempSrc: Oral Oral Axillary Oral  SpO2: 97% 100% 100% 96%  Weight:      Height:        Intake/Output Summary (Last 24 hours) at 03/28/2021 1137 Last data filed at 03/28/2021 1125 Gross per 24 hour  Intake 960 ml  Output 5400 ml  Net -4440 ml    Filed Weights   03/24/21 0124  Weight: 127 kg    Examination:  Constitutional: No distress Respiratory: Clear bilaterally, no wheezing Cardiovascular: Regular rate and rhythm  Data Reviewed: I have independently reviewed following labs and  imaging studies   CBC: Recent Labs  Lab 03/24/21 0133 03/24/21 1635 03/26/21 0233 03/27/21 0355 03/28/21 0413  WBC 6.0 8.7 6.9 7.9 8.9  NEUTROABS 1.9 6.5  --   --   --   HGB 14.6 14.1 13.8 14.0 15.9  HCT 42.3 40.6 40.8 40.0 46.5  MCV 95.3 94.4 95.6 93.7 94.9  PLT 141* 129* 90* 110* 121*    Basic Metabolic Panel: Recent Labs  Lab 03/24/21 1635 03/25/21 0312 03/26/21 0233 03/27/21 0355 03/28/21 0413  NA 138 136 134* 139 137  K 3.8 3.4* 3.6 3.7 3.8  CL 105 106 103 109 106  CO2 25 26 26 26 25   GLUCOSE 103* 102* 95 99 91  BUN <5* 7 8 9 10   CREATININE 0.79 0.76 0.72 0.70 0.80  CALCIUM 8.5* 8.0* 8.4* 8.8* 8.9    Liver Function Tests: Recent Labs  Lab 03/24/21 1635 03/25/21 0312 03/26/21 0233 03/27/21 0355 03/28/21 0413  AST 120* 94* 77* 62* 75*  ALT 46* 39 34 29 33  ALKPHOS 100 86 93 93 103  BILITOT 2.1* 2.2* 1.3* 1.2 1.6*  PROT 6.6 6.0* 6.0* 5.9* 6.5  ALBUMIN 2.7* 2.5* 2.3* 2.3* 2.5*    Coagulation Profile: Recent Labs  Lab 03/25/21 0312  INR 1.6*    HbA1C: No results for input(s): HGBA1C in the last 72 hours. CBG: No results for input(s): GLUCAP in the last 168 hours.  Recent Results (from the past 240 hour(s))  Resp Panel by RT-PCR (Flu A&B, Covid) Nasopharyngeal Swab     Status: None   Collection Time: 03/24/21  1:33 AM   Specimen: Nasopharyngeal Swab; Nasopharyngeal(NP) swabs in vial transport medium  Result Value Ref Range Status   SARS Coronavirus 2 by RT PCR NEGATIVE NEGATIVE Final    Comment: (NOTE) SARS-CoV-2 target nucleic acids are NOT DETECTED.  The SARS-CoV-2 RNA is generally detectable in upper respiratory specimens during the acute phase of infection. The lowest concentration of SARS-CoV-2 viral copies this assay can detect is 138 copies/mL. A negative result does not preclude SARS-Cov-2 infection and should not be used as the sole basis for treatment or other patient management decisions. A negative result may occur with   improper specimen collection/handling, submission of specimen other than nasopharyngeal swab, presence of viral mutation(s) within the areas targeted by this assay, and inadequate number of viral copies(<138 copies/mL). A negative result must be combined with clinical observations, patient history, and epidemiological information. The expected result is Negative.  Fact Sheet for Patients:  03/27/21  Fact Sheet for Healthcare Providers:  03/26/21  This test is no t yet approved or cleared by the [employer] FDA and  has been authorized for detection and/or diagnosis of SARS-CoV-2 by FDA under an Emergency Use Authorization (EUA). This EUA will remain  in effect (meaning this test can be used) for the duration of  the COVID-19 declaration under Section 564(b)(1) of the Act, 21 U.S.C.section 360bbb-3(b)(1), unless the authorization is terminated  or revoked sooner.       Influenza A by PCR NEGATIVE NEGATIVE Final   Influenza B by PCR NEGATIVE NEGATIVE Final    Comment: (NOTE) The Xpert Xpress SARS-CoV-2/FLU/RSV plus assay is intended as an aid in the diagnosis of influenza from Nasopharyngeal swab specimens and should not be used as a sole basis for treatment. Nasal washings and aspirates are unacceptable for Xpert Xpress SARS-CoV-2/FLU/RSV testing.  Fact Sheet for Patients: [employer]gerCourse.com  Fact Sheet for Healthcare Providers: SeriousBroker.it  This test is not yet approved or cleared by the [place] FDA and has been authorized for detection and/or diagnosis of SARS-CoV-2 by FDA under an Emergency Use Authorization (EUA). This EUA will remain in effect (meaning this test can be used) for the duration of the COVID-19 declaration under Section 564(b)(1) of the Act, 21 U.S.C. section 360bbb-3(b)(1), unless the authorization is terminated  or revoked.  Performed at [facility], [address], [place], [place] [address]   Resp Panel by RT-PCR (Flu A&B, Covid) Nasopharyngeal Swab     Status: Abnormal   Collection Time: 03/24/21  4:36 PM   Specimen: Nasopharyngeal Swab; Nasopharyngeal(NP) swabs in vial transport medium  Result Value Ref Range Status   SARS Coronavirus 2 by RT PCR POSITIVE (A) NEGATIVE Final    Comment: RESULT CALLED TO, READ BACK BY AND VERIFIED WITH: CRUZ,K RN @2121  ON 03/24/21 JACKSON,K (NOTE) SARS-CoV-2 target nucleic acids are DETECTED.  The SARS-CoV-2 RNA is generally detectable in upper respiratory specimens during the acute phase of infection. Positive results are indicative of the presence of the identified virus, but do not rule out bacterial infection or co-infection with other pathogens not detected by the test. Clinical correlation with patient history and other diagnostic information is necessary to determine patient infection status. The expected result is Negative.  Fact Sheet for Patients: 03/26/21  Fact Sheet for Healthcare Providers: [employer]gerCourse.com  This test is not yet approved or cleared by the [employer] FDA and  has been authorized for detection and/or diagnosis of SARS-CoV-2 by FDA under an Emergency Use Authorization (EUA).  This EUA will remain in effect (meaning this test can  be used) for the duration of  the COVID-19 declaration under Section 564(b)(1) of the Act, 21 U.S.C. section 360bbb-3(b)(1), unless the authorization is terminated or revoked sooner.     Influenza A by PCR NEGATIVE NEGATIVE Final   Influenza B by PCR NEGATIVE NEGATIVE Final    Comment: (NOTE) The Xpert Xpress SARS-CoV-2/FLU/RSV plus assay is intended as an aid in the diagnosis of influenza from Nasopharyngeal swab specimens and should not be used as a sole basis for treatment. Nasal washings and aspirates are  unacceptable for Xpert Xpress SARS-CoV-2/FLU/RSV testing.  Fact Sheet for Patients: [place]  Fact Sheet for Healthcare Providers: BloggerCourse.com  This test is not yet approved or cleared by the [employer] FDA and has been authorized for detection and/or diagnosis of SARS-CoV-2 by FDA under an Emergency Use Authorization (EUA). This EUA will remain in effect (meaning this test can be used) for the duration of the COVID-19 declaration under Section 564(b)(1) of the Act, 21 U.S.C. section 360bbb-3(b)(1), unless the authorization is terminated or revoked.  Performed at [facility], [address], [place], [place]   MRSA Next Gen by PCR, Nasal     Status: None   Collection Time: 03/24/21  8:44 PM   Specimen: Nasal Mucosa; Nasal Swab  Result Value Ref Range Status   MRSA by PCR Next Gen NOT DETECTED NOT DETECTED Final    Comment: (NOTE) The GeneXpert MRSA Assay (FDA approved for NASAL specimens only), is one component of a comprehensive MRSA colonization surveillance program. It is not intended to diagnose MRSA infection nor to guide or monitor treatment for MRSA infections. Test performance is not FDA approved in patients less than 50 years old. Performed at [facility], [address] [place], [place] [address]       Radiology Studies: No results found.   Pamella Pert, MD, PhD [facility]  Between 7 am - 7 pm I am available, please contact me via Amion (for emergencies) or Securechat (non urgent messages)  Between 7 pm - 7 am I am not available, please contact night coverage MD/APP via Amion

## 2021-03-29 DIAGNOSIS — F10939 Alcohol use, unspecified with withdrawal, unspecified: Secondary | ICD-10-CM

## 2021-03-29 LAB — CBC
HCT: 43.3 % (ref 39.0–52.0)
Hemoglobin: 14.8 g/dL (ref 13.0–17.0)
MCH: 32.6 pg (ref 26.0–34.0)
MCHC: 34.2 g/dL (ref 30.0–36.0)
MCV: 95.4 fL (ref 80.0–100.0)
Platelets: 136 10*3/uL — ABNORMAL LOW (ref 150–400)
RBC: 4.54 MIL/uL (ref 4.22–5.81)
RDW: 16.4 % — ABNORMAL HIGH (ref 11.5–15.5)
WBC: 6.9 10*3/uL (ref 4.0–10.5)
nRBC: 0 % (ref 0.0–0.2)

## 2021-03-29 LAB — COMPREHENSIVE METABOLIC PANEL
ALT: 30 U/L (ref 0–44)
AST: 71 U/L — ABNORMAL HIGH (ref 15–41)
Albumin: 2.5 g/dL — ABNORMAL LOW (ref 3.5–5.0)
Alkaline Phosphatase: 89 U/L (ref 38–126)
Anion gap: 4 — ABNORMAL LOW (ref 5–15)
BUN: 10 mg/dL (ref 6–20)
CO2: 25 mmol/L (ref 22–32)
Calcium: 8.8 mg/dL — ABNORMAL LOW (ref 8.9–10.3)
Chloride: 111 mmol/L (ref 98–111)
Creatinine, Ser: 0.73 mg/dL (ref 0.61–1.24)
GFR, Estimated: >60 mL/min (ref >60)
Glucose, Bld: 97 mg/dL (ref 70–99)
Potassium: 3.6 mmol/L (ref 3.5–5.1)
Sodium: 140 mmol/L (ref 135–145)
Total Bilirubin: 1.6 mg/dL — ABNORMAL HIGH (ref 0.3–1.2)
Total Protein: 6.3 g/dL — ABNORMAL LOW (ref 6.5–8.1)

## 2021-03-29 MED ORDER — BUSPIRONE HCL 5 MG PO TABS
5.0000 mg | ORAL_TABLET | Freq: Three times a day (TID) | ORAL | Status: DC
  Administered 2021-03-29 – 2021-04-01 (×9): 5 mg via ORAL
  Filled 2021-03-29 (×9): qty 1

## 2021-03-29 NOTE — Progress Notes (Signed)
PROGRESS NOTE  Kharee Lesesne XVQ:[id number] DOB: 1986/03/26 DOA: 03/24/2021 PCP: Patient, No Pcp Per (Inactive)   LOS: 4 days   Brief Narrative / Interim history: Wojciech Willetts is a 35 y.o. male with medical history significant for polysubstance abuse (heroin, alcohol, benzo) and depression who reports that his crisis counselor dropped him off in the ED last night since he was having a feeling of hopelessness and was drinking and using IV heroin. Noted by ED physician to be tremulous with tactile hallucination so hospitalist was called for admission for alcohol withdrawal. Pt reports having alcohol withdrawal seizures about a year ago  Subjective / 24h Interval events: Continues to complains of being anxious  Assessment & Plan: Principal Problem Alcohol withdrawal/polysubstance abuse -patient was admitted to progressive due to history of alcohol withdrawal seizures in the past, he is at high risk for DTs.  Clinically improving, remains anxious but his withdrawal symptoms are better and better  Active Problems Questionable acute COVID-19 viral infection Transient hypoxia while sleeping likely in the setting of sleep apnea, previously undiagnosed -he was placed Remdesivir, there was questionable respiratory symptoms at intake but unlikely due to COVID but more likely in the setting of alcohol withdrawal.  Chest x-ray was clear.  Stopped Remdesivir after 3 doses -Remained stable on room air.  He tested positive on 9/26, his shelter is unable to take him back currently, only after 10 days of isolation because he is not vaccinated (they require 5 days of isolation if you are vaccinated).  This would be this upcoming Thursday, 10/6  Elevated LFTs/total bili -Appear to be chronically elevated in the setting of heavy alcohol use/abuse -Thrombocytopenia, mild, ongoing, due to EtOH use  Disposition-stable for discharge at this juncture, however patient is homeless and lives in a shelter.  His  shelter will be able to accommodate him after 10 days of isolation on Thursday, 10/6  Scheduled Meds:  Chlorhexidine Gluconate Cloth  6 each Topical Daily   enoxaparin (LOVENOX) injection  60 mg Subcutaneous Q24H   folic acid  1 mg Oral Daily   LORazepam  1 mg Oral Once   multivitamin with minerals  1 tablet Oral Daily   nicotine  21 mg Transdermal Daily   ondansetron  8 mg Oral Once   thiamine  100 mg Oral Daily   Continuous Infusions:   PRN Meds:.ibuprofen, LORazepam **OR** LORazepam, ondansetron  Diet Orders (From admission, onward)     Start     Ordered   03/24/21 1906  Diet regular Room service appropriate? Yes; Fluid consistency: Thin  Diet effective now       Question Answer Comment  Room service appropriate? Yes   Fluid consistency: Thin      03/24/21 1905            DVT prophylaxis: Place and maintain sequential compression device Start: 03/24/21 2219     Code Status: Full Code  Family Communication: No family at bedside  Status is: Inpatient  Remains inpatient appropriate because:Unsafe d/c plan  Dispo: The patient is from: Home              Anticipated d/c is to: Home              Patient currently is medically stable to d/c.   Difficult to place patient No   Level of care: Med-Surg  Consultants:  none  Procedures:  none  Microbiology  none  Antimicrobials: none    Objective: Vitals:   03/29/21 1000 03/29/21  1006 03/29/21 1234 03/29/21 1359  BP: (!) 131/96 (!) 131/96 130/85 (!) 143/93  Pulse: 88 89 85 98  Resp:  16 16 18   Temp:  98.4 F (36.9 C) 98.3 F (36.8 C)   TempSrc:  Oral Axillary   SpO2:  100% 95% 97%  Weight:      Height:        Intake/Output Summary (Last 24 hours) at 03/29/2021 1401 Last data filed at 03/29/2021 1235 Gross per 24 hour  Intake --  Output 4000 ml  Net -4000 ml    Filed Weights   03/24/21 0124  Weight: 127 kg    Examination:  Constitutional: NAD Respiratory: CTA Cardiovascular:  RRR  Data Reviewed: I have independently reviewed following labs and imaging studies   CBC: Recent Labs  Lab 03/24/21 0133 03/24/21 1635 03/26/21 0233 03/27/21 0355 03/28/21 0413 03/29/21 0706  WBC 6.0 8.7 6.9 7.9 8.9 6.9  NEUTROABS 1.9 6.5  --   --   --   --   HGB 14.6 14.1 13.8 14.0 15.9 14.8  HCT 42.3 40.6 40.8 40.0 46.5 43.3  MCV 95.3 94.4 95.6 93.7 94.9 95.4  PLT 141* 129* 90* 110* 121* 136*    Basic Metabolic Panel: Recent Labs  Lab 03/25/21 0312 03/26/21 0233 03/27/21 0355 03/28/21 0413 03/29/21 0706  NA 136 134* 139 137 140  K 3.4* 3.6 3.7 3.8 3.6  CL 106 103 109 106 111  CO2 26 26 26 25 25   GLUCOSE 102* 95 99 91 97  BUN 7 8 9 10 10   CREATININE 0.76 0.72 0.70 0.80 0.73  CALCIUM 8.0* 8.4* 8.8* 8.9 8.8*    Liver Function Tests: Recent Labs  Lab 03/25/21 0312 03/26/21 0233 03/27/21 0355 03/28/21 0413 03/29/21 0706  AST 94* 77* 62* 75* 71*  ALT 39 34 29 33 30  ALKPHOS 86 93 93 103 89  BILITOT 2.2* 1.3* 1.2 1.6* 1.6*  PROT 6.0* 6.0* 5.9* 6.5 6.3*  ALBUMIN 2.5* 2.3* 2.3* 2.5* 2.5*    Coagulation Profile: Recent Labs  Lab 03/25/21 0312  INR 1.6*    HbA1C: No results for input(s): HGBA1C in the last 72 hours. CBG: No results for input(s): GLUCAP in the last 168 hours.  Recent Results (from the past 240 hour(s))  Resp Panel by RT-PCR (Flu A&B, Covid) Nasopharyngeal Swab     Status: None   Collection Time: 03/24/21  1:33 AM   Specimen: Nasopharyngeal Swab; Nasopharyngeal(NP) swabs in vial transport medium  Result Value Ref Range Status   SARS Coronavirus 2 by RT PCR NEGATIVE NEGATIVE Final    Comment: (NOTE) SARS-CoV-2 target nucleic acids are NOT DETECTED.  The SARS-CoV-2 RNA is generally detectable in upper respiratory specimens during the acute phase of infection. The lowest concentration of SARS-CoV-2 viral copies this assay can detect is 138 copies/mL. A negative result does not preclude SARS-Cov-2 infection and should not be used as  the sole basis for treatment or other patient management decisions. A negative result may occur with  improper specimen collection/handling, submission of specimen other than nasopharyngeal swab, presence of viral mutation(s) within the areas targeted by this assay, and inadequate number of viral copies(<138 copies/mL). A negative result must be combined with clinical observations, patient history, and epidemiological information. The expected result is Negative.  Fact Sheet for Patients:  05/29/21  Fact Sheet for Healthcare Providers:  03/27/21  This test is no t yet approved or cleared by the 03/26/21 and  has been authorized  for detection and/or diagnosis of SARS-CoV-2 by FDA under an Emergency Use Authorization (EUA). This EUA will remain  in effect (meaning this test can be used) for the duration of the COVID-19 declaration under Section 564(b)(1) of the Act, 21 U.S.C.section 360bbb-3(b)(1), unless the authorization is terminated  or revoked sooner.       Influenza A by PCR NEGATIVE NEGATIVE Final   Influenza B by PCR NEGATIVE NEGATIVE Final    Comment: (NOTE) The Xpert Xpress SARS-CoV-2/FLU/RSV plus assay is intended as an aid in the diagnosis of influenza from Nasopharyngeal swab specimens and should not be used as a sole basis for treatment. Nasal washings and aspirates are unacceptable for Xpert Xpress SARS-CoV-2/FLU/RSV testing.  Fact Sheet for Patients: BloggerCourse.com  Fact Sheet for Healthcare Providers: SeriousBroker.it  This test is not yet approved or cleared by the [place] FDA and has been authorized for detection and/or diagnosis of SARS-CoV-2 by FDA under an Emergency Use Authorization (EUA). This EUA will remain in effect (meaning this test can be used) for the duration of the COVID-19 declaration under Section 564(b)(1) of  the Act, 21 U.S.C. section 360bbb-3(b)(1), unless the authorization is terminated or revoked.  Performed at [facility], [address], [place], [place] [address]   Resp Panel by RT-PCR (Flu A&B, Covid) Nasopharyngeal Swab     Status: Abnormal   Collection Time: 03/24/21  4:36 PM   Specimen: Nasopharyngeal Swab; Nasopharyngeal(NP) swabs in vial transport medium  Result Value Ref Range Status   SARS Coronavirus 2 by RT PCR POSITIVE (A) NEGATIVE Final    Comment: RESULT CALLED TO, READ BACK BY AND VERIFIED WITH: CRUZ,K RN @2121  ON 03/24/21 JACKSON,K (NOTE) SARS-CoV-2 target nucleic acids are DETECTED.  The SARS-CoV-2 RNA is generally detectable in upper respiratory specimens during the acute phase of infection. Positive results are indicative of the presence of the identified virus, but do not rule out bacterial infection or co-infection with other pathogens not detected by the test. Clinical correlation with patient history and other diagnostic information is necessary to determine patient infection status. The expected result is Negative.  Fact Sheet for Patients: 03/26/21  Fact Sheet for Healthcare Providers: [employer]Course.com  This test is not yet approved or cleared by the [employer] FDA and  has been authorized for detection and/or diagnosis of SARS-CoV-2 by FDA under an Emergency Use Authorization (EUA).  This EUA will remain in effect (meaning this test can  be used) for the duration of  the COVID-19 declaration under Section 564(b)(1) of the Act, 21 U.S.C. section 360bbb-3(b)(1), unless the authorization is terminated or revoked sooner.     Influenza A by PCR NEGATIVE NEGATIVE Final   Influenza B by PCR NEGATIVE NEGATIVE Final    Comment: (NOTE) The Xpert Xpress SARS-CoV-2/FLU/RSV plus assay is intended as an aid in the diagnosis of influenza from Nasopharyngeal swab specimens  and should not be used as a sole basis for treatment. Nasal washings and aspirates are unacceptable for Xpert Xpress SARS-CoV-2/FLU/RSV testing.  Fact Sheet for Patients: [place]  Fact Sheet for Healthcare Providers: [employer]gerCourse.com  This test is not yet approved or cleared by the [employer] FDA and has been authorized for detection and/or diagnosis of SARS-CoV-2 by FDA under an Emergency Use Authorization (EUA). This EUA will remain in effect (meaning this test can be used) for the duration of the COVID-19 declaration under Section 564(b)(1) of the Act, 21 U.S.C. section 360bbb-3(b)(1), unless the authorization is terminated or revoked.  Performed  at [facility], [address], [place], [place] [address]   MRSA Next Gen by PCR, Nasal     Status: None   Collection Time: 03/24/21  8:44 PM   Specimen: Nasal Mucosa; Nasal Swab  Result Value Ref Range Status   MRSA by PCR Next Gen NOT DETECTED NOT DETECTED Final    Comment: (NOTE) The GeneXpert MRSA Assay (FDA approved for NASAL specimens only), is one component of a comprehensive MRSA colonization surveillance program. It is not intended to diagnose MRSA infection nor to guide or monitor treatment for MRSA infections. Test performance is not FDA approved in patients less than 45 years old. Performed at [facility], [address] [place], [place] [address]       Radiology Studies: No results found.   Pamella Pert, MD, PhD [facility]  Between 7 am - 7 pm I am available, please contact me via Amion (for emergencies) or Securechat (non urgent messages)  Between 7 pm - 7 am I am not available, please contact night coverage MD/APP via Amion

## 2021-03-30 LAB — CBC
HCT: 44.7 % (ref 39.0–52.0)
Hemoglobin: 14.9 g/dL (ref 13.0–17.0)
MCH: 32.4 pg (ref 26.0–34.0)
MCHC: 33.3 g/dL (ref 30.0–36.0)
MCV: 97.2 fL (ref 80.0–100.0)
Platelets: 187 10*3/uL (ref 150–400)
RBC: 4.6 MIL/uL (ref 4.22–5.81)
RDW: 16.4 % — ABNORMAL HIGH (ref 11.5–15.5)
WBC: 8.4 10*3/uL (ref 4.0–10.5)
nRBC: 0 % (ref 0.0–0.2)

## 2021-03-30 LAB — COMPREHENSIVE METABOLIC PANEL
ALT: 46 U/L — ABNORMAL HIGH (ref 0–44)
AST: 113 U/L — ABNORMAL HIGH (ref 15–41)
Albumin: 2.6 g/dL — ABNORMAL LOW (ref 3.5–5.0)
Alkaline Phosphatase: 91 U/L (ref 38–126)
Anion gap: 3 — ABNORMAL LOW (ref 5–15)
BUN: 10 mg/dL (ref 6–20)
CO2: 28 mmol/L (ref 22–32)
Calcium: 8.9 mg/dL (ref 8.9–10.3)
Chloride: 109 mmol/L (ref 98–111)
Creatinine, Ser: 0.78 mg/dL (ref 0.61–1.24)
GFR, Estimated: >60 mL/min (ref >60)
Glucose, Bld: 95 mg/dL (ref 70–99)
Potassium: 3.3 mmol/L — ABNORMAL LOW (ref 3.5–5.1)
Sodium: 140 mmol/L (ref 135–145)
Total Bilirubin: 1.4 mg/dL — ABNORMAL HIGH (ref 0.3–1.2)
Total Protein: 6.8 g/dL (ref 6.5–8.1)

## 2021-03-30 MED ORDER — POTASSIUM CHLORIDE CRYS ER 20 MEQ PO TBCR
40.0000 meq | EXTENDED_RELEASE_TABLET | Freq: Once | ORAL | Status: AC
  Administered 2021-03-30: 40 meq via ORAL
  Filled 2021-03-30: qty 2

## 2021-03-30 NOTE — Progress Notes (Signed)
PROGRESS NOTE  Larry Adams QPY:[id number] DOB: 27-Nov-1985 DOA: 03/24/2021 PCP: Patient, No Pcp Per (Inactive)   LOS: 5 days   Brief Narrative / Interim history: Larry Adams is a 35 y.o. male with medical history significant for polysubstance abuse (heroin, alcohol, benzo) and depression who reports that his crisis counselor dropped him off in the ED last night since he was having a feeling of hopelessness and was drinking and using IV heroin. Noted by ED physician to be tremulous with tactile hallucination so hospitalist was called for admission for alcohol withdrawal. Pt reports having alcohol withdrawal seizures about a year ago  Subjective / 24h Interval events: Anxiety is better  Assessment & Plan: Principal Problem Alcohol withdrawal/polysubstance abuse /anxiety-patient was admitted to the hospital due to history of alcohol withdrawal seizures in the past, he is at high risk for DTs.  He was monitored, improving.  For his anxiety he was added on BuSpar in addition to as needed Ativan with improvement.  Active Problems Questionable acute COVID-19 viral infection Transient hypoxia while sleeping likely in the setting of sleep apnea, previously undiagnosed -he was placed Remdesivir, there was questionable respiratory symptoms at intake but unlikely due to COVID but more likely in the setting of alcohol withdrawal.  Chest x-ray was clear.  Stopped Remdesivir after 3 doses -Remained stable on room air.  He tested positive on 9/26, his shelter is unable to take him back currently, only after 10 days of isolation because he is not vaccinated (they require 5 days of isolation if you are vaccinated).  This would be this upcoming Thursday, 10/6  Elevated LFTs/total bili -Appear to be chronically elevated in the setting of heavy alcohol use/abuse -Thrombocytopenia, mild, ongoing, due to EtOH use  Disposition-stable for discharge at this juncture, however patient is homeless and lives in a  shelter.  His shelter will be able to accommodate him after 10 days of isolation on Thursday, 10/6  Scheduled Meds:  busPIRone  5 mg Oral TID   enoxaparin (LOVENOX) injection  60 mg Subcutaneous Q24H   folic acid  1 mg Oral Daily   LORazepam  1 mg Oral Once   multivitamin with minerals  1 tablet Oral Daily   nicotine  21 mg Transdermal Daily   ondansetron  8 mg Oral Once   potassium chloride  40 mEq Oral Once   thiamine  100 mg Oral Daily   Continuous Infusions:   PRN Meds:.ibuprofen, LORazepam **OR** LORazepam, ondansetron  Diet Orders (From admission, onward)     Start     Ordered   03/24/21 1906  Diet regular Room service appropriate? Yes; Fluid consistency: Thin  Diet effective now       Question Answer Comment  Room service appropriate? Yes   Fluid consistency: Thin      03/24/21 1905            DVT prophylaxis: Place and maintain sequential compression device Start: 03/24/21 2219     Code Status: Full Code  Family Communication: No family at bedside  Status is: Inpatient  Remains inpatient appropriate because:Unsafe d/c plan  Dispo: The patient is from: Home              Anticipated d/c is to: Home              Patient currently is medically stable to d/c.   Difficult to place patient No   Level of care: Med-Surg  Consultants:  none  Procedures:  none  Microbiology  none  Antimicrobials: none    Objective: Vitals:   03/29/21 1339 03/29/21 1359 03/29/21 2132 03/30/21 0430  BP: (!) 142/80 (!) 143/93 123/82 132/81  Pulse:  98 (!) 110 93  Resp:  18 18 18   Temp:   99 F (37.2 C) 98.6 F (37 C)  TempSrc:   Oral Oral  SpO2:  97% 100% 97%  Weight:      Height:        Intake/Output Summary (Last 24 hours) at 03/30/2021 1128 Last data filed at 03/30/2021 0919 Gross per 24 hour  Intake 840 ml  Output 4250 ml  Net -3410 ml    Filed Weights   03/24/21 0124  Weight: 127 kg    Examination:  Constitutional: nad Respiratory:  cta Cardiovascular: rrr  Data Reviewed: I have independently reviewed following labs and imaging studies   CBC: Recent Labs  Lab 03/24/21 0133 03/24/21 1635 03/26/21 0233 03/27/21 0355 03/28/21 0413 03/29/21 0706 03/30/21 0419  WBC 6.0 8.7 6.9 7.9 8.9 6.9 8.4  NEUTROABS 1.9 6.5  --   --   --   --   --   HGB 14.6 14.1 13.8 14.0 15.9 14.8 14.9  HCT 42.3 40.6 40.8 40.0 46.5 43.3 44.7  MCV 95.3 94.4 95.6 93.7 94.9 95.4 97.2  PLT 141* 129* 90* 110* 121* 136* 187    Basic Metabolic Panel: Recent Labs  Lab 03/26/21 0233 03/27/21 0355 03/28/21 0413 03/29/21 0706 03/30/21 0419  NA 134* 139 137 140 140  K 3.6 3.7 3.8 3.6 3.3*  CL 103 109 106 111 109  CO2 26 26 25 25 28   GLUCOSE 95 99 91 97 95  BUN 8 9 10 10 10   CREATININE 0.72 0.70 0.80 0.73 0.78  CALCIUM 8.4* 8.8* 8.9 8.8* 8.9    Liver Function Tests: Recent Labs  Lab 03/26/21 0233 03/27/21 0355 03/28/21 0413 03/29/21 0706 03/30/21 0419  AST 77* 62* 75* 71* 113*  ALT 34 29 33 30 46*  ALKPHOS 93 93 103 89 91  BILITOT 1.3* 1.2 1.6* 1.6* 1.4*  PROT 6.0* 5.9* 6.5 6.3* 6.8  ALBUMIN 2.3* 2.3* 2.5* 2.5* 2.6*    Coagulation Profile: Recent Labs  Lab 03/25/21 0312  INR 1.6*    HbA1C: No results for input(s): HGBA1C in the last 72 hours. CBG: No results for input(s): GLUCAP in the last 168 hours.  Recent Results (from the past 240 hour(s))  Resp Panel by RT-PCR (Flu A&B, Covid) Nasopharyngeal Swab     Status: None   Collection Time: 03/24/21  1:33 AM   Specimen: Nasopharyngeal Swab; Nasopharyngeal(NP) swabs in vial transport medium  Result Value Ref Range Status   SARS Coronavirus 2 by RT PCR NEGATIVE NEGATIVE Final    Comment: (NOTE) SARS-CoV-2 target nucleic acids are NOT DETECTED.  The SARS-CoV-2 RNA is generally detectable in upper respiratory specimens during the acute phase of infection. The lowest concentration of SARS-CoV-2 viral copies this assay can detect is 138 copies/mL. A negative result  does not preclude SARS-Cov-2 infection and should not be used as the sole basis for treatment or other patient management decisions. A negative result may occur with  improper specimen collection/handling, submission of specimen other than nasopharyngeal swab, presence of viral mutation(s) within the areas targeted by this assay, and inadequate number of viral copies(<138 copies/mL). A negative result must be combined with clinical observations, patient history, and epidemiological information. The expected result is Negative.  Fact Sheet for Patients:  05/30/21  Fact Sheet  for Healthcare Providers:  SeriousBroker.it  This test is no t yet approved or cleared by the [place] and  has been authorized for detection and/or diagnosis of SARS-CoV-2 by FDA under an Emergency Use Authorization (EUA). This EUA will remain  in effect (meaning this test can be used) for the duration of the COVID-19 declaration under Section 564(b)(1) of the Act, 21 U.S.C.section 360bbb-3(b)(1), unless the authorization is terminated  or revoked sooner.       Influenza A by PCR NEGATIVE NEGATIVE Final   Influenza B by PCR NEGATIVE NEGATIVE Final    Comment: (NOTE) The Xpert Xpress SARS-CoV-2/FLU/RSV plus assay is intended as an aid in the diagnosis of influenza from Nasopharyngeal swab specimens and should not be used as a sole basis for treatment. Nasal washings and aspirates are unacceptable for Xpert Xpress SARS-CoV-2/FLU/RSV testing.  Fact Sheet for Patients: BloggerCourse.com  Fact Sheet for Healthcare Providers: SeriousBroker.it  This test is not yet approved or cleared by the [place] FDA and has been authorized for detection and/or diagnosis of SARS-CoV-2 by FDA under an Emergency Use Authorization (EUA). This EUA will remain in effect (meaning this test can be used) for  the duration of the COVID-19 declaration under Section 564(b)(1) of the Act, 21 U.S.C. section 360bbb-3(b)(1), unless the authorization is terminated or revoked.  Performed at [facility], [address] [place], [place] [address]   Resp Panel by RT-PCR (Flu A&B, Covid) Nasopharyngeal Swab     Status: Abnormal   Collection Time: 03/24/21  4:36 PM   Specimen: Nasopharyngeal Swab; Nasopharyngeal(NP) swabs in vial transport medium  Result Value Ref Range Status   SARS Coronavirus 2 by RT PCR POSITIVE (A) NEGATIVE Final    Comment: RESULT CALLED TO, READ BACK BY AND VERIFIED WITH: CRUZ,K RN @2121  ON 03/24/21 JACKSON,K (NOTE) SARS-CoV-2 target nucleic acids are DETECTED.  The SARS-CoV-2 RNA is generally detectable in upper respiratory specimens during the acute phase of infection. Positive results are indicative of the presence of the identified virus, but do not rule out bacterial infection or co-infection with other pathogens not detected by the test. Clinical correlation with patient history and other diagnostic information is necessary to determine patient infection status. The expected result is Negative.  Fact Sheet for Patients: 03/26/21  Fact Sheet for Healthcare Providers: BloggerCourse.com  This test is not yet approved or cleared by the Serious[employer].[place] FDA and  has been authorized for detection and/or diagnosis of SARS-CoV-2 by FDA under an Emergency Use Authorization (EUA).  This EUA will remain in effect (meaning this test can  be used) for the duration of  the COVID-19 declaration under Section 564(b)(1) of the Act, 21 U.S.C. section 360bbb-3(b)(1), unless the authorization is terminated or revoked sooner.     Influenza A by PCR NEGATIVE NEGATIVE Final   Influenza B by PCR NEGATIVE NEGATIVE Final    Comment: (NOTE) The Xpert Xpress SARS-CoV-2/FLU/RSV plus assay is intended as an aid in the  diagnosis of influenza from Nasopharyngeal swab specimens and should not be used as a sole basis for treatment. Nasal washings and aspirates are unacceptable for Xpert Xpress SARS-CoV-2/FLU/RSV testing.  Fact Sheet for Patients: [place]  Fact Sheet for Healthcare Providers: BloggerCourse.com  This test is not yet approved or cleared by the [employer]ious[employer].[place] FDA and has been authorized for detection and/or diagnosis of SARS-CoV-2 by FDA under an Emergency Use Authorization (EUA). This EUA will remain in effect (meaning this test can be used) for  the duration of the COVID-19 declaration under Section 564(b)(1) of the Act, 21 U.S.C. section 360bbb-3(b)(1), unless the authorization is terminated or revoked.  Performed at [facility], [address], [place], [place] [address]   MRSA Next Gen by PCR, Nasal     Status: None   Collection Time: 03/24/21  8:44 PM   Specimen: Nasal Mucosa; Nasal Swab  Result Value Ref Range Status   MRSA by PCR Next Gen NOT DETECTED NOT DETECTED Final    Comment: (NOTE) The GeneXpert MRSA Assay (FDA approved for NASAL specimens only), is one component of a comprehensive MRSA colonization surveillance program. It is not intended to diagnose MRSA infection nor to guide or monitor treatment for MRSA infections. Test performance is not FDA approved in patients less than 77 years old. Performed at [facility], [address] [place], [place] [address]       Radiology Studies: No results found.   Pamella Pert, MD, PhD [facility]  Between 7 am - 7 pm I am available, please contact me via Amion (for emergencies) or Securechat (non urgent messages)  Between 7 pm - 7 am I am not available, please contact night coverage MD/APP via Amion

## 2021-03-31 MED ORDER — LORAZEPAM 1 MG PO TABS
1.0000 mg | ORAL_TABLET | Freq: Three times a day (TID) | ORAL | Status: DC | PRN
  Administered 2021-03-31 – 2021-04-01 (×3): 1 mg via ORAL
  Filled 2021-03-31 (×3): qty 1

## 2021-03-31 NOTE — Progress Notes (Signed)
PROGRESS NOTE  Bralin Garry SPQ:[id number] DOB: [birth year] DOA: 03/24/2021 PCP: Patient, No Pcp Per (Inactive)   LOS: 6 days   Brief Narrative / Interim history: Duglas Heier is a 35 y.o. male with medical history significant for polysubstance abuse (heroin, alcohol, benzo) and depression who reports that his crisis counselor dropped him off in the ED last night since he was having a feeling of hopelessness and was drinking and using IV heroin. Noted by ED physician to be tremulous with tactile hallucination so hospitalist was called for admission for alcohol withdrawal. Pt reports having alcohol withdrawal seizures about a year ago  Subjective / 24h Interval events: Continues to complain of anxiety  Assessment & Plan: Principal Problem Alcohol withdrawal/polysubstance abuse /anxiety-patient was admitted to the hospital due to history of alcohol withdrawal seizures in the past, he is at high risk for DTs.  He was monitored, improving.  For his anxiety he was added on BuSpar in addition to as needed Ativan with improvement.  Active Problems Questionable acute COVID-19 viral infection Transient hypoxia while sleeping likely in the setting of sleep apnea, previously undiagnosed -he was placed Remdesivir, there was questionable respiratory symptoms at intake but unlikely due to COVID but more likely in the setting of alcohol withdrawal.  Chest x-ray was clear.  Stopped Remdesivir after 3 doses -Remained stable on room air.  He tested positive on 9/26, his shelter is unable to take him back currently, only after 10 days of isolation because he is not vaccinated (they require 5 days of isolation if you are vaccinated).  This would be this upcoming Thursday, 10/6  Elevated LFTs/total bili -Appear to be chronically elevated in the setting of heavy alcohol use/abuse -Thrombocytopenia, mild, ongoing, due to EtOH use  Disposition-stable for discharge at this juncture, however patient is homeless  and lives in a shelter.  His shelter will be able to accommodate him after 10 days of isolation on Thursday, 10/6  Scheduled Meds:  busPIRone  5 mg Oral TID   enoxaparin (LOVENOX) injection  60 mg Subcutaneous Q24H   folic acid  1 mg Oral Daily   LORazepam  1 mg Oral Once   multivitamin with minerals  1 tablet Oral Daily   nicotine  21 mg Transdermal Daily   ondansetron  8 mg Oral Once   thiamine  100 mg Oral Daily   Continuous Infusions:  PRN Meds:.ibuprofen, LORazepam, ondansetron  Diet Orders (From admission, onward)     Start     Ordered   03/24/21 1906  Diet regular Room service appropriate? Yes; Fluid consistency: Thin  Diet effective now       Question Answer Comment  Room service appropriate? Yes   Fluid consistency: Thin      03/24/21 1905            DVT prophylaxis: Place and maintain sequential compression device Start: 03/24/21 2219     Code Status: Full Code  Family Communication: No family at bedside  Status is: Inpatient  Remains inpatient appropriate because:Unsafe d/c plan  Dispo: The patient is from: Home              Anticipated d/c is to: Home              Patient currently is medically stable to d/c.   Difficult to place patient No   Level of care: Med-Surg  Consultants:  none  Procedures:  none  Microbiology  none  Antimicrobials: none    Objective: Vitals:  03/29/21 2132 03/30/21 0430 03/30/21 1326 03/31/21 0626  BP: 123/82 132/81 (!) 121/92 123/75  Pulse: (!) 110 93 82 78  Resp: 18 18 16 20   Temp: 99 F (37.2 C) 98.6 F (37 C)  98.2 F (36.8 C)  TempSrc: Oral Oral  Oral  SpO2: 100% 97% 100% 98%  Weight:      Height:        Intake/Output Summary (Last 24 hours) at 03/31/2021 1113 Last data filed at 03/31/2021 1100 Gross per 24 hour  Intake 600 ml  Output 2850 ml  Net -2250 ml    Filed Weights   03/24/21 0124  Weight: 127 kg    Examination:  Constitutional: nad Respiratory: CTA Cardiovascular:  RRR  Data Reviewed: I have independently reviewed following labs and imaging studies   CBC: Recent Labs  Lab 03/24/21 1635 03/26/21 0233 03/27/21 0355 03/28/21 0413 03/29/21 0706 03/30/21 0419  WBC 8.7 6.9 7.9 8.9 6.9 8.4  NEUTROABS 6.5  --   --   --   --   --   HGB 14.1 13.8 14.0 15.9 14.8 14.9  HCT 40.6 40.8 40.0 46.5 43.3 44.7  MCV 94.4 95.6 93.7 94.9 95.4 97.2  PLT 129* 90* 110* 121* 136* 187    Basic Metabolic Panel: Recent Labs  Lab 03/26/21 0233 03/27/21 0355 03/28/21 0413 03/29/21 0706 03/30/21 0419  NA 134* 139 137 140 140  K 3.6 3.7 3.8 3.6 3.3*  CL 103 109 106 111 109  CO2 26 26 25 25 28   GLUCOSE 95 99 91 97 95  BUN 8 9 10 10 10   CREATININE 0.72 0.70 0.80 0.73 0.78  CALCIUM 8.4* 8.8* 8.9 8.8* 8.9    Liver Function Tests: Recent Labs  Lab 03/26/21 0233 03/27/21 0355 03/28/21 0413 03/29/21 0706 03/30/21 0419  AST 77* 62* 75* 71* 113*  ALT 34 29 33 30 46*  ALKPHOS 93 93 103 89 91  BILITOT 1.3* 1.2 1.6* 1.6* 1.4*  PROT 6.0* 5.9* 6.5 6.3* 6.8  ALBUMIN 2.3* 2.3* 2.5* 2.5* 2.6*    Coagulation Profile: Recent Labs  Lab 03/25/21 0312  INR 1.6*    HbA1C: No results for input(s): HGBA1C in the last 72 hours. CBG: No results for input(s): GLUCAP in the last 168 hours.  Recent Results (from the past 240 hour(s))  Resp Panel by RT-PCR (Flu A&B, Covid) Nasopharyngeal Swab     Status: None   Collection Time: 03/24/21  1:33 AM   Specimen: Nasopharyngeal Swab; Nasopharyngeal(NP) swabs in vial transport medium  Result Value Ref Range Status   SARS Coronavirus 2 by RT PCR NEGATIVE NEGATIVE Final    Comment: (NOTE) SARS-CoV-2 target nucleic acids are NOT DETECTED.  The SARS-CoV-2 RNA is generally detectable in upper respiratory specimens during the acute phase of infection. The lowest concentration of SARS-CoV-2 viral copies this assay can detect is 138 copies/mL. A negative result does not preclude SARS-Cov-2 infection and should not be used as  the sole basis for treatment or other patient management decisions. A negative result may occur with  improper specimen collection/handling, submission of specimen other than nasopharyngeal swab, presence of viral mutation(s) within the areas targeted by this assay, and inadequate number of viral copies(<138 copies/mL). A negative result must be combined with clinical observations, patient history, and epidemiological information. The expected result is Negative.  Fact Sheet for Patients:  05/30/21  Fact Sheet for Healthcare Providers:  03/27/21  This test is no t yet approved or cleared by the  [place] [employer] and  has been authorized for detection and/or diagnosis of SARS-CoV-2 by FDA under an TEFL teacher (EUA). This EUA will remain  in effect (meaning this test can be used) for the duration of the COVID-19 declaration under Section 564(b)(1) of the Act, 21 U.S.C.section 360bbb-3(b)(1), unless the authorization is terminated  or revoked sooner.       Influenza A by PCR NEGATIVE NEGATIVE Final   Influenza B by PCR NEGATIVE NEGATIVE Final    Comment: (NOTE) The Xpert Xpress SARS-CoV-2/FLU/RSV plus assay is intended as an aid in the diagnosis of influenza from Nasopharyngeal swab specimens and should not be used as a sole basis for treatment. Nasal washings and aspirates are unacceptable for Xpert Xpress SARS-CoV-2/FLU/RSV testing.  Fact Sheet for Patients: BloggerCourse.com  Fact Sheet for Healthcare Providers: SeriousBroker.it  This test is not yet approved or cleared by the [place] FDA and has been authorized for detection and/or diagnosis of SARS-CoV-2 by FDA under an Emergency Use Authorization (EUA). This EUA will remain in effect (meaning this test can be used) for the duration of the COVID-19 declaration under Section 564(b)(1) of  the Act, 21 U.S.C. section 360bbb-3(b)(1), unless the authorization is terminated or revoked.  Performed at [facility], [address] [place], [place] [address]   Resp Panel by RT-PCR (Flu A&B, Covid) Nasopharyngeal Swab     Status: Abnormal   Collection Time: 03/24/21  4:36 PM   Specimen: Nasopharyngeal Swab; Nasopharyngeal(NP) swabs in vial transport medium  Result Value Ref Range Status   SARS Coronavirus 2 by RT PCR POSITIVE (A) NEGATIVE Final    Comment: RESULT CALLED TO, READ BACK BY AND VERIFIED WITH: CRUZ,K RN @2121  ON 03/24/21 JACKSON,K (NOTE) SARS-CoV-2 target nucleic acids are DETECTED.  The SARS-CoV-2 RNA is generally detectable in upper respiratory specimens during the acute phase of infection. Positive results are indicative of the presence of the identified virus, but do not rule out bacterial infection or co-infection with other pathogens not detected by the test. Clinical correlation with patient history and other diagnostic information is necessary to determine patient infection status. The expected result is Negative.  Fact Sheet for Patients: 03/26/21  Fact Sheet for Healthcare Providers: BloggerCourse.com  This test is not yet approved or cleared by the [employer] FDA and  has been authorized for detection and/or diagnosis of SARS-CoV-2 by FDA under an Emergency Use Authorization (EUA).  This EUA will remain in effect (meaning this test can  be used) for the duration of  the COVID-19 declaration under Section 564(b)(1) of the Act, 21 U.S.C. section 360bbb-3(b)(1), unless the authorization is terminated or revoked sooner.     Influenza A by PCR NEGATIVE NEGATIVE Final   Influenza B by PCR NEGATIVE NEGATIVE Final    Comment: (NOTE) The Xpert Xpress SARS-CoV-2/FLU/RSV plus assay is intended as an aid in the diagnosis of influenza from Nasopharyngeal swab specimens  and should not be used as a sole basis for treatment. Nasal washings and aspirates are unacceptable for Xpert Xpress SARS-CoV-2/FLU/RSV testing.  Fact Sheet for Patients: [place]  Fact Sheet for Healthcare Providers: BloggerCourse.com  This test is not yet approved or cleared by the [employer] FDA and has been authorized for detection and/or diagnosis of SARS-CoV-2 by FDA under an Emergency Use Authorization (EUA). This EUA will remain in effect (meaning this test can be used) for the duration of the COVID-19 declaration under Section 564(b)(1) of the Act, 21 U.S.C. section 360bbb-3(b)(1), unless  the authorization is terminated or revoked.  Performed at [facility], [address] [place], [place] [address]   MRSA Next Gen by PCR, Nasal     Status: None   Collection Time: 03/24/21  8:44 PM   Specimen: Nasal Mucosa; Nasal Swab  Result Value Ref Range Status   MRSA by PCR Next Gen NOT DETECTED NOT DETECTED Final    Comment: (NOTE) The GeneXpert MRSA Assay (FDA approved for NASAL specimens only), is one component of a comprehensive MRSA colonization surveillance program. It is not intended to diagnose MRSA infection nor to guide or monitor treatment for MRSA infections. Test performance is not FDA approved in patients less than 35 years old. Performed at [facility], [address], [place], [place] [address]       Radiology Studies: No results found.   Pamella Pert, MD, PhD [facility]  Between 7 am - 7 pm I am available, please contact me via Amion (for emergencies) or Securechat (non urgent messages)  Between 7 pm - 7 am I am not available, please contact night coverage MD/APP via Amion

## 2021-03-31 NOTE — Plan of Care (Signed)
Larry Adams had a quiet day, no complaints. He is eating well, denies SOB or DOE. VSS. This afternoon he reports he has a friend that he can stay with beginning tomorrow and is eager for discharge tomorrow.  Problem: Education: Goal: Knowledge of General Education information will improve Description: Including pain rating scale, medication(s)/side effects and non-pharmacologic comfort measures Outcome: Progressing   Problem: Health Behavior/Discharge Planning: Goal: Ability to manage health-related needs will improve Outcome: Progressing   Problem: Clinical Measurements: Goal: Diagnostic test results will improve Outcome: Progressing

## 2021-04-01 DIAGNOSIS — F10932 Alcohol use, unspecified with withdrawal with perceptual disturbance: Secondary | ICD-10-CM

## 2021-04-01 MED ORDER — CHLORDIAZEPOXIDE HCL 10 MG PO CAPS: 10.0000 mg | ORAL_CAPSULE | Freq: Three times a day (TID) | ORAL | 0 refills | Status: DC | PRN

## 2021-04-01 MED ORDER — ADULT MULTIVITAMIN W/MINERALS CH: 1.0000 | ORAL_TABLET | Freq: Every day | ORAL | 0 refills | Status: DC

## 2021-04-01 MED ORDER — SERTRALINE HCL 50 MG PO TABS: 50.0000 mg | ORAL_TABLET | Freq: Every day | ORAL | 0 refills | Status: DC

## 2021-04-01 MED ORDER — BUSPIRONE HCL 10 MG PO TABS: 10.0000 mg | ORAL_TABLET | Freq: Three times a day (TID) | ORAL | 0 refills | Status: DC

## 2021-04-01 NOTE — Discharge Summary (Signed)
Physician Discharge Summary  Larry Adams NWG:[id number] DOB: 1985/09/26 DOA: 03/24/2021  PCP: Patient, No Pcp Per (Inactive)  Admit date: 03/24/2021 Discharge date: 04/01/2021  Admitted From: homeless shelter Disposition:  living with a friend  Recommendations for Outpatient Follow-up:  Follow up with PCP in 1-2 weeks  Home Health: none Equipment/Devices: none  Discharge Condition: stable CODE STATUS: Full code Diet recommendation: regular  HPI: Per admitting MD, Larry Adams is a 35 y.o. male with medical history significant for polysubstance abuse (heroin, alcohol, benzo) and depression who reports that his crisis counselor dropped him off in the ED last night since he was having a feeling of hopelessness and was drinking and using IV heroin.  States he used about 5-6 beer.  He denies any suicidal ideation or hallucination.  He was initially cleared by psychiatry for discharge.  However, he later became febrile up to 102.9, tachycardic and was noted by ED physician to be tremulous with tactile hallucination so hospitalist was called for admission for alcohol withdrawal. On my evaluation, he was tachycardic up to 120s and hypertensive up to systolic of 173.  However he was sitting comfortably eating dinner.  Denies any hallucinations.  No tremors. CBC without leukocytosis or anemia.  Platelet of 141.  Sodium of 137, potassium of 3.1, BG of 102.  AST of 121, ALT 50, total bilirubin 1.9. Tylenol and salicylate level were negative. His initial alcohol level at presentation last evening of 213 and currently is less than 10. UDS positive for opioids Chest x-ray negative.  COVID and flu PCR negative. He reports having alcohol withdrawal seizures about a year ago.  Hospital Course / Discharge diagnoses: Principal Problem Alcohol withdrawal/polysubstance abuse /anxiety-patient was admitted to the hospital due to history of alcohol withdrawal seizures in the past, he is deemed high risk  for DTs.  He was monitored, improving, now appears to be back to baseline.  Anxiety has been the main issue while hospitalized, he was placed on BuSpar with improvement.  He is better, he will be given BuSpar on discharge along with his home Zoloft, also short course of Librium.  He states that he wants to quit alcohol   Active Problems Questionable acute COVID-19 viral infection Transient hypoxia while sleeping likely in the setting of sleep apnea, previously undiagnosed -he was briefly hypoxic while sleeping, and initially placed on Remdesivir, however he had 0 respiratory symptoms and Remdesivir has been stopped after 3 doses.  He has remained stable without any respiratory complaints, on room air.  He initially tested positive on 9/26, would not recommend more than 5-7-day isolation at this point.  Usual, however, because he was not vaccinated, will only take him back after 10 days which is on Thursday, 10/6.  He will stay with a friend until that time and will be discharged in stable condition. Elevated LFTs/total bili -Appear to be chronically elevated in the setting of heavy alcohol use/abuse.  Overall stable. Thrombocytopenia, mild, due to EtOH use   Sepsis ruled out   Discharge Instructions   Allergies as of 04/01/2021       Reactions   Morphine And Related Hives   Vancomycin Itching, Other (See Comments)   "Red man syndrome"         Medication List     STOP taking these medications    AIRBORNE GUMMIES PO   gabapentin 100 MG capsule Commonly known as: NEURONTIN   hydrOXYzine 25 MG tablet Commonly known as: ATARAX/VISTARIL   traZODone 50 MG tablet  Commonly known as: DESYREL       TAKE these medications    busPIRone 10 MG tablet Commonly known as: BUSPAR Take 1 tablet (10 mg total) by mouth 3 (three) times daily.   chlordiazePOXIDE 10 MG capsule Commonly known as: LIBRIUM Take 1 capsule (10 mg total) by mouth 3 (three) times daily as needed for anxiety. What  changed:  medication strength how much to take how to take this when to take this reasons to take this additional instructions   multivitamin with minerals Tabs tablet Take 1 tablet by mouth daily. Start taking on: April 02, 2021   sertraline 50 MG tablet Commonly known as: ZOLOFT Take 1 tablet (50 mg total) by mouth daily.        Follow-up Information     [facility]   Specialty: Urgent Care Contact information: [address] [place] [address] [phone]        [facility]   Contact information: [address] [place] [address] [phone]        Patient, No Pcp Per.   Specialty: General Practice        [facility]   Contact information: [address] [place] [address] [phone]                 Consultations: None   Procedures/Studies:  DG Chest Portable 1 View  Result Date: 03/24/2021 CLINICAL DATA:  Febrile. Fever today of 102.31F, HR of 128 and BP of 155/115. Recent use of heroin. EXAM: PORTABLE CHEST 1 VIEW COMPARISON:  Chest x-ray 01/11/2020 FINDINGS: The heart and mediastinal contours are unchanged. No focal consolidation. No pulmonary edema. No pleural effusion. No pneumothorax. No acute osseous abnormality. IMPRESSION: No active disease. Electronically Signed   By: Tish Frederickson M.D.   On: 03/24/2021 17:39     Subjective: - no chest pain, shortness of breath, no abdominal pain, nausea or vomiting.   Discharge Exam: BP 113/73 (BP Location: Left Arm)   Pulse 86   Temp 98.6 F (37 C) (Oral)   Resp 16   Ht 6\' 1"  (1.854 m)   Wt 127 kg   SpO2 98%   BMI 36.94 kg/m   General: Pt is alert, awake, not in acute distress Cardiovascular: RRR, S1/S2 +, no rubs, no gallops Respiratory: CTA bilaterally, no wheezing, no rhonchi Abdominal: Soft, NT, ND, bowel sounds + Extremities: no edema, no cyanosis    The  results of significant diagnostics from this hospitalization (including imaging, microbiology, ancillary and laboratory) are listed below for reference.     Microbiology: Recent Results (from the past 240 hour(s))  Resp Panel by RT-PCR (Flu A&B, Covid) Nasopharyngeal Swab     Status: None   Collection Time: 03/24/21  1:33 AM   Specimen: Nasopharyngeal Swab; Nasopharyngeal(NP) swabs in vial transport medium  Result Value Ref Range Status   SARS Coronavirus 2 by RT PCR NEGATIVE NEGATIVE Final    Comment: (NOTE) SARS-CoV-2 target nucleic acids are NOT DETECTED.  The SARS-CoV-2 RNA is generally detectable in upper respiratory specimens during the acute phase of infection. The lowest concentration of SARS-CoV-2 viral copies this assay can detect is 138 copies/mL. A negative result does not preclude SARS-Cov-2 infection and should not be used as the sole basis for treatment or other patient management decisions. A negative result may occur with  improper specimen collection/handling, submission of specimen other than nasopharyngeal swab, presence of viral  mutation(s) within the areas targeted by this assay, and inadequate number of viral copies(<138 copies/mL). A negative result must be combined with clinical observations, patient history, and epidemiological information. The expected result is Negative.  Fact Sheet for Patients:  BloggerCourse.com  Fact Sheet for Healthcare Providers:  SeriousBroker.it  This test is no t yet approved or cleared by the [place] FDA and  has been authorized for detection and/or diagnosis of SARS-CoV-2 by FDA under an Emergency Use Authorization (EUA). This EUA will remain  in effect (meaning this test can be used) for the duration of the COVID-19 declaration under Section 564(b)(1) of the Act, 21 U.S.C.section 360bbb-3(b)(1), unless the authorization is terminated  or revoked sooner.        Influenza A by PCR NEGATIVE NEGATIVE Final   Influenza B by PCR NEGATIVE NEGATIVE Final    Comment: (NOTE) The Xpert Xpress SARS-CoV-2/FLU/RSV plus assay is intended as an aid in the diagnosis of influenza from Nasopharyngeal swab specimens and should not be used as a sole basis for treatment. Nasal washings and aspirates are unacceptable for Xpert Xpress SARS-CoV-2/FLU/RSV testing.  Fact Sheet for Patients: BloggerCourse.com  Fact Sheet for Healthcare Providers: SeriousBroker.it  This test is not yet approved or cleared by the [place] FDA and has been authorized for detection and/or diagnosis of SARS-CoV-2 by FDA under an Emergency Use Authorization (EUA). This EUA will remain in effect (meaning this test can be used) for the duration of the COVID-19 declaration under Section 564(b)(1) of the Act, 21 U.S.C. section 360bbb-3(b)(1), unless the authorization is terminated or revoked.  Performed at [facility], [address], [place], [place] [address]   Resp Panel by RT-PCR (Flu A&B, Covid) Nasopharyngeal Swab     Status: Abnormal   Collection Time: 03/24/21  4:36 PM   Specimen: Nasopharyngeal Swab; Nasopharyngeal(NP) swabs in vial transport medium  Result Value Ref Range Status   SARS Coronavirus 2 by RT PCR POSITIVE (A) NEGATIVE Final    Comment: RESULT CALLED TO, READ BACK BY AND VERIFIED WITH: CRUZ,K RN @2121  ON 03/24/21 JACKSON,K (NOTE) SARS-CoV-2 target nucleic acids are DETECTED.  The SARS-CoV-2 RNA is generally detectable in upper respiratory specimens during the acute phase of infection. Positive results are indicative of the presence of the identified virus, but do not rule out bacterial infection or co-infection with other pathogens not detected by the test. Clinical correlation with patient history and other diagnostic information is necessary to determine patient infection status. The  expected result is Negative.  Fact Sheet for Patients: 03/26/21  Fact Sheet for Healthcare Providers: BloggerCourse.com  This test is not yet approved or cleared by the [place]ious[place] FDA and  has been authorized for detection and/or diagnosis of SARS-CoV-2 by FDA under an Emergency Use Authorization (EUA).  This EUA will remain in effect (meaning this test can  be used) for the duration of  the COVID-19 declaration under Section 564(b)(1) of the Act, 21 U.S.C. section 360bbb-3(b)(1), unless the authorization is terminated or revoked sooner.     Influenza A by PCR NEGATIVE NEGATIVE Final   Influenza B by PCR NEGATIVE NEGATIVE Final    Comment: (NOTE) The Xpert Xpress SARS-CoV-2/FLU/RSV plus assay is intended as an aid in the diagnosis of influenza from Nasopharyngeal swab specimens and should not be used as a sole basis for treatment. Nasal washings and aspirates are unacceptable for Xpert Xpress SARS-CoV-2/FLU/RSV testing.  Fact Sheet for Patients: [place]  Fact Sheet for Healthcare Providers: BloggerCourse.com  This test is not yet approved or cleared by the [place] and has been authorized for detection and/or diagnosis of SARS-CoV-2 by FDA under an Emergency Use Authorization (EUA). This EUA will remain in effect (meaning this test can be used) for the duration of the COVID-19 declaration under Section 564(b)(1) of the Act, 21 U.S.C. section 360bbb-3(b)(1), unless the authorization is terminated or revoked.  Performed at [facility], [address], [place], [place] [address]   MRSA Next Gen by PCR, Nasal     Status: None   Collection Time: 03/24/21  8:44 PM   Specimen: Nasal Mucosa; Nasal Swab  Result Value Ref Range Status   MRSA by PCR Next Gen NOT DETECTED NOT DETECTED Final    Comment: (NOTE) The GeneXpert MRSA Assay  (FDA approved for NASAL specimens only), is one component of a comprehensive MRSA colonization surveillance program. It is not intended to diagnose MRSA infection nor to guide or monitor treatment for MRSA infections. Test performance is not FDA approved in patients less than 17 years old. Performed at [facility], [address], [place], [place] [address]      Labs: Basic Metabolic Panel: Recent Labs  Lab 03/26/21 0233 03/27/21 0355 03/28/21 0413 03/29/21 0706 03/30/21 0419  NA 134* 139 137 140 140  K 3.6 3.7 3.8 3.6 3.3*  CL 103 109 106 111 109  CO2 26 26 25 25 28   GLUCOSE 95 99 91 97 95  BUN 8 9 10 10 10   CREATININE 0.72 0.70 0.80 0.73 0.78  CALCIUM 8.4* 8.8* 8.9 8.8* 8.9   Liver Function Tests: Recent Labs  Lab 03/26/21 0233 03/27/21 0355 03/28/21 0413 03/29/21 0706 03/30/21 0419  AST 77* 62* 75* 71* 113*  ALT 34 29 33 30 46*  ALKPHOS 93 93 103 89 91  BILITOT 1.3* 1.2 1.6* 1.6* 1.4*  PROT 6.0* 5.9* 6.5 6.3* 6.8  ALBUMIN 2.3* 2.3* 2.5* 2.5* 2.6*   CBC: Recent Labs  Lab 03/26/21 0233 03/27/21 0355 03/28/21 0413 03/29/21 0706 03/30/21 0419  WBC 6.9 7.9 8.9 6.9 8.4  HGB 13.8 14.0 15.9 14.8 14.9  HCT 40.8 40.0 46.5 43.3 44.7  MCV 95.6 93.7 94.9 95.4 97.2  PLT 90* 110* 121* 136* 187   CBG: No results for input(s): GLUCAP in the last 168 hours. Hgb A1c No results for input(s): HGBA1C in the last 72 hours. Lipid Profile No results for input(s): CHOL, HDL, LDLCALC, TRIG, CHOLHDL, LDLDIRECT in the last 72 hours. Thyroid function studies No results for input(s): TSH, T4TOTAL, T3FREE, THYROIDAB in the last 72 hours.  Invalid input(s): FREET3 Urinalysis    Component Value Date/Time   COLORURINE YELLOW 03/24/2021 0133   APPEARANCEUR CLEAR 03/24/2021 0133   LABSPEC <1.005 (L) 03/24/2021 0133   PHURINE 7.0 03/24/2021 0133   GLUCOSEU NEGATIVE 03/24/2021 0133   HGBUR NEGATIVE 03/24/2021 0133   BILIRUBINUR NEGATIVE 03/24/2021 0133    KETONESUR NEGATIVE 03/24/2021 0133   PROTEINUR NEGATIVE 03/24/2021 0133   UROBILINOGEN 0.2 12/18/2012 0113   NITRITE NEGATIVE 03/24/2021 0133   LEUKOCYTESUR NEGATIVE 03/24/2021 0133    FURTHER DISCHARGE INSTRUCTIONS:   Get Medicines reviewed and adjusted: Please take all your medications with you for your next visit with your Primary MD   Laboratory/radiological data: Please request your Primary MD to go over all hospital tests and procedure/radiological results at the follow up, please ask your Primary MD to get all Hospital records sent to his/her office.   In some  cases, they will be blood work, cultures and biopsy results pending at the time of your discharge. Please request that your primary care M.D. goes through all the records of your hospital data and follows up on these results.   Also Note the following: If you experience worsening of your admission symptoms, develop shortness of breath, life threatening emergency, suicidal or homicidal thoughts you must seek medical attention immediately by calling 911 or calling your MD immediately  if symptoms less severe.   You must read complete instructions/literature along with all the possible adverse reactions/side effects for all the Medicines you take and that have been prescribed to you. Take any new Medicines after you have completely understood and accpet all the possible adverse reactions/side effects.    Do not drive when taking Pain medications or sleeping medications (Benzodaizepines)   Do not take more than prescribed Pain, Sleep and Anxiety Medications. It is not advisable to combine anxiety,sleep and pain medications without talking with your primary care practitioner   Special Instructions: If you have smoked or chewed Tobacco  in the last 2 yrs please stop smoking, stop any regular Alcohol  and or any Recreational drug use.   Wear Seat belts while driving.   Please note: You were cared for by a hospitalist during your  hospital stay. Once you are discharged, your primary care physician will handle any further medical issues. Please note that NO REFILLS for any discharge medications will be authorized once you are discharged, as it is imperative that you return to your primary care physician (or establish a relationship with a primary care physician if you do not have one) for your post hospital discharge needs so that they can reassess your need for medications and monitor your lab values.  Time coordinating discharge: 40 minutes  SIGNED:  Pamella Pert, MD, PhD 04/01/2021, 9:09 AM

## 2021-04-01 NOTE — Plan of Care (Signed)
  Problem: Health Behavior/Discharge Planning: Goal: Ability to manage health-related needs will improve Outcome: Adequate for Discharge   Problem: Clinical Measurements: Goal: Will remain free from infection Outcome: Adequate for Discharge   

## 2021-04-01 NOTE — Progress Notes (Signed)
Discharge instructions reviewed w/ pt. Pt verbalizes understanding and all questions answeered. Pt in possession of d/c packet and scripts and all personal belongings. Transportation to pt's friend's home at [employer] in [place] arranged through [facility] transportation services. Pt in stable condition and eager for discharge.

## 2021-04-07 ENCOUNTER — Other Ambulatory Visit (HOSPITAL_COMMUNITY): Payer: Self-pay

## 2021-04-07 ENCOUNTER — Other Ambulatory Visit: Payer: Self-pay

## 2021-04-07 ENCOUNTER — Emergency Department (HOSPITAL_COMMUNITY)
Admission: EM | Admit: 2021-04-07 | Discharge: 2021-04-07 | Disposition: A | Discharge status: Home / Self Care | Payer: Medicaid Other | Attending: Emergency Medicine | Admitting: Emergency Medicine

## 2021-04-07 ENCOUNTER — Emergency Department (HOSPITAL_COMMUNITY): Payer: Medicaid Other

## 2021-04-07 DIAGNOSIS — Z79899 Other long term (current) drug therapy: Secondary | ICD-10-CM | POA: Insufficient documentation

## 2021-04-07 DIAGNOSIS — F1010 Alcohol abuse, uncomplicated: Secondary | ICD-10-CM | POA: Insufficient documentation

## 2021-04-07 DIAGNOSIS — R000 Tachycardia, unspecified: Secondary | ICD-10-CM | POA: Insufficient documentation

## 2021-04-07 DIAGNOSIS — Y907 Blood alcohol level of 200-239 mg/100 ml: Secondary | ICD-10-CM | POA: Insufficient documentation

## 2021-04-07 DIAGNOSIS — F419 Anxiety disorder, unspecified: Secondary | ICD-10-CM | POA: Insufficient documentation

## 2021-04-07 DIAGNOSIS — R0602 Shortness of breath: Secondary | ICD-10-CM | POA: Insufficient documentation

## 2021-04-07 DIAGNOSIS — F17210 Nicotine dependence, cigarettes, uncomplicated: Secondary | ICD-10-CM | POA: Insufficient documentation

## 2021-04-07 LAB — CBC WITH DIFFERENTIAL/PLATELET
Abs Immature Granulocytes: 0.01 10*3/uL (ref 0.00–0.07)
Basophils Absolute: 0.1 10*3/uL (ref 0.0–0.1)
Basophils Relative: 2 %
Eosinophils Absolute: 0.3 10*3/uL (ref 0.0–0.5)
Eosinophils Relative: 4 %
HCT: 41.5 % (ref 39.0–52.0)
Hemoglobin: 14.2 g/dL (ref 13.0–17.0)
Immature Granulocytes: 0 %
Lymphocytes Relative: 55 %
Lymphs Abs: 4.3 10*3/uL — ABNORMAL HIGH (ref 0.7–4.0)
MCH: 32.9 pg (ref 26.0–34.0)
MCHC: 34.2 g/dL (ref 30.0–36.0)
MCV: 96.3 fL (ref 80.0–100.0)
Monocytes Absolute: 0.8 10*3/uL (ref 0.1–1.0)
Monocytes Relative: 11 %
Neutro Abs: 2.2 10*3/uL (ref 1.7–7.7)
Neutrophils Relative %: 28 %
Platelets: 296 10*3/uL (ref 150–400)
RBC: 4.31 MIL/uL (ref 4.22–5.81)
RDW: 15 % (ref 11.5–15.5)
WBC: 7.7 10*3/uL (ref 4.0–10.5)
nRBC: 0 % (ref 0.0–0.2)

## 2021-04-07 LAB — ETHANOL: Alcohol, Ethyl (B): 209 mg/dL — ABNORMAL HIGH (ref <10)

## 2021-04-07 LAB — RAPID URINE DRUG SCREEN, HOSP PERFORMED
Amphetamines: NOT DETECTED
Barbiturates: NOT DETECTED
Benzodiazepines: NOT DETECTED
Cocaine: NOT DETECTED
Opiates: NOT DETECTED
Tetrahydrocannabinol: NOT DETECTED

## 2021-04-07 LAB — COMPREHENSIVE METABOLIC PANEL
ALT: 79 U/L — ABNORMAL HIGH (ref 0–44)
AST: 108 U/L — ABNORMAL HIGH (ref 15–41)
Albumin: 2.9 g/dL — ABNORMAL LOW (ref 3.5–5.0)
Alkaline Phosphatase: 79 U/L (ref 38–126)
Anion gap: 10 (ref 5–15)
BUN: 5 mg/dL — ABNORMAL LOW (ref 6–20)
CO2: 25 mmol/L (ref 22–32)
Calcium: 8.7 mg/dL — ABNORMAL LOW (ref 8.9–10.3)
Chloride: 104 mmol/L (ref 98–111)
Creatinine, Ser: 0.87 mg/dL (ref 0.61–1.24)
GFR, Estimated: >60 mL/min (ref >60)
Glucose, Bld: 89 mg/dL (ref 70–99)
Potassium: 3.6 mmol/L (ref 3.5–5.1)
Sodium: 139 mmol/L (ref 135–145)
Total Bilirubin: 1.4 mg/dL — ABNORMAL HIGH (ref 0.3–1.2)
Total Protein: 7.2 g/dL (ref 6.5–8.1)

## 2021-04-07 LAB — TROPONIN I (HIGH SENSITIVITY): Troponin I (High Sensitivity): 11 ng/L (ref <18)

## 2021-04-07 LAB — BRAIN NATRIURETIC PEPTIDE: B Natriuretic Peptide: 73.8 pg/mL (ref 0.0–100.0)

## 2021-04-07 MED ORDER — LORAZEPAM 1 MG PO TABS: 0.0000 mg | ORAL_TABLET | Freq: Four times a day (QID) | ORAL | Status: DC

## 2021-04-07 MED ORDER — CHLORDIAZEPOXIDE HCL 25 MG PO CAPS
ORAL_CAPSULE | ORAL | 0 refills | Status: DC
  Filled 2021-04-07: qty 12, 3d supply, fill #0

## 2021-04-07 MED ORDER — LORAZEPAM 1 MG PO TABS: 1.0000 mg | ORAL_TABLET | Freq: Once | ORAL | Status: DC

## 2021-04-07 MED ORDER — LORAZEPAM 2 MG/ML IJ SOLN: 0.0000 mg | Freq: Four times a day (QID) | INTRAMUSCULAR | Status: DC

## 2021-04-07 MED ORDER — THIAMINE HCL 100 MG/ML IJ SOLN: 100.0000 mg | Freq: Every day | INTRAMUSCULAR | Status: DC

## 2021-04-07 MED ORDER — THIAMINE HCL 100 MG PO TABS
100.0000 mg | ORAL_TABLET | Freq: Every day | ORAL | Status: DC
  Administered 2021-04-07: 100 mg via ORAL
  Filled 2021-04-07: qty 1

## 2021-04-07 MED ORDER — LORAZEPAM 1 MG PO TABS: 0.0000 mg | ORAL_TABLET | Freq: Two times a day (BID) | ORAL | Status: DC

## 2021-04-07 MED ORDER — BUSPIRONE HCL 10 MG PO TABS
10.0000 mg | ORAL_TABLET | Freq: Three times a day (TID) | ORAL | 0 refills | Status: AC
  Filled 2021-04-07: qty 90, 30d supply, fill #0

## 2021-04-07 MED ORDER — LORAZEPAM 2 MG/ML IJ SOLN: 0.0000 mg | Freq: Two times a day (BID) | INTRAMUSCULAR | Status: DC

## 2021-04-07 NOTE — ED Triage Notes (Signed)
Pt brought in by EMS for shortness of breath upon exertion. Pt also states that he wants detox from alcohol and heroin.

## 2021-04-07 NOTE — ED Provider Notes (Signed)
[facility] EMERGENCY DEPARTMENT Provider Note   CSN: [id number] Arrival date & time: 04/07/21  0128     History Chief Complaint  Patient presents with   Shortness of Breath    Larry Adams is a 35 y.o. male with history of polysubstance abuse who presents via EMS for concern for shortness of breath and severe anxiety.  States that shortness of breath is primarily upon exertion that started yesterday morning, however he became more anxious in the evening and therefore called EMS.  Additionally patient states that he is looking for assistance detoxing from alcohol as he has secured entrance to a sober living home in [place].  States that he must be sober for at least a few days prior to being able to return to his sober living facility.  Patient denies any chest pain, palpitations, or syncope.  Is requesting benzodiazepines for anxiety.  I personally reviewed this patient's medical records.  She has history of polysubstance abuse and alcohol with seizure in the past.  Patient was recently admitted to this facility for alcohol withdrawal and at that time also was found to be COVID-19 positive.  Was prescribed BuSpar and Librium at discharge but states that he "left on my paperwork in the [facility] that took me home" and was unable to pick up his prescriptions.  HPI     Past Medical History:  Diagnosis Date   Anxiety    Back pain    Depression    Drug overdose    Mental disorder     Patient Active Problem List   Diagnosis Date Noted   Alcohol withdrawal (HCC) 03/24/2021   Elevated LFTs 03/24/2021   Acute respiratory failure with hypoxia (HCC) 01/11/2020   Polysubstance abuse (HCC) 01/11/2020   Aspiration pneumonia (HCC)    Severe recurrent major depression without psychotic features (HCC) 11/06/2018   Alcohol abuse with alcohol-induced mood disorder (HCC) 09/12/2017   MDD (major depressive disorder), recurrent severe, without psychosis (HCC) 09/12/2017    Benzodiazepine dependence (HCC) 07/23/2014   Moderate alcohol use disorder (HCC) 07/23/2014   Substance induced mood disorder (HCC) 07/23/2014   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    Past Surgical History:  Procedure Laterality Date   HERNIA REPAIR         Family History  Family history unknown: Yes    Social History   Tobacco Use   Smoking status: Every Day    Packs/day: 1.00    Types: Cigarettes   Smokeless tobacco: Former  Building services engineer Use: Former  Substance Use Topics   Alcohol use: Yes    Alcohol/week: 6.0 standard drinks    Types: 6 Cans of beer per week    Comment: PTA 25 oz of beer; drinking all day everyday for 2 months   Drug use: Yes    Types: IV, Fentanyl    Comment: Overdose on Heroin; IV Fentanyl use    Home Medications Prior to Admission medications   Medication Sig Start Date End Date Taking? Authorizing Provider  busPIRone (BUSPAR) 10 MG tablet Take 1 tablet (10 mg total) by mouth 3 (three) times daily. 04/07/21 05/07/21 Yes Mckinnon Glick R, PA-C  chlordiazePOXIDE (LIBRIUM) 25 MG capsule Take 2 capsules (50mg ) by mouth three times daily for 1 day, then take 1-2 caps (25-50mg ) twice a day for 1 day, then 1-2 caps (25-50mg ) daily for 1 day. 04/07/21  Yes Precious Segall R, PA-C  Multiple Vitamin (MULTIVITAMIN WITH MINERALS) TABS tablet Take 1  tablet by mouth daily. 04/02/21   Leatha Gilding, MD  sertraline (ZOLOFT) 50 MG tablet Take 1 tablet (50 mg total) by mouth daily. 04/01/21   Leatha Gilding, MD    Allergies    Morphine and related and Vancomycin  Review of Systems   Review of Systems  Constitutional: Negative.   HENT: Negative.    Respiratory:  Positive for chest tightness and shortness of breath. Negative for cough and choking.   Cardiovascular: Negative.   Gastrointestinal: Negative.   Genitourinary: Negative.   Musculoskeletal: Negative.   Skin: Negative.   Neurological: Negative.   Hematological:  Negative.   Psychiatric/Behavioral:  Negative for self-injury and suicidal ideas. The patient is nervous/anxious.    Physical Exam Updated Vital Signs BP (!) 130/93   Pulse 100   Temp 98.6 F (37 C) (Oral)   Resp 17   Ht 6\' 1"  (1.854 m)   Wt 127 kg   SpO2 98%   BMI 36.94 kg/m   Physical Exam Vitals and nursing note reviewed.  Constitutional:      General: He is not in acute distress.    Appearance: He is obese. He is not ill-appearing or toxic-appearing.  HENT:     Head: Normocephalic and atraumatic.     Nose: Nose normal.     Mouth/Throat:     Mouth: Mucous membranes are moist.     Pharynx: Oropharynx is clear. Uvula midline. No oropharyngeal exudate or posterior oropharyngeal erythema.     Tonsils: No tonsillar exudate.  Eyes:     General: Lids are normal. Vision grossly intact.        Right eye: No discharge.        Left eye: No discharge.     Extraocular Movements: Extraocular movements intact.     Conjunctiva/sclera: Conjunctivae normal.     Pupils: Pupils are equal, round, and reactive to light.  Neck:     Trachea: Trachea and phonation normal.  Cardiovascular:     Rate and Rhythm: Normal rate and regular rhythm.     Pulses: Normal pulses.     Heart sounds: Normal heart sounds. No murmur heard. Pulmonary:     Effort: Pulmonary effort is normal. No tachypnea, bradypnea, accessory muscle usage, prolonged expiration or respiratory distress.     Breath sounds: Normal breath sounds. No wheezing or rales.  Chest:     Chest wall: No mass, swelling, tenderness, crepitus or edema.  Abdominal:     General: Bowel sounds are normal. There is no distension.     Palpations: Abdomen is soft.     Tenderness: There is no abdominal tenderness.  Musculoskeletal:        General: No deformity.     Cervical back: Normal range of motion and neck supple. No edema, rigidity or crepitus. No pain with movement, spinous process tenderness or muscular tenderness.     Right lower leg: No  edema.     Left lower leg: No edema.  Lymphadenopathy:     Cervical: No cervical adenopathy.  Skin:    General: Skin is warm and dry.     Capillary Refill: Capillary refill takes less than 2 seconds.  Neurological:     General: No focal deficit present.     Mental Status: He is alert. Mental status is at baseline.     GCS: GCS eye subscore is 4. GCS verbal subscore is 5. GCS motor subscore is 6.     Motor: Motor function is intact.  Gait: Gait is intact.  Psychiatric:        Attention and Perception: Attention normal. He does not perceive auditory or visual hallucinations.        Mood and Affect: Mood is anxious.        Speech: Speech normal.        Behavior: Behavior is cooperative.        Thought Content: Thought content does not include homicidal or suicidal ideation.     Comments: Does not appear to be responding to internal stimuli.     ED Results / Procedures / Treatments   Labs (all labs ordered are listed, but only abnormal results are displayed) Labs Reviewed  COMPREHENSIVE METABOLIC PANEL - Abnormal; Notable for the following components:      Result Value   BUN 5 (*)    Calcium 8.7 (*)    Albumin 2.9 (*)    AST 108 (*)    ALT 79 (*)    Total Bilirubin 1.4 (*)    All other components within normal limits  CBC WITH DIFFERENTIAL/PLATELET - Abnormal; Notable for the following components:   Lymphs Abs 4.3 (*)    All other components within normal limits  ETHANOL - Abnormal; Notable for the following components:   Alcohol, Ethyl (B) 209 (*)    All other components within normal limits  BRAIN NATRIURETIC PEPTIDE  RAPID URINE DRUG SCREEN, HOSP PERFORMED  TROPONIN I (HIGH SENSITIVITY)    EKG EKG Interpretation  Date/Time:  Monday April 07 2021 01:31:33 EDT Ventricular Rate:  103 PR Interval:  116 QRS Duration: 82 QT Interval:  358 QTC Calculation: 468 R Axis:   50 Text Interpretation: Sinus tachycardia Otherwise normal ECG No significant change since last  tracing Confirmed by Gwyneth Sprout ([id number]) on 04/07/2021 8:24:32 AM  Radiology DG Chest 2 View  Result Date: 04/07/2021 CLINICAL DATA:  Dyspnea for a few days.  Smoker.  Alcohol abuse. EXAM: CHEST - 2 VIEW COMPARISON:  03/24/2021 FINDINGS: Heart size and pulmonary vascularity are normal. Lungs are clear. No pleural effusions. No pneumothorax. Mediastinal contours appear intact. IMPRESSION: No active cardiopulmonary disease. Electronically Signed   By: Burman Nieves M.D.   On: 04/07/2021 02:02    Procedures Procedures   Medications Ordered in ED Medications - No data to display   ED Course  I have reviewed the triage vital signs and the nursing notes.  Pertinent labs & imaging results that were available during my care of the patient were reviewed by me and considered in my medical decision making (see chart for details).  Clinical Course as of 04/07/21 1817  Mon Apr 07, 2021  1124 Presenting to the bedside to reevaluate the patient; it appears he has eloped without receiving his anxiolytic or outpatient prescriptions. He may return at any time.  [RS]    Clinical Course User Index [RS] Jibreel Fedewa, Eugene Gavia, PA-C   MDM Rules/Calculators/A&P                         35 year old male with history of polysubstance abuse presents with concern for anxiety, dyspnea on exertion and request for detox from alcohol.  Patient was hypertensive and tachypneic on intake, however at time of my exam cardiopulmonary exam is normal and vital signs are normal.  Abdominal exam is benign.  Patient is neurovascular intact in all 4 extremities and while he does have track marks from known heroin injection sites, does not appear to be any  acute cellulitis.  No murmur on cardiac exam, and patient remains afebrile.  Patient does appear extremely anxious, fidgety but without acute focal deficit on neurologic exam.  Denies SI, HI, or AVH at this time.  Labs were obtained from triage.  CBC is unremarkable,  CMP with transaminitis with AST/ALT 108/79 and bilirubin at patient's baseline of 1.4.  Alcohol level elevated to 209. Troponin negative, 11 and BNP is negative.  Rapid drug screen negative as well.  EKG nonischemic and chest x-ray without acute cardiopulmonary disease.  Patient endorses drinking proximately 15, 16 ounce beers per day.  Most recently drank last night.  States he drinks "all day every day".  Patient without acute alcohol withdrawal on physical exam though he does appear quite anxious.  Single dose of oral anxiolysis ordered.  BuSpar and Librium ordered through [facility] pharmacy as patient may be dispensed these medications prior to his departure from the emergency department.  Reis voiced understanding of his medical evaluation and treatment plan and is amenable to waiting in the emergency department for dispensation of his prescriptions.  Unfortunately I return to the bedside to reevaluate the patient and he has apparently eloped from the emergency department without receiving his oral anxiety medication or his outpatient prescriptions.  He may return to the ER at any time.  This chart was dictated using voice recognition software, Dragon. Despite the best efforts of this provider to proofread and correct errors, errors may still occur which can change documentation meaning.   Final Clinical Impression(s) / ED Diagnoses Final diagnoses:  Anxiety  Alcohol abuse    Rx / DC Orders ED Discharge Orders          Ordered    chlordiazePOXIDE (LIBRIUM) 25 MG capsule        04/07/21 1024    busPIRone (BUSPAR) 10 MG tablet  3 times daily        04/07/21 [address] Eugene Gavia, PA-C 04/07/21 1817    Gwyneth Sprout, MD 04/10/21 1657

## 2021-04-07 NOTE — ED Notes (Addendum)
Patient states he drinks 10-20 high gravity beers per day. Last drink was last night.   Patient also states that he uses a small amount ($10 worth per pt) of heroin 3x/week. Last use was last night as well. Pt now reporting he only wants alcohol detox and not for heroin.

## 2021-04-07 NOTE — ED Notes (Signed)
Went to obtain VS, give PO ativan and discharge pt and pt was no longer in the room. Provider notified.

## 2021-04-07 NOTE — Discharge Instructions (Addendum)
You were seen in the ER today for your anxiety and shortness of breath.  Your physical exam, vital signs, blood work, and EKG were very reassuring.   You have been provided with prescriptions for your anxiety medications as well as Librium which is a medication to help you transition off of alcohol.  Please take these as prescribed for the entire course.  You may follow-up with your outpatient primary care doctor.  Return to the ER with any new severe symptoms.

## 2021-04-07 NOTE — ED Provider Notes (Signed)
Emergency Medicine Provider Triage Evaluation Note  Larry Adams , a 35 y.o. male  was evaluated in triage.  Pt complains of exertional dyspnea tonight. No other alleviating/aggravating factors. Chest feels "abnormal" not necessarily pain or tight, having difficulty describing. Also reports bilateral LE swelling. Reports recent 8 day admission to the hospital for COVID 19. Also reports heroin use (IV) and daily EtOH use, drinks "a lot" last drink 2 hours PTA- wants to detox.   Review of Systems  Positive: Dyspnea, chest discomfort, congestion, leg swelling Negative: Hemoptysis, syncope, vomiting  Physical Exam  BP (!) 126/99   Pulse (!) 102   Temp 98.6 F (37 C) (Oral)   Resp 16   Ht 6\' 1"  (1.854 m)   Wt 127 kg   SpO2 99%   BMI 36.94 kg/m  Gen:   Awake, no distress   Resp:  Normal effort  MSK:   Moves extremities without difficulty, mild pitting edema to bilateral lower extremities.   Medical Decision Making  Medically screening exam initiated at 1:44 AM.  Appropriate orders placed.  Larry Adams was informed that the remainder of the evaluation will be completed by another provider, this initial triage assessment does not replace that evaluation, and the importance of remaining in the ED until their evaluation is complete.  Dyspnea.    Larry Pontes, PA-C 04/07/21 06/07/21    [id number], MD 04/07/21 [phone]

## 2021-04-07 NOTE — ED Notes (Addendum)
Patient states he has a previous hx of seizures when withdrawing. Patient states that he is currently withdrawing and is asking for something for anxiety and wanting to talk to provider. Patient in NAD. Notified PA.

## 2021-08-31 ENCOUNTER — Encounter (HOSPITAL_BASED_OUTPATIENT_CLINIC_OR_DEPARTMENT_OTHER): Payer: Self-pay | Admitting: Emergency Medicine

## 2021-08-31 ENCOUNTER — Emergency Department (HOSPITAL_BASED_OUTPATIENT_CLINIC_OR_DEPARTMENT_OTHER): Payer: Managed Care, Other (non HMO)

## 2021-08-31 ENCOUNTER — Emergency Department (HOSPITAL_BASED_OUTPATIENT_CLINIC_OR_DEPARTMENT_OTHER)
Admission: EM | Admit: 2021-08-31 | Discharge: 2021-08-31 | Disposition: A | Discharge status: Home / Self Care | Payer: Managed Care, Other (non HMO) | Attending: Emergency Medicine | Admitting: Emergency Medicine

## 2021-08-31 ENCOUNTER — Other Ambulatory Visit: Payer: Self-pay

## 2021-08-31 DIAGNOSIS — R600 Localized edema: Secondary | ICD-10-CM | POA: Diagnosis not present

## 2021-08-31 DIAGNOSIS — M7989 Other specified soft tissue disorders: Secondary | ICD-10-CM | POA: Diagnosis present

## 2021-08-31 LAB — CBC WITH DIFFERENTIAL/PLATELET
Abs Immature Granulocytes: 0.01 10*3/uL (ref 0.00–0.07)
Basophils Absolute: 0.1 10*3/uL (ref 0.0–0.1)
Basophils Relative: 1 %
Eosinophils Absolute: 0.5 10*3/uL (ref 0.0–0.5)
Eosinophils Relative: 8 %
HCT: 39 % (ref 39.0–52.0)
Hemoglobin: 13.2 g/dL (ref 13.0–17.0)
Immature Granulocytes: 0 %
Lymphocytes Relative: 49 %
Lymphs Abs: 2.9 10*3/uL (ref 0.7–4.0)
MCH: 32.6 pg (ref 26.0–34.0)
MCHC: 33.8 g/dL (ref 30.0–36.0)
MCV: 96.3 fL (ref 80.0–100.0)
Monocytes Absolute: 0.7 10*3/uL (ref 0.1–1.0)
Monocytes Relative: 12 %
Neutro Abs: 1.8 10*3/uL (ref 1.7–7.7)
Neutrophils Relative %: 30 %
Platelets: 216 10*3/uL (ref 150–400)
RBC: 4.05 MIL/uL — ABNORMAL LOW (ref 4.22–5.81)
RDW: 13.5 % (ref 11.5–15.5)
WBC: 5.9 10*3/uL (ref 4.0–10.5)
nRBC: 0 % (ref 0.0–0.2)

## 2021-08-31 LAB — COMPREHENSIVE METABOLIC PANEL
ALT: 95 U/L — ABNORMAL HIGH (ref 0–44)
AST: 114 U/L — ABNORMAL HIGH (ref 15–41)
Albumin: 2.9 g/dL — ABNORMAL LOW (ref 3.5–5.0)
Alkaline Phosphatase: 109 U/L (ref 38–126)
Anion gap: 7 (ref 5–15)
BUN: 7 mg/dL (ref 6–20)
CO2: 27 mmol/L (ref 22–32)
Calcium: 9.4 mg/dL (ref 8.9–10.3)
Chloride: 107 mmol/L (ref 98–111)
Creatinine, Ser: 0.9 mg/dL (ref 0.61–1.24)
GFR, Estimated: >60 mL/min (ref >60)
Glucose, Bld: 85 mg/dL (ref 70–99)
Potassium: 4 mmol/L (ref 3.5–5.1)
Sodium: 141 mmol/L (ref 135–145)
Total Bilirubin: 1 mg/dL (ref 0.3–1.2)
Total Protein: 7.1 g/dL (ref 6.5–8.1)

## 2021-08-31 LAB — BRAIN NATRIURETIC PEPTIDE: B Natriuretic Peptide: 11.8 pg/mL (ref 0.0–100.0)

## 2021-08-31 LAB — URINALYSIS, ROUTINE W REFLEX MICROSCOPIC
Bilirubin Urine: NEGATIVE
Glucose, UA: NEGATIVE mg/dL
Ketones, ur: NEGATIVE mg/dL
Leukocytes,Ua: NEGATIVE
Nitrite: NEGATIVE
Protein, ur: NEGATIVE mg/dL
Specific Gravity, Urine: 1.015 (ref 1.005–1.030)
pH: 8.5 — ABNORMAL HIGH (ref 5.0–8.0)

## 2021-08-31 LAB — LIPASE, BLOOD: Lipase: 42 U/L (ref 11–51)

## 2021-08-31 LAB — URINALYSIS, MICROSCOPIC (REFLEX)

## 2021-08-31 LAB — ETHANOL: Alcohol, Ethyl (B): 70 mg/dL — ABNORMAL HIGH (ref <10)

## 2021-08-31 MED ORDER — FUROSEMIDE 20 MG PO TABS: 20.0000 mg | ORAL_TABLET | Freq: Every day | ORAL | 0 refills | Status: AC

## 2021-08-31 MED ORDER — FUROSEMIDE 40 MG PO TABS
40.0000 mg | ORAL_TABLET | Freq: Once | ORAL | Status: AC
  Administered 2021-08-31: 40 mg via ORAL
  Filled 2021-08-31: qty 1

## 2021-08-31 NOTE — ED Notes (Signed)
Patient provided with urinal to provide a urine sample ?

## 2021-08-31 NOTE — ED Notes (Signed)
MD at bedside. 

## 2021-08-31 NOTE — ED Notes (Signed)
Patient reports bilateral LE swelling.  Hx of same in past.  Has taken Lasix in past for same ?

## 2021-08-31 NOTE — ED Notes (Signed)
Patient did not provide urine sample after using restroom.  Patient made aware of need for urine.  States he will try again in a few minutes ?

## 2021-08-31 NOTE — Discharge Instructions (Signed)
Take the Lasix as directed 20 mg daily.  Follow-up with urgent care or find a primary care doctor.  Return for any new or worse symptoms. ?

## 2021-08-31 NOTE — ED Provider Notes (Signed)
?[facility] EMERGENCY DEPARTMENT ?Provider Note ? ? ?CSN: [id number] ?Arrival date & time: 08/31/21  1626 ? ?  ? ?History ? ?Chief Complaint  ?Patient presents with  ? Leg Swelling  ? ? ?Larry Adams is a 36 y.o. male. ? ?Patient with a complaint of lower extremity swelling for 2 days.  It is bilateral.  States she has had that in the past.  And has been treated with Lasix.  Patient not very compliant with follow-up.  Past medical history shows concerns for alcohol abuse.  Patient denies any chest pain or any shortness of breath or any abdominal pain. ? ?Past medical history sniffing for back pain anxiety mental disorder drug overdose depression tobacco user.  Drinks 6 cans of beer a day.  Currently no primary care doctor.  Last seen by [place] in October 2022 for anxiety and then seen September 2022 for polysubstance abuse with hospitalization. ? ?Patient's vital signs here temp 98 pulse 99 blood pressure 105/63 sats 97%.  No tremors.  No current signs of alcohol withdrawal. ? ? ?  ? ?Home Medications ?Prior to Admission medications   ?Medication Sig Start Date End Date Taking? Authorizing Provider  ?furosemide (LASIX) 20 MG tablet Take 1 tablet (20 mg total) by mouth daily. 08/31/21  Yes Fredia Sorrow, MD  ?chlordiazePOXIDE (LIBRIUM) 25 MG capsule Take 2 capsules (51m) by mouth three times daily for 1 day, then take 1-2 caps (25-522m twice a day for 1 day, then 1-2 caps (25-505mdaily for 1 day. 04/07/21   Sponseller, RebGypsy BalsamA-C  ?Multiple Vitamin (MULTIVITAMIN WITH MINERALS) TABS tablet Take 1 tablet by mouth daily. 04/02/21   GheCaren GriffinsD  ?sertraline (ZOLOFT) 50 MG tablet Take 1 tablet (50 mg total) by mouth daily. 04/01/21   GheCaren GriffinsD  ?   ? ?Allergies    ?Morphine and related and Vancomycin   ? ?Review of Systems   ?Review of Systems  ?Constitutional:  Negative for chills and fever.  ?HENT:  Negative for ear pain and sore throat.   ?Eyes:  Negative for pain and visual  disturbance.  ?Respiratory:  Negative for cough and shortness of breath.   ?Cardiovascular:  Positive for leg swelling. Negative for chest pain and palpitations.  ?Gastrointestinal:  Negative for abdominal pain and vomiting.  ?Genitourinary:  Negative for dysuria and hematuria.  ?Musculoskeletal:  Negative for arthralgias and back pain.  ?Skin:  Negative for color change and rash.  ?Neurological:  Negative for seizures and syncope.  ?All other systems reviewed and are negative. ? ?Physical Exam ?Updated Vital Signs ?BP (!) 142/74 (BP Location: Right Arm)   Pulse (!) 103   Temp 98 ?F (36.7 ?C) (Oral)   Resp 16   Ht 1.854 m ('6\' 1"' )   Wt 124.7 kg   SpO2 95%   BMI 36.28 kg/m?  ?Physical Exam ?Vitals and nursing note reviewed.  ?Constitutional:   ?   General: He is not in acute distress. ?   Appearance: Normal appearance. He is well-developed.  ?HENT:  ?   Head: Normocephalic and atraumatic.  ?   Mouth/Throat:  ?   Mouth: Mucous membranes are moist.  ?Eyes:  ?   Conjunctiva/sclera: Conjunctivae normal.  ?   Pupils: Pupils are equal, round, and reactive to light.  ?Cardiovascular:  ?   Rate and Rhythm: Normal rate and regular rhythm.  ?   Heart sounds: No murmur heard. ?Pulmonary:  ?   Effort: Pulmonary effort is  normal. No respiratory distress.  ?   Breath sounds: Normal breath sounds.  ?Abdominal:  ?   General: There is distension.  ?   Palpations: Abdomen is soft.  ?   Tenderness: There is no abdominal tenderness. There is no guarding.  ?   Comments: Questionable mild abdominal distention.  Nontender.  No distinct ascites.  ?Musculoskeletal:     ?   General: No swelling.  ?   Cervical back: Normal range of motion and neck supple.  ?   Right lower leg: Edema present.  ?   Left lower leg: Edema present.  ?   Comments: No erythema no chronic skin changes with bilateral pitting edema to the lower extremity.  ?Skin: ?   General: Skin is warm and dry.  ?   Capillary Refill: Capillary refill takes less than 2 seconds.   ?Neurological:  ?   General: No focal deficit present.  ?   Mental Status: He is alert and oriented to person, place, and time.  ?   Cranial Nerves: No cranial nerve deficit.  ?   Sensory: No sensory deficit.  ?   Motor: No weakness.  ?Psychiatric:     ?   Mood and Affect: Mood normal.  ? ? ?ED Results / Procedures / Treatments   ?Labs ?(all labs ordered are listed, but only abnormal results are displayed) ?Labs Reviewed  ?URINALYSIS, ROUTINE W REFLEX MICROSCOPIC - Abnormal; Notable for the following components:  ?    Result Value  ? pH 8.5 (*)   ? Hgb urine dipstick TRACE (*)   ? All other components within normal limits  ?ETHANOL - Abnormal; Notable for the following components:  ? Alcohol, Ethyl (B) 70 (*)   ? All other components within normal limits  ?COMPREHENSIVE METABOLIC PANEL - Abnormal; Notable for the following components:  ? Albumin 2.9 (*)   ? AST 114 (*)   ? ALT 95 (*)   ? All other components within normal limits  ?CBC WITH DIFFERENTIAL/PLATELET - Abnormal; Notable for the following components:  ? RBC 4.05 (*)   ? All other components within normal limits  ?URINALYSIS, MICROSCOPIC (REFLEX) - Abnormal; Notable for the following components:  ? Bacteria, UA RARE (*)   ? All other components within normal limits  ?LIPASE, BLOOD  ?BRAIN NATRIURETIC PEPTIDE  ? ? ?EKG ?EKG Interpretation ? ?Date/Time:  Sunday August 31 2021 20:15:36 EST ?Ventricular Rate:  97 ?PR Interval:  127 ?QRS Duration: 98 ?QT Interval:  351 ?QTC Calculation: 446 ?R Axis:   61 ?Text Interpretation: Sinus rhythm Confirmed by Fredia Sorrow [phone]) on 08/31/2021 8:45:09 PM ? ?Radiology ?DG Chest Port 1 View ? ?Result Date: 08/31/2021 ?CLINICAL DATA:  Bilateral lower extremity swelling. EXAM: PORTABLE CHEST 1 VIEW COMPARISON:  Chest x-ray 04/07/2021 FINDINGS: The heart is enlarged. There is central pulmonary vascular congestion. There is some atelectasis in the bases. There is no pleural effusion or pneumothorax. No acute fractures are  identified. IMPRESSION: 1. Cardiomegaly with central pulmonary vascular congestion. Electronically Signed   By: Ronney Asters M.D.   On: 08/31/2021 20:16   ? ?Procedures ?Procedures  ? ? ?Medications Ordered in ED ?Medications  ?furosemide (LASIX) tablet 40 mg (has no administration in time range)  ? ? ?ED Course/ Medical Decision Making/ A&P ?  ?                        ?Medical Decision Making ?Amount and/or Complexity of Data  Reviewed ?Labs: ordered. ?Radiology: ordered. ? ?Risk ?Prescription drug management. ? ?Work-up here fairly reassuring.  Urinalysis negative.  Chest x-ray with may be some pulmonary vascular congestion but no pulmonary edema.  BNP is not elevated.  No leukocytosis hemoglobin 13.2.  Lipase normal complete metabolic panel AST up at 799 ALT 95 total bili is normal alk phos is normal kidney function is normal with GFR greater than 60.  Electrolytes are normal. ? ?We will treat patient with 20 mg of Lasix daily and have him follow-up with urgent care or getting a new primary care doctor.  Will give first dose here tonight 40 mg p.o. ? ?Final Clinical Impression(s) / ED Diagnoses ?Final diagnoses:  ?Bilateral lower extremity edema  ? ? ?Rx / DC Orders ?ED Discharge Orders   ? ?      Ordered  ?  furosemide (LASIX) 20 MG tablet  Daily       ? 08/31/21 2149  ? ?  ?  ? ?  ? ? ?  ?Fredia Sorrow, MD ?08/31/21 2150 ? ?

## 2021-08-31 NOTE — ED Notes (Signed)
Portable xray at bedside.

## 2021-08-31 NOTE — ED Triage Notes (Signed)
Pt c/o swelling to B/L legs noticed few days ago. Pt denies injury. ?

## 2021-08-31 NOTE — ED Notes (Signed)
Patient resting quietly at this time.  No complaints voiced.  No signs of respiratory distress noted.  ?

## 2021-09-12 ENCOUNTER — Encounter (HOSPITAL_COMMUNITY): Payer: Self-pay | Admitting: Emergency Medicine

## 2021-09-12 ENCOUNTER — Other Ambulatory Visit: Payer: Self-pay

## 2021-09-12 ENCOUNTER — Emergency Department (HOSPITAL_COMMUNITY)
Admission: EM | Admit: 2021-09-12 | Discharge: 2021-09-12 | Disposition: A | Discharge status: Home / Self Care | Payer: Managed Care, Other (non HMO) | Attending: Emergency Medicine | Admitting: Emergency Medicine

## 2021-09-12 DIAGNOSIS — Y908 Blood alcohol level of 240 mg/100 ml or more: Secondary | ICD-10-CM | POA: Insufficient documentation

## 2021-09-12 DIAGNOSIS — F1020 Alcohol dependence, uncomplicated: Secondary | ICD-10-CM | POA: Insufficient documentation

## 2021-09-12 LAB — COMPREHENSIVE METABOLIC PANEL
ALT: 43 U/L (ref 0–44)
AST: 59 U/L — ABNORMAL HIGH (ref 15–41)
Albumin: 3.2 g/dL — ABNORMAL LOW (ref 3.5–5.0)
Alkaline Phosphatase: 116 U/L (ref 38–126)
Anion gap: 9 (ref 5–15)
BUN: 6 mg/dL (ref 6–20)
CO2: 23 mmol/L (ref 22–32)
Calcium: 8.8 mg/dL — ABNORMAL LOW (ref 8.9–10.3)
Chloride: 107 mmol/L (ref 98–111)
Creatinine, Ser: 0.86 mg/dL (ref 0.61–1.24)
GFR, Estimated: >60 mL/min (ref >60)
Glucose, Bld: 116 mg/dL — ABNORMAL HIGH (ref 70–99)
Potassium: 3.4 mmol/L — ABNORMAL LOW (ref 3.5–5.1)
Sodium: 139 mmol/L (ref 135–145)
Total Bilirubin: 1 mg/dL (ref 0.3–1.2)
Total Protein: 7.7 g/dL (ref 6.5–8.1)

## 2021-09-12 LAB — CBC
HCT: 40.1 % (ref 39.0–52.0)
Hemoglobin: 13.8 g/dL (ref 13.0–17.0)
MCH: 32.8 pg (ref 26.0–34.0)
MCHC: 34.4 g/dL (ref 30.0–36.0)
MCV: 95.2 fL (ref 80.0–100.0)
Platelets: 229 10*3/uL (ref 150–400)
RBC: 4.21 MIL/uL — ABNORMAL LOW (ref 4.22–5.81)
RDW: 13.2 % (ref 11.5–15.5)
WBC: 7.7 10*3/uL (ref 4.0–10.5)
nRBC: 0 % (ref 0.0–0.2)

## 2021-09-12 LAB — RAPID URINE DRUG SCREEN, HOSP PERFORMED
Amphetamines: NOT DETECTED
Barbiturates: NOT DETECTED
Benzodiazepines: POSITIVE — AB
Cocaine: NOT DETECTED
Opiates: NOT DETECTED
Tetrahydrocannabinol: NOT DETECTED

## 2021-09-12 LAB — ETHANOL: Alcohol, Ethyl (B): 259 mg/dL — ABNORMAL HIGH (ref <10)

## 2021-09-12 MED ORDER — CHLORDIAZEPOXIDE HCL 25 MG PO CAPS
25.0000 mg | ORAL_CAPSULE | Freq: Once | ORAL | Status: AC
  Administered 2021-09-12: 25 mg via ORAL
  Filled 2021-09-12: qty 1

## 2021-09-12 MED ORDER — CHLORDIAZEPOXIDE HCL 25 MG PO CAPS: ORAL_CAPSULE | ORAL | 0 refills | Status: DC

## 2021-09-12 NOTE — ED Triage Notes (Signed)
Pt expressed wish to stop drinking.  Drinks "all day every day"  The last time he states he stopped drinking was while incarcerated in [facility] and at that time "woke up in the ICU" after having seizures.  Pt states he wants to stop drinking and would like a prescription for librium so he can do this on his own.  States he is moving in with his father, is going to work, and does not have time for inpatient program. Last drink today.  States "I am still drinking"  ?

## 2021-09-12 NOTE — Discharge Instructions (Addendum)
Substance Abuse Treatment Programs ° °Intensive Outpatient Programs °[facility]     °[address]      °[place], [place]                   °[phone]      ° °[facility] °[address] °[place], [place] °[phone] ° °[facility]     °(Inpatient and outpatient)     °[address]           °[phone]   ° °[facility] °[phone] (Suboxone and Methadone) ° °[address]      °[place], [place] [address]      °[phone]      ° °[address] °[place], [place] °[phone] ° °[facility] (Outpatient/Inpatient, Chemical)    °(insurance only) [phone]      °       °[facility] (Groups & Residential) °[place], [place] °336-389-1413 ° °   °[facility]     °[address]     °[place], [place]      °[phone]      ° °[facility] (for caregivers and family) °[address] °[place], [place] °336-299-4655 ° ° ° ° ° °Residential Treatment Programs °[facility]      °[address], [place], [place] [address]  °[phone]      ° °[facility] °[address] [place], [place] [address] °[phone] ° °[facility]        °336-248-8914      ° °[facility] °1-800-659-3381 ° °[facility]             °[address]                                         °[place], [place]                                                °877-615-2722 or 336-784-9470                              ° °[facility] [place] °[address] °[place], [address] °1.877.941.8954 ° °[facility]    °[address]      °[place], [place]     °[phone]      ° °[facility] °[address] °, [place] °336-285-9073 ° °[facility]   °[address]     °[place], [place] [address]     °[phone]      °Admissions: 8am-3pm M-F ° °[facility] °[address] °[place] °336-227-7417 ° °[facility]: Residential Program (90 Days)   °[place], [place]      °336-725-8389 or 800-758-6077    ° °[facility] °[place],  °(Walk in Hours over the weekend or by referral) ° °[facility] °[address], [place],  [address] °([phone] ° °Crisis Mobile: [facility]:  1-877-626-1772 (for crisis response 24 hours a day) °[facility] Hotline:      1-800-256-2452 °Outpatient Psychiatry and Counseling ° °[facility]: Mobile Crisis   Management 24 hours:  1-877-626-1772 ° °[facility] sliding scale fee and walk in schedule: M-F 8am-12pm/1pm-3pm °[address]  °[place], [place] [address] °[phone] ° °[facility] °[address] °[place], [place] [address] °[phone] ° °[facility] (Former[facility] known [facility])- new patient walk-in appointments available Monday - Friday 8am -3pm.          °[address] °[place], [place] [address] °336-676-6840 or crisis line- 336-676-6905 ° °[facility]/ Intensive Outpatient Therapy Program °[address] °[place], [place] [address] °336-832-9804 ° °[facility]                  °Crisis Services      °[phone]      °[address]     °[place], [place] [address]                ° °[facility]   °[facility] °[phone] °[address] °[place], [place] [address] ° ° °[facility]          °[address]  °[place], [place] [address]       °([phone] ° °[facility] °[address] °[place], [place] [address] °336-292-1510 ° °[facility]    °[address]    °[place], [place] [address]     °[phone]      ° °Parish McKinney, MD     °[address]     °[place] [address]     °336-282-1251     °  °[facility] °[address] °[place] [address] ° °[facility]     °[address]     °[place], [place]      °336-542-2076      ° °[facility] °Shamsher Ahluwalia, MD °[address] °[place], [place] [address] °[phone] ° °[facility]     °[address]     °[place], [place] [address]     °336-274-1237      ° °[facility] °[address] °[place], [place] [address] °336-854-4450 °Resources for Temporary Residential Assistance/Crisis Centers ° °DAY CENTERS °[facility] °M-F 8am-3pm   °[address] [place], [place] [address]   336-332-0824 °Services include: laundry, barbering, support groups, case management, phone  & computer access, showers, AA/NA mtgs, mental health/substance abuse nurse, job skills class, disability information, VA assistance, spiritual classes, etc.  ° °HOMELESS SHELTERS ° °[facility]     °[facility]   °[address], [place]     °[phone]       °       °[facility] (women and children)       °[address] °[place], [place] [address] °336-275-0820 °[email or web address] for application and process °Application Required ° °[facility]   °[address]    °[place] [address]     °[phone]       °             °[facility] °[address] °[place],  [address] °[phone] °336-235-0363(schedule application appt.) °Application Required ° °[facility] (women only)    °[address]     °[place],  [address]     °336-884-1039      °  Intake starts 6pm daily °Need valid ID, SSC, & Police report °[facility] [place] °[address] °[place], [place] °336-881-5420 °Application Required ° °[facility] (men only)     °[address]      °[place], [place]     °[phone]      ° °[facility] °(Pregnant women only) °[address] °[place], [place] °336-275-0206 ° °[facility]      °[address]      °[place], [place] [address]     °[phone]      °       °[facility] °[address] °[place], [place] °336-723-1848 °90 day commitment/SA/Application process ° °[facility](men only)     °[address]     °[place], [place]     °[phone]       °Check-in at 7pm     °       °[facility] °[address] °[place], [place] [address] °336-248-6684 °Men/Women/Women and Children must be there by 7 pm ° °[facility] °[place],  °336-722-8721                ° °

## 2021-09-12 NOTE — ED Notes (Signed)
?  Needs a prescription refill ?

## 2021-09-12 NOTE — ED Notes (Signed)
Needs

## 2021-09-16 ENCOUNTER — Emergency Department (HOSPITAL_COMMUNITY)
Admission: EM | Admit: 2021-09-16 | Discharge: 2021-09-16 | Disposition: A | Discharge status: Home / Self Care | Payer: Managed Care, Other (non HMO) | Attending: Emergency Medicine | Admitting: Emergency Medicine

## 2021-09-16 ENCOUNTER — Emergency Department (HOSPITAL_COMMUNITY): Payer: Managed Care, Other (non HMO)

## 2021-09-16 DIAGNOSIS — F10920 Alcohol use, unspecified with intoxication, uncomplicated: Secondary | ICD-10-CM | POA: Diagnosis not present

## 2021-09-16 DIAGNOSIS — Y906 Blood alcohol level of 120-199 mg/100 ml: Secondary | ICD-10-CM | POA: Diagnosis not present

## 2021-09-16 DIAGNOSIS — M7989 Other specified soft tissue disorders: Secondary | ICD-10-CM | POA: Diagnosis present

## 2021-09-16 LAB — BASIC METABOLIC PANEL
Anion gap: 9 (ref 5–15)
BUN: 7 mg/dL (ref 6–20)
CO2: 26 mmol/L (ref 22–32)
Calcium: 8.6 mg/dL — ABNORMAL LOW (ref 8.9–10.3)
Chloride: 100 mmol/L (ref 98–111)
Creatinine, Ser: 0.84 mg/dL (ref 0.61–1.24)
GFR, Estimated: >60 mL/min (ref >60)
Glucose, Bld: 97 mg/dL (ref 70–99)
Potassium: 3.5 mmol/L (ref 3.5–5.1)
Sodium: 135 mmol/L (ref 135–145)

## 2021-09-16 LAB — CBC WITH DIFFERENTIAL/PLATELET
Abs Immature Granulocytes: 0.01 10*3/uL (ref 0.00–0.07)
Basophils Absolute: 0.1 10*3/uL (ref 0.0–0.1)
Basophils Relative: 1 %
Eosinophils Absolute: 0.3 10*3/uL (ref 0.0–0.5)
Eosinophils Relative: 4 %
HCT: 41 % (ref 39.0–52.0)
Hemoglobin: 14.3 g/dL (ref 13.0–17.0)
Immature Granulocytes: 0 %
Lymphocytes Relative: 46 %
Lymphs Abs: 3.4 10*3/uL (ref 0.7–4.0)
MCH: 32.6 pg (ref 26.0–34.0)
MCHC: 34.9 g/dL (ref 30.0–36.0)
MCV: 93.6 fL (ref 80.0–100.0)
Monocytes Absolute: 0.8 10*3/uL (ref 0.1–1.0)
Monocytes Relative: 10 %
Neutro Abs: 2.9 10*3/uL (ref 1.7–7.7)
Neutrophils Relative %: 39 %
Platelets: 231 10*3/uL (ref 150–400)
RBC: 4.38 MIL/uL (ref 4.22–5.81)
RDW: 13.1 % (ref 11.5–15.5)
WBC: 7.4 10*3/uL (ref 4.0–10.5)
nRBC: 0 % (ref 0.0–0.2)

## 2021-09-16 LAB — BRAIN NATRIURETIC PEPTIDE: B Natriuretic Peptide: 10.8 pg/mL (ref 0.0–100.0)

## 2021-09-16 LAB — ETHANOL: Alcohol, Ethyl (B): 195 mg/dL — ABNORMAL HIGH (ref <10)

## 2021-09-16 MED ORDER — ONDANSETRON 4 MG PO TBDP
4.0000 mg | ORAL_TABLET | Freq: Once | ORAL | Status: AC
  Administered 2021-09-16: 4 mg via ORAL
  Filled 2021-09-16: qty 1

## 2021-09-16 NOTE — ED Provider Notes (Signed)
?[facility] DEPT ?Provider Note ? ? ?CSN: [id number] ?Arrival date & time: 09/16/21  1657 ? ?  ? ?History ? ?Chief Complaint  ?Patient presents with  ? Leg Swelling  ? ? ?Larry Adams is a 36 y.o. male. ? ?36 year old male with prior medical history as detailed below presents for evaluation.  Patient reports that he has had persistent swelling to his bilateral lower extremities for quite some time.  Patient complains that his swelling has not improved after being prescribed Lasix with last ER evaluation. ?Patient continues to drink alcohol every day.  His last alcohol intake was just prior to coming into the ED today.  He is clinically intoxicated. ? ?He denies chest pain or shortness of breath. ? ?He complains of small "pimple" to the dorsal aspect of the left wrist. ? ?The history is provided by the patient and medical records.  ?Illness ?Location:  Lower extremity edema, alcohol intoxication ?Severity:  Mild ?Onset quality:  Gradual ?Duration:  6 weeks ?Timing:  Constant ?Progression:  Waxing and waning ?Chronicity:  Chronic ? ?  ? ?Home Medications ?Prior to Admission medications   ?Medication Sig Start Date End Date Taking? Authorizing Provider  ?chlordiazePOXIDE (LIBRIUM) 25 MG capsule Take 2 capsules (50mg ) by mouth three times daily for 1 day, then take 1-2 caps (25-50mg ) twice a day for 1 day, then 1-2 caps (25-50mg ) daily for 1 day. 09/12/21   Varney Biles, MD  ?furosemide (LASIX) 20 MG tablet Take 1 tablet (20 mg total) by mouth daily. 08/31/21   Fredia Sorrow, MD  ?Multiple Vitamin (MULTIVITAMIN WITH MINERALS) TABS tablet Take 1 tablet by mouth daily. 04/02/21   Caren Griffins, MD  ?sertraline (ZOLOFT) 50 MG tablet Take 1 tablet (50 mg total) by mouth daily. 04/01/21   Caren Griffins, MD  ?   ? ?Allergies    ?Morphine and related and Vancomycin   ? ?Review of Systems   ?Review of Systems  ?All other systems reviewed and are negative. ? ?Physical Exam ?Updated Vital  Signs ?BP 131/86   Pulse (!) 102   Temp 98 ?F (36.7 ?C) (Oral)   Resp 17   Ht 6' (1.829 m)   Wt 122.5 kg   SpO2 94%   BMI 36.62 kg/m?  ?Physical Exam ?Vitals and nursing note reviewed.  ?Constitutional:   ?   General: He is not in acute distress. ?   Appearance: Normal appearance. He is well-developed.  ?HENT:  ?   Head: Normocephalic and atraumatic.  ?Eyes:  ?   Conjunctiva/sclera: Conjunctivae normal.  ?   Pupils: Pupils are equal, round, and reactive to light.  ?Cardiovascular:  ?   Rate and Rhythm: Normal rate and regular rhythm.  ?   Heart sounds: Normal heart sounds.  ?Pulmonary:  ?   Effort: Pulmonary effort is normal. No respiratory distress.  ?   Breath sounds: Normal breath sounds.  ?Abdominal:  ?   General: There is no distension.  ?   Palpations: Abdomen is soft.  ?   Tenderness: There is no abdominal tenderness.  ?Musculoskeletal:     ?   General: No deformity. Normal range of motion.  ?   Cervical back: Normal range of motion and neck supple.  ?   Right lower leg: Edema present.  ?   Left lower leg: Edema present.  ?   Comments: 1+ edema to both lower extremities  ?Skin: ?   General: Skin is warm and dry.  ?  Comments: Small furuncle noted to the dorsal aspect of the left wrist.  No surrounding erythema.  Minimal drainage expressed with compression.  No indication for I&D.  ?Neurological:  ?   General: No focal deficit present.  ?   Mental Status: He is alert and oriented to person, place, and time.  ? ? ?ED Results / Procedures / Treatments   ?Labs ?(all labs ordered are listed, but only abnormal results are displayed) ?Labs Reviewed  ?BASIC METABOLIC PANEL - Abnormal; Notable for the following components:  ?    Result Value  ? Calcium 8.6 (*)   ? All other components within normal limits  ?ETHANOL - Abnormal; Notable for the following components:  ? Alcohol, Ethyl (B) 195 (*)   ? All other components within normal limits  ?BRAIN NATRIURETIC PEPTIDE  ?CBC WITH DIFFERENTIAL/PLATELET   ? ? ?EKG ?None ? ?Radiology ?DG Chest Port 1 View ? ?Result Date: 09/16/2021 ?CLINICAL DATA:  Dyspnea. EXAM: PORTABLE CHEST 1 VIEW COMPARISON:  Chest x-ray 08/31/2021. FINDINGS: Cardiac silhouette is borderline enlarged. There is central pulmonary vascular congestion. There are minimal atelectatic changes in the lung bases. There is no pleural effusion or pneumothorax. No acute fractures are seen. IMPRESSION: 1. Borderline cardiomegaly with central pulmonary vascular congestion. Electronically Signed   By: Ronney Asters M.D.   On: 09/16/2021 18:28   ? ?Procedures ?Procedures  ? ? ?Medications Ordered in ED ?Medications  ?ondansetron (ZOFRAN-ODT) disintegrating tablet 4 mg (4 mg Oral Given 09/16/21 1900)  ? ? ?ED Course/ Medical Decision Making/ A&P ?  ?                        ?Medical Decision Making ?Amount and/or Complexity of Data Reviewed ?Labs: ordered. ?Radiology: ordered. ? ?Risk ?Prescription drug management. ? ? ? ?Medical Screen Complete ? ?This patient presented to the ED with complaint of lower extremity edema. ? ?This complaint involves an extensive number of treatment options. The initial differential diagnosis includes, but is not limited to, low extremity edema, metabolic abnormality, etc. ? ?This presentation is: Chronic, Self-Limited, Previously Undiagnosed, Uncertain Prognosis, Complicated, Systemic Symptoms, and Threat to Life/Bodily Function ? ?Patient is presenting with complaint of chronic lower extremity edema.  Patient reports that he was given Lasix that STD evaluation.  He reports that with use of Lasix symptoms not improved. ? ?Of note, patient continues to drink the alcohol in large quantities every day. ? ?Patient is clinically intoxicated today. ?Work-up today is without evidence of significant acute pathology. ? ?Patient is clinically sober upon discharge.  He  does understand need for close outpatient follow-up.  Strict return precautions given and understood. ? ?Co morbidities that  complicated the patient's evaluation ? ?Chronic alcohol abuse ? ? ?Additional history obtained: ? ?External records from outside sources obtained and reviewed including prior ED visits and prior Inpatient records.  ? ? ?Lab Tests: ? ?I ordered and personally interpreted labs.  The pertinent results include: CBC BMP, EtOH, BNP ? ? ?Imaging Studies ordered: ? ?I ordered imaging studies including chest x-ray ?I independently visualized and interpreted obtained imaging which showed NAD ?I agree with the radiologist interpretation. ? ?Problem List / ED Course: ? ?Lower extremity edema ? ? ?Reevaluation: ? ?After the interventions noted above, I reevaluated the patient and found that they have: stayed the same ? ? ?Disposition: ? ?After consideration of the diagnostic results and the patients response to treatment, I feel that the patent would benefit from close  outpatient follow-up.  ? ? ? ? ? ? ? ? ?Final Clinical Impression(s) / ED Diagnoses ?Final diagnoses:  ?Leg swelling  ?Alcoholic intoxication without complication ([place])  ? ? ?Rx / DC Orders ?ED Discharge Orders   ? ? None  ? ?  ? ? ?  ?Valarie Merino, MD ?09/16/21 2325 ? ?

## 2021-09-16 NOTE — ED Notes (Signed)
Patient verbalizes understanding of discharge instructions. Opportunity for questioning and answers were provided. Armband removed by staff, pt discharged from ED. Ambulated out to lobby  

## 2021-09-16 NOTE — ED Notes (Signed)
Patient transported to X-ray 

## 2021-09-16 NOTE — Discharge Instructions (Signed)
Return for any problem.  ?

## 2021-09-16 NOTE — ED Triage Notes (Signed)
Pt states he is having bilateral leg swelling. Pt also reports a "spider bite" to left wrist area. ?

## 2021-09-18 NOTE — ED Provider Notes (Signed)
?[facility]-EMERGENCY DEPT ?Provider Note ? ? ?CSN: [id number] ?Arrival date & time: 09/12/21  1908 ? ?  ? ?History ? ?Chief Complaint  ?Patient presents with  ? Alcohol Detox  ? ? ?Larry Adams is a 36 y.o. male. ? ?HPI ? ?  ?36 y/o M comes in to the ER with cc of detox. ?Indicates that he is an alcoholic and wants to stop drinking. He has had DTs in the past. Last drink was prior to ER arrival. Currently has no cp, palpitations, n/v, dib.  ? ? ?Home Medications ?Prior to Admission medications   ?Medication Sig Start Date End Date Taking? Authorizing Provider  ?chlordiazePOXIDE (LIBRIUM) 25 MG capsule Take 2 capsules (50mg ) by mouth three times daily for 1 day, then take 1-2 caps (25-50mg ) twice a day for 1 day, then 1-2 caps (25-50mg ) daily for 1 day. 09/12/21   09/14/21, MD  ?furosemide (LASIX) 20 MG tablet Take 1 tablet (20 mg total) by mouth daily. 08/31/21   10/31/21, MD  ?Multiple Vitamin (MULTIVITAMIN WITH MINERALS) TABS tablet Take 1 tablet by mouth daily. 04/02/21   06/02/21, MD  ?sertraline (ZOLOFT) 50 MG tablet Take 1 tablet (50 mg total) by mouth daily. 04/01/21   06/01/21, MD  ?   ? ?Allergies    ?Morphine and related and Vancomycin   ? ?Review of Systems   ?Review of Systems  ?All other systems reviewed and are negative. ? ?Physical Exam ?Updated Vital Signs ?BP (!) 139/95 (BP Location: Right Arm)   Pulse (!) 108   Temp 98 ?F (36.7 ?C) (Oral)   SpO2 100%  ?Physical Exam ?Vitals and nursing note reviewed.  ?Constitutional:   ?   Appearance: He is well-developed.  ?HENT:  ?   Head: Atraumatic.  ?Eyes:  ?   Extraocular Movements: Extraocular movements intact.  ?Cardiovascular:  ?   Rate and Rhythm: Normal rate.  ?Pulmonary:  ?   Effort: Pulmonary effort is normal.  ?Musculoskeletal:  ?   Cervical back: Neck supple.  ?Skin: ?   General: Skin is warm.  ?Neurological:  ?   Mental Status: He is alert and oriented to person, place, and time.  ?Psychiatric:      ?   Mood and Affect: Mood normal.     ?   Thought Content: Thought content normal.  ? ? ?ED Results / Procedures / Treatments   ?Labs ?(all labs ordered are listed, but only abnormal results are displayed) ?Labs Reviewed  ?COMPREHENSIVE METABOLIC PANEL - Abnormal; Notable for the following components:  ?    Result Value  ? Potassium 3.4 (*)   ? Glucose, Bld 116 (*)   ? Calcium 8.8 (*)   ? Albumin 3.2 (*)   ? AST 59 (*)   ? All other components within normal limits  ?ETHANOL - Abnormal; Notable for the following components:  ? Alcohol, Ethyl (B) 259 (*)   ? All other components within normal limits  ?CBC - Abnormal; Notable for the following components:  ? RBC 4.21 (*)   ? All other components within normal limits  ?RAPID URINE DRUG SCREEN, HOSP PERFORMED - Abnormal; Notable for the following components:  ? Benzodiazepines POSITIVE (*)   ? All other components within normal limits  ? ? ?EKG ?None ? ?Radiology ?No results found. ? ?Procedures ?Procedures  ? ? ?Medications Ordered in ED ?Medications  ?chlordiazePOXIDE (LIBRIUM) capsule 25 mg (25 mg Oral Given 09/12/21 2125)  ? ? ?  ED Course/ Medical Decision Making/ A&P ?  ?                        ?Medical Decision Making ?Amount and/or Complexity of Data Reviewed ?Labs: ordered. ? ?Risk ?Prescription drug management. ? ? ? ?Final Clinical Impression(s) / ED Diagnoses ?Final diagnoses:  ?Alcoholism (HCC)  ?Pt with alcoholism comes in with cc of detox. ?He is currently intoxicated, not having any withdrawals. ?Labs ordered, reviewed - all reassuring besides some nonspecific changes that are likely due to alcohol use. ?Stable for d/c ? ? ?Rx / DC Orders ?ED Discharge Orders   ? ?      Ordered  ?  chlordiazePOXIDE (LIBRIUM) 25 MG capsule       ? 09/12/21 2118  ? ?  ?  ? ?  ? ? ?  ?Derwood Kaplan, MD ?09/18/21 2110 ? ?

## 2021-09-29 ENCOUNTER — Emergency Department (HOSPITAL_COMMUNITY)
Admission: EM | Admit: 2021-09-29 | Discharge: 2021-09-30 | Disposition: A | Discharge status: Home / Self Care | Payer: Managed Care, Other (non HMO) | Attending: Emergency Medicine | Admitting: Emergency Medicine

## 2021-09-29 ENCOUNTER — Other Ambulatory Visit: Payer: Self-pay

## 2021-09-29 ENCOUNTER — Emergency Department (HOSPITAL_COMMUNITY): Payer: Managed Care, Other (non HMO)

## 2021-09-29 ENCOUNTER — Encounter (HOSPITAL_COMMUNITY): Payer: Self-pay

## 2021-09-29 DIAGNOSIS — R739 Hyperglycemia, unspecified: Secondary | ICD-10-CM | POA: Insufficient documentation

## 2021-09-29 DIAGNOSIS — R000 Tachycardia, unspecified: Secondary | ICD-10-CM | POA: Diagnosis not present

## 2021-09-29 DIAGNOSIS — F10129 Alcohol abuse with intoxication, unspecified: Secondary | ICD-10-CM | POA: Insufficient documentation

## 2021-09-29 DIAGNOSIS — R609 Edema, unspecified: Secondary | ICD-10-CM

## 2021-09-29 DIAGNOSIS — R600 Localized edema: Secondary | ICD-10-CM | POA: Insufficient documentation

## 2021-09-29 DIAGNOSIS — Y908 Blood alcohol level of 240 mg/100 ml or more: Secondary | ICD-10-CM | POA: Insufficient documentation

## 2021-09-29 DIAGNOSIS — R0600 Dyspnea, unspecified: Secondary | ICD-10-CM | POA: Insufficient documentation

## 2021-09-29 DIAGNOSIS — E876 Hypokalemia: Secondary | ICD-10-CM | POA: Insufficient documentation

## 2021-09-29 DIAGNOSIS — Z79899 Other long term (current) drug therapy: Secondary | ICD-10-CM | POA: Insufficient documentation

## 2021-09-29 LAB — COMPREHENSIVE METABOLIC PANEL
ALT: 25 U/L (ref 0–44)
AST: 86 U/L — ABNORMAL HIGH (ref 15–41)
Albumin: 3.4 g/dL — ABNORMAL LOW (ref 3.5–5.0)
Alkaline Phosphatase: 118 U/L (ref 38–126)
Anion gap: 10 (ref 5–15)
BUN: 6 mg/dL (ref 6–20)
CO2: 26 mmol/L (ref 22–32)
Calcium: 8.7 mg/dL — ABNORMAL LOW (ref 8.9–10.3)
Chloride: 101 mmol/L (ref 98–111)
Creatinine, Ser: 0.87 mg/dL (ref 0.61–1.24)
GFR, Estimated: >60 mL/min (ref >60)
Glucose, Bld: 128 mg/dL — ABNORMAL HIGH (ref 70–99)
Potassium: 2.6 mmol/L — CL (ref 3.5–5.1)
Sodium: 137 mmol/L (ref 135–145)
Total Bilirubin: 2.3 mg/dL — ABNORMAL HIGH (ref 0.3–1.2)
Total Protein: 7.6 g/dL (ref 6.5–8.1)

## 2021-09-29 LAB — CBC WITH DIFFERENTIAL/PLATELET
Abs Immature Granulocytes: 0.01 10*3/uL (ref 0.00–0.07)
Basophils Absolute: 0.1 10*3/uL (ref 0.0–0.1)
Basophils Relative: 1 %
Eosinophils Absolute: 0.2 10*3/uL (ref 0.0–0.5)
Eosinophils Relative: 4 %
HCT: 40.1 % (ref 39.0–52.0)
Hemoglobin: 13.9 g/dL (ref 13.0–17.0)
Immature Granulocytes: 0 %
Lymphocytes Relative: 52 %
Lymphs Abs: 3.3 10*3/uL (ref 0.7–4.0)
MCH: 32 pg (ref 26.0–34.0)
MCHC: 34.7 g/dL (ref 30.0–36.0)
MCV: 92.2 fL (ref 80.0–100.0)
Monocytes Absolute: 0.9 10*3/uL (ref 0.1–1.0)
Monocytes Relative: 14 %
Neutro Abs: 1.9 10*3/uL (ref 1.7–7.7)
Neutrophils Relative %: 29 %
Platelets: 142 10*3/uL — ABNORMAL LOW (ref 150–400)
RBC: 4.35 MIL/uL (ref 4.22–5.81)
RDW: 13.1 % (ref 11.5–15.5)
WBC: 6.4 10*3/uL (ref 4.0–10.5)
nRBC: 0 % (ref 0.0–0.2)

## 2021-09-29 LAB — ETHANOL: Alcohol, Ethyl (B): 294 mg/dL — ABNORMAL HIGH (ref <10)

## 2021-09-29 LAB — BRAIN NATRIURETIC PEPTIDE: B Natriuretic Peptide: 6.9 pg/mL (ref 0.0–100.0)

## 2021-09-29 MED ORDER — POTASSIUM CHLORIDE CRYS ER 20 MEQ PO TBCR
40.0000 meq | EXTENDED_RELEASE_TABLET | Freq: Once | ORAL | Status: AC
  Administered 2021-09-29: 40 meq via ORAL
  Filled 2021-09-29: qty 2

## 2021-09-29 MED ORDER — POTASSIUM CHLORIDE CRYS ER 20 MEQ PO TBCR: 40.0000 meq | EXTENDED_RELEASE_TABLET | Freq: Every day | ORAL | 0 refills | Status: DC

## 2021-09-29 MED ORDER — POTASSIUM CHLORIDE 10 MEQ/100ML IV SOLN
10.0000 meq | INTRAVENOUS | Status: AC
  Administered 2021-09-29 (×2): 10 meq via INTRAVENOUS
  Filled 2021-09-29 (×2): qty 100

## 2021-09-29 NOTE — ED Triage Notes (Signed)
Pt reports with bilateral leg and feet swelling x 2 weeks. Pt reports taking Lasix x 1 month. He also states that he drinks a case of beer a day and his abdomen is swelling.  ?

## 2021-09-29 NOTE — ED Provider Notes (Signed)
?[facility]-EMERGENCY DEPT ?Provider Note ? ? ?CSN: [id number] ?Arrival date & time: 09/29/21  2004 ? ?  ? ?History ? ?Chief Complaint  ?Patient presents with  ? Leg Swelling  ? Foot Swelling  ? ? ?Larry Adams is a 36 y.o. male. ? ?36 year old male presents today for evaluation of peripheral edema and occasional exertional dyspnea.  Patient denies history of CHF.  States this has been ongoing for several weeks.  Patient also endorses heavy drinking.  He does also appear to be intoxicated during exam.  Denies chest pain, lightheadedness, palpitations, abdominal distention, orthopnea, PND.  States he occasionally takes Lasix when that is prescribed to him.  Does not have a primary care provider and seeks his care in the emergency room.  Has had no formal cardiac testing.  Dad is also at bedside. ? ?The history is provided by the patient and a parent. No language interpreter was used.  ? ?  ? ?Home Medications ?Prior to Admission medications   ?Medication Sig Start Date End Date Taking? Authorizing Provider  ?chlordiazePOXIDE (LIBRIUM) 25 MG capsule Take 2 capsules (50mg ) by mouth three times daily for 1 day, then take 1-2 caps (25-50mg ) twice a day for 1 day, then 1-2 caps (25-50mg ) daily for 1 day. 09/12/21   09/14/21, MD  ?furosemide (LASIX) 20 MG tablet Take 1 tablet (20 mg total) by mouth daily. 08/31/21   10/31/21, MD  ?Multiple Vitamin (MULTIVITAMIN WITH MINERALS) TABS tablet Take 1 tablet by mouth daily. 04/02/21   06/02/21, MD  ?sertraline (ZOLOFT) 50 MG tablet Take 1 tablet (50 mg total) by mouth daily. 04/01/21   06/01/21, MD  ?   ? ?Allergies    ?Morphine and related and Vancomycin   ? ?Review of Systems   ?Review of Systems  ?Constitutional:  Negative for chills and fever.  ?Respiratory:  Positive for shortness of breath (Occasional exertional dyspnea). Negative for cough.   ?Cardiovascular:  Positive for leg swelling. Negative for chest pain and  palpitations.  ?Gastrointestinal:  Negative for abdominal distention, nausea and vomiting.  ?Neurological:  Negative for syncope and light-headedness.  ?All other systems reviewed and are negative. ? ?Physical Exam ?Updated Vital Signs ?BP (!) 142/100 (BP Location: Left Arm)   Pulse (!) 110   Temp 98.2 ?F (36.8 ?C) (Oral)   Resp 18   Ht 6\' 1"  (1.854 m)   Wt 133 kg   SpO2 100%   BMI 38.68 kg/m?  ?Physical Exam ?Vitals and nursing note reviewed.  ?Constitutional:   ?   General: He is not in acute distress. ?   Appearance: Normal appearance. He is not ill-appearing.  ?HENT:  ?   Head: Normocephalic and atraumatic.  ?   Nose: Nose normal.  ?Eyes:  ?   General: No scleral icterus. ?   Extraocular Movements: Extraocular movements intact.  ?   Conjunctiva/sclera: Conjunctivae normal.  ?Cardiovascular:  ?   Rate and Rhythm: Regular rhythm. Tachycardia present.  ?   Pulses: Normal pulses.  ?   Heart sounds: Normal heart sounds.  ?Pulmonary:  ?   Effort: Pulmonary effort is normal. No respiratory distress.  ?   Breath sounds: Normal breath sounds. No wheezing or rales.  ?Abdominal:  ?   General: There is no distension.  ?   Tenderness: There is no abdominal tenderness. There is no guarding.  ?Musculoskeletal:     ?   General: Normal range of motion.  ?  Cervical back: Normal range of motion.  ?   Right lower leg: Edema present.  ?   Left lower leg: Edema present.  ?Skin: ?   General: Skin is warm and dry.  ?Neurological:  ?   General: No focal deficit present.  ?   Mental Status: He is alert. Mental status is at baseline.  ? ? ?ED Results / Procedures / Treatments   ?Labs ?(all labs ordered are listed, but only abnormal results are displayed) ?Labs Reviewed  ?CBC WITH DIFFERENTIAL/PLATELET - Abnormal; Notable for the following components:  ?    Result Value  ? Platelets 142 (*)   ? All other components within normal limits  ?COMPREHENSIVE METABOLIC PANEL - Abnormal; Notable for the following components:  ? Potassium  2.6 (*)   ? Glucose, Bld 128 (*)   ? Calcium 8.7 (*)   ? Albumin 3.4 (*)   ? AST 86 (*)   ? Total Bilirubin 2.3 (*)   ? All other components within normal limits  ?ETHANOL - Abnormal; Notable for the following components:  ? Alcohol, Ethyl (B) 294 (*)   ? All other components within normal limits  ?BRAIN NATRIURETIC PEPTIDE  ? ? ?EKG ?None ? ?Radiology ?DG Chest 2 View ? ?Result Date: 09/29/2021 ?CLINICAL DATA:  Dyspnea EXAM: CHEST - 2 VIEW COMPARISON:  Radiograph 09/16/2021 FINDINGS: Unchanged mildly enlarged cardiac silhouette. There is central pulmonary vascular congestion. Bibasilar subsegmental atelectasis. No large pleural effusion. No visible pneumothorax. There is no acute osseous abnormality. IMPRESSION: Central pulmonary vascular congestion with unchanged borderline cardiomegaly. Electronically Signed   By: Caprice Renshaw M.D.   On: 09/29/2021 21:22   ? ?Procedures ?Procedures  ? ? ?Medications Ordered in ED ?Medications  ?potassium chloride SA (KLOR-CON M) CR tablet 40 mEq (has no administration in time range)  ?potassium chloride 10 mEq in 100 mL IVPB (has no administration in time range)  ? ? ?ED Course/ Medical Decision Making/ A&P ?  ?                        ?Medical Decision Making ?Amount and/or Complexity of Data Reviewed ?Labs: ordered. ?Radiology: ordered. ? ?Risk ?Prescription drug management. ? ? ?36 year old male presents for evaluation of bilateral peripheral edema of at least 4-week duration.  Patient has multiple visits previously for similar complaint.  He endorses occasional exertional dyspnea.  Occasionally takes Lasix.  No formal diagnosis of CHF.  Denies chest pain, palpitations, lightheadedness.  Will obtain blood work and chest x-ray.  Patient is a heavy drinker.  Currently appears to be intoxicated during exam.  CBC without leukocytosis or anemia.  Mild thrombocytopenia at 142.  CMP with potassium of 2.6, glucose of 128 with mildly elevated liver enzymes including T. bili of 2.3.   Ethanol level of 294.  BNP pending.  Chest x-ray without acute findings or pulmonary edema.  Will replete potassium with 40 p.o. and 20 mEq via IV.  EKG without acute hyperkalemic changes. ?BNP normal.  Unlikely to be an acute heart failure exacerbation.  Patient's edema most likely secondary to hypoalbuminemia.  Discussed with patient need to cut down on alcohol consumption.  That is at bedside and is in agreement.  Request resources to help with alcohol cessation.  We will provide rehab resources.  We will also provide p.o. potassium supplement on discharge for several doses.  Do not feel patient will benefit from Lasix given his heavy alcohol consumption.  This can likely result  in overdiuresis.  He is stable for discharge.  Discharged in stable condition.  Return precautions discussed.  Patient and dad voiced understanding and are in agreement with plan. ? ? ?Final Clinical Impression(s) / ED Diagnoses ?Final diagnoses:  ?Peripheral edema  ? ? ?Rx / DC Orders ?ED Discharge Orders   ? ?      Ordered  ?  potassium chloride SA (KLOR-CON M) 20 MEQ tablet  Daily       ? 09/29/21 2314  ? ?  ?  ? ?  ? ? ?  ?Marita [place], PA-C ?09/29/21 2315 ? ?  ?Wynetta Fines, MD ?09/30/21 2323 ? ?

## 2021-09-29 NOTE — Discharge Instructions (Addendum)
Your work-up today showed that you are not in heart failure exacerbation.  Your swelling is likely secondary to low protein levels in your blood which made worse by your heavy alcohol consumption.  I have provided you with rehab resources above.  I have also sent in potassium supplement to the pharmacy for you.  I have attached [facility] information above for you to establish care with and follow-up.  They can recheck your potassium level to in a couple weeks to ensure it has remained normal. ?

## 2021-09-29 NOTE — ED Notes (Signed)
Save blue tube in main lab °

## 2021-09-29 NOTE — ED Provider Triage Note (Signed)
Emergency Medicine Provider Triage Evaluation Note ? ?[name] , a 36 y.o. male  was evaluated in triage.  Pt complains of shortness of breath with exertion, peripheral edema of about 4-week duration.  He states he occasionally takes Lasix.  He also appears to be intoxicated.  States he drinks heavily.  He presents with his dad. ? ?Review of Systems  ?Positive: As above ?Negative: Fever, productive cough, chest pain, palpitations, lightheadedness ? ?Physical Exam  ?BP (!) 142/100 (BP Location: Left Arm)   Pulse (!) 110   Temp 98.2 ?F (36.8 ?C) (Oral)   Resp 18   Ht 6\' 1"  (1.854 m)   Wt 133 kg   SpO2 100%   BMI 38.68 kg/m?  ?Gen:   Awake, no distress   ?Resp:  Normal effort  ?MSK:   Moves extremities without difficulty  ?Other:  Pitting edema to bilateral lower extremities ? ?Medical Decision Making  ?Medically screening exam initiated at 9:02 PM.  Appropriate orders placed.  Tabari Volkert was informed that the remainder of the evaluation will be completed by another provider, this initial triage assessment does not replace that evaluation, and the importance of remaining in the ED until their evaluation is complete. ? ? ?  ?[name], PA-C ?09/29/21 2103 ? ?

## 2021-11-22 ENCOUNTER — Other Ambulatory Visit: Payer: Self-pay

## 2021-11-22 ENCOUNTER — Encounter (HOSPITAL_COMMUNITY): Payer: Self-pay | Admitting: Emergency Medicine

## 2021-11-22 ENCOUNTER — Inpatient Hospital Stay (HOSPITAL_COMMUNITY)
Admission: EM | Admit: 2021-11-22 | Discharge: 2021-12-06 | DRG: 896 | Disposition: A | Discharge status: Home / Self Care | Payer: Commercial Managed Care - HMO | Attending: Internal Medicine | Admitting: Internal Medicine

## 2021-11-22 ENCOUNTER — Emergency Department (HOSPITAL_COMMUNITY): Payer: Commercial Managed Care - HMO

## 2021-11-22 DIAGNOSIS — F32A Depression, unspecified: Secondary | ICD-10-CM | POA: Diagnosis present

## 2021-11-22 DIAGNOSIS — D7589 Other specified diseases of blood and blood-forming organs: Secondary | ICD-10-CM | POA: Diagnosis present

## 2021-11-22 DIAGNOSIS — R451 Restlessness and agitation: Secondary | ICD-10-CM | POA: Diagnosis present

## 2021-11-22 DIAGNOSIS — T380X5A Adverse effect of glucocorticoids and synthetic analogues, initial encounter: Secondary | ICD-10-CM | POA: Diagnosis not present

## 2021-11-22 DIAGNOSIS — T510X1A Toxic effect of ethanol, accidental (unintentional), initial encounter: Secondary | ICD-10-CM | POA: Diagnosis present

## 2021-11-22 DIAGNOSIS — Y95 Nosocomial condition: Secondary | ICD-10-CM | POA: Diagnosis not present

## 2021-11-22 DIAGNOSIS — F10231 Alcohol dependence with withdrawal delirium: Secondary | ICD-10-CM | POA: Diagnosis present

## 2021-11-22 DIAGNOSIS — I5021 Acute systolic (congestive) heart failure: Secondary | ICD-10-CM | POA: Diagnosis present

## 2021-11-22 DIAGNOSIS — R112 Nausea with vomiting, unspecified: Secondary | ICD-10-CM | POA: Diagnosis present

## 2021-11-22 DIAGNOSIS — Z885 Allergy status to narcotic agent status: Secondary | ICD-10-CM

## 2021-11-22 DIAGNOSIS — F10239 Alcohol dependence with withdrawal, unspecified: Secondary | ICD-10-CM | POA: Diagnosis present

## 2021-11-22 DIAGNOSIS — K828 Other specified diseases of gallbladder: Secondary | ICD-10-CM | POA: Diagnosis present

## 2021-11-22 DIAGNOSIS — E7220 Disorder of urea cycle metabolism, unspecified: Secondary | ICD-10-CM | POA: Diagnosis present

## 2021-11-22 DIAGNOSIS — G8929 Other chronic pain: Secondary | ICD-10-CM | POA: Diagnosis present

## 2021-11-22 DIAGNOSIS — K7010 Alcoholic hepatitis without ascites: Secondary | ICD-10-CM | POA: Diagnosis not present

## 2021-11-22 DIAGNOSIS — J189 Pneumonia, unspecified organism: Secondary | ICD-10-CM | POA: Diagnosis not present

## 2021-11-22 DIAGNOSIS — J9601 Acute respiratory failure with hypoxia: Secondary | ICD-10-CM | POA: Diagnosis present

## 2021-11-22 DIAGNOSIS — R7401 Elevation of levels of liver transaminase levels: Secondary | ICD-10-CM | POA: Diagnosis present

## 2021-11-22 DIAGNOSIS — E669 Obesity, unspecified: Secondary | ICD-10-CM | POA: Diagnosis present

## 2021-11-22 DIAGNOSIS — Z8619 Personal history of other infectious and parasitic diseases: Secondary | ICD-10-CM | POA: Diagnosis not present

## 2021-11-22 DIAGNOSIS — G928 Other toxic encephalopathy: Secondary | ICD-10-CM | POA: Diagnosis present

## 2021-11-22 DIAGNOSIS — F419 Anxiety disorder, unspecified: Secondary | ICD-10-CM | POA: Diagnosis present

## 2021-11-22 DIAGNOSIS — J9811 Atelectasis: Secondary | ICD-10-CM | POA: Diagnosis present

## 2021-11-22 DIAGNOSIS — G312 Degeneration of nervous system due to alcohol: Secondary | ICD-10-CM | POA: Diagnosis present

## 2021-11-22 DIAGNOSIS — D72829 Elevated white blood cell count, unspecified: Secondary | ICD-10-CM | POA: Diagnosis not present

## 2021-11-22 DIAGNOSIS — F10930 Alcohol use, unspecified with withdrawal, uncomplicated: Secondary | ICD-10-CM | POA: Diagnosis not present

## 2021-11-22 DIAGNOSIS — Z720 Tobacco use: Secondary | ICD-10-CM | POA: Diagnosis present

## 2021-11-22 DIAGNOSIS — Z881 Allergy status to other antibiotic agents status: Secondary | ICD-10-CM

## 2021-11-22 DIAGNOSIS — K760 Fatty (change of) liver, not elsewhere classified: Secondary | ICD-10-CM | POA: Diagnosis present

## 2021-11-22 DIAGNOSIS — D638 Anemia in other chronic diseases classified elsewhere: Secondary | ICD-10-CM | POA: Diagnosis present

## 2021-11-22 DIAGNOSIS — Z79899 Other long term (current) drug therapy: Secondary | ICD-10-CM

## 2021-11-22 DIAGNOSIS — Z781 Physical restraint status: Secondary | ICD-10-CM

## 2021-11-22 DIAGNOSIS — E876 Hypokalemia: Secondary | ICD-10-CM | POA: Diagnosis present

## 2021-11-22 DIAGNOSIS — R6510 Systemic inflammatory response syndrome (SIRS) of non-infectious origin without acute organ dysfunction: Secondary | ICD-10-CM | POA: Diagnosis not present

## 2021-11-22 DIAGNOSIS — F1090 Alcohol use, unspecified, uncomplicated: Principal | ICD-10-CM

## 2021-11-22 DIAGNOSIS — R17 Unspecified jaundice: Secondary | ICD-10-CM

## 2021-11-22 DIAGNOSIS — I428 Other cardiomyopathies: Secondary | ICD-10-CM | POA: Diagnosis present

## 2021-11-22 DIAGNOSIS — D6489 Other specified anemias: Secondary | ICD-10-CM | POA: Diagnosis present

## 2021-11-22 DIAGNOSIS — R0603 Acute respiratory distress: Secondary | ICD-10-CM | POA: Diagnosis not present

## 2021-11-22 DIAGNOSIS — G9340 Encephalopathy, unspecified: Secondary | ICD-10-CM | POA: Diagnosis not present

## 2021-11-22 DIAGNOSIS — Z6837 Body mass index (BMI) 37.0-37.9, adult: Secondary | ICD-10-CM

## 2021-11-22 LAB — URINALYSIS, ROUTINE W REFLEX MICROSCOPIC
Bacteria, UA: NONE SEEN
Glucose, UA: NEGATIVE mg/dL
Ketones, ur: NEGATIVE mg/dL
Leukocytes,Ua: NEGATIVE
Nitrite: NEGATIVE
Protein, ur: 30 mg/dL — AB
Specific Gravity, Urine: >1.046 — ABNORMAL HIGH (ref 1.005–1.030)
pH: 6 (ref 5.0–8.0)

## 2021-11-22 LAB — CBC WITH DIFFERENTIAL/PLATELET
Abs Immature Granulocytes: 0.04 10*3/uL (ref 0.00–0.07)
Basophils Absolute: 0.1 10*3/uL (ref 0.0–0.1)
Basophils Relative: 1 %
Eosinophils Absolute: 0.1 10*3/uL (ref 0.0–0.5)
Eosinophils Relative: 1 %
HCT: 40.7 % (ref 39.0–52.0)
Hemoglobin: 14.6 g/dL (ref 13.0–17.0)
Immature Granulocytes: 0 %
Lymphocytes Relative: 21 %
Lymphs Abs: 2 10*3/uL (ref 0.7–4.0)
MCH: 34.2 pg — ABNORMAL HIGH (ref 26.0–34.0)
MCHC: 35.9 g/dL (ref 30.0–36.0)
MCV: 95.3 fL (ref 80.0–100.0)
Monocytes Absolute: 1.3 10*3/uL — ABNORMAL HIGH (ref 0.1–1.0)
Monocytes Relative: 14 %
Neutro Abs: 6.1 10*3/uL (ref 1.7–7.7)
Neutrophils Relative %: 63 %
Platelets: 139 10*3/uL — ABNORMAL LOW (ref 150–400)
RBC: 4.27 MIL/uL (ref 4.22–5.81)
RDW: 19.4 % — ABNORMAL HIGH (ref 11.5–15.5)
WBC: 9.5 10*3/uL (ref 4.0–10.5)
nRBC: 0 % (ref 0.0–0.2)

## 2021-11-22 LAB — COMPREHENSIVE METABOLIC PANEL
ALT: 70 U/L — ABNORMAL HIGH (ref 0–44)
AST: 325 U/L — ABNORMAL HIGH (ref 15–41)
Albumin: 2.9 g/dL — ABNORMAL LOW (ref 3.5–5.0)
Alkaline Phosphatase: 187 U/L — ABNORMAL HIGH (ref 38–126)
Anion gap: 12 (ref 5–15)
BUN: 9 mg/dL (ref 6–20)
CO2: 29 mmol/L (ref 22–32)
Calcium: 8.7 mg/dL — ABNORMAL LOW (ref 8.9–10.3)
Chloride: 93 mmol/L — ABNORMAL LOW (ref 98–111)
Creatinine, Ser: 0.7 mg/dL (ref 0.61–1.24)
GFR, Estimated: >60 mL/min (ref >60)
Glucose, Bld: 108 mg/dL — ABNORMAL HIGH (ref 70–99)
Potassium: 3.3 mmol/L — ABNORMAL LOW (ref 3.5–5.1)
Sodium: 134 mmol/L — ABNORMAL LOW (ref 135–145)
Total Bilirubin: 19.4 mg/dL (ref 0.3–1.2)
Total Protein: 7.7 g/dL (ref 6.5–8.1)

## 2021-11-22 LAB — RAPID URINE DRUG SCREEN, HOSP PERFORMED
Amphetamines: NOT DETECTED
Barbiturates: NOT DETECTED
Benzodiazepines: POSITIVE — AB
Cocaine: NOT DETECTED
Opiates: NOT DETECTED
Tetrahydrocannabinol: NOT DETECTED

## 2021-11-22 LAB — MAGNESIUM: Magnesium: 1.4 mg/dL — ABNORMAL LOW (ref 1.7–2.4)

## 2021-11-22 LAB — PROTIME-INR
INR: 1.5 — ABNORMAL HIGH (ref 0.8–1.2)
Prothrombin Time: 18.1 seconds — ABNORMAL HIGH (ref 11.4–15.2)

## 2021-11-22 LAB — CK: Total CK: 189 U/L (ref 49–397)

## 2021-11-22 LAB — APTT: aPTT: 41 seconds — ABNORMAL HIGH (ref 24–36)

## 2021-11-22 LAB — ETHANOL: Alcohol, Ethyl (B): 29 mg/dL — ABNORMAL HIGH (ref <10)

## 2021-11-22 MED ORDER — THIAMINE HCL 100 MG/ML IJ SOLN
100.0000 mg | Freq: Every day | INTRAMUSCULAR | Status: DC
  Administered 2021-11-28 – 2021-12-02 (×5): 100 mg via INTRAVENOUS
  Filled 2021-11-22 (×5): qty 2

## 2021-11-22 MED ORDER — PREDNISONE 20 MG PO TABS
40.0000 mg | ORAL_TABLET | Freq: Every day | ORAL | Status: DC
Start: 2021-11-22 — End: 2021-11-23

## 2021-11-22 MED ORDER — MAGNESIUM SULFATE 2 GM/50ML IV SOLN
2.0000 g | Freq: Once | INTRAVENOUS | Status: AC
  Administered 2021-11-22: 2 g via INTRAVENOUS
  Filled 2021-11-22: qty 50

## 2021-11-22 MED ORDER — ONDANSETRON HCL 4 MG/2ML IJ SOLN
4.0000 mg | Freq: Four times a day (QID) | INTRAMUSCULAR | Status: DC | PRN
Start: 2021-11-22 — End: 2021-12-06
  Administered 2021-11-24 – 2021-11-27 (×3): 4 mg via INTRAVENOUS
  Filled 2021-11-22 (×3): qty 2

## 2021-11-22 MED ORDER — ORAL CARE MOUTH RINSE
15.0000 mL | Freq: Two times a day (BID) | OROMUCOSAL | Status: DC
  Administered 2021-11-23 – 2021-12-04 (×16): 15 mL via OROMUCOSAL

## 2021-11-22 MED ORDER — THIAMINE HCL 100 MG PO TABS
100.0000 mg | ORAL_TABLET | Freq: Every day | ORAL | Status: DC
  Administered 2021-11-22 – 2021-12-05 (×9): 100 mg via ORAL
  Filled 2021-11-22 (×9): qty 1

## 2021-11-22 MED ORDER — LOPERAMIDE HCL 2 MG PO CAPS: 2.0000 mg | ORAL_CAPSULE | ORAL | Status: AC | PRN

## 2021-11-22 MED ORDER — LORAZEPAM 1 MG PO TABS
1.0000 mg | ORAL_TABLET | ORAL | Status: AC | PRN
  Administered 2021-11-22: 2 mg via ORAL
  Filled 2021-11-22: qty 2
  Filled 2021-11-22: qty 1
  Filled 2021-11-22: qty 2

## 2021-11-22 MED ORDER — CHLORHEXIDINE GLUCONATE CLOTH 2 % EX PADS
6.0000 | MEDICATED_PAD | Freq: Every day | CUTANEOUS | Status: DC
  Administered 2021-11-22 – 2021-12-05 (×14): 6 via TOPICAL

## 2021-11-22 MED ORDER — LACTATED RINGERS IV SOLN: INTRAVENOUS | Status: AC

## 2021-11-22 MED ORDER — ONDANSETRON HCL 4 MG PO TABS: 4.0000 mg | ORAL_TABLET | Freq: Four times a day (QID) | ORAL | Status: DC | PRN

## 2021-11-22 MED ORDER — CHLORDIAZEPOXIDE HCL 25 MG PO CAPS
25.0000 mg | ORAL_CAPSULE | Freq: Three times a day (TID) | ORAL | Status: AC
  Administered 2021-11-24 – 2021-11-25 (×3): 25 mg via ORAL
  Filled 2021-11-22 (×3): qty 1

## 2021-11-22 MED ORDER — LORAZEPAM 2 MG/ML IJ SOLN
1.0000 mg | INTRAMUSCULAR | Status: AC | PRN
  Administered 2021-11-22: 2 mg via INTRAVENOUS
  Administered 2021-11-23 (×3): 4 mg via INTRAVENOUS
  Administered 2021-11-23 (×2): 1 mg via INTRAVENOUS
  Administered 2021-11-24 (×2): 4 mg via INTRAVENOUS
  Administered 2021-11-25: 1 mg via INTRAVENOUS
  Administered 2021-11-25: 2 mg via INTRAVENOUS
  Filled 2021-11-22: qty 1
  Filled 2021-11-22: qty 2
  Filled 2021-11-22: qty 1
  Filled 2021-11-22: qty 2
  Filled 2021-11-22: qty 1
  Filled 2021-11-22: qty 2
  Filled 2021-11-22: qty 1
  Filled 2021-11-22: qty 2
  Filled 2021-11-22 (×3): qty 1
  Filled 2021-11-22: qty 2

## 2021-11-22 MED ORDER — ADULT MULTIVITAMIN W/MINERALS CH
1.0000 | ORAL_TABLET | Freq: Every day | ORAL | Status: DC
  Administered 2021-11-22 – 2021-12-05 (×11): 1 via ORAL
  Filled 2021-11-22 (×11): qty 1

## 2021-11-22 MED ORDER — LORAZEPAM 2 MG/ML IJ SOLN
1.0000 mg | Freq: Once | INTRAMUSCULAR | Status: AC
Start: 2021-11-22 — End: 2021-11-22
  Administered 2021-11-22: 1 mg via INTRAVENOUS

## 2021-11-22 MED ORDER — NICOTINE 21 MG/24HR TD PT24
21.0000 mg | MEDICATED_PATCH | Freq: Every day | TRANSDERMAL | Status: DC
  Administered 2021-11-23 – 2021-12-05 (×14): 21 mg via TRANSDERMAL
  Filled 2021-11-22 (×15): qty 1

## 2021-11-22 MED ORDER — ENOXAPARIN SODIUM 40 MG/0.4ML IJ SOSY
40.0000 mg | PREFILLED_SYRINGE | INTRAMUSCULAR | Status: DC
  Administered 2021-11-23 – 2021-12-05 (×13): 40 mg via SUBCUTANEOUS
  Filled 2021-11-22 (×13): qty 0.4

## 2021-11-22 MED ORDER — CHLORDIAZEPOXIDE HCL 25 MG PO CAPS
25.0000 mg | ORAL_CAPSULE | Freq: Four times a day (QID) | ORAL | Status: AC
  Administered 2021-11-22 – 2021-11-24 (×4): 25 mg via ORAL
  Filled 2021-11-22 (×5): qty 1

## 2021-11-22 MED ORDER — POTASSIUM CHLORIDE 20 MEQ PO PACK: 40.0000 meq | PACK | Freq: Once | ORAL | Status: DC

## 2021-11-22 MED ORDER — SODIUM CHLORIDE 0.9% FLUSH
3.0000 mL | Freq: Two times a day (BID) | INTRAVENOUS | Status: DC
  Administered 2021-11-22 – 2021-12-05 (×24): 3 mL via INTRAVENOUS

## 2021-11-22 MED ORDER — LACTATED RINGERS IV BOLUS
1000.0000 mL | Freq: Once | INTRAVENOUS | Status: AC
  Administered 2021-11-22: 1000 mL via INTRAVENOUS

## 2021-11-22 MED ORDER — IOHEXOL 300 MG/ML  SOLN
100.0000 mL | Freq: Once | INTRAMUSCULAR | Status: AC | PRN
  Administered 2021-11-22: 100 mL via INTRAVENOUS

## 2021-11-22 MED ORDER — FOLIC ACID 1 MG PO TABS
1.0000 mg | ORAL_TABLET | Freq: Every day | ORAL | Status: DC
  Administered 2021-11-22 – 2021-11-27 (×6): 1 mg via ORAL
  Filled 2021-11-22 (×6): qty 1

## 2021-11-22 MED ORDER — CHLORDIAZEPOXIDE HCL 5 MG PO CAPS
25.0000 mg | ORAL_CAPSULE | Freq: Every day | ORAL | Status: AC
  Administered 2021-11-26: 25 mg via ORAL
  Filled 2021-11-22: qty 5

## 2021-11-22 MED ORDER — CHLORDIAZEPOXIDE HCL 25 MG PO CAPS
25.0000 mg | ORAL_CAPSULE | ORAL | Status: AC
  Administered 2021-11-25 – 2021-11-26 (×2): 25 mg via ORAL
  Filled 2021-11-22 (×2): qty 1

## 2021-11-22 NOTE — ED Provider Notes (Incomplete)
[facility]-EMERGENCY DEPT Provider Note   CSN: [id number] Arrival date & time: 11/22/21  1400     History {Add pertinent medical, surgical, social history, OB history to HPI:1} Chief Complaint  Patient presents with   Withdrawal    Larry Adams is a 36 y.o. male.  HPI     Home Medications Prior to Admission medications   Medication Sig Start Date End Date Taking? Authorizing Provider  chlordiazePOXIDE (LIBRIUM) 25 MG capsule Take 2 capsules (50mg ) by mouth three times daily for 1 day, then take 1-2 caps (25-50mg ) twice a day for 1 day, then 1-2 caps (25-50mg ) daily for 1 day. 09/12/21   09/14/21, MD  furosemide (LASIX) 20 MG tablet Take 1 tablet (20 mg total) by mouth daily. 08/31/21   10/31/21, MD  Multiple Vitamin (MULTIVITAMIN WITH MINERALS) TABS tablet Take 1 tablet by mouth daily. 04/02/21   06/02/21, MD  potassium chloride SA (KLOR-CON M) 20 MEQ tablet Take 2 tablets (40 mEq total) by mouth daily for 3 days. 09/29/21 10/02/21  12/02/21, PA-C  sertraline (ZOLOFT) 50 MG tablet Take 1 tablet (50 mg total) by mouth daily. 04/01/21   06/01/21, MD      Allergies    Morphine and related and Vancomycin    Review of Systems   Review of Systems  Physical Exam Updated Vital Signs BP (!) 156/106   Pulse (!) 105   Temp 98.4 F (36.9 C) (Oral)   Resp (!) 30   Ht 6\' 1"  (1.854 m)   Wt 127 kg   SpO2 94%   BMI 36.94 kg/m  Physical Exam  ED Results / Procedures / Treatments   Labs (all labs ordered are listed, but only abnormal results are displayed) Labs Reviewed  ETHANOL - Abnormal; Notable for the following components:      Result Value   Alcohol, Ethyl (B) 29 (*)    All other components within normal limits  URINALYSIS, ROUTINE W REFLEX MICROSCOPIC - Abnormal; Notable for the following components:   Color, Urine AMBER (*)    Specific Gravity, Urine >1.046 (*)    Hgb urine dipstick SMALL (*)    Bilirubin Urine  MODERATE (*)    Protein, ur 30 (*)    Non Squamous Epithelial 0-5 (*)    All other components within normal limits  RAPID URINE DRUG SCREEN, HOSP PERFORMED - Abnormal; Notable for the following components:   Benzodiazepines POSITIVE (*)    All other components within normal limits  MAGNESIUM - Abnormal; Notable for the following components:   Magnesium 1.4 (*)    All other components within normal limits  CBC WITH DIFFERENTIAL/PLATELET - Abnormal; Notable for the following components:   MCH 34.2 (*)    RDW 19.4 (*)    Platelets 139 (*)    Monocytes Absolute 1.3 (*)    All other components within normal limits  COMPREHENSIVE METABOLIC PANEL - Abnormal; Notable for the following components:   Sodium 134 (*)    Potassium 3.3 (*)    Chloride 93 (*)    Glucose, Bld 108 (*)    Calcium 8.7 (*)    Albumin 2.9 (*)    AST 325 (*)    ALT 70 (*)    Alkaline Phosphatase 187 (*)    Total Bilirubin 19.4 (*)    All other components within normal limits  CK  APTT  PROTIME-INR    EKG EKG Interpretation  Date/Time:  Saturday  Nov 22 2021 15:00:23 EDT Ventricular Rate:  104 PR Interval:  134 QRS Duration: 99 QT Interval:  356 QTC Calculation: 469 R Axis:   41 Text Interpretation: Sinus tachycardia Minimal ST depression, lateral leads when compared to prior, faster rate. No sTEMI Confirmed by Theda Belfast ([id number]) on 11/22/2021 3:24:02 PM  Radiology CT ABDOMEN PELVIS W CONTRAST  Result Date: 11/22/2021 CLINICAL DATA:  Nausea and vomiting, hyperbilirubinemia EXAM: CT ABDOMEN AND PELVIS WITH CONTRAST TECHNIQUE: Multidetector CT imaging of the abdomen and pelvis was performed using the standard protocol following bolus administration of intravenous contrast. RADIATION DOSE REDUCTION: This exam was performed according to the departmental dose-optimization program which includes automated exposure control, adjustment of the mA and/or kV according to patient size and/or use of iterative  reconstruction technique. CONTRAST:  OMNIPAQUE IOHEXOL 300 MG/ML  SOLN COMPARISON:  12/18/2012 FINDINGS: Lower chest: No acute pleural or parenchymal lung disease. Hepatobiliary: Severe hepatic steatosis again noted. Likely area of focal fatty sparing within the inferior right lobe. No intrahepatic biliary duct dilation. Moderate gallbladder distension without evidence of cholelithiasis or cholecystitis. High attenuation material layering dependently in the gallbladder consistent with sludge. No biliary duct dilation. Pancreas: Unremarkable. No pancreatic ductal dilatation or surrounding inflammatory changes. Spleen: Normal in size without focal abnormality. Adrenals/Urinary Tract: Adrenal glands are unremarkable. Kidneys are normal, without renal calculi, focal lesion, or hydronephrosis. Bladder is unremarkable. Stomach/Bowel: No bowel obstruction or ileus. Normal appendix right lower quadrant. No bowel wall thickening or inflammatory change. Vascular/Lymphatic: Stable communication between the portal vein and IVC and hepatic veins, consistent with portacaval shunt. No other significant vascular findings. No pathologic adenopathy. Reproductive: Prostate is unremarkable. Other: No free fluid or free intraperitoneal gas. No abdominal wall hernia. Musculoskeletal: Subacute to chronic healing left posterior ninth, tenth, and eleventh rib fractures. No other acute bony abnormalities. Reconstructed images demonstrate no additional findings. IMPRESSION: 1. Gallbladder sludge without evidence of cholelithiasis or cholecystitis. 2. Stable severe hepatic steatosis. 3. Stable spontaneous portacaval shunt. Electronically Signed   By: Sharlet Salina M.D.   On: 11/22/2021 16:35    Procedures Procedures  {Document cardiac monitor, telemetry assessment procedure when appropriate:1}  Medications Ordered in ED Medications  LORazepam (ATIVAN) tablet 1-4 mg (2 mg Oral Given 11/22/21 1439)    Or  LORazepam (ATIVAN)  injection 1-4 mg ( Intravenous See Alternative 11/22/21 1439)  thiamine tablet 100 mg (100 mg Oral Given 11/22/21 1433)    Or  thiamine (B-1) injection 100 mg ( Intravenous See Alternative 11/22/21 1433)  folic acid (FOLVITE) tablet 1 mg (1 mg Oral Given 11/22/21 1433)  multivitamin with minerals tablet 1 tablet (1 tablet Oral Given 11/22/21 1433)  lactated ringers bolus 1,000 mL (1,000 mLs Intravenous New Bag/Given 11/22/21 1501)  magnesium sulfate IVPB 2 g 50 mL (2 g Intravenous New Bag/Given 11/22/21 1620)  LORazepam (ATIVAN) injection 1 mg (1 mg Intravenous Given 11/22/21 1555)  iohexol (OMNIPAQUE) 300 MG/ML solution 100 mL (100 mLs Intravenous Contrast Given 11/22/21 1606)    ED Course/ Medical Decision Making/ A&P                           Medical Decision Making Amount and/or Complexity of Data Reviewed Labs: ordered. Radiology: ordered.  Risk OTC drugs. Prescription drug management.   ***  {Document critical care time when appropriate:1} {Document review of labs and clinical decision tools ie heart score, Chads2Vasc2 etc:1}  {Document your independent review of radiology images, and any  outside records:1} {Document your discussion with family members, caretakers, and with consultants:1} {Document social determinants of health affecting pt's care:1} {Document your decision making why or why not admission, treatments were needed:1} Final Clinical Impression(s) / ED Diagnoses Final diagnoses:  None    Rx / DC Orders ED Discharge Orders     None

## 2021-11-22 NOTE — ED Triage Notes (Signed)
Reports daily ETOH intake, had went 24 hours w/o drinking, had 8% 16 oz beer today at noon, reports cramping and feeling dehydrated. Reports 9 episodes of emesis that was mostly bile. Feels like he is in ETOH withdrawal. Normally drinks 5 12% 16 oz malt liquors per day, but ran out yesterday.

## 2021-11-22 NOTE — Progress Notes (Signed)
Patient arrived to unit at 2230. Transported by ED nurse. Patient had clothing and cellphone with charger noted on person. No new orders at this time. Will continue to monitor.

## 2021-11-22 NOTE — ED Provider Notes (Signed)
[place] DEPT Provider Note   CSN: [id number] Arrival date & time: 11/22/21  1400     History Chief Complaint  Patient presents with   Withdrawal    Larry Adams is a 36 y.o. male with h/p psoraisis, alcohol use disorder presents to the ED for evaluation of nausea, vomiting, and leg and hand cramps sing yesterday. The patient reports that he drinks around 5 malt liquor drinks a day. He did have one beer around noon today, however, yesterday his last drink was in the morning. Last night, he had around 7 episodes of nonbloody and nonbilious emesis.  He had 2 additional episodes of emesis today.  He was able to finish.  Without any back or lower.  He denies any abdominal pain, chest pain, shortness of breath, diarrhea, constipation, dysuria, hematuria.  He states that he is ready to stop drinking alcohol.  He has a prior history of IV drug use of heroin although reports his last use was years ago.  Half pack per day daily tobacco user.  He reports that he may have possibly had a seizure 6 months ago as he was drinking alcohol and blacked out.  He was told by the hospital staff that he did have a seizure.  He denies any seizure-like activity or any seizure since then.  HPI     Home Medications Prior to Admission medications   Medication Sig Start Date End Date Taking? Authorizing Provider  chlordiazePOXIDE (LIBRIUM) 25 MG capsule Take 2 capsules (22m) by mouth three times daily for 1 day, then take 1-2 caps (25-564m twice a day for 1 day, then 1-2 caps (25-5061mdaily for 1 day. 09/12/21   NanVarney BilesD  furosemide (LASIX) 20 MG tablet Take 1 tablet (20 mg total) by mouth daily. 08/31/21   ZacFredia SorrowD  Multiple Vitamin (MULTIVITAMIN WITH MINERALS) TABS tablet Take 1 tablet by mouth daily. 04/02/21   GheCaren GriffinsD  potassium chloride SA (KLOR-CON M) 20 MEQ tablet Take 2 tablets (40 mEq total) by mouth daily for 3 days. 09/29/21 10/02/21  AliEvlyn CourierA-C  sertraline (ZOLOFT) 50 MG tablet Take 1 tablet (50 mg total) by mouth daily. 04/01/21   GheCaren GriffinsD      Allergies    Morphine and related and Vancomycin    Review of Systems   Review of Systems  Constitutional:  Negative for chills and fever.  Respiratory:  Negative for cough and shortness of breath.   Cardiovascular:  Negative for chest pain.  Gastrointestinal:  Positive for nausea and vomiting. Negative for abdominal pain, constipation and diarrhea.  Genitourinary:  Negative for dysuria and hematuria.  Musculoskeletal:  Positive for myalgias.  Neurological:  Positive for tremors. Negative for seizures and headaches.   Physical Exam Updated Vital Signs BP (!) 145/98   Pulse (!) 115   Temp 98.4 F (36.9 C) (Oral)   Resp (!) 32   Ht '6\' 1"'  (1.854 m)   Wt 127 kg   SpO2 98%   BMI 36.94 kg/m  Physical Exam Vitals and nursing note reviewed.  Constitutional:      General: He is not in acute distress.    Appearance: He is not ill-appearing or toxic-appearing.  HENT:     Mouth/Throat:     Mouth: Mucous membranes are moist.     Pharynx: No oropharyngeal exudate or posterior oropharyngeal erythema.     Comments: Sublingual icterus.  Moist mucous membranes.  Poor dentition.  Eyes:     General: Scleral icterus present.  Cardiovascular:     Rate and Rhythm: Tachycardia present.  Pulmonary:     Effort: Pulmonary effort is normal. No respiratory distress.     Breath sounds: Normal breath sounds.  Abdominal:     General: Bowel sounds are normal.     Palpations: Abdomen is soft.     Tenderness: There is no abdominal tenderness. There is no guarding or rebound.     Comments: Psoriatic lesions on the patient's abdomen otherwise normal.  Musculoskeletal:     Cervical back: Normal range of motion.     Right lower leg: Edema present.     Left lower leg: Edema present.  Skin:    General: Skin is warm and dry.     Comments: Psoriatic lesion seen on the patient's  hands and abdomen.  Neurological:     General: No focal deficit present.     Mental Status: He is alert and oriented to person, place, and time. Mental status is at baseline.     Cranial Nerves: No cranial nerve deficit.     Motor: No weakness.    ED Results / Procedures / Treatments   Labs (all labs ordered are listed, but only abnormal results are displayed) Labs Reviewed  ETHANOL - Abnormal; Notable for the following components:      Result Value   Alcohol, Ethyl (B) 29 (*)    All other components within normal limits  URINALYSIS, ROUTINE W REFLEX MICROSCOPIC - Abnormal; Notable for the following components:   Color, Urine AMBER (*)    Specific Gravity, Urine >1.046 (*)    Hgb urine dipstick SMALL (*)    Bilirubin Urine MODERATE (*)    Protein, ur 30 (*)    Non Squamous Epithelial 0-5 (*)    All other components within normal limits  RAPID URINE DRUG SCREEN, HOSP PERFORMED - Abnormal; Notable for the following components:   Benzodiazepines POSITIVE (*)    All other components within normal limits  MAGNESIUM - Abnormal; Notable for the following components:   Magnesium 1.4 (*)    All other components within normal limits  CBC WITH DIFFERENTIAL/PLATELET - Abnormal; Notable for the following components:   MCH 34.2 (*)    RDW 19.4 (*)    Platelets 139 (*)    Monocytes Absolute 1.3 (*)    All other components within normal limits  COMPREHENSIVE METABOLIC PANEL - Abnormal; Notable for the following components:   Sodium 134 (*)    Potassium 3.3 (*)    Chloride 93 (*)    Glucose, Bld 108 (*)    Calcium 8.7 (*)    Albumin 2.9 (*)    AST 325 (*)    ALT 70 (*)    Alkaline Phosphatase 187 (*)    Total Bilirubin 19.4 (*)    All other components within normal limits  APTT - Abnormal; Notable for the following components:   aPTT 41 (*)    All other components within normal limits  PROTIME-INR - Abnormal; Notable for the following components:   Prothrombin Time 18.1 (*)    INR  1.5 (*)    All other components within normal limits  CK  HEPATITIS PANEL, ACUTE    EKG EKG Interpretation  Date/Time:  Saturday Nov 22 2021 15:00:23 EDT Ventricular Rate:  104 PR Interval:  134 QRS Duration: 99 QT Interval:  356 QTC Calculation: 469 R Axis:   41 Text Interpretation: Sinus tachycardia Minimal ST  depression, lateral leads when compared to prior, faster rate. No sTEMI Confirmed by Antony Blackbird [phone]) on 11/22/2021 3:24:02 PM  Radiology CT ABDOMEN PELVIS W CONTRAST  Result Date: 11/22/2021 CLINICAL DATA:  Nausea and vomiting, hyperbilirubinemia EXAM: CT ABDOMEN AND PELVIS WITH CONTRAST TECHNIQUE: Multidetector CT imaging of the abdomen and pelvis was performed using the standard protocol following bolus administration of intravenous contrast. RADIATION DOSE REDUCTION: This exam was performed according to the departmental dose-optimization program which includes automated exposure control, adjustment of the mA and/or kV according to patient size and/or use of iterative reconstruction technique. CONTRAST:  186m OMNIPAQUE IOHEXOL 300 MG/ML  SOLN COMPARISON:  12/18/2012 FINDINGS: Lower chest: No acute pleural or parenchymal lung disease. Hepatobiliary: Severe hepatic steatosis again noted. Likely area of focal fatty sparing within the inferior right lobe. No intrahepatic biliary duct dilation. Moderate gallbladder distension without evidence of cholelithiasis or cholecystitis. High attenuation material layering dependently in the gallbladder consistent with sludge. No biliary duct dilation. Pancreas: Unremarkable. No pancreatic ductal dilatation or surrounding inflammatory changes. Spleen: Normal in size without focal abnormality. Adrenals/Urinary Tract: Adrenal glands are unremarkable. Kidneys are normal, without renal calculi, focal lesion, or hydronephrosis. Bladder is unremarkable. Stomach/Bowel: No bowel obstruction or ileus. Normal appendix right lower quadrant. No bowel wall  thickening or inflammatory change. Vascular/Lymphatic: Stable communication between the portal vein and IVC and hepatic veins, consistent with portacaval shunt. No other significant vascular findings. No pathologic adenopathy. Reproductive: Prostate is unremarkable. Other: No free fluid or free intraperitoneal gas. No abdominal wall hernia. Musculoskeletal: Subacute to chronic healing left posterior ninth, tenth, and eleventh rib fractures. No other acute bony abnormalities. Reconstructed images demonstrate no additional findings. IMPRESSION: 1. Gallbladder sludge without evidence of cholelithiasis or cholecystitis. 2. Stable severe hepatic steatosis. 3. Stable spontaneous portacaval shunt. Electronically Signed   By: MRanda NgoM.D.   On: 11/22/2021 16:35    Procedures Procedures   Medications Ordered in ED Medications  LORazepam (ATIVAN) tablet 1-4 mg ( Oral See Alternative 11/22/21 1826)    Or  LORazepam (ATIVAN) injection 1-4 mg (2 mg Intravenous Given 11/22/21 1826)  thiamine tablet 100 mg (100 mg Oral Given 11/22/21 1433)    Or  thiamine (B-1) injection 100 mg ( Intravenous See Alternative [id number]/2817681  folic acid (FOLVITE) tablet 1 mg (1 mg Oral Given 11/22/21 1433)  multivitamin with minerals tablet 1 tablet (1 tablet Oral Given 11/22/21 1433)  lactated ringers bolus 1,000 mL (0 mLs Intravenous Stopped 11/22/21 1725)  magnesium sulfate IVPB 2 g 50 mL (0 g Intravenous Stopped 11/22/21 1720)  LORazepam (ATIVAN) injection 1 mg (1 mg Intravenous Given 11/22/21 1555)  iohexol (OMNIPAQUE) 300 MG/ML solution 100 mL (100 mLs Intravenous Contrast Given 11/22/21 1606)  lactated ringers bolus 1,000 mL (1,000 mLs Intravenous New Bag/Given 11/22/21 1823)    ED Course/ Medical Decision Making/ A&P                           Medical Decision Making Amount and/or Complexity of Data Reviewed Labs: ordered. Radiology: ordered.  Risk OTC drugs. Prescription drug management. Decision regarding  hospitalization.    36year old male presents the emergency department for evaluation of nausea, vomiting, bilateral hand and feet cramps since late last night.  Differential diagnosis includes is not limited to dehydration, electrolyte abnormality, alcohol withdrawal, hepatic dysfunction, hepatitis, SBO, AKA.  Vital signs show mild elevated blood pressure 145/98, afebrile, patient tachycardic in the 1 teens to 120s, satting well  on room air without any increased work of breathing.  Physical exam is pertinent for well-appearing patient in no acute distress.  He has moist use membranes with poor dentition.  He does have sublingual icterus as well as bilateral scleral icterus.  He is tachycardic.  Lungs are clear to auscultation.  Abdomen shows some psoriatic lesions otherwise soft with normal active bowel sounds.  No guarding or rebound.  No tenderness.  He does have some swelling to his bilateral lower legs.  He is oriented x3 with no focal deficit.  Cranial nerves intact.  No weakness.  We will order basic labs.  I independently reviewed and interpreted the patient's labs.  CBC shows no leukocytosis or anemia although the patient does have some slight thrombocytopenia at 139.  Elevated monocyte count 1.3.  Hypomagnesia at 1.4.  Normal CK.  CMP shows mild hyponatremia at 134.  Mild hypokalemia at 3.3.  Mild decrease in chloride at 93.  Glucose slightly elevated 108 although not fasting.  Mild decrease in calcium at 8.7.  Hypoalbuminemia at 2.9.  He has significantly increased AST, ALT and alk phos.  His AST is almost quadrupled since his last CMP 1 month ago.  He has a significantly elevated total bili at 19.4.  Normal gap.  Ethanol at 29.  His UDS is positive for benzodiazepines although he was only able to produce a urine sample after his Ativan.  Urinalysis shows amber urine that is significantly concentrated with small amount of hemoglobin and moderate amount of bili.  He only has 30 protein although  surprisingly does not have any ketones.  Given his elevated total bili and liver enzymes, will order PT/INR and APTT as well as an abdominal CT scan.  CT abdomen pelvis with contrast shows gallbladder sludge without evidence of cholelithiasis or cholecystitis. Stable severe hepatic steatosis. Stable spontaneous portacaval shunt.   PT/INR and APTT slightly elevated.  While here, his CIWA score remained approximately 15 and he received multiple doses of Ativan as well as 2 L of LR.  CIWA protocol was placed and he was also given folic acid, multivitamin, and thiamine.  For his hypomagnesia, I did order him a 2 g IVPB.  I consulted GI and spoke to Dr. Watt Climes.  He recommended the patient will need to be on prednisone daily likely for the next month.  He sees admission as reasonable and asked that the hospitalist consult him if they see needed.  Dr. Posey Pronto with [facility] called me and would like me to consult CCM before they are admitted to stepdown unit.  Dr. Loanne Drilling with CCM who reviewed the chart and reports the patient is safe for stepdown although if the patient worsens, she is happy to consult or take the patient on.  Will admit to Dr. Posey Pronto with [facility].   I discussed admission process with patient and need for aggressive IV hydration as well as prednisone.  The patient is amenable to admission and would like to be detox and alcohol as well.  I discussed this case with my attending physician who cosigned this note including patient's presenting symptoms, physical exam, and planned diagnostics and interventions. Attending physician stated agreement with plan or made changes to plan which were implemented.   Final Clinical Impression(s) / ED Diagnoses Final diagnoses:  Alcohol use disorder  Alcohol withdrawal syndrome without complication (HCC)  Hypomagnesemia  Hypokalemia  Transaminitis  Total bilirubin, elevated    Rx / DC Orders ED Discharge Orders  None          Sherrell Puller, [place] 11/22/21 2132    Tegeler, Gwenyth Allegra, MD 11/23/21 [phone]

## 2021-11-22 NOTE — Assessment & Plan Note (Addendum)
Smoking about half pack per day.  Nicotine patch provided.

## 2021-11-22 NOTE — Progress Notes (Signed)
Patient d/c IV access during sleep. Patient requested IV ativan. Nurse placed IV consult for patient. No new orders at this time. Will continue to monitor.

## 2021-11-22 NOTE — Assessment & Plan Note (Signed)
Not on any current treatment for mood as an outpatient.

## 2021-11-22 NOTE — H&P (Signed)
History and Physical    Larry Adams:[id number] DOB: [birth year] DOA: 11/22/2021  PCP: Patient, No Pcp Per (Inactive)  Patient coming from: Home  I have personally briefly reviewed patient's old medical records in [place]  Chief Complaint: Nausea, vomiting  HPI: Larry Adams is a 36 y.o. male with medical history significant for severe alcohol use disorder with dependence, substance use disorder (heroin, benzodiazepines), hepatic steatosis, depression who presented to the ED for evaluation of nausea with frequent bilious emesis and concern for alcohol withdrawal.  Patient reports chronic heavy alcohol use on a daily basis.  He says he usually drinks five 12% 16 oz cans of malt liquor daily.  He discontinued abruptly yesterday (5/26) due to lack of money.  Last night he began to have withdrawal symptoms with frequent nausea and vomiting, diaphoresis, palpitations, tremors, anxiety.  Initial emesis was clear then became bilious.  He has had some abdominal cramping related to vomiting.  He was feeling dehydrated and when trying to drink water he would subsequently throw up.  He had a beer today around 1130-1200 to try to alleviate his symptoms without success.  He came to the ED for further management due to concern of alcohol withdrawal.  He lives with his father who is at bedside and states that he is a former alcoholic.  Patient states that he is smoking about half a pack tobacco per day.  He denies any recent IV drug use.  He denies recent recreational benzodiazepine use.  ED Course  Labs/Imaging on admission: I have personally reviewed following labs and imaging studies.  Initial vitals showed BP 146/107, pulse 113, RR 20, temp 98.4 F, SPO2 99% on room air.  Labs show total bilirubin 19.4, AST 325, ALT 70, alk phos 187, PT 18.1, INR 1.5, sodium 134, potassium 3.3, bicarb 29, BUN 9, creatinine 0.70, serum glucose 108, WBC 9.5, hemoglobin 14.6, platelets 139,000,  magnesium 1.4, CK1 189.  Serum ethanol 29, UDS positive for benzodiazepines.  Urinalysis negative for UTI.  Acute hepatitis panel ordered and pending.  CT abdomen/pelvis with contrast showed gallbladder sludge without evidence of cholelithiasis or cholecystitis.  No biliary ductal dilation.  Stable severe hepatic steatosis and stable spontaneous portacaval shunt noted.  Patient was given 2 L LR, IV Ativan 2 mg and 1 mg, IV magnesium 2 g, placed on CIWA protocol.  EDP discussed with on-call GI who recommended prednisone for alcohol hepatitis.  EDP also discussed with PCCM who felt patient was stable for stepdown unit.  The hospitalist service was consulted to admit for further evaluation and management.  Review of Systems: All systems reviewed and are negative except as documented in history of present illness above.   Past Medical History:  Diagnosis Date   Anxiety    Back pain    Depression    Drug overdose    Mental disorder     Past Surgical History:  Procedure Laterality Date   HERNIA REPAIR      Social History:  reports that he has been smoking cigarettes. He has been smoking an average of 1 pack per day. He has quit using smokeless tobacco. He reports current alcohol use of about 6.0 standard drinks per week. He reports that he does not currently use drugs after having used the following drugs: IV and Fentanyl.  Allergies  Allergen Reactions   Morphine And Related Hives   Vancomycin Itching and Other (See Comments)    "Red man syndrome"     Family History  Family history unknown: Yes     Prior to Admission medications   Medication Sig Start Date End Date Taking? Authorizing Provider  chlordiazePOXIDE (LIBRIUM) 25 MG capsule Take 2 capsules (60m) by mouth three times daily for 1 day, then take 1-2 caps (25-554m twice a day for 1 day, then 1-2 caps (25-5044mdaily for 1 day. 09/12/21   NanVarney BilesD  furosemide (LASIX) 20 MG tablet Take 1 tablet (20 mg total) by  mouth daily. 08/31/21   ZacFredia SorrowD  Multiple Vitamin (MULTIVITAMIN WITH MINERALS) TABS tablet Take 1 tablet by mouth daily. 04/02/21   GheCaren GriffinsD  potassium chloride SA (KLOR-CON M) 20 MEQ tablet Take 2 tablets (40 mEq total) by mouth daily for 3 days. 09/29/21 10/02/21  AliEvlyn CourierA-C  sertraline (ZOLOFT) 50 MG tablet Take 1 tablet (50 mg total) by mouth daily. 04/01/21   GheCaren GriffinsD    Physical Exam: Vitals:   11/22/21 1850 11/22/21 1854 11/22/21 1900 11/22/21 1915  BP:  (!) 146/99 (!) 148/103 (!) 145/98  Pulse: (!) 115 (!) 117 (!) 115 (!) 115  Resp: (!) 21 (!) 22 (!) 22 (!) 32  Temp:      TempSrc:      SpO2: 100% 97% 95% 98%  Weight:      Height:       Constitutional: Disheveled man resting in bed, NAD, calm, answering questions appropriately Eyes: EOMI, scleral icterus ENMT: Mucous membranes are dry. Posterior pharynx clear of any exudate or lesions. Neck: normal, supple, no masses. Respiratory: clear to auscultation bilaterally, no wheezing, no crackles. Normal respiratory effort. No accessory muscle use.  Cardiovascular: Tachycardic, no murmurs / rubs / gallops. No extremity edema. 2+ pedal pulses. Abdomen: no tenderness, no masses palpated.  Musculoskeletal: no clubbing / cyanosis. No joint deformity upper and lower extremities. Good ROM, no contractures. Normal muscle tone.  Skin: Psoriatic appearing rash extensor surface of the elbows. No induration Neurologic: Sensation intact. Strength 5/5 in all 4.  Psychiatric: Alert and oriented x 3. Normal mood.   EKG: Personally reviewed. Sinus tachycardia, rate 104, no acute ischemic changes.  Rate is faster when compared to prior.  Assessment/Plan Principal Problem:   Alcohol dependence with withdrawal with complication (HCC) Active Problems:   Alcoholic hepatitis without ascites   Hyperbilirubinemia   Hypokalemia   Hypomagnesemia   Tobacco use   Anxiety and depression   Larry Adams a [age].o. male with medical history significant for severe alcohol use disorder with dependence, substance use disorder (heroin, benzodiazepines), hepatic steatosis, depression who is admitted with alcohol withdrawal with acute alcoholic hepatitis.  Assessment and Plan: * Alcohol dependence with withdrawal with complication (HCC) Alcoholic hepatitis with hyperbilirubinemia High risk alcohol withdrawal with likely alcoholic hepatitis.  T. bili 19.4 with AST 325, ALT 70, alk phos 187.  CT A/P shows gallbladder sludge without evidence of cholecystitis or CBD dilation.  Discriminant factor elevated at 47. -Admit to stepdown unit -Start on CIWA protocol with Ativan as needed -Start on Librium taper -Start on prednisone 40 mg daily -Continue IV fluid hydration overnight -Continue thiamine, folate, MVM -Consult TOC -Follow acute hepatitis panel  Hypomagnesemia IV supplement given.  Repeat labs in AM.  Hypokalemia Supplement orally and repeat labs in AM.  Anxiety and depression Not on any current treatment for mood as an outpatient.  Tobacco use Smoking about half pack per day.  Nicotine patch provided.  DVT prophylaxis: enoxaparin (LOVENOX) injection 40 mg Start:  11/22/21 2030 Code Status: Full code Family Communication: Father at bedside Disposition Plan: From home, dispo pending clinical progress Consults called: EDP discussed with on-call GI and PCCM Severity of Illness: The appropriate patient status for this patient is INPATIENT. Inpatient status is judged to be reasonable and necessary in order to provide the required intensity of service to ensure the patient's safety. The patient's presenting symptoms, physical exam findings, and initial radiographic and laboratory data in the context of their chronic comorbidities is felt to place them at high risk for further clinical deterioration. Furthermore, it is not anticipated that the patient will be medically stable for discharge from the  hospital within 2 midnights of admission.   * I certify that at the point of admission it is my clinical judgment that the patient will require inpatient hospital care spanning beyond 2 midnights from the point of admission due to high intensity of service, high risk for further deterioration and high frequency of surveillance required.Zada Finders MD [facility]  If 7PM-7AM, please contact night-coverage [email or web address]  11/22/2021, 8:46 PM

## 2021-11-22 NOTE — Assessment & Plan Note (Signed)
IV supplement given.  Repeat labs in AM. 

## 2021-11-22 NOTE — ED Notes (Signed)
Pt provided with turkey sandwich and ginger ale.

## 2021-11-22 NOTE — Assessment & Plan Note (Signed)
Supplement orally and repeat labs in AM.

## 2021-11-22 NOTE — Assessment & Plan Note (Addendum)
Alcoholic hepatitis with hyperbilirubinemia High risk alcohol withdrawal with likely alcoholic hepatitis.  T. bili 19.4 with AST 325, ALT 70, alk phos 187.  CT A/P shows gallbladder sludge without evidence of cholecystitis or CBD dilation.  Discriminant factor elevated at 47. -Admit to stepdown unit -Start on CIWA protocol with Ativan as needed -Start on Librium taper -Start on prednisone 40 mg daily -Continue IV fluid hydration overnight -Continue thiamine, folate, MVM -Consult TOC -Follow acute hepatitis panel

## 2021-11-22 NOTE — Hospital Course (Signed)
Larry Adams is a 36 y.o. male with medical history significant for severe alcohol use disorder with dependence, substance use disorder (heroin, benzodiazepines), hepatic steatosis, depression who is admitted with alcohol withdrawal with acute alcoholic hepatitis.

## 2021-11-23 DIAGNOSIS — F419 Anxiety disorder, unspecified: Secondary | ICD-10-CM

## 2021-11-23 DIAGNOSIS — F32A Depression, unspecified: Secondary | ICD-10-CM

## 2021-11-23 DIAGNOSIS — F10239 Alcohol dependence with withdrawal, unspecified: Secondary | ICD-10-CM | POA: Diagnosis not present

## 2021-11-23 DIAGNOSIS — K7010 Alcoholic hepatitis without ascites: Secondary | ICD-10-CM | POA: Diagnosis not present

## 2021-11-23 DIAGNOSIS — E876 Hypokalemia: Secondary | ICD-10-CM | POA: Diagnosis not present

## 2021-11-23 LAB — MAGNESIUM: Magnesium: 1.7 mg/dL (ref 1.7–2.4)

## 2021-11-23 LAB — HEPATITIS PANEL, ACUTE
HCV Ab: REACTIVE — AB
Hep A IgM: NONREACTIVE
Hep B C IgM: NONREACTIVE
Hepatitis B Surface Ag: NONREACTIVE

## 2021-11-23 LAB — AMMONIA: Ammonia: 74 umol/L — ABNORMAL HIGH (ref 9–35)

## 2021-11-23 LAB — COMPREHENSIVE METABOLIC PANEL
ALT: 57 U/L — ABNORMAL HIGH (ref 0–44)
AST: 257 U/L — ABNORMAL HIGH (ref 15–41)
Albumin: 2.5 g/dL — ABNORMAL LOW (ref 3.5–5.0)
Alkaline Phosphatase: 160 U/L — ABNORMAL HIGH (ref 38–126)
Anion gap: 7 (ref 5–15)
BUN: 8 mg/dL (ref 6–20)
CO2: 31 mmol/L (ref 22–32)
Calcium: 8.7 mg/dL — ABNORMAL LOW (ref 8.9–10.3)
Chloride: 97 mmol/L — ABNORMAL LOW (ref 98–111)
Creatinine, Ser: 0.61 mg/dL (ref 0.61–1.24)
GFR, Estimated: >60 mL/min (ref >60)
Glucose, Bld: 92 mg/dL (ref 70–99)
Potassium: 3.3 mmol/L — ABNORMAL LOW (ref 3.5–5.1)
Sodium: 135 mmol/L (ref 135–145)
Total Bilirubin: 18.8 mg/dL (ref 0.3–1.2)
Total Protein: 6.6 g/dL (ref 6.5–8.1)

## 2021-11-23 LAB — CBC
HCT: 37.9 % — ABNORMAL LOW (ref 39.0–52.0)
Hemoglobin: 13.4 g/dL (ref 13.0–17.0)
MCH: 33.8 pg (ref 26.0–34.0)
MCHC: 35.4 g/dL (ref 30.0–36.0)
MCV: 95.5 fL (ref 80.0–100.0)
Platelets: 131 10*3/uL — ABNORMAL LOW (ref 150–400)
RBC: 3.97 MIL/uL — ABNORMAL LOW (ref 4.22–5.81)
RDW: 19.2 % — ABNORMAL HIGH (ref 11.5–15.5)
WBC: 9.5 10*3/uL (ref 4.0–10.5)
nRBC: 0 % (ref 0.0–0.2)

## 2021-11-23 LAB — PHOSPHORUS: Phosphorus: 2.8 mg/dL (ref 2.5–4.6)

## 2021-11-23 LAB — MRSA NEXT GEN BY PCR, NASAL: MRSA by PCR Next Gen: NOT DETECTED

## 2021-11-23 LAB — PROTIME-INR
INR: 1.5 — ABNORMAL HIGH (ref 0.8–1.2)
Prothrombin Time: 17.8 seconds — ABNORMAL HIGH (ref 11.4–15.2)

## 2021-11-23 MED ORDER — PREDNISOLONE 5 MG PO TABS
40.0000 mg | ORAL_TABLET | Freq: Every day | ORAL | Status: DC
  Administered 2021-11-23 – 2021-11-27 (×5): 40 mg via ORAL
  Filled 2021-11-23 (×7): qty 8

## 2021-11-23 MED ORDER — DEXMEDETOMIDINE HCL IN NACL 200 MCG/50ML IV SOLN
INTRAVENOUS | Status: AC
  Administered 2021-11-23: 0.7 ug/kg/h via INTRAVENOUS
  Filled 2021-11-23: qty 50

## 2021-11-23 MED ORDER — POTASSIUM CHLORIDE IN NACL 40-0.9 MEQ/L-% IV SOLN
INTRAVENOUS | Status: DC
  Filled 2021-11-23 (×3): qty 1000

## 2021-11-23 MED ORDER — DEXMEDETOMIDINE HCL IN NACL 200 MCG/50ML IV SOLN: 0.4000 ug/kg/h | INTRAVENOUS | Status: DC

## 2021-11-23 MED ORDER — LACTULOSE 10 GM/15ML PO SOLN
20.0000 g | Freq: Two times a day (BID) | ORAL | Status: DC
  Administered 2021-11-23 – 2021-11-24 (×2): 20 g via ORAL
  Filled 2021-11-23 (×3): qty 30

## 2021-11-23 MED ORDER — LACTATED RINGERS IV SOLN: INTRAVENOUS | Status: DC

## 2021-11-23 MED ORDER — DEXMEDETOMIDINE HCL IN NACL 400 MCG/100ML IV SOLN
0.4000 ug/kg/h | INTRAVENOUS | Status: DC
  Administered 2021-11-23: 0.7 ug/kg/h via INTRAVENOUS
  Administered 2021-11-23: 0.9 ug/kg/h via INTRAVENOUS
  Administered 2021-11-23: 0.8 ug/kg/h via INTRAVENOUS
  Administered 2021-11-23: 0.6 ug/kg/h via INTRAVENOUS
  Administered 2021-11-23: 0.9 ug/kg/h via INTRAVENOUS
  Administered 2021-11-24: 0.7 ug/kg/h via INTRAVENOUS
  Administered 2021-11-24: 0.6 ug/kg/h via INTRAVENOUS
  Administered 2021-11-24: 0.4 ug/kg/h via INTRAVENOUS
  Administered 2021-11-25: 1.1 ug/kg/h via INTRAVENOUS
  Administered 2021-11-25: 1 ug/kg/h via INTRAVENOUS
  Filled 2021-11-23 (×9): qty 100

## 2021-11-23 MED ORDER — QUETIAPINE FUMARATE 50 MG PO TABS
25.0000 mg | ORAL_TABLET | Freq: Once | ORAL | Status: AC
  Administered 2021-11-23: 25 mg via ORAL
  Filled 2021-11-23: qty 1

## 2021-11-23 NOTE — Progress Notes (Signed)
Nurse reached out to [facility] provider [place], NP in an effort to get precedex for patient. Patient is not responding to IV ativan. Patient has had 4 doses so far. See MAR for intervention. Will continue to monitor.

## 2021-11-23 NOTE — Progress Notes (Signed)
Due to increased aggression toward the staff and harm to himself. Precedex was started on patient. Medication was titrate appropriately and bilateral wrist restraints removed. Patient was bathed again and mittens was placed on patient's hands. Will continue to monitor.

## 2021-11-23 NOTE — Progress Notes (Signed)
PROGRESS NOTE    Larry Adams  ASN:[id number] DOB: June 08, 1986 DOA: 11/22/2021 PCP: Patient, No Pcp Per (Inactive)    Brief Narrative:  36 y/o male admitted with persistent nausea and vomiting, found to have alcoholic hepatitis and hyperbilirubinemia. He developed alcohol withdrawal and was admitted to stepdown necessitating precedex infusion.   Assessment & Plan:   Principal Problem:   Alcohol dependence with withdrawal with complication [facility]) Active Problems:   Alcoholic hepatitis without ascites   Hyperbilirubinemia   Hypokalemia   Hypomagnesemia   Tobacco use   Anxiety and depression   Alcohol withdrawal with delirium tremens -Last drink of ETOH 5/27 -Initially placed on CIWA protocol with Ativan -Due to persistent agitation, overnight was placed on Precedex infusion -Requested PCCM evaluation of patient -Continue supportive care  Alcoholic hepatitis with hyperbilirubinemia -T. bili is 19.4 on admission is acutely elevated (bilirubin was 2.3 in 09/2021) -CT abdomen did not show any obstructive biliary process -Viral hepatitis panel in process -Suspect this is related to alcohol -Discriminant function elevated at 47 on admission, now 41 -Case was discussed by EDP with GI, with recommendations to start on steroids.  Started on prednisolone -Continue to follow labs  Hypokalemia/Hypomagnesemia -replace  Hyperammonemia -ammonia  74 on admission -started on lactulose   DVT prophylaxis: enoxaparin (LOVENOX) injection 40 mg Start: 11/22/21 2030  Code Status: Full code Family Communication: updated patient father over the phone Disposition Plan: Status is: Inpatient Remains inpatient appropriate because: Continued altered mental status extremities     Consultants:  PCCM  Procedures:    Antimicrobials:      Subjective: He is somnolent, briefly wakes up to voice and mumbles answers.  Objective: Vitals:   11/23/21 0700 11/23/21 0800 11/23/21 0810  11/23/21 0906  BP: 120/70 (!) 94/50  (!) 107/53  Pulse: (!) 107   94  Resp: (!) 26 (!) 27  (!) 29  Temp:   98.3 F (36.8 C)   TempSrc:   Axillary   SpO2: 96% 98%  100%  Weight:      Height:        Intake/Output Summary (Last 24 hours) at 11/23/2021 1048 Last data filed at 11/23/2021 0914 Gross per 24 hour  Intake 1162.83 ml  Output 425 ml  Net 737.83 ml   Filed Weights   11/22/21 1410 11/22/21 2241  Weight: 127 kg 129.8 kg    Examination:  General exam: Somnolent Respiratory system: Clear to auscultation. Respiratory effort normal. Cardiovascular system: S1 & S2 heard, RRR. No JVD, murmurs, rubs, gallops or clicks. No pedal edema. Gastrointestinal system: Abdomen is nondistended, soft and nontender. No organomegaly or masses felt. Normal bowel sounds heard. Central nervous system: \No focal neurological deficits. Extremities: Symmetric 5 x 5 power. Skin: No rashes, lesions or ulcers Psychiatry: Somnolent    Data Reviewed: I have personally reviewed following labs and imaging studies  CBC: Recent Labs  Lab 11/22/21 1454 11/23/21 0316  WBC 9.5 9.5  NEUTROABS 6.1  --   HGB 14.6 13.4  HCT 40.7 37.9*  MCV 95.3 95.5  PLT 139* 131*   Basic Metabolic Panel: Recent Labs  Lab 11/22/21 1454 11/23/21 0316  NA 134* 135  K 3.3* 3.3*  CL 93* 97*  CO2 29 31  GLUCOSE 108* 92  BUN 9 8  CREATININE 0.70 0.61  CALCIUM 8.7* 8.7*  MG 1.4* 1.7  PHOS  --  2.8   GFR: Estimated Creatinine Clearance: 180.4 mL/min (by C-G formula based on SCr of 0.61 mg/dL). Liver Function  Tests: Recent Labs  Lab 11/22/21 1454 11/23/21 0316  AST 325* 257*  ALT 70* 57*  ALKPHOS 187* 160*  BILITOT 19.4* 18.8*  PROT 7.7 6.6  ALBUMIN 2.9* 2.5*   No results for input(s): LIPASE, AMYLASE in the last 168 hours. Recent Labs  Lab 11/23/21 0522  AMMONIA 74*   Coagulation Profile: Recent Labs  Lab 11/22/21 1648 11/23/21 0316  INR 1.5* 1.5*   Cardiac Enzymes: Recent Labs  Lab  11/22/21 1454  CKTOTAL 189   BNP (last 3 results) No results for input(s): PROBNP in the last 8760 hours. HbA1C: No results for input(s): HGBA1C in the last 72 hours. CBG: No results for input(s): GLUCAP in the last 168 hours. Lipid Profile: No results for input(s): CHOL, HDL, LDLCALC, TRIG, CHOLHDL, LDLDIRECT in the last 72 hours. Thyroid Function Tests: No results for input(s): TSH, T4TOTAL, FREET4, T3FREE, THYROIDAB in the last 72 hours. Anemia Panel: No results for input(s): VITAMINB12, FOLATE, FERRITIN, TIBC, IRON, RETICCTPCT in the last 72 hours. Sepsis Labs: No results for input(s): PROCALCITON, LATICACIDVEN in the last 168 hours.  Recent Results (from the past 240 hour(s))  MRSA Next Gen by PCR, Nasal     Status: None   Collection Time: 11/22/21  9:22 PM   Specimen: Nasal Mucosa; Nasal Swab  Result Value Ref Range Status   MRSA by PCR Next Gen NOT DETECTED NOT DETECTED Final    Comment: (NOTE) The GeneXpert MRSA Assay (FDA approved for NASAL specimens only), is one component of a comprehensive MRSA colonization surveillance program. It is not intended to diagnose MRSA infection nor to guide or monitor treatment for MRSA infections. Test performance is not FDA approved in patients less than 55 years old. Performed at [facility], [address] [place], [place] [address]          Radiology Studies: CT ABDOMEN PELVIS W CONTRAST  Result Date: 11/22/2021 CLINICAL DATA:  Nausea and vomiting, hyperbilirubinemia EXAM: CT ABDOMEN AND PELVIS WITH CONTRAST TECHNIQUE: Multidetector CT imaging of the abdomen and pelvis was performed using the standard protocol following bolus administration of intravenous contrast. RADIATION DOSE REDUCTION: This exam was performed according to the departmental dose-optimization program which includes automated exposure control, adjustment of the mA and/or kV according to patient size and/or use of iterative reconstruction  technique. CONTRAST:  OMNIPAQUE IOHEXOL 300 MG/ML  SOLN COMPARISON:  12/18/2012 FINDINGS: Lower chest: No acute pleural or parenchymal lung disease. Hepatobiliary: Severe hepatic steatosis again noted. Likely area of focal fatty sparing within the inferior right lobe. No intrahepatic biliary duct dilation. Moderate gallbladder distension without evidence of cholelithiasis or cholecystitis. High attenuation material layering dependently in the gallbladder consistent with sludge. No biliary duct dilation. Pancreas: Unremarkable. No pancreatic ductal dilatation or surrounding inflammatory changes. Spleen: Normal in size without focal abnormality. Adrenals/Urinary Tract: Adrenal glands are unremarkable. Kidneys are normal, without renal calculi, focal lesion, or hydronephrosis. Bladder is unremarkable. Stomach/Bowel: No bowel obstruction or ileus. Normal appendix right lower quadrant. No bowel wall thickening or inflammatory change. Vascular/Lymphatic: Stable communication between the portal vein and IVC and hepatic veins, consistent with portacaval shunt. No other significant vascular findings. No pathologic adenopathy. Reproductive: Prostate is unremarkable. Other: No free fluid or free intraperitoneal gas. No abdominal wall hernia. Musculoskeletal: Subacute to chronic healing left posterior ninth, tenth, and eleventh rib fractures. No other acute bony abnormalities. Reconstructed images demonstrate no additional findings. IMPRESSION: 1. Gallbladder sludge without evidence of cholelithiasis or cholecystitis. 2. Stable severe hepatic steatosis.  3. Stable spontaneous portacaval shunt. Electronically Signed   By: Sharlet Salina M.D.   On: 11/22/2021 16:35        Scheduled Meds:  chlordiazePOXIDE  25 mg Oral QID   Followed by   Melene Muller ON 11/24/2021] chlordiazePOXIDE  25 mg Oral TID   Followed by   Melene Muller ON 11/25/2021] chlordiazePOXIDE  25 mg Oral BH-qamhs   Followed by   Melene Muller ON 11/26/2021]  chlordiazePOXIDE  25 mg Oral Daily   Chlorhexidine Gluconate Cloth  6 each Topical Daily   enoxaparin (LOVENOX) injection  40 mg Subcutaneous Q24H   folic acid  1 mg Oral Daily   lactulose  20 g Oral BID   mouth rinse  15 mL Mouth Rinse BID   multivitamin with minerals  1 tablet Oral Daily   nicotine  21 mg Transdermal Daily   potassium chloride  40 mEq Oral Once   prednisoLONE  40 mg Oral Daily   sodium chloride flush  3 mL Intravenous Q12H   thiamine  100 mg Oral Daily   Or   thiamine  100 mg Intravenous Daily   Continuous Infusions:  dexmedetomidine (PRECEDEX) IV infusion 0.9 mcg/kg/hr (11/23/21 0914)   lactated ringers       LOS: 1 day    Time spent:    Erick Blinks, MD [facility]   If 7PM-7AM, please contact night-coverage [email or web address]  11/23/2021, 10:48 AM

## 2021-11-23 NOTE — Progress Notes (Signed)
Nurse reached out to provider Anna Genre, NP for restraints and precedex orders. Nurse still needs an order for patients restraints. Patient has removed all equipment and urinated in the patient bed. No new orders at this time. Will continue to monitor.

## 2021-11-23 NOTE — Progress Notes (Signed)
Patient has only voided 25 mLs this shift. Bladder scan showed >999 mLs of urine. Dr Kerry Hough notified by this RN and orders received to in-and-out catheterize. 1500 mLs of amber urine drained by catheter. RN will continue to monitor output.

## 2021-11-23 NOTE — Progress Notes (Signed)
Soft wrist restraints placed on patient due to removing of medical equipment. Nurse reached out to provider for order. Will continue to monitor.

## 2021-11-24 ENCOUNTER — Inpatient Hospital Stay (HOSPITAL_COMMUNITY): Payer: Commercial Managed Care - HMO

## 2021-11-24 DIAGNOSIS — K7010 Alcoholic hepatitis without ascites: Secondary | ICD-10-CM | POA: Diagnosis not present

## 2021-11-24 DIAGNOSIS — F419 Anxiety disorder, unspecified: Secondary | ICD-10-CM | POA: Diagnosis not present

## 2021-11-24 DIAGNOSIS — E876 Hypokalemia: Secondary | ICD-10-CM | POA: Diagnosis not present

## 2021-11-24 DIAGNOSIS — F10239 Alcohol dependence with withdrawal, unspecified: Secondary | ICD-10-CM | POA: Diagnosis not present

## 2021-11-24 LAB — COMPREHENSIVE METABOLIC PANEL
ALT: 55 U/L — ABNORMAL HIGH (ref 0–44)
AST: 217 U/L — ABNORMAL HIGH (ref 15–41)
Albumin: 2.1 g/dL — ABNORMAL LOW (ref 3.5–5.0)
Alkaline Phosphatase: 134 U/L — ABNORMAL HIGH (ref 38–126)
Anion gap: 6 (ref 5–15)
BUN: 16 mg/dL (ref 6–20)
CO2: 24 mmol/L (ref 22–32)
Calcium: 8.4 mg/dL — ABNORMAL LOW (ref 8.9–10.3)
Chloride: 106 mmol/L (ref 98–111)
Creatinine, Ser: 0.64 mg/dL (ref 0.61–1.24)
GFR, Estimated: >60 mL/min (ref >60)
Glucose, Bld: 134 mg/dL — ABNORMAL HIGH (ref 70–99)
Potassium: 4.8 mmol/L (ref 3.5–5.1)
Sodium: 136 mmol/L (ref 135–145)
Total Bilirubin: 19.7 mg/dL (ref 0.3–1.2)
Total Protein: 6.3 g/dL — ABNORMAL LOW (ref 6.5–8.1)

## 2021-11-24 LAB — CBC
HCT: 40.8 % (ref 39.0–52.0)
Hemoglobin: 14.1 g/dL (ref 13.0–17.0)
MCH: 34.4 pg — ABNORMAL HIGH (ref 26.0–34.0)
MCHC: 34.6 g/dL (ref 30.0–36.0)
MCV: 99.5 fL (ref 80.0–100.0)
Platelets: 148 10*3/uL — ABNORMAL LOW (ref 150–400)
RBC: 4.1 MIL/uL — ABNORMAL LOW (ref 4.22–5.81)
RDW: 19.7 % — ABNORMAL HIGH (ref 11.5–15.5)
WBC: 9.6 10*3/uL (ref 4.0–10.5)
nRBC: 0 % (ref 0.0–0.2)

## 2021-11-24 LAB — AMMONIA: Ammonia: 74 umol/L — ABNORMAL HIGH (ref 9–35)

## 2021-11-24 LAB — PROTIME-INR
INR: 1.7 — ABNORMAL HIGH (ref 0.8–1.2)
Prothrombin Time: 19.8 seconds — ABNORMAL HIGH (ref 11.4–15.2)

## 2021-11-24 MED ORDER — SODIUM CHLORIDE 0.9 % IV SOLN: INTRAVENOUS | Status: DC

## 2021-11-24 MED ORDER — SODIUM CHLORIDE 0.9 % IV BOLUS
1000.0000 mL | Freq: Once | INTRAVENOUS | Status: AC
  Administered 2021-11-24: 1000 mL via INTRAVENOUS

## 2021-11-24 MED ORDER — LACTULOSE 10 GM/15ML PO SOLN
20.0000 g | Freq: Three times a day (TID) | ORAL | Status: DC
Start: 2021-11-24 — End: 2021-11-26
  Administered 2021-11-24 – 2021-11-25 (×5): 20 g via ORAL
  Filled 2021-11-24 (×7): qty 30

## 2021-11-24 MED ORDER — ALBUMIN HUMAN 25 % IV SOLN
50.0000 g | Freq: Once | INTRAVENOUS | Status: AC
Start: 2021-11-24 — End: 2021-11-24
  Administered 2021-11-24: 50 g via INTRAVENOUS
  Filled 2021-11-24: qty 200

## 2021-11-24 NOTE — Consult Note (Signed)
   NAME:  Larry Adams, MRN:  [id number], DOB:  05/24/1986, LOS: 2 ADMISSION DATE:  11/22/2021, CONSULTATION DATE:  11/23/2021 REFERRING MD:  Pearletha Forge MD, CHIEF COMPLAINT: Alcohol withdrawal, requirement for Precedex drip  History of Present Illness:   36 year old with history of alcohol dependence admitted with acute alcoholic hepatitis, hyperbilirubinemia. He developed acute withdrawal, DTs. Admitted to stepdown unit and dtarted on Precedex drip.  Pertinent  Medical History    has a past medical history of Anxiety, Back pain, Depression, Drug overdose, and Mental disorder.   Significant Hospital Events: Including procedures, antibiotic start and stop dates in addition to other pertinent events   5/27 Admit   Interim History / Subjective:    Objective   Blood pressure (!) 87/41, pulse 66, temperature (!) 97.1 F (36.2 C), temperature source Axillary, resp. rate (!) 24, height 6\' 1"  (1.854 m), weight 129.8 kg, SpO2 93 %.        Intake/Output Summary (Last 24 hours) at 11/24/2021 1052 Last data filed at 11/24/2021 1018 Gross per 24 hour  Intake 2998.76 ml  Output 1525 ml  Net 1473.76 ml   Filed Weights   11/22/21 1410 11/22/21 2241  Weight: 127 kg 129.8 kg    Examination: Gen:      No acute distress HEENT:  EOMI, sclera anicteric Neck:     No masses; no thyromegaly Lungs:    Clear to auscultation bilaterally; normal respiratory effort CV:         Regular rate and rhythm; no murmurs Abd:      + bowel sounds; soft, non-tender; no palpable masses, no distension Ext:    No edema; adequate peripheral perfusion Skin:      Warm and dry; no rash Neuro: Sedated  Labs/imaging personally reviewed Significant for potassium 3.3, creatinine 0.61, AST 257, ALT 57, total bilirubin 18.8 WBC 9.6, hemoglobin 14.1, platelets 148  Resolved Hospital Problem list     Assessment & Plan:  Acute alcohol withdrawal with DTs Continue Precedex, wean as tolerated CIWA protocol and Librium  taper  Acute alcohol hepatitis Started on steroids due to high discriminant function Monitor LFTs.  Best Practice (right click and "Reselect all SmartList Selections" daily)   Per primary team  Critical care time:    The patient is critically ill with multiple organ system failure and requires high complexity decision making for assessment and support, frequent evaluation and titration of therapies, advanced monitoring, review of radiographic studies and interpretation of complex data.   Critical Care Time devoted to patient care services, exclusive of separately billable procedures, described in this note is 35 minutes.   Marshell Garfinkel MD [facility] See Amion for pager  If no response to pager , please call [phone] until 7pm After 7:00 pm call Elink  [phone] 11/24/2021, 11:04 AM

## 2021-11-24 NOTE — Progress Notes (Signed)
PROGRESS NOTE    Larry Adams  VEH:[id number] DOB: Nov 18, 1985 DOA: 11/22/2021 PCP: Patient, No Pcp Per (Inactive)    Brief Narrative:  36 y/o male admitted with persistent nausea and vomiting, found to have alcoholic hepatitis and hyperbilirubinemia. He developed alcohol withdrawal and was admitted to stepdown necessitating precedex infusion.   Assessment & Plan:   Principal Problem:   Alcohol dependence with withdrawal with complication (HCC) Active Problems:   Alcoholic hepatitis without ascites   Hyperbilirubinemia   Hypokalemia   Hypomagnesemia   Tobacco use   Anxiety and depression   Alcohol withdrawal with delirium tremens -Last drink of ETOH 5/27 -Initially placed on CIWA protocol with Ativan -Due to persistent agitation, overnight was placed on Precedex infusion -discussed with PCCM, Dr. Isaiah Serge, agreed with current management -also on librium taper -wean off precedex as tolerated -Continue supportive care  Alcoholic hepatitis with hyperbilirubinemia -T. bili is 19.4 on admission is acutely elevated (bilirubin was 2.3 in 09/2021) -CT abdomen did not show any obstructive biliary process -Viral hepatitis panel shows Hep C ab positive, HCV RNA has been ordered -Suspect this is related to alcohol -Discriminant function elevated at 47 on admission, now 41 -Case was discussed by EDP with GI, with recommendations to start on steroids.  Started on prednisolone -Continue to follow labs  Urinary retention -possibly related to meds -in and out cath for now  Hypotension -blood pressure running low this morning -bolus IV fluids -may be related to meds, will hold precedex to see if BP improves  Hypoxia -noted to require supplemental oxygen, currently on 5L -lungs clear on exam -check chest xray -?hypoventilation due to sedation -continue to follow  Hypokalemia/Hypomagnesemia -replace  Hyperammonemia -ammonia  74 on admission -started on lactulose   DVT  prophylaxis: enoxaparin (LOVENOX) injection 40 mg Start: 11/22/21 2030  Code Status: Full code Family Communication: updated patient father over the phone Disposition Plan: Status is: Inpatient Remains inpatient appropriate because: Continued altered mental status extremities     Consultants:  PCCM  Procedures:    Antimicrobials:      Subjective: Sleeping on my arrival, briefly wakes up to voice, says he thinks he is at home. Falls back asleep during conversation. Staff reports urinary retention requiring in and out cath.   Objective: Vitals:   11/24/21 0700 11/24/21 0800 11/24/21 0900 11/24/21 1000  BP: (!) 103/58 (!) 98/58 (!) 91/52   Pulse: 72 71 69 69  Resp: (!) 29 18 16  (!) 27  Temp: (!) 97.1 F (36.2 C)     TempSrc: Axillary     SpO2: 90% 91% 94% 94%  Weight:      Height:        Intake/Output Summary (Last 24 hours) at 11/24/2021 1024 Last data filed at 11/24/2021 1018 Gross per 24 hour  Intake 2998.76 ml  Output 1525 ml  Net 1473.76 ml   Filed Weights   11/22/21 1410 11/22/21 2241  Weight: 127 kg 129.8 kg    Examination:  General exam: somnolent, briefly wakes up to voice Respiratory system: Clear to auscultation. Respiratory effort normal. Cardiovascular system:RRR. No murmurs, rubs, gallops. Gastrointestinal system: Abdomen is nondistended, soft and nontender. No organomegaly or masses felt. Normal bowel sounds heard. Central nervous system: No focal neurological deficits. Extremities: No C/C/E, +pedal pulses Skin: No rashes, lesions or ulcers Psychiatry: somnolent    Data Reviewed: I have personally reviewed following labs and imaging studies  CBC: Recent Labs  Lab 11/22/21 1454 11/23/21 0316  WBC 9.5 9.5  NEUTROABS 6.1  --   HGB 14.6 13.4  HCT 40.7 37.9*  MCV 95.3 95.5  PLT 139* 131*   Basic Metabolic Panel: Recent Labs  Lab 11/22/21 1454 11/23/21 0316  NA 134* 135  K 3.3* 3.3*  CL 93* 97*  CO2 29 31  GLUCOSE 108* 92  BUN 9  8  CREATININE 0.70 0.61  CALCIUM 8.7* 8.7*  MG 1.4* 1.7  PHOS  --  2.8   GFR: Estimated Creatinine Clearance: 180.4 mL/min (by C-G formula based on SCr of 0.61 mg/dL). Liver Function Tests: Recent Labs  Lab 11/22/21 1454 11/23/21 0316  AST 325* 257*  ALT 70* 57*  ALKPHOS 187* 160*  BILITOT 19.4* 18.8*  PROT 7.7 6.6  ALBUMIN 2.9* 2.5*   No results for input(s): LIPASE, AMYLASE in the last 168 hours. Recent Labs  Lab 11/23/21 0522  AMMONIA 74*   Coagulation Profile: Recent Labs  Lab 11/22/21 1648 11/23/21 0316  INR 1.5* 1.5*   Cardiac Enzymes: Recent Labs  Lab 11/22/21 1454  CKTOTAL 189   BNP (last 3 results) No results for input(s): PROBNP in the last 8760 hours. HbA1C: No results for input(s): HGBA1C in the last 72 hours. CBG: No results for input(s): GLUCAP in the last 168 hours. Lipid Profile: No results for input(s): CHOL, HDL, LDLCALC, TRIG, CHOLHDL, LDLDIRECT in the last 72 hours. Thyroid Function Tests: No results for input(s): TSH, T4TOTAL, FREET4, T3FREE, THYROIDAB in the last 72 hours. Anemia Panel: No results for input(s): VITAMINB12, FOLATE, FERRITIN, TIBC, IRON, RETICCTPCT in the last 72 hours. Sepsis Labs: No results for input(s): PROCALCITON, LATICACIDVEN in the last 168 hours.  Recent Results (from the past 240 hour(s))  MRSA Next Gen by PCR, Nasal     Status: None   Collection Time: 11/22/21  9:22 PM   Specimen: Nasal Mucosa; Nasal Swab  Result Value Ref Range Status   MRSA by PCR Next Gen NOT DETECTED NOT DETECTED Final    Comment: (NOTE) The GeneXpert MRSA Assay (FDA approved for NASAL specimens only), is one component of a comprehensive MRSA colonization surveillance program. It is not intended to diagnose MRSA infection nor to guide or monitor treatment for MRSA infections. Test performance is not FDA approved in patients less than 69 years old. Performed at [facility], [address] [place], [place]  [address]          Radiology Studies: CT ABDOMEN PELVIS W CONTRAST  Result Date: 11/22/2021 CLINICAL DATA:  Nausea and vomiting, hyperbilirubinemia EXAM: CT ABDOMEN AND PELVIS WITH CONTRAST TECHNIQUE: Multidetector CT imaging of the abdomen and pelvis was performed using the standard protocol following bolus administration of intravenous contrast. RADIATION DOSE REDUCTION: This exam was performed according to the departmental dose-optimization program which includes automated exposure control, adjustment of the mA and/or kV according to patient size and/or use of iterative reconstruction technique. CONTRAST:  OMNIPAQUE IOHEXOL 300 MG/ML  SOLN COMPARISON:  12/18/2012 FINDINGS: Lower chest: No acute pleural or parenchymal lung disease. Hepatobiliary: Severe hepatic steatosis again noted. Likely area of focal fatty sparing within the inferior right lobe. No intrahepatic biliary duct dilation. Moderate gallbladder distension without evidence of cholelithiasis or cholecystitis. High attenuation material layering dependently in the gallbladder consistent with sludge. No biliary duct dilation. Pancreas: Unremarkable. No pancreatic ductal dilatation or surrounding inflammatory changes. Spleen: Normal in size without focal abnormality. Adrenals/Urinary Tract: Adrenal glands are unremarkable. Kidneys are normal, without renal calculi, focal lesion, or hydronephrosis. Bladder is unremarkable. Stomach/Bowel: No  bowel obstruction or ileus. Normal appendix right lower quadrant. No bowel wall thickening or inflammatory change. Vascular/Lymphatic: Stable communication between the portal vein and IVC and hepatic veins, consistent with portacaval shunt. No other significant vascular findings. No pathologic adenopathy. Reproductive: Prostate is unremarkable. Other: No free fluid or free intraperitoneal gas. No abdominal wall hernia. Musculoskeletal: Subacute to chronic healing left posterior ninth, tenth, and eleventh rib  fractures. No other acute bony abnormalities. Reconstructed images demonstrate no additional findings. IMPRESSION: 1. Gallbladder sludge without evidence of cholelithiasis or cholecystitis. 2. Stable severe hepatic steatosis. 3. Stable spontaneous portacaval shunt. Electronically Signed   By: Sharlet Salina M.D.   On: 11/22/2021 16:35        Scheduled Meds:  chlordiazePOXIDE  25 mg Oral QID   Followed by   chlordiazePOXIDE  25 mg Oral TID   Followed by   Melene Muller ON 11/25/2021] chlordiazePOXIDE  25 mg Oral BH-qamhs   Followed by   Melene Muller ON 11/26/2021] chlordiazePOXIDE  25 mg Oral Daily   Chlorhexidine Gluconate Cloth  6 each Topical Daily   enoxaparin (LOVENOX) injection  40 mg Subcutaneous Q24H   folic acid  1 mg Oral Daily   lactulose  20 g Oral BID   mouth rinse  15 mL Mouth Rinse BID   multivitamin with minerals  1 tablet Oral Daily   nicotine  21 mg Transdermal Daily   potassium chloride  40 mEq Oral Once   prednisoLONE  40 mg Oral Daily   sodium chloride flush  3 mL Intravenous Q12H   thiamine  100 mg Oral Daily   Or   thiamine  100 mg Intravenous Daily   Continuous Infusions:  0.9 % NaCl with KCl 40 mEq / L 100 mL/hr at 11/24/21 1018   dexmedetomidine (PRECEDEX) IV infusion 0.4 mcg/kg/hr (11/24/21 1018)   sodium chloride       LOS: 2 days    Time spent:    Erick Blinks, MD [facility]   If 7PM-7AM, please contact night-coverage [email or web address]  11/24/2021, 10:24 AM

## 2021-11-25 ENCOUNTER — Encounter (HOSPITAL_COMMUNITY): Payer: Self-pay | Admitting: Internal Medicine

## 2021-11-25 DIAGNOSIS — F419 Anxiety disorder, unspecified: Secondary | ICD-10-CM | POA: Diagnosis not present

## 2021-11-25 DIAGNOSIS — E876 Hypokalemia: Secondary | ICD-10-CM | POA: Diagnosis not present

## 2021-11-25 DIAGNOSIS — F32A Depression, unspecified: Secondary | ICD-10-CM | POA: Diagnosis not present

## 2021-11-25 DIAGNOSIS — K7010 Alcoholic hepatitis without ascites: Secondary | ICD-10-CM | POA: Diagnosis not present

## 2021-11-25 DIAGNOSIS — F10239 Alcohol dependence with withdrawal, unspecified: Secondary | ICD-10-CM | POA: Diagnosis not present

## 2021-11-25 LAB — COMPREHENSIVE METABOLIC PANEL
ALT: 47 U/L — ABNORMAL HIGH (ref 0–44)
AST: 173 U/L — ABNORMAL HIGH (ref 15–41)
Albumin: 2.6 g/dL — ABNORMAL LOW (ref 3.5–5.0)
Alkaline Phosphatase: 125 U/L (ref 38–126)
Anion gap: 6 (ref 5–15)
BUN: 16 mg/dL (ref 6–20)
CO2: 24 mmol/L (ref 22–32)
Calcium: 8.6 mg/dL — ABNORMAL LOW (ref 8.9–10.3)
Chloride: 108 mmol/L (ref 98–111)
Creatinine, Ser: 0.5 mg/dL — ABNORMAL LOW (ref 0.61–1.24)
GFR, Estimated: >60 mL/min (ref >60)
Glucose, Bld: 126 mg/dL — ABNORMAL HIGH (ref 70–99)
Potassium: 4.2 mmol/L (ref 3.5–5.1)
Sodium: 138 mmol/L (ref 135–145)
Total Bilirubin: 16.3 mg/dL — ABNORMAL HIGH (ref 0.3–1.2)
Total Protein: 6.2 g/dL — ABNORMAL LOW (ref 6.5–8.1)

## 2021-11-25 LAB — HCV RNA QUANT: HCV Quantitative: NOT DETECTED IU/mL (ref >50)

## 2021-11-25 LAB — CBC
HCT: 36.3 % — ABNORMAL LOW (ref 39.0–52.0)
Hemoglobin: 12.4 g/dL — ABNORMAL LOW (ref 13.0–17.0)
MCH: 34.2 pg — ABNORMAL HIGH (ref 26.0–34.0)
MCHC: 34.2 g/dL (ref 30.0–36.0)
MCV: 100 fL (ref 80.0–100.0)
Platelets: 151 10*3/uL (ref 150–400)
RBC: 3.63 MIL/uL — ABNORMAL LOW (ref 4.22–5.81)
RDW: 20.5 % — ABNORMAL HIGH (ref 11.5–15.5)
WBC: 10.1 10*3/uL (ref 4.0–10.5)
nRBC: 0 % (ref 0.0–0.2)

## 2021-11-25 LAB — PROTIME-INR
INR: 1.6 — ABNORMAL HIGH (ref 0.8–1.2)
Prothrombin Time: 18.7 seconds — ABNORMAL HIGH (ref 11.4–15.2)

## 2021-11-25 LAB — MAGNESIUM: Magnesium: 2 mg/dL (ref 1.7–2.4)

## 2021-11-25 MED ORDER — LORAZEPAM 1 MG PO TABS
1.0000 mg | ORAL_TABLET | ORAL | Status: DC | PRN
  Administered 2021-11-25 (×2): 1 mg via ORAL
  Administered 2021-11-26 (×2): 2 mg via ORAL
  Administered 2021-11-26 (×2): 1 mg via ORAL
  Administered 2021-11-27 (×2): 3 mg via ORAL
  Filled 2021-11-25 (×2): qty 2
  Filled 2021-11-25: qty 1
  Filled 2021-11-25: qty 3
  Filled 2021-11-25: qty 1
  Filled 2021-11-25: qty 3
  Filled 2021-11-25: qty 1

## 2021-11-25 MED ORDER — LORAZEPAM 2 MG/ML IJ SOLN
1.0000 mg | INTRAMUSCULAR | Status: DC | PRN
  Administered 2021-11-26 (×2): 2 mg via INTRAVENOUS
  Administered 2021-11-26: 1 mg via INTRAVENOUS
  Administered 2021-11-27: 2 mg via INTRAVENOUS
  Administered 2021-11-27: 4 mg via INTRAVENOUS
  Administered 2021-11-27 (×2): 2 mg via INTRAVENOUS
  Administered 2021-11-27: 4 mg via INTRAVENOUS
  Administered 2021-11-27: 2 mg via INTRAVENOUS
  Filled 2021-11-25 (×4): qty 1
  Filled 2021-11-25 (×2): qty 2
  Filled 2021-11-25 (×3): qty 1

## 2021-11-25 MED ORDER — PHYTONADIONE 5 MG PO TABS
10.0000 mg | ORAL_TABLET | Freq: Once | ORAL | Status: AC
  Administered 2021-11-25: 10 mg via ORAL
  Filled 2021-11-25: qty 2

## 2021-11-25 NOTE — Progress Notes (Addendum)
   NAME:  Larry Adams, MRN:  [id number], DOB:  [birth year], LOS: 3 ADMISSION DATE:  11/22/2021, CONSULTATION DATE:  11/23/2021 REFERRING MD:  Pearletha Forge MD, CHIEF COMPLAINT: Alcohol withdrawal, requirement for Precedex drip  History of Present Illness:  36 year old M with history of alcohol dependence admitted 5/27 with vomiting, concern for alcohol withdrawal.  Work up consistent with acute alcoholic hepatitis, hyperbilirubinemia. He developed acute withdrawal, DTs. Admitted to stepdown unit and started on Precedex infusion.  Pertinent  Medical History  Anxiety  Depression  Back Pain  Drug Overdose  Mental Disorder    Significant Hospital Events: Including procedures, antibiotic start and stop dates in addition to other pertinent events   5/27 Admit with ETOH withdrawal   Interim History / Subjective:  Afebrile  On precedex infusion at 0.5  5L [place] > noted episodes of desaturation during sleep  I/O 810 UOP, 3.5L+ in last 24 hours   Objective   Blood pressure 115/83, pulse 69, temperature 98.1 F (36.7 C), temperature source Oral, resp. rate 17, height 6\' 1"  (1.854 m), weight 129.8 kg, SpO2 92 %.        Intake/Output Summary (Last 24 hours) at 11/25/2021 0716 Last data filed at 11/25/2021 0600 Gross per 24 hour  Intake 4337.27 ml  Output 810 ml  Net 3527.27 ml   Filed Weights   11/22/21 1410 11/22/21 2241  Weight: 127 kg 129.8 kg    Examination: General: chronically ill appearing adult male lying in bed, disheveled  HEENT: MM pink/moist, scleral icterus, pupils equal / reactive  Neuro: sedate, awakens to voice, speech clear, follows commands, oriented to self  CV: s1s2 RRR, no m/r/g PULM: non-labored at rest, lungs bilaterally clear GI: soft, bsx4 active  Extremities: warm/dry, trace BLE pre-tibial pitting edema  Skin: no rashes or lesions  Resolved Hospital Problem list     Assessment & Plan:   Acute Alcohol Withdrawal with DTs -stop precedex infusion with  sedation, follow clinical exam  -CIWA protocol with librium taper  -follow for withdrawal symptoms  -PRN ativan IV  -diet as tolerated  -promote sleep / wake cycle  -delirium prevention measures  -continue thiamine, folate, MVI -lactulose TID  -NS at 100 ml/hr   Acute Alcoholic Hepatitis Maddrey's 42.9 on admission  -continue prednisolone 40 mg QD , initiated in setting of high discriminant function  -trend LFT's   Best Practice (right click and "Reselect all SmartList Selections" daily)   Per primary team  Critical care time: 30 minutes   Noe Gens, MSN, APRN, NP-C, AGACNP-BC [facility] 11/25/2021, 7:17 AM   Please see [email or web address] for pager details.   From 7A-7P if no response, please call [phone] After hours, please call ELink [phone]

## 2021-11-25 NOTE — Consult Note (Signed)
Referring Provider: [facility] Primary Care Physician:  Patient, No Pcp Per (Inactive) Primary Gastroenterologist:  Althia Forts  Reason for Consultation:  alcoholic hepatitis  HPI: Larry Adams is a 36 y.o. male history of alcohol dependence admitted with acute alcoholic hepatitis presents for evaluation of hyperbilirubinemia.  Patient originally presented to ED 5/27 for evaluation of nausea and bilious emesis. Patient was admitted for concern of alcohol withdrawal.  History of chronic heavy alcohol use on a daily basis. Drinks five 12% 16 oz cans of malt liquor daily. Abruptly stopped due to finances.  CT abdomen/pelvis with contrast showed gallbladder sludge without evidence of cholelithiasis or cholecystitis.  No biliary ductal dilation. Stable severe hepatic steatosis and stable spontaneous portacaval shunt noted.  History difficult to obtain as patient is lethargic/sedated and has difficulty remaining engaged in conversation.  Past Medical History:  Diagnosis Date   Anxiety    Back pain    Depression    Drug overdose    Mental disorder     Past Surgical History:  Procedure Laterality Date   HERNIA REPAIR      Prior to Admission medications   Medication Sig Start Date End Date Taking? Authorizing Provider  chlordiazePOXIDE (LIBRIUM) 25 MG capsule Take 2 capsules (22m) by mouth three times daily for 1 day, then take 1-2 caps (25-532m twice a day for 1 day, then 1-2 caps (25-5019mdaily for 1 day. Patient not taking: Reported on 11/24/2021 09/12/21   NanVarney BilesD  furosemide (LASIX) 20 MG tablet Take 1 tablet (20 mg total) by mouth daily. 08/31/21   ZacFredia SorrowD    Scheduled Meds:  chlordiazePOXIDE  25 mg Oral TID   Followed by   chlordiazePOXIDE  25 mg Oral BH-qamhs   Followed by   [STDerrill Memo 11/26/2021] chlordiazePOXIDE  25 mg Oral Daily   Chlorhexidine Gluconate Cloth  6 each Topical Daily   enoxaparin (LOVENOX) injection  40 mg Subcutaneous [id number] acid   1 mg Oral Daily   lactulose  20 g Oral TID   mouth rinse  15 mL Mouth Rinse BID   multivitamin with minerals  1 tablet Oral Daily   nicotine  21 mg Transdermal Daily   potassium chloride  40 mEq Oral Once   prednisoLONE  40 mg Oral Daily   sodium chloride flush  3 mL Intravenous Q12H   thiamine  100 mg Oral Daily   Or   thiamine  100 mg Intravenous Daily   Continuous Infusions:  sodium chloride 100 mL/hr at 11/25/21 0600   dexmedetomidine (PRECEDEX) IV infusion 0.7 mcg/kg/hr (11/25/21 0600)   PRN Meds:.loperamide, LORazepam **OR** LORazepam, ondansetron **OR** ondansetron (ZOFRAN) IV  Allergies as of 11/22/2021 - Review Complete 11/22/2021  Allergen Reaction Noted   Morphine and related Hives 01/03/2013   Vancomycin Itching and Other (See Comments) 03/24/2021    Family History  Family history unknown: Yes    Social History   Socioeconomic History   Marital status: Single    Spouse name: Not on file   Number of children: Not on file   Years of education: Not on file   Highest education level: Not on file  Occupational History   Not on file  Tobacco Use   Smoking status: Every Day    Packs/day: 1.00    Types: Cigarettes   Smokeless tobacco: Former  Vap[employer]ific laboratory techniciane: Former  Substance and Sexual Activity   Alcohol use: Yes    Alcohol/week: 6.0 standard drinks  Types: 6 Cans of beer per week    Comment: PTA 25 oz of beer; drinking all day everyday for 2 months   Drug use: Not Currently    Types: IV, Fentanyl    Comment: Overdose on Heroin; IV Fentanyl use   Sexual activity: Yes  Other Topics Concern   Not on file  Social History Narrative   Not on file   Social Determinants of Health   Financial Resource Strain: Not on file  Food Insecurity: Not on file  Transportation Needs: Not on file  Physical Activity: Not on file  Stress: Not on file  Social Connections: Not on file  Intimate Partner Violence: Not on file    Review of Systems: Review of  Systems  Unable to perform ROS: Mental status change    Physical Exam:Physical Exam Constitutional:      Appearance: He is obese.  HENT:     Head: Normocephalic and atraumatic.     Nose: Nose normal. No congestion.     Mouth/Throat:     Mouth: Mucous membranes are moist.     Pharynx: Oropharynx is clear.  Eyes:     General: Scleral icterus present.     Extraocular Movements: Extraocular movements intact.  Cardiovascular:     Rate and Rhythm: Normal rate and regular rhythm.  Pulmonary:     Effort: Pulmonary effort is normal. No respiratory distress.  Abdominal:     General: Abdomen is flat. Bowel sounds are normal. There is no distension.     Palpations: Abdomen is soft. There is no mass.     Tenderness: There is no abdominal tenderness. There is no guarding or rebound.     Hernia: No hernia is present.  Musculoskeletal:        General: No swelling. Normal range of motion.     Cervical back: Normal range of motion and neck supple.  Skin:    General: Skin is warm and dry.     Coloration: Skin is jaundiced.  Neurological:     General: No focal deficit present.     Mental Status: He is alert and oriented to person, place, and time.  Psychiatric:        Mood and Affect: Mood normal.        Behavior: Behavior normal.        Thought Content: Thought content normal.        Judgment: Judgment normal.    Vital signs: Vitals:   11/25/21 0500 11/25/21 0600  BP: (!) 128/91 115/83  Pulse:    Resp: 20 17  Temp:    SpO2:     Last BM Date :  (pta)    GI:  Lab Results: Recent Labs    11/23/21 0316 11/24/21 1031 11/25/21 0640  WBC 9.5 9.6 10.1  HGB 13.4 14.1 12.4*  HCT 37.9* 40.8 36.3*  PLT 131* 148* 151   BMET Recent Labs    11/23/21 0316 11/24/21 1031 11/25/21 0640  NA 135 136 138  K 3.3* 4.8 4.2  CL 97* 106 108  CO2 _0 GLUCOSE 92 134* 126*  BUN _1 CREATININE 0.61 0.64 0.50*  CALCIUM 8.7* 8.4* 8.6*   LFT Recent Labs    11/25/21 0640  PROT  6.2*  ALBUMIN 2.6*  AST 173*  ALT 47*  ALKPHOS 125  BILITOT 16.3*   PT/INR Recent Labs    11/24/21 1031 11/25/21 0640  LABPROT 19.8* 18.7*  INR 1.7* 1.6*  Studies/Results: DG CHEST PORT 1 VIEW  Result Date: 11/24/2021 CLINICAL DATA:  Alcohol dependence with withdrawal with complication. EXAM: PORTABLE CHEST 1 VIEW COMPARISON:  09/29/2021 FINDINGS: Decreased lung volumes are seen. Bibasilar atelectasis is present. Small bilateral pleural effusions cannot be excluded. IMPRESSION: Decreased lung volumes with bibasilar atelectasis. Electronically Signed   By: Marlaine Hind M.D.   On: 11/24/2021 12:31    Impression: Alcoholic hepatitis - CT abdomen/pelvis with contrast 5/27: showed gallbladder sludge without evidence of cholelithiasis or cholecystitis.  No biliary ductal dilation. Stable severe hepatic steatosis and stable spontaneous portacaval shunt noted. - AST 173 (217)/ ALT 47 (55)/ Alk phos 125 (134) - T bili 16.3 (19.7) - BUN 16, Cr. 0.50 - hgb 12.4 - no leukocytosis - Hep B surface antigen, Hep B IgM, Hep A IgM negative - HCV ab reactive, HCV RNA quant pending - ethanol 29 - INR 1.6 - DF score: 47.1 - Started on prednisolone 40 Mg once daily 11/23/2021  Plan: On day 2 of prednisolone 59m daily. DF 47.1 T. Bili is improving. Continue prednisolone for 28 days. Will continue to monitor bilirubin and INR. Continue alcohol withdrawal protocol Can consider Vit K 174m Continue supportive care [facility] will follow.    LOS: 3 days   Tonica Brasington M Radford PaxPA-C 11/25/2021, 7:30 AM  Contact #  [phone]

## 2021-11-25 NOTE — Progress Notes (Signed)
PROGRESS NOTE    Larry Adams  [id number] DOB: 07/08/1985 DOA: 11/22/2021 PCP: Patient, No Pcp Per (Inactive)    Brief Narrative:  36 y/o male admitted with persistent nausea and vomiting, found to have alcoholic hepatitis and hyperbilirubinemia. He developed alcohol withdrawal and was admitted to stepdown necessitating precedex infusion.   Assessment & Plan:   Principal Problem:   Alcohol dependence with withdrawal with complication [facility]) Active Problems:   Alcoholic hepatitis without ascites   Hyperbilirubinemia   Hypokalemia   Hypomagnesemia   Tobacco use   Anxiety and depression   Alcohol withdrawal with delirium tremens -Last drink of ETOH 5/27 -Initially placed on CIWA protocol with Ativan -Due to persistent agitation, he was placed on Precedex infusion -PCCM following -also on librium taper -predecex has since been weaned off -Continue supportive care  Alcoholic hepatitis with hyperbilirubinemia -T. bili is 19.4 on admission is acutely elevated (bilirubin was 2.3 in 09/2021) -CT abdomen did not show any obstructive biliary process -Viral hepatitis panel shows Hep C ab positive, HCV RNA in process -Suspect this is related to alcohol -Discriminant function elevated at 47 on admission, now 41 -Case was discussed by EDP with GI, with recommendations to start on steroids.  Started on prednisolone -Continue to follow labs, T. Bili 16.3 today -formal GI consult requested  Urinary retention -possibly related to meds -in and out cath x 24 hours -foley placed 5/29 due to persistent retention -consider voiding trial in next 24 hours    Hypoxia -noted to require supplemental oxygen, currently on 5L -lungs clear on exam -chest xray without acute findings -?hypoventilation due to sedation -continue to follow  Hypokalemia/Hypomagnesemia -replace  Hyperammonemia -ammonia  74 on admission -lactulose increased to TID dosing   DVT prophylaxis: enoxaparin  (LOVENOX) injection 40 mg Start: 11/22/21 2030  Code Status: Full code Family Communication: updated patient father over the phone 5/30 Disposition Plan: Status is: Inpatient Remains inpatient appropriate because: Continued altered mental status extremities     Consultants:  PCCM  Procedures:    Antimicrobials:      Subjective: Sleeping, wakes up to voice. Says he is in church. Falls back asleep. No distress  Objective: Vitals:   11/25/21 0800 11/25/21 0805 11/25/21 0810 11/25/21 0815  BP:   118/81   Pulse:      Resp: 20 (!) 24 (!) 23 (!) 24  Temp:      TempSrc:      SpO2:      Weight:      Height:        Intake/Output Summary (Last 24 hours) at 11/25/2021 0907 Last data filed at 11/25/2021 0854 Gross per 24 hour  Intake 4675.5 ml  Output 810 ml  Net 3865.5 ml   Filed Weights   11/22/21 1410 11/22/21 2241  Weight: 127 kg 129.8 kg    Examination:  General exam: somnolent, briefly wakes up to voice Respiratory system: Clear to auscultation. Respiratory effort normal. Cardiovascular system:RRR. No murmurs, rubs, gallops. Gastrointestinal system: Abdomen is nondistended, soft and nontender. No organomegaly or masses felt. Normal bowel sounds heard. Central nervous system: No focal neurological deficits. Extremities: No C/C/E, +pedal pulses Skin: No rashes, lesions or ulcers Psychiatry: somnolent    Data Reviewed: I have personally reviewed following labs and imaging studies  CBC: Recent Labs  Lab 11/22/21 1454 11/23/21 0316 11/24/21 1031 11/25/21 0640  WBC 9.5 9.5 9.6 10.1  NEUTROABS 6.1  --   --   --   HGB 14.6 13.4 14.1 12.4*  HCT 40.7 37.9* 40.8 36.3*  MCV 95.3 95.5 99.5 100.0  PLT 139* 131* 148* [id number]   Basic Metabolic Panel: Recent Labs  Lab 11/22/21 1454 11/23/21 0316 11/24/21 1031 11/25/21 0640  NA 134* 135 136 138  K 3.3* 3.3* 4.8 4.2  CL 93* 97* 106 108  CO2 29 31 24 24   GLUCOSE 108* 92 134* 126*  BUN 9 8 16 16   CREATININE 0.70  0.61 0.64 0.50*  CALCIUM 8.7* 8.7* 8.4* 8.6*  MG 1.4* 1.7  --  2.0  PHOS  --  2.8  --   --    GFR: Estimated Creatinine Clearance: 180.4 mL/min (A) (by C-G formula based on SCr of 0.5 mg/dL (L)). Liver Function Tests: Recent Labs  Lab 11/22/21 1454 11/23/21 0316 11/24/21 1031 11/25/21 0640  AST 325* 257* 217* 173*  ALT 70* 57* 55* 47*  ALKPHOS 187* 160* 134* 125  BILITOT 19.4* 18.8* 19.7* 16.3*  PROT 7.7 6.6 6.3* 6.2*  ALBUMIN 2.9* 2.5* 2.1* 2.6*   No results for input(s): LIPASE, AMYLASE in the last 168 hours. Recent Labs  Lab 11/23/21 0522 11/24/21 1031  AMMONIA 74* 74*   Coagulation Profile: Recent Labs  Lab 11/22/21 1648 11/23/21 0316 11/24/21 1031 11/25/21 0640  INR 1.5* 1.5* 1.7* 1.6*   Cardiac Enzymes: Recent Labs  Lab 11/22/21 1454  CKTOTAL 189   BNP (last 3 results) No results for input(s): PROBNP in the last 8760 hours. HbA1C: No results for input(s): HGBA1C in the last 72 hours. CBG: No results for input(s): GLUCAP in the last 168 hours. Lipid Profile: No results for input(s): CHOL, HDL, LDLCALC, TRIG, CHOLHDL, LDLDIRECT in the last 72 hours. Thyroid Function Tests: No results for input(s): TSH, T4TOTAL, FREET4, T3FREE, THYROIDAB in the last 72 hours. Anemia Panel: No results for input(s): VITAMINB12, FOLATE, FERRITIN, TIBC, IRON, RETICCTPCT in the last 72 hours. Sepsis Labs: No results for input(s): PROCALCITON, LATICACIDVEN in the last 168 hours.  Recent Results (from the past 240 hour(s))  MRSA Next Gen by PCR, Nasal     Status: None   Collection Time: 11/22/21  9:22 PM   Specimen: Nasal Mucosa; Nasal Swab  Result Value Ref Range Status   MRSA by PCR Next Gen NOT DETECTED NOT DETECTED Final    Comment: (NOTE) The GeneXpert MRSA Assay (FDA approved for NASAL specimens only), is one component of a comprehensive MRSA colonization surveillance program. It is not intended to diagnose MRSA infection nor to guide or monitor treatment for MRSA  infections. Test performance is not FDA approved in patients less than 70 years old. Performed at [facility], [place] [address], [place], [place] [address]          Radiology Studies: DG CHEST PORT 1 VIEW  Result Date: 11/24/2021 CLINICAL DATA:  Alcohol dependence with withdrawal with complication. EXAM: PORTABLE CHEST 1 VIEW COMPARISON:  09/29/2021 FINDINGS: Decreased lung volumes are seen. Bibasilar atelectasis is present. Small bilateral pleural effusions cannot be excluded. IMPRESSION: Decreased lung volumes with bibasilar atelectasis. Electronically Signed   By: Marlaine Hind M.D.   On: 11/24/2021 12:31        Scheduled Meds:  chlordiazePOXIDE  25 mg Oral TID   Followed by   chlordiazePOXIDE  25 mg Oral BH-qamhs   Followed by   Derrill Memo ON 11/26/2021] chlordiazePOXIDE  25 mg Oral Daily   Chlorhexidine Gluconate Cloth  6 each Topical Daily   enoxaparin (LOVENOX) injection  40 mg Subcutaneous [id number]   folic acid  1 mg Oral Daily   lactulose  20 g Oral TID   mouth rinse  15 mL Mouth Rinse BID   multivitamin with minerals  1 tablet Oral Daily   nicotine  21 mg Transdermal Daily   prednisoLONE  40 mg Oral Daily   sodium chloride flush  3 mL Intravenous Q12H   thiamine  100 mg Oral Daily   Or   thiamine  100 mg Intravenous Daily   Continuous Infusions:  sodium chloride 100 mL/hr at 11/25/21 0903   dexmedetomidine (PRECEDEX) IV infusion Stopped (11/25/21 0851)     LOS: 3 days    Time spent: 49mins    Kathie Dike, MD [facility]   If 7PM-7AM, please contact night-coverage [email or web address]  11/25/2021, 9:07 AM

## 2021-11-26 DIAGNOSIS — F10239 Alcohol dependence with withdrawal, unspecified: Secondary | ICD-10-CM | POA: Diagnosis not present

## 2021-11-26 DIAGNOSIS — F10930 Alcohol use, unspecified with withdrawal, uncomplicated: Secondary | ICD-10-CM

## 2021-11-26 DIAGNOSIS — K7010 Alcoholic hepatitis without ascites: Secondary | ICD-10-CM | POA: Diagnosis not present

## 2021-11-26 LAB — CBC
HCT: 35.8 % — ABNORMAL LOW (ref 39.0–52.0)
Hemoglobin: 11.9 g/dL — ABNORMAL LOW (ref 13.0–17.0)
MCH: 33.8 pg (ref 26.0–34.0)
MCHC: 33.2 g/dL (ref 30.0–36.0)
MCV: 101.7 fL — ABNORMAL HIGH (ref 80.0–100.0)
Platelets: 163 10*3/uL (ref 150–400)
RBC: 3.52 MIL/uL — ABNORMAL LOW (ref 4.22–5.81)
RDW: 20.7 % — ABNORMAL HIGH (ref 11.5–15.5)
WBC: 11.4 10*3/uL — ABNORMAL HIGH (ref 4.0–10.5)
nRBC: 0.2 % (ref 0.0–0.2)

## 2021-11-26 LAB — COMPREHENSIVE METABOLIC PANEL
ALT: 53 U/L — ABNORMAL HIGH (ref 0–44)
AST: 175 U/L — ABNORMAL HIGH (ref 15–41)
Albumin: 2.3 g/dL — ABNORMAL LOW (ref 3.5–5.0)
Alkaline Phosphatase: 121 U/L (ref 38–126)
Anion gap: 5 (ref 5–15)
BUN: 15 mg/dL (ref 6–20)
CO2: 25 mmol/L (ref 22–32)
Calcium: 8.6 mg/dL — ABNORMAL LOW (ref 8.9–10.3)
Chloride: 107 mmol/L (ref 98–111)
Creatinine, Ser: 0.67 mg/dL (ref 0.61–1.24)
GFR, Estimated: >60 mL/min (ref >60)
Glucose, Bld: 107 mg/dL — ABNORMAL HIGH (ref 70–99)
Potassium: 4.2 mmol/L (ref 3.5–5.1)
Sodium: 137 mmol/L (ref 135–145)
Total Bilirubin: 15 mg/dL — ABNORMAL HIGH (ref 0.3–1.2)
Total Protein: 5.8 g/dL — ABNORMAL LOW (ref 6.5–8.1)

## 2021-11-26 LAB — AMMONIA: Ammonia: 33 umol/L (ref 9–35)

## 2021-11-26 LAB — PROTIME-INR
INR: 1.7 — ABNORMAL HIGH (ref 0.8–1.2)
Prothrombin Time: 20 seconds — ABNORMAL HIGH (ref 11.4–15.2)

## 2021-11-26 LAB — LIPASE, BLOOD: Lipase: 378 U/L — ABNORMAL HIGH (ref 11–51)

## 2021-11-26 MED ORDER — KETOROLAC TROMETHAMINE 30 MG/ML IJ SOLN
30.0000 mg | Freq: Four times a day (QID) | INTRAMUSCULAR | Status: DC | PRN
Start: 2021-11-26 — End: 2021-11-28
  Administered 2021-11-26 – 2021-11-27 (×6): 30 mg via INTRAVENOUS
  Filled 2021-11-26 (×6): qty 1

## 2021-11-26 MED ORDER — CALCIUM CARBONATE ANTACID 500 MG PO CHEW
1.0000 | CHEWABLE_TABLET | Freq: Three times a day (TID) | ORAL | Status: DC | PRN
  Administered 2021-11-27: 200 mg via ORAL
  Filled 2021-11-26: qty 1

## 2021-11-26 MED ORDER — LACTULOSE 10 GM/15ML PO SOLN
10.0000 g | Freq: Two times a day (BID) | ORAL | Status: DC
  Administered 2021-11-26 – 2021-12-05 (×12): 10 g via ORAL
  Filled 2021-11-26 (×14): qty 15

## 2021-11-26 MED ORDER — RIFAXIMIN 550 MG PO TABS
550.0000 mg | ORAL_TABLET | Freq: Two times a day (BID) | ORAL | Status: DC
  Administered 2021-11-26 – 2021-12-05 (×14): 550 mg via ORAL
  Filled 2021-11-26 (×24): qty 1

## 2021-11-26 NOTE — Progress Notes (Addendum)
[facility] Gastroenterology Progress Note  Larry Adams 36 y.o. [birth year]  CC:  Alcoholic hepatitis   Subjective: Patient states he feels better compared to yesterday. Tolerating full diet without difficulty. Denies nausea/vomiting. Reports abdominal pain when given "orange medicine." Much more alert compared to yesterday.  ROS : Review of Systems  Constitutional:  Negative for chills, fever and weight loss.  Gastrointestinal:  Negative for abdominal pain, blood in stool, constipation, diarrhea, heartburn, melena, nausea and vomiting.     Objective: Vital signs in last 24 hours: Vitals:   11/26/21 0730 11/26/21 0752  BP:    Pulse:  90  Resp:    Temp: 98.9 F (37.2 C)   SpO2:      Physical Exam:  General:  Alert, cooperative, no distress, jaundiced  Head:  Normocephalic, without obvious abnormality, atraumatic  Eyes:  icteric sclera, EOM's intact  Lungs:   Clear to auscultation bilaterally, respirations unlabored  Heart:  Regular rate and rhythm, S1, S2 normal  Abdomen:   Soft, non-tender, bowel sounds active all four quadrants,  no masses,     Lab Results: Recent Labs    11/25/21 0640 11/26/21 0304  NA 138 137  K 4.2 4.2  CL 108 107  CO2 24 25  GLUCOSE 126* 107*  BUN 16 15  CREATININE 0.50* 0.67  CALCIUM 8.6* 8.6*  MG 2.0  --    Recent Labs    11/25/21 0640 11/26/21 0304  AST 173* 175*  ALT 47* 53*  ALKPHOS 125 121  BILITOT 16.3* 15.0*  PROT 6.2* 5.8*  ALBUMIN 2.6* 2.3*   Recent Labs    11/25/21 0640 11/26/21 0304  WBC 10.1 11.4*  HGB 12.4* 11.9*  HCT 36.3* 35.8*  MCV 100.0 101.7*  PLT 151 163   Recent Labs    11/25/21 0640 11/26/21 0304  LABPROT 18.7* 20.0*  INR 1.6* 1.7*      Assessment Alcoholic hepatitis - CT abdomen/pelvis with contrast 5/27: showed gallbladder sludge without evidence of cholelithiasis or cholecystitis.  No biliary ductal dilation. Stable severe hepatic steatosis and stable spontaneous portacaval shunt noted. -  AST 175/ALT 53/alk phos 121, stable - T bili 15.0, trending down - BUN 15, creatinine 0.67 - hgb 111.9 - no leukocytosis - Hep B surface antigen, Hep B IgM, Hep A IgM negative - HCV ab reactive, HCV RNA not detected - ethanol 29 - INR 1.7 - DF score: 47.1 5/30 - Started on prednisolone 40 Mg once daily 11/23/2021   Plan: T. bili continuing to improve. Continue prednisolone for 28 days Continue supportive care and diet as tolerated [facility] will sign off. Please contact us if we can be of any further assistance during this hospital stay.   Garnette Scheuermann PA-C 11/26/2021, 9:26 AM  Contact #  [phone]

## 2021-11-26 NOTE — TOC Progression Note (Signed)
Transition of Care [facility]) - Progression Note    Patient Details  Name: Larry Adams MRN: [id number] Date of Birth: 01-Apr-1986  Transition of Care [facility]) CM/SW Contact  Lennart Pall, LCSW Phone Number: 11/26/2021, 10:49 AM  Clinical Narrative:    Met with pt today to discuss SA concerns and review resources. Pt lying in bed and appears groggy but is engaged in discussion.  Pt reports ongoing ETOH abuse and has been in IP and OP treatment programs in the past.  States, "I have no intention of going anywhere after this" with focus on returning to work ASAP due to financial concerns for himself and his father.  Pt reports that his father has been sober  x 3 yrs and is supportive of pt's sobriety.  Pt admits to panhandling, stealing, etc in order to get his alcohol and has tried to stop drinking on his own without success.  His primary concern is the withdrawal process and that is why he had come to the ER this time.  Encouraged him to focus on getting thru this detox process and his father's support for cessation now.  Pt is receptive to [facility] placing SA resource info on his AVS.   (Confirms he does have [employer] now through his job but has not established with a PCP - explained that [place] doesn't assist with this if pt has insurance coverage - he understands.) TOC will sign off at this time so please reorder if new needs arise.     Barriers to Discharge: Continued Medical Work up  Expected Discharge Plan and Services                           DME Arranged: N/A DME Agency: NA                   Social Determinants of Health (SDOH) Interventions    Readmission Risk Interventions    11/26/2021   10:47 AM  Readmission Risk Prevention Plan  Transportation Screening Complete  PCP or Specialist Appt within 3-5 Days Not Complete  Not Complete comments pt has [employer] and understands he needs to establish PCP on his own  [place] or [place] Complete  Social Work  Consult for [place] Planning/Counseling Complete  Palliative Care Screening Not Applicable  Medication Review Press photographer) Complete

## 2021-11-26 NOTE — Progress Notes (Signed)
Patient remains off precedex >24 hours.  PCCM will be available PRN.  Please call back if new needs arise.      Canary Brim, MSN, APRN, NP-C, AGACNP-BC [facility] 11/26/2021, 7:18 AM   Please see [email or web address] for pager details.   From 7A-7P if no response, please call [phone] After hours, please call ELink [phone]

## 2021-11-26 NOTE — Progress Notes (Signed)
  Progress Note   Patient: Larry Adams WCH:[id number] DOB: 1985/08/20 DOA: 11/22/2021     4 DOS: the patient was seen and examined on 11/26/2021   Brief hospital course: Larry Adams is a 36 y.o. male with medical history significant for severe alcohol use disorder with dependence, substance use disorder (heroin, benzodiazepines), hepatic steatosis, depression who is admitted with alcohol withdrawal with acute alcoholic hepatitis.  Assessment and Plan: Alcohol withdrawal with delirium tremens -Last drink of ETOH 5/27 -Initially placed on CIWA protocol with Ativan -Due to persistent agitation, he was placed on Precedex infusion, now weaned off -also on librium taper -Most recent CIWA 4 -Continue supportive care   Alcoholic hepatitis with hyperbilirubinemia -T. bili is 19.4 on admission is acutely elevated (bilirubin was 2.3 in 09/2021) -CT abdomen did not show any obstructive biliary process -Viral hepatitis panel shows Hep C ab positive, HCV RNA neg -Suspect this is related to alcohol -Discriminant function elevated at 47 on admission -Bili trending down -Pt had been on prednisolone .GI was consulted, recommended completing 28 days prednisolone   Urinary retention -possibly related to meds -foley placed 5/29 due to persistent retention -consider voiding trial in next 24 hours    Hypoxia -noted to require supplemental oxygen, currently on 5L -lungs clear on exam -chest xray without acute findings - currently on 3LNC -Wean O2 as tolerated   Hypokalemia/Hypomagnesemia -replaced   Hyperammonemia -ammonia  74 on admission -Continue lactulose as tolerated -Ammonia trending down       Subjective: Reports feeling better. Asking about going home  Physical Exam: Vitals:   11/26/21 0600 11/26/21 0700 11/26/21 0730 11/26/21 0752  BP: 116/72 134/67    Pulse: 91 92  90  Resp: (!) 29 (!) 28    Temp:   98.9 F (37.2 C)   TempSrc:   Oral   SpO2: 95% 91%    Weight:       Height:       General exam: Awake, laying in bed, in nad Respiratory system: Normal respiratory effort, no wheezing Cardiovascular system: regular rate, s1, s2 Gastrointestinal system: Soft, nondistended, positive BS Central nervous system: CN2-12 grossly intact, strength intact Extremities: Perfused, no clubbing Skin: Normal skin turgor, no notable skin lesions seen Psychiatry: Mood normal // no visual hallucinations   Data Reviewed:  Labs reviewed: bili 15.0, Cr -0.67  Family Communication: Pt in room, family not at bedside  Disposition: Status is: Inpatient Remains inpatient appropriate because: Severity of illness  Planned Discharge Destination: Home    Author: Rickey Barbara, MD 11/26/2021 11:32 AM  For on call review [email or web address].

## 2021-11-27 ENCOUNTER — Other Ambulatory Visit (HOSPITAL_COMMUNITY): Payer: Self-pay

## 2021-11-27 DIAGNOSIS — F419 Anxiety disorder, unspecified: Secondary | ICD-10-CM | POA: Diagnosis not present

## 2021-11-27 DIAGNOSIS — K7010 Alcoholic hepatitis without ascites: Secondary | ICD-10-CM | POA: Diagnosis not present

## 2021-11-27 DIAGNOSIS — F10239 Alcohol dependence with withdrawal, unspecified: Secondary | ICD-10-CM | POA: Diagnosis not present

## 2021-11-27 DIAGNOSIS — F10930 Alcohol use, unspecified with withdrawal, uncomplicated: Secondary | ICD-10-CM | POA: Diagnosis not present

## 2021-11-27 LAB — COMPREHENSIVE METABOLIC PANEL
ALT: 62 U/L — ABNORMAL HIGH (ref 0–44)
AST: 187 U/L — ABNORMAL HIGH (ref 15–41)
Albumin: 2.2 g/dL — ABNORMAL LOW (ref 3.5–5.0)
Alkaline Phosphatase: 122 U/L (ref 38–126)
Anion gap: 6 (ref 5–15)
BUN: 21 mg/dL — ABNORMAL HIGH (ref 6–20)
CO2: 24 mmol/L (ref 22–32)
Calcium: 8.6 mg/dL — ABNORMAL LOW (ref 8.9–10.3)
Chloride: 108 mmol/L (ref 98–111)
Creatinine, Ser: 0.95 mg/dL (ref 0.61–1.24)
GFR, Estimated: >60 mL/min (ref >60)
Glucose, Bld: 98 mg/dL (ref 70–99)
Potassium: 4.1 mmol/L (ref 3.5–5.1)
Sodium: 138 mmol/L (ref 135–145)
Total Bilirubin: 14.3 mg/dL — ABNORMAL HIGH (ref 0.3–1.2)
Total Protein: 5.9 g/dL — ABNORMAL LOW (ref 6.5–8.1)

## 2021-11-27 LAB — CBC
HCT: 35.2 % — ABNORMAL LOW (ref 39.0–52.0)
Hemoglobin: 11.9 g/dL — ABNORMAL LOW (ref 13.0–17.0)
MCH: 34.2 pg — ABNORMAL HIGH (ref 26.0–34.0)
MCHC: 33.8 g/dL (ref 30.0–36.0)
MCV: 101.1 fL — ABNORMAL HIGH (ref 80.0–100.0)
Platelets: 190 10*3/uL (ref 150–400)
RBC: 3.48 MIL/uL — ABNORMAL LOW (ref 4.22–5.81)
RDW: 21.9 % — ABNORMAL HIGH (ref 11.5–15.5)
WBC: 14.6 10*3/uL — ABNORMAL HIGH (ref 4.0–10.5)
nRBC: 0.1 % (ref 0.0–0.2)

## 2021-11-27 LAB — MAGNESIUM: Magnesium: 1.8 mg/dL (ref 1.7–2.4)

## 2021-11-27 MED ORDER — LORAZEPAM 2 MG/ML IJ SOLN
2.0000 mg | INTRAMUSCULAR | Status: AC
  Administered 2021-11-27: 2 mg via INTRAVENOUS
  Filled 2021-11-27: qty 1

## 2021-11-27 MED ORDER — CHLORDIAZEPOXIDE HCL 25 MG PO CAPS
25.0000 mg | ORAL_CAPSULE | Freq: Every day | ORAL | Status: DC
Start: 2021-11-28 — End: 2021-11-28

## 2021-11-27 MED ORDER — CHLORDIAZEPOXIDE HCL 5 MG PO CAPS
25.0000 mg | ORAL_CAPSULE | ORAL | Status: DC
Start: 2021-11-27 — End: 2021-11-27

## 2021-11-27 MED ORDER — CHLORDIAZEPOXIDE HCL 5 MG PO CAPS: 25.0000 mg | ORAL_CAPSULE | Freq: Every day | ORAL | Status: DC

## 2021-11-27 MED ORDER — LORAZEPAM 2 MG/ML IJ SOLN
4.0000 mg | INTRAMUSCULAR | Status: AC
  Administered 2021-11-27: 4 mg via INTRAVENOUS
  Filled 2021-11-27: qty 2

## 2021-11-27 MED ORDER — CHLORDIAZEPOXIDE HCL 25 MG PO CAPS
25.0000 mg | ORAL_CAPSULE | Freq: Two times a day (BID) | ORAL | Status: AC
Start: 2021-11-27 — End: 2021-11-27
  Administered 2021-11-27 (×2): 25 mg via ORAL
  Filled 2021-11-27: qty 1
  Filled 2021-11-27: qty 5

## 2021-11-27 MED ORDER — SODIUM CHLORIDE 0.9 % IV SOLN
260.0000 mg | INTRAVENOUS | Status: AC
  Administered 2021-11-27: 260 mg via INTRAVENOUS
  Filled 2021-11-27 (×2): qty 2

## 2021-11-27 MED ORDER — DEXMEDETOMIDINE HCL IN NACL 200 MCG/50ML IV SOLN
0.4000 ug/kg/h | INTRAVENOUS | Status: DC
  Administered 2021-11-27: 0.4 ug/kg/h via INTRAVENOUS
  Administered 2021-11-27 – 2021-11-28 (×2): 0.8 ug/kg/h via INTRAVENOUS
  Filled 2021-11-27 (×4): qty 50

## 2021-11-27 MED ORDER — LORAZEPAM 2 MG/ML IJ SOLN: 4.0000 mg | Freq: Once | INTRAMUSCULAR | Status: DC

## 2021-11-27 NOTE — Progress Notes (Addendum)
Patient is very restless and confused, believes that he is going home and will be picked up by his father.  Patient not following safety reminders, starts getting dressed.  Patient is unstable on legs and needs 1-2 person assist to move around for stability.  PRN meds given as ordered, Dr. Wyline Copas made aware by charge RN Jarrett Soho.

## 2021-11-27 NOTE — Progress Notes (Signed)
Patient seen by Drs. Rhona Leavens and [employer].  Will transfer patient to higher level of care.

## 2021-11-27 NOTE — Progress Notes (Addendum)
Per patient's request patient ambulated in hall with 2 staff members. Patient noticed to have disturbed gait and balance. No dizziness or lightheadedness reported by patient. RN noticed dyspnea with ambulation. Patient ambulated about 37ft and then returned to room. Patient sitting on edge of bed, bed alarm set, call bell in place, patient breathing returned to normal.

## 2021-11-27 NOTE — Progress Notes (Signed)
  Progress Note   Patient: Larry Adams PQZ:[id number] DOB: 05-22-1986 DOA: 11/22/2021     5 DOS: the patient was seen and examined on 11/27/2021   Brief hospital course: Larry Adams is a 36 y.o. male with medical history significant for severe alcohol use disorder with dependence, substance use disorder (heroin, benzodiazepines), hepatic steatosis, depression who is admitted with alcohol withdrawal with acute alcoholic hepatitis.  Assessment and Plan: Alcohol withdrawal with delirium tremens -Last drink of ETOH 5/27 -Initially placed on CIWA protocol with Ativan and Librium taper -Due to persistent agitation, he was placed on Precedex infusion, now initially weaned off -Today, became more confused with CIWA up to 16-17, not improved despite multiple doses of ativan, including additional doses of ativan over CIWA  -Given concerns of worsening withdrawals, had consulted PCCM to re-evaluate -Appreciate assistance by PCCM, pt to return to ICU. Question if pt would benefit from precedex in near future   Alcoholic hepatitis with hyperbilirubinemia -T. bili is 19.4 on admission is acutely elevated (bilirubin was 2.3 in 09/2021) -CT abdomen did not show any obstructive biliary process -Viral hepatitis panel shows Hep C ab positive, HCV RNA neg -Suspect this is related to alcohol -Discriminant function elevated at 47 on admission -Bili now trending down -Pt had been on prednisolone .GI was consulted, recommended completing 28 days prednisolone   Urinary retention -possibly related to meds -foley placed 5/29 due to persistent retention -Planned voiding trial    Hypoxia -noted to require supplemental oxygen, currently on 5L -lungs clear on exam -chest xray without acute findings - weaned to room air   Hypokalemia/Hypomagnesemia -replaced   Hyperammonemia -ammonia  74 on admission -Continue lactulose as tolerated -Ammonia had been trending down       Subjective: More agitated  this AM, Wanting to go home  Physical Exam: Vitals:   11/26/21 1823 11/26/21 1943 11/27/21 0307 11/27/21 1356  BP: 135/65 133/77 117/79 114/73  Pulse: (!) 102 100 91 89  Resp: (!) 33 20 20 18   Temp:  98.8 F (37.1 C) 98.7 F (37.1 C) 97.9 F (36.6 C)  TempSrc:    Oral  SpO2: 93% 96% 96%   Weight:      Height:       General exam: Conversant, in no acute distress Respiratory system: normal chest rise, clear, no audible wheezing Cardiovascular system: regular rhythm, s1-s2 Gastrointestinal system: Nondistended, nontender, pos BS Central nervous system: No seizures, no tremors Extremities: No cyanosis, no joint deformities Skin: No rashes, no pallor Psychiatry: confused, anxious   Data Reviewed:  Labs reviewed: K 4.1, WBC 14.6, Hgb 11.9  Family Communication: Pt in room, discussed with pt's father over phone  Disposition: Status is: Inpatient Remains inpatient appropriate because: Severity of illness  Planned Discharge Destination: Home    Author: , MD 11/27/2021 2:42 PM  For on call review [email or web address].

## 2021-11-27 NOTE — Progress Notes (Signed)
   NAME:  Larry Adams, MRN:  [id number], DOB:  March 28, 1986, LOS: 5 ADMISSION DATE:  11/22/2021, CONSULTATION DATE:  11/23/2021 REFERRING MD:  Pearletha Forge MD, CHIEF COMPLAINT: Alcohol withdrawal, requirement for Precedex drip  History of Present Illness:  36 year old M with history of alcohol dependence admitted 5/27 with vomiting, concern for alcohol withdrawal.  Work up consistent with acute alcoholic hepatitis, hyperbilirubinemia. He developed acute withdrawal, DTs. Admitted to stepdown unit and started on Precedex infusion.  Pertinent  Medical History  Anxiety  Depression  Back Pain  Drug Overdose  Mental Disorder    Significant Hospital Events: Including procedures, antibiotic start and stop dates in addition to other pertinent events   5/27 Admit with ETOH withdrawal  5/31 off precedex, to floor  Interim History / Subjective:  As librium tapered, pt developed increased agitation today requiring 10 mg IV ativan early this afternoon. Kept insisting he wanted to go home. Father said he planned to be out of town beginning tomorrow and was hopeful he'd stick around here to ride out his encephalopathy.  Objective   Blood pressure 114/73, pulse 89, temperature 97.9 F (36.6 C), temperature source Oral, resp. rate 18, height 6\' 1"  (1.854 m), weight 129.8 kg, SpO2 96 %.        Intake/Output Summary (Last 24 hours) at 11/27/2021 1358 Last data filed at 11/27/2021 1213 Gross per 24 hour  Intake 2264.13 ml  Output 450 ml  Net 1814.13 ml   Filed Weights   11/22/21 1410 11/22/21 2241  Weight: 127 kg 129.8 kg    Examination: General: nad alternately agitated and then sleepy once he arrived to ICU HEENT: MMM, PERRL, sclera icteric Neuro: sedate, awakens to voice, speech clear, follows commands, oriented to self, situation mostly CV: s1s2 RRR, no m/r/g PULM: normal WOB, lungs bilaterally clear GI: soft, NT, bsx4 active  Extremities: warm/dry, trace BLE pre-tibial pitting edema  Skin: no  rashes or lesions  Labs/imaging reviewed: LFTs improving WBC rising  Resolved Hospital Problem list     Assessment & Plan:   Acute toxic and metabolic encephalopathy due to Acute Alcohol Withdrawal with DTs, hyperammonemia -stop precedex infusion with sedation, follow clinical exam  -CIWA protocol  -trial phenobarb if CIWA indicates he needs another dose of ativan -wean precedex as able -diet as tolerated  -promote sleep / wake cycle  -delirium prevention measures  -continue thiamine, folate, MVI -lactulose TID   Acute Alcoholic Hepatitis Maddrey's 42.9 on admission  -continue prednisolone 40 mg QD , initiated in setting of high discriminant function  -may need to reconsider utility of treatment if complicating his  -trend LFT's   Best Practice (right click and "Reselect all SmartList Selections" daily)  Diet/type: Regular consistency (see orders) DVT prophylaxis: LMWH GI prophylaxis: N/A Lines: N/A Foley:  N/A Code Status:  full code Last date of multidisciplinary goals of care discussion [Will call father later today to update]  Critical care time: 35 minutes  [place]  11/27/2021, 1:58 PM

## 2021-11-27 NOTE — Progress Notes (Signed)
Patient has been anxious and agitated, pt states "I am irritated, I am waiting for my father to pick me up.  I want to go get beer".  Pt redirected and reoriented.  PRN meds given as ordered.

## 2021-11-28 ENCOUNTER — Inpatient Hospital Stay (HOSPITAL_COMMUNITY): Payer: Commercial Managed Care - HMO

## 2021-11-28 ENCOUNTER — Inpatient Hospital Stay: Payer: Self-pay

## 2021-11-28 DIAGNOSIS — G9340 Encephalopathy, unspecified: Secondary | ICD-10-CM

## 2021-11-28 DIAGNOSIS — F10239 Alcohol dependence with withdrawal, unspecified: Secondary | ICD-10-CM | POA: Diagnosis not present

## 2021-11-28 LAB — COMPREHENSIVE METABOLIC PANEL
ALT: 78 U/L — ABNORMAL HIGH (ref 0–44)
AST: 207 U/L — ABNORMAL HIGH (ref 15–41)
Albumin: 2.2 g/dL — ABNORMAL LOW (ref 3.5–5.0)
Alkaline Phosphatase: 125 U/L (ref 38–126)
Anion gap: 4 — ABNORMAL LOW (ref 5–15)
BUN: 23 mg/dL — ABNORMAL HIGH (ref 6–20)
CO2: 21 mmol/L — ABNORMAL LOW (ref 22–32)
Calcium: 8.6 mg/dL — ABNORMAL LOW (ref 8.9–10.3)
Chloride: 111 mmol/L (ref 98–111)
Creatinine, Ser: 0.9 mg/dL (ref 0.61–1.24)
GFR, Estimated: >60 mL/min (ref >60)
Glucose, Bld: 123 mg/dL — ABNORMAL HIGH (ref 70–99)
Potassium: 4.2 mmol/L (ref 3.5–5.1)
Sodium: 136 mmol/L (ref 135–145)
Total Bilirubin: 13.2 mg/dL — ABNORMAL HIGH (ref 0.3–1.2)
Total Protein: 6 g/dL — ABNORMAL LOW (ref 6.5–8.1)

## 2021-11-28 LAB — PHOSPHORUS: Phosphorus: 3.5 mg/dL (ref 2.5–4.6)

## 2021-11-28 LAB — CBC
HCT: 35.3 % — ABNORMAL LOW (ref 39.0–52.0)
Hemoglobin: 12.1 g/dL — ABNORMAL LOW (ref 13.0–17.0)
MCH: 34.5 pg — ABNORMAL HIGH (ref 26.0–34.0)
MCHC: 34.3 g/dL (ref 30.0–36.0)
MCV: 100.6 fL — ABNORMAL HIGH (ref 80.0–100.0)
Platelets: 191 10*3/uL (ref 150–400)
RBC: 3.51 MIL/uL — ABNORMAL LOW (ref 4.22–5.81)
RDW: 22 % — ABNORMAL HIGH (ref 11.5–15.5)
WBC: 11.3 10*3/uL — ABNORMAL HIGH (ref 4.0–10.5)
nRBC: 0.4 % — ABNORMAL HIGH (ref 0.0–0.2)

## 2021-11-28 LAB — BLOOD GAS, ARTERIAL
Acid-base deficit: 2.3 mmol/L — ABNORMAL HIGH (ref 0.0–2.0)
Bicarbonate: 22.5 mmol/L (ref 20.0–28.0)
O2 Saturation: 97.4 %
Patient temperature: 37
pCO2 arterial: 38 mmHg (ref 32–48)
pH, Arterial: 7.38 (ref 7.35–7.45)
pO2, Arterial: 74 mmHg — ABNORMAL LOW (ref 83–108)

## 2021-11-28 LAB — MAGNESIUM: Magnesium: 2.1 mg/dL (ref 1.7–2.4)

## 2021-11-28 MED ORDER — FOLIC ACID 5 MG/ML IJ SOLN
1.0000 mg | Freq: Every day | INTRAMUSCULAR | Status: DC
  Administered 2021-11-28 – 2021-12-04 (×7): 1 mg via INTRAVENOUS
  Filled 2021-11-28 (×10): qty 0.2

## 2021-11-28 MED ORDER — PHENOBARBITAL SODIUM 130 MG/ML IJ SOLN
100.0000 mg | Freq: Three times a day (TID) | INTRAMUSCULAR | Status: AC
  Administered 2021-11-30 – 2021-12-02 (×6): 100 mg via INTRAVENOUS
  Filled 2021-11-28 (×6): qty 1

## 2021-11-28 MED ORDER — LORAZEPAM 2 MG/ML IJ SOLN
0.5000 mg | INTRAMUSCULAR | Status: DC | PRN
  Administered 2021-11-28 – 2021-11-30 (×3): 2 mg via INTRAVENOUS
  Filled 2021-11-28 (×4): qty 1

## 2021-11-28 MED ORDER — PHENOBARBITAL SODIUM 65 MG/ML IJ SOLN: 65.0000 mg | Freq: Three times a day (TID) | INTRAMUSCULAR | Status: DC

## 2021-11-28 MED ORDER — PHENOBARBITAL SODIUM 130 MG/ML IJ SOLN
130.0000 mg | Freq: Once | INTRAMUSCULAR | Status: AC
  Administered 2021-11-28: 130 mg via INTRAVENOUS
  Filled 2021-11-28: qty 1

## 2021-11-28 MED ORDER — PHENOBARBITAL SODIUM 130 MG/ML IJ SOLN
130.0000 mg | Freq: Three times a day (TID) | INTRAMUSCULAR | Status: AC
  Administered 2021-11-28 – 2021-11-30 (×6): 130 mg via INTRAVENOUS
  Filled 2021-11-28 (×6): qty 1

## 2021-11-28 MED ORDER — SODIUM CHLORIDE 0.9 % IV SOLN: INTRAVENOUS | Status: DC | PRN

## 2021-11-28 MED ORDER — SODIUM CHLORIDE 0.9 % IV SOLN
260.0000 mg | Freq: Once | INTRAVENOUS | Status: AC
  Administered 2021-11-28: 260 mg via INTRAVENOUS
  Filled 2021-11-28: qty 2

## 2021-11-28 MED ORDER — FUROSEMIDE 10 MG/ML IJ SOLN
40.0000 mg | Freq: Once | INTRAMUSCULAR | Status: AC
  Administered 2021-11-28: 40 mg via INTRAVENOUS
  Filled 2021-11-28: qty 4

## 2021-11-28 MED ORDER — SODIUM CHLORIDE 0.9 % IV SOLN
260.0000 mg | Freq: Once | INTRAVENOUS | Status: DC
  Filled 2021-11-28: qty 2

## 2021-11-28 MED ORDER — LORAZEPAM 2 MG/ML IJ SOLN
1.0000 mg | INTRAMUSCULAR | Status: DC | PRN
  Administered 2021-11-28: 1 mg via INTRAVENOUS
  Filled 2021-11-28: qty 1

## 2021-11-28 MED ORDER — DEXMEDETOMIDINE HCL IN NACL 400 MCG/100ML IV SOLN
0.4000 ug/kg/h | INTRAVENOUS | Status: DC
  Administered 2021-11-28: 0.4 ug/kg/h via INTRAVENOUS
  Administered 2021-11-28: 0.9 ug/kg/h via INTRAVENOUS
  Administered 2021-11-29 (×2): 1.2 ug/kg/h via INTRAVENOUS
  Administered 2021-11-29 (×2): 0.6 ug/kg/h via INTRAVENOUS
  Administered 2021-11-29 – 2021-11-30 (×11): 1.2 ug/kg/h via INTRAVENOUS
  Administered 2021-11-30: 1.1 ug/kg/h via INTRAVENOUS
  Administered 2021-11-30: 1.2 ug/kg/h via INTRAVENOUS
  Administered 2021-12-01: 0.8 ug/kg/h via INTRAVENOUS
  Administered 2021-12-01: 1 ug/kg/h via INTRAVENOUS
  Administered 2021-12-01: 1.1 ug/kg/h via INTRAVENOUS
  Administered 2021-12-01: 1 ug/kg/h via INTRAVENOUS
  Administered 2021-12-01: 1.1 ug/kg/h via INTRAVENOUS
  Administered 2021-12-02: 1 ug/kg/h via INTRAVENOUS
  Administered 2021-12-02: 0.8 ug/kg/h via INTRAVENOUS
  Administered 2021-12-02: 0.6 ug/kg/h via INTRAVENOUS
  Filled 2021-11-28 (×5): qty 100
  Filled 2021-11-28: qty 200
  Filled 2021-11-28 (×9): qty 100
  Filled 2021-11-28: qty 200
  Filled 2021-11-28 (×9): qty 100
  Filled 2021-11-28: qty 200

## 2021-11-28 MED ORDER — PHENOBARBITAL SODIUM 65 MG/ML IJ SOLN
65.0000 mg | Freq: Three times a day (TID) | INTRAMUSCULAR | Status: DC
  Administered 2021-12-02 – 2021-12-03 (×3): 65 mg via INTRAVENOUS
  Filled 2021-11-28 (×3): qty 1

## 2021-11-28 MED ORDER — LORAZEPAM 2 MG/ML IJ SOLN
2.0000 mg | Freq: Once | INTRAMUSCULAR | Status: AC
  Administered 2021-11-28: 2 mg via INTRAVENOUS

## 2021-11-28 MED ORDER — PHENOBARBITAL SODIUM 65 MG/ML IJ SOLN: 32.5000 mg | Freq: Three times a day (TID) | INTRAMUSCULAR | Status: DC

## 2021-11-28 MED ORDER — PHENOBARBITAL SODIUM 130 MG/ML IJ SOLN: 97.5000 mg | Freq: Three times a day (TID) | INTRAMUSCULAR | Status: DC

## 2021-11-28 MED ORDER — DEXTROSE IN LACTATED RINGERS 5 % IV SOLN
INTRAVENOUS | Status: DC
Start: 2021-11-28 — End: 2021-12-06

## 2021-11-28 NOTE — Progress Notes (Addendum)
Attempted to place PICC.  Able to cannulated vein and thread PICC but patient became very agitated sitting up in bed and pulling at sterile drape.  Attempted to calm patient with the assistance of Lazarus Gowda, RN without success.  Patient is pinching and hitting both this nurse and Renee.  Will not respond to our requests to stop.  Attempted to call bedside nurse.  At that point patient had pulled the drape off and pulled the PICC out.  Bedside nurse as well as other staff at bedside to secure patient.  Hemostasis achieved and pressure dressing on site.  IV team is unable to attempt another PICC on patient at this time.  Marijean Niemann, RN to notify MD.

## 2021-11-28 NOTE — Progress Notes (Addendum)
NAME:  Larry Adams, MRN:  [id number], DOB:  01-31-1986, LOS: 6 ADMISSION DATE:  11/22/2021, CONSULTATION DATE:  11/23/2021 REFERRING MD:  Pearletha Forge MD, CHIEF COMPLAINT: Alcohol withdrawal, requirement for Precedex drip  HBRIEF  36 year old M with history of alcohol dependence admitted 5/27 with vomiting, concern for alcohol withdrawal.  Work up consistent with acute alcoholic hepatitis, hyperbilirubinemia. He developed acute withdrawal, DTs. Admitted to stepdown unit and started on Precedex infusion.  Pertinent  Medical History  Anxiety  Depression  Back Pain  Drug Overdose  Mental Disorder    Significant Hospital Events: Including procedures, antibiotic start and stop dates in addition to other pertinent events   5/27 Admit with ETOH withdrawal  5/31 off precedex, to floor 6/1 - As librium tapered, pt developed increased agitation today requiring 10 mg IV ativan early this afternoon. Kept insisting he wanted to go home. Father said he planned to be out of town beginning tomorrow and was hopeful he'd stick around here to ride out his encephalopathy.  Interim History / Subjective:    6/2 - remains on precedex gtt. Bilirubin improving. AST/ALT worse. INR not checked. Afebrile  Objective   Blood pressure (!) 130/96, pulse 66, temperature 97.6 F (36.4 C), temperature source Axillary, resp. rate 20, height 6\' 1"  (1.854 m), weight 129.8 kg, SpO2 93 %.        Intake/Output Summary (Last 24 hours) at 11/28/2021 0808 Last data filed at 11/28/2021 0649 Gross per 24 hour  Intake 1492.72 ml  Output 1725 ml  Net -232.28 ml   Filed Weights   11/22/21 1410 11/22/21 2241  Weight: 127 kg 129.8 kg    Examination: General Appearance:  JAUNDICED. Sleepy but arousable Head:  Normocephalic, without obvious abnormality, atraumatic Eyes:  PERRL - yes, conjunctiva/corneas - JAUNDICED     Ears:  Normal external ear canals, both ears Nose:  G tube - no but has [place] + Throat:  ETT TUBE - no , OG  tube - no Neck:  Supple,  No enlargement/tenderness/nodules Lungs: Clear to auscultation bilaterally,  Heart:  S1 and S2 normal, no murmur, CVP -x  Pressors - none Abdomen:  Soft, no masses, no organomegaly Genitalia / Rectal:  Not done Extremities:  Extremities- intact Skin:  ntact in exposed areas . Sacral area - not examined Neurologic:  Sedation - precedex gtt -> RASS - -2-3 . Moves all 4s - yes. CAM-ICU - NA . Orientation - not     Resolved Hospital Problem list     Assessment & Plan:   Acute toxic and metabolic encephalopathy due to Acute Alcohol Withdrawal with DTs, hyperammonemia  11/28/21 - sleepy but arousable. On Precedx gtt   Plan -continue precedex infusion with sedation, follow clinical exam  -CIWA protocol  -trial phenobarb if CIWA indicates he needs another dose of ativan - will check with pharmacy -wean precedex as able -promote sleep / wake cycle  -delirium prevention measures  -continue thiamine, folate, MVI -lactulose TID and Xifaxan - check  Ammonia  Acute Alcoholic Hepatitis - Maddrey's 42.9 on admission   6/2 - improved jaundice but worsening enzymes. No INR avail  Plan -continue prednisolone 40 mg QD , initiated in setting of high discriminant function  -may need to reconsider utility of treatment if complicating his  -trend LFT's  and INR - check ammonia - check RUQ [place]  - might need to check Hep Virus panel   Anemia of critical illness  6/2 - hgb 12s. No bleeding  Plan  - -  PRBC for hgb </= 6.9gm%    - exceptions are   -  if ACS susepcted/confirmed then transfuse for hgb </= 8.0gm%,  or    -  active bleeding with hemodynamic instability, then transfuse regardless of hemoglobin value   At at all times try to transfuse 1 unit prbc as possible with exception of active hemorrhage   Best Practice (right click and "Reselect all SmartList Selections" daily)  Diet/type: Regular consistency (see orders) -> change to NPO except meds DVT  prophylaxis: LMWH GI prophylaxis: N/A Lines: N/A Foley:  N/A Code Status:  full code Last date of multidisciplinary goals of care discussion - father updated 11/28/21     ATTESTATION & SIGNATURE   The patient Larry Adams is critically ill with multiple organ systems failure and requires high complexity decision making for assessment and support, frequent evaluation and titration of therapies, application of advanced monitoring technologies and extensive interpretation of multiple databases.   Critical Care Time devoted to patient care services described in this note is  40  Minutes. This time reflects time of care of this signee Dr Brand Males. This critical care time does not reflect procedure time, or teaching time or supervisory time of PA/NP/Med student/Med Resident etc but could involve care discussion time     Dr. Brand Males, M.D., [facility].C.P Pulmonary and Critical Care Medicine Medical Director - [facility] ICU Staff Physician, [place] [facility] Pager: [phone], If no answer or between  15:00h - 7:00h: call [phone]  11/28/2021 8:08 AM     LABS    PULMONARY No results for input(s): PHART, PCO2ART, PO2ART, HCO3, TCO2, O2SAT in the last 168 hours.  Invalid input(s): PCO2, PO2  CBC Recent Labs  Lab 11/26/21 0304 11/27/21 0544 11/28/21 0252  HGB 11.9* 11.9* 12.1*  HCT 35.8* 35.2* 35.3*  WBC 11.4* 14.6* 11.3*  PLT 163 190 191    COAGULATION Recent Labs  Lab 11/22/21 1648 11/23/21 0316 11/24/21 1031 11/25/21 0640 11/26/21 0304  INR 1.5* 1.5* 1.7* 1.6* 1.7*    CARDIAC  No results for input(s): TROPONINI in the last 168 hours. No results for input(s): PROBNP in the last 168 hours.   CHEMISTRY Recent Labs  Lab 11/22/21 1454 11/23/21 0316 11/24/21 1031 11/25/21 0640 11/26/21 0304 11/27/21 0544 11/28/21 0252  NA 134* 135 136 138 137 138 136  K 3.3* 3.3* 4.8 4.2 4.2 4.1 4.2  CL  93* 97* 106 108 107 108 111  CO2 29 31 24 24 25 24  21*  GLUCOSE 108* 92 134* 126* 107* 98 123*  BUN 9 8 16 16 15  21* 23*  CREATININE 0.70 0.61 0.64 0.50* 0.67 0.95 0.90  CALCIUM 8.7* 8.7* 8.4* 8.6* 8.6* 8.6* 8.6*  MG 1.4* 1.7  --  2.0  --  1.8 2.1  PHOS  --  2.8  --   --   --   --  3.5   Estimated Creatinine Clearance: 160.3 mL/min (by C-G formula based on SCr of 0.9 mg/dL).   LIVER Recent Labs  Lab 11/22/21 1648 11/23/21 0316 11/24/21 1031 11/25/21 0640 11/26/21 0304 11/27/21 0544 11/28/21 0252  AST  --  257* 217* 173* 175* 187* 207*  ALT  --  57* 55* 47* 53* 62* 78*  ALKPHOS  --  160* 134* 125 121 122 125  BILITOT  --  18.8* 19.7* 16.3* 15.0* 14.3* 13.2*  PROT  --  6.6 6.3* 6.2* 5.8*  5.9* 6.0*  ALBUMIN  --  2.5* 2.1* 2.6* 2.3* 2.2* 2.2*  INR 1.5* 1.5* 1.7* 1.6* 1.7*  --   --      INFECTIOUS No results for input(s): LATICACIDVEN, PROCALCITON in the last 168 hours.   ENDOCRINE CBG (last 3)  No results for input(s): GLUCAP in the last 72 hours.       IMAGING x48h  - image(s) personally visualized  -   highlighted in bold No results found.

## 2021-11-28 NOTE — Progress Notes (Signed)
Called by nursing staff. Pt agitated. In spite of additional ativan in addition to precedex infusion had to abort PICC attempt, On my arrival he was trying to get OOB. This is a challenging situation as seems either heavily sedated or agitated.  Plan Will give additional phenobarb dose.  Then increase dosing to 130mg  every 8 hrs; hopefully this will allow to taper the precedex to off and avoid additional benzo use.   [place] ACNP-BC [facility] Pulmonary/Critical Care Pager # [phone] OR # [phone] if no answer

## 2021-11-28 NOTE — Progress Notes (Addendum)
Telephone consent obtained from father Larry Adams, Larry Adams for PICC.

## 2021-11-28 NOTE — Progress Notes (Addendum)
eLink Physician-Brief Progress Note Patient Name: Larry Adams DOB: Mar 24, 1986 MRN: [id number]   Date of Service  11/28/2021  HPI/Events of Note  Increased O2 requirement to 6L Increased tachypnea Otherwise not in respiratory distress CFB since admission +7.9L. Labs reviewed.  eICU Interventions  Order STAT CXR>>vascular congestion, small pleural effusions Lasix 40 mg once given NPO except for sips and chips. Holding lactulose and rifaxamin     Intervention Category Intermediate Interventions: Respiratory distress - evaluation and management  Linley Moskal Rodman Pickle 11/28/2021, 8:16 PM

## 2021-11-29 ENCOUNTER — Inpatient Hospital Stay (HOSPITAL_COMMUNITY): Payer: Commercial Managed Care - HMO

## 2021-11-29 ENCOUNTER — Inpatient Hospital Stay: Payer: Self-pay

## 2021-11-29 DIAGNOSIS — F10239 Alcohol dependence with withdrawal, unspecified: Secondary | ICD-10-CM | POA: Diagnosis not present

## 2021-11-29 DIAGNOSIS — J9601 Acute respiratory failure with hypoxia: Secondary | ICD-10-CM | POA: Diagnosis not present

## 2021-11-29 DIAGNOSIS — G9340 Encephalopathy, unspecified: Secondary | ICD-10-CM | POA: Diagnosis not present

## 2021-11-29 DIAGNOSIS — R0603 Acute respiratory distress: Secondary | ICD-10-CM | POA: Diagnosis not present

## 2021-11-29 LAB — COMPREHENSIVE METABOLIC PANEL
ALT: 83 U/L — ABNORMAL HIGH (ref 0–44)
AST: 203 U/L — ABNORMAL HIGH (ref 15–41)
Albumin: 2 g/dL — ABNORMAL LOW (ref 3.5–5.0)
Alkaline Phosphatase: 117 U/L (ref 38–126)
Anion gap: 5 (ref 5–15)
BUN: 17 mg/dL (ref 6–20)
CO2: 24 mmol/L (ref 22–32)
Calcium: 8.3 mg/dL — ABNORMAL LOW (ref 8.9–10.3)
Chloride: 107 mmol/L (ref 98–111)
Creatinine, Ser: 0.76 mg/dL (ref 0.61–1.24)
GFR, Estimated: >60 mL/min (ref >60)
Glucose, Bld: 101 mg/dL — ABNORMAL HIGH (ref 70–99)
Potassium: 3.7 mmol/L (ref 3.5–5.1)
Sodium: 136 mmol/L (ref 135–145)
Total Bilirubin: 11.1 mg/dL — ABNORMAL HIGH (ref 0.3–1.2)
Total Protein: 5.8 g/dL — ABNORMAL LOW (ref 6.5–8.1)

## 2021-11-29 LAB — CBC
HCT: 35.3 % — ABNORMAL LOW (ref 39.0–52.0)
Hemoglobin: 11.8 g/dL — ABNORMAL LOW (ref 13.0–17.0)
MCH: 33.6 pg (ref 26.0–34.0)
MCHC: 33.4 g/dL (ref 30.0–36.0)
MCV: 100.6 fL — ABNORMAL HIGH (ref 80.0–100.0)
Platelets: 202 10*3/uL (ref 150–400)
RBC: 3.51 MIL/uL — ABNORMAL LOW (ref 4.22–5.81)
RDW: 21.5 % — ABNORMAL HIGH (ref 11.5–15.5)
WBC: 11.6 10*3/uL — ABNORMAL HIGH (ref 4.0–10.5)
nRBC: 0.3 % — ABNORMAL HIGH (ref 0.0–0.2)

## 2021-11-29 LAB — ECHOCARDIOGRAM COMPLETE
AR max vel: 2.09 cm2
AV Peak grad: 10.9 mmHg
Ao pk vel: 1.65 m/s
Area-P 1/2: 2.87 cm2
Calc EF: 43.6 %
Height: 73 in
S' Lateral: 4.8 cm
Single Plane A2C EF: 43.2 %
Single Plane A4C EF: 43.7 %
Weight: 4578.51 oz

## 2021-11-29 LAB — PROCALCITONIN: Procalcitonin: 1.76 ng/mL

## 2021-11-29 LAB — PROTIME-INR
INR: 1.4 — ABNORMAL HIGH (ref 0.8–1.2)
Prothrombin Time: 17.5 seconds — ABNORMAL HIGH (ref 11.4–15.2)

## 2021-11-29 LAB — AMMONIA: Ammonia: 54 umol/L — ABNORMAL HIGH (ref 9–35)

## 2021-11-29 LAB — PHOSPHORUS: Phosphorus: 3.7 mg/dL (ref 2.5–4.6)

## 2021-11-29 LAB — LACTIC ACID, PLASMA: Lactic Acid, Venous: 0.8 mmol/L (ref 0.5–1.9)

## 2021-11-29 LAB — MAGNESIUM: Magnesium: 1.7 mg/dL (ref 1.7–2.4)

## 2021-11-29 MED ORDER — POTASSIUM CHLORIDE 20 MEQ PO PACK: 40.0000 meq | PACK | Freq: Every day | ORAL | Status: DC

## 2021-11-29 MED ORDER — METHYLPREDNISOLONE SODIUM SUCC 40 MG IJ SOLR
40.0000 mg | INTRAMUSCULAR | Status: DC
  Administered 2021-11-29 – 2021-12-01 (×3): 40 mg via INTRAVENOUS
  Filled 2021-11-29 (×3): qty 1

## 2021-11-29 MED ORDER — POTASSIUM CHLORIDE 10 MEQ/100ML IV SOLN
10.0000 meq | INTRAVENOUS | Status: AC
  Administered 2021-11-29 (×4): 10 meq via INTRAVENOUS
  Filled 2021-11-29 (×4): qty 100

## 2021-11-29 MED ORDER — CHLORHEXIDINE GLUCONATE 0.12 % MT SOLN
15.0000 mL | Freq: Two times a day (BID) | OROMUCOSAL | Status: DC
  Administered 2021-11-29 – 2021-12-04 (×10): 15 mL via OROMUCOSAL
  Filled 2021-11-29 (×8): qty 15

## 2021-11-29 MED ORDER — SODIUM CHLORIDE 0.9% FLUSH
10.0000 mL | Freq: Two times a day (BID) | INTRAVENOUS | Status: DC
  Administered 2021-11-29 – 2021-12-05 (×11): 10 mL

## 2021-11-29 MED ORDER — SODIUM CHLORIDE 0.9% FLUSH
10.0000 mL | INTRAVENOUS | Status: DC | PRN
  Administered 2021-11-29: 10 mL

## 2021-11-29 MED ORDER — ORAL CARE MOUTH RINSE
15.0000 mL | Freq: Two times a day (BID) | OROMUCOSAL | Status: DC
  Administered 2021-11-29 – 2021-12-05 (×10): 15 mL via OROMUCOSAL

## 2021-11-29 MED ORDER — PERFLUTREN LIPID MICROSPHERE
1.0000 mL | INTRAVENOUS | Status: AC | PRN
  Administered 2021-11-29: 2 mL via INTRAVENOUS

## 2021-11-29 MED ORDER — FUROSEMIDE 10 MG/ML IJ SOLN
40.0000 mg | Freq: Once | INTRAMUSCULAR | Status: AC
  Administered 2021-11-29: 40 mg via INTRAVENOUS
  Filled 2021-11-29: qty 4

## 2021-11-29 MED ORDER — MAGNESIUM SULFATE 2 GM/50ML IV SOLN
2.0000 g | Freq: Once | INTRAVENOUS | Status: AC
  Administered 2021-11-29: 2 g via INTRAVENOUS
  Filled 2021-11-29: qty 50

## 2021-11-29 NOTE — Progress Notes (Signed)
NAME:  Larry Adams, MRN:  [id number], DOB:  Nov 08, 1985, LOS: 7 ADMISSION DATE:  11/22/2021, CONSULTATION DATE:  11/23/2021 REFERRING MD:  Pearletha Forge MD, CHIEF COMPLAINT: Alcohol withdrawal, requirement for Precedex drip  BRIEF  36 year old M with history of alcohol dependence admitted 5/27 with vomiting, concern for alcohol withdrawal.  Work up consistent with acute alcoholic hepatitis, hyperbilirubinemia. He developed acute withdrawal, DTs. Admitted to stepdown unit and started on Precedex infusion.  Pertinent  Medical History  Anxiety  Depression  Back Pain  Drug Overdose  Mental Disorder    Significant Hospital Events: Including procedures, antibiotic start and stop dates in addition to other pertinent events   5/27 Admit with ETOH withdrawal  5/31 off precedex, to floor 6/1 - As librium tapered, pt developed increased agitation today requiring 10 mg IV ativan early this afternoon. Kept insisting he wanted to go home. Father said he planned to be out of town beginning tomorrow and was hopeful he'd stick around here to ride out his encephalopathy. +8L volume overload 6/2 - ains on precedex gtt. Bilirubin improving. AST/ALT worse. INR not checked. Afebrile. Phenobarb protcol added. Did not cooperate for PICC line  Interim History / Subjective:    6/3- LFT/Bili/INR all better. Remains in precedex and phenobarb protocol. More hypoxemic 6L [place]. And lasix given - >resp better per RN but still on 6L [place] and Pulse ox 95%.   Low grade temp 100.1 (new). Ammonia 54. RUQ [place] - fatty liver. No cirrhosis.  Improved volume overload to +4.8L  Objective   Blood pressure 110/63, pulse 67, temperature 98.2 F (36.8 C), temperature source Axillary, resp. rate (!) 22, height 6\' 1"  (1.854 m), weight 129.8 kg, SpO2 94 %.        Intake/Output Summary (Last 24 hours) at 11/29/2021 0844 Last data filed at 11/29/2021 0700 Gross per 24 hour  Intake 1701.97 ml  Output 4950 ml  Net -3248.03 ml   Filed  Weights   11/22/21 1410 11/22/21 2241  Weight: 127 kg 129.8 kg    Examination: General Appearance:  JAUNDICED. Sleepy but arousable Head:  Normocephalic, without obvious abnormality, atraumatic Eyes:  PERRL - yes, conjunctiva/corneas - JAUNDICED     Ears:  Normal external ear canals, both ears Nose:  G tube - no but has [place] + . On 6L [place] Throat:  ETT TUBE - no , OG tube - no Neck:  Supple,  No enlargement/tenderness/nodules Lungs: Clear to auscultation bilaterally,  Mildly labored but not paradoxical Heart:  S1 and S2 normal, no murmur, CVP -x  Pressors - none Abdomen:  Soft, no masses, no organomegaly Genitalia / Rectal:  Not done Extremities:  Extremities- intact Skin:  ntact in exposed areas . Sacral area - not examined Neurologic:  Sedation - precedex gtt with phenobarb and ativan protocols -> RASS - 3 . Moves all 4s - yes. CAM-ICU - NA . Orientation - not. Occ gets agitatied     [facility] Problem list     Assessment & Plan:   Acute toxic and metabolic encephalopathy due to Acute Alcohol Withdrawal with DTs, hyperammonemia  11/29/21 - sleepy but arousable. On Precedx gtt with phenobarb protocoo and ativan prn   Plan -continue precedex infusion with sedation, follow clinical exam  -CIWA protocol  -trial phenobarb if CIWA indicates he needs another dose of ativan - will check with pharmacy -wean precedex as able -promote sleep / wake cycle  -delirium prevention measures  -continue thiamine, folate, MVI (IV till panda placed) -lactulose  TID and Xifaxan (not gotten due to lack of panda but aim to restart)  Acute Alcoholic Hepatitis - Maddrey's 42.9 on admission   6/3 - improved jaundice , enz and INR  Plan -continue prednisolone 40 mg QD , initiated in setting of high discriminant function  -may need to reconsider utility of treatment if complicating his  -trend LFT's  and INR - might need to check Hep Virus panel   Anemia of critical illness  6/3 - hgb  11.8. No bleeding  Plan  - - PRBC for hgb </= 6.9gm%    - exceptions are   -  if ACS susepcted/confirmed then transfuse for hgb </= 8.0gm%,  or    -  active bleeding with hemodynamic instability, then transfuse regardless of hemoglobin value   At at all times try to transfuse 1 unit prbc as possible with exception of active hemorrhage  Acute respiratory failure - [place] o2 need  6/3 - worsened due to volume overload and resppndd to lasix. On 6L [place]  Plan  - repeat lasix  Volume overload - onset 11/24/21 with +4L. [place] +8.8L 11/27/21  6/3 - improved to +4.8L  Plan -r epeat lasix   SIRs with fever 11/29/21  Plan  -c heck [place]cal  - chc   Mild hypomag 11/29/21  Plan  - repleted by [place] (right click and "Reselect all SmartList Selections" daily)  Diet/type: Regular consistency (see orders) -> change to NPO except meds DVT prophylaxis: LMWH GI prophylaxis: N/A Lines: N/A Foley:  N/A Code Status:  full code Last date of multidisciplinary goals of care discussion - father updated 11/28/21 and again 11/29/21  Picc re-ordered Panda re-ordered    [place]   The patient Larry Adams is critically ill with multiple organ systems failure and requires high complexity decision making for assessment and support, frequent evaluation and titration of therapies, application of advanced monitoring technologies and extensive interpretation of multiple databases.   Critical Care Time devoted to patient care services described in this note is  35  Minutes. This time reflects time of care of this signee Dr Brand Males. This critical care time does not reflect procedure time, or teaching time or supervisory time of PA/NP/Med student/Med Resident etc but could involve care discussion time     Dr. Brand Males, M.D., [facility].C.P Pulmonary and Critical Care Medicine Medical Director - [facility] ICU Staff Physician, [place] [facility] Pager: [phone], If no answer or between  15:00h - 7:00h: call [phone]  11/29/2021 9:02 AM     LABS    PULMONARY Recent Labs  Lab 11/28/21 0900  PHART 7.38  PCO2ART 38  PO2ART 74*  HCO3 22.5  O2SAT 97.4    CBC Recent Labs  Lab 11/27/21 0544 11/28/21 0252 11/29/21 0307  HGB 11.9* 12.1* 11.8*  HCT 35.2* 35.3* 35.3*  WBC 14.6* 11.3* 11.6*  PLT 190 191 202    COAGULATION Recent Labs  Lab 11/23/21 0316 11/24/21 1031 11/25/21 0640 11/26/21 0304 11/29/21 0307  INR 1.5* 1.7* 1.6* 1.7* 1.4*    CARDIAC  No results for input(s): TROPONINI in the last 168 hours. No results for input(s): PROBNP in the last 168 hours.   CHEMISTRY Recent Labs  Lab 11/23/21 0316 11/24/21 1031 11/25/21 0640 11/26/21 0304 11/27/21 0544 11/28/21 0252 11/29/21 0307  NA 135   < > 138 137 138 136 136  K 3.3*   < >  4.2 4.2 4.1 4.2 3.7  CL 97*   < > 108 107 108 111 107  CO2 31   < > 24 25 24  21* 24  GLUCOSE 92   < > 126* 107* 98 123* 101*  BUN 8   < > 16 15 21* 23* 17  CREATININE 0.61   < > 0.50* 0.67 0.95 0.90 0.76  CALCIUM 8.7*   < > 8.6* 8.6* 8.6* 8.6* 8.3*  MG 1.7  --  2.0  --  1.8 2.1 1.7  PHOS 2.8  --   --   --   --  3.5 3.7   < > = values in this interval not displayed.   Estimated Creatinine Clearance: 180.4 mL/min (by C-G formula based on SCr of 0.76 mg/dL).   LIVER Recent Labs  Lab 11/23/21 0316 11/24/21 1031 11/25/21 0640 11/26/21 0304 11/27/21 0544 11/28/21 0252 11/29/21 0307  AST 257* 217* 173* 175* 187* 207* 203*  ALT 57* 55* 47* 53* 62* 78* 83*  ALKPHOS 160* 134* 125 121 122 125 117  BILITOT 18.8* 19.7* 16.3* 15.0* 14.3* 13.2* 11.1*  PROT 6.6 6.3* 6.2* 5.8* 5.9* 6.0* 5.8*  ALBUMIN 2.5* 2.1* 2.6* 2.3* 2.2* 2.2* 2.0*  INR 1.5* 1.7* 1.6* 1.7*  --   --  1.4*     INFECTIOUS Recent Labs  Lab 11/29/21 0307  LATICACIDVEN 0.8     ENDOCRINE CBG (last 3)  No results for input(s): GLUCAP in the last 72  hours.       IMAGING x48h  - image(s) personally visualized  -   highlighted in bold DG CHEST PORT 1 VIEW  Result Date: 11/28/2021 CLINICAL DATA:  Hypoxemia. EXAM: PORTABLE CHEST 1 VIEW COMPARISON:  11/24/2021 FINDINGS: The heart appears enlarged, possibly accentuated by portable AP technique and low lung volumes. Increasing patchy bibasilar atelectasis, possible small pleural effusions. Mild vascular congestion. No pneumothorax. IMPRESSION: 1. Low lung volumes with increasing bibasilar atelectasis and possible small pleural effusions. 2. Vascular congestion. Apparent cardiomegaly, possibly accentuated by portable AP technique and low lung volumes. Electronically Signed   By: Keith Rake M.D.   On: 11/28/2021 20:45   [place] EKG SITE RITE  Result Date: 11/28/2021 If Site Rite image not attached, placement could not be confirmed due to current cardiac rhythm.  US Abdomen Limited RUQ (LIVER/GB)  Result Date: 11/28/2021 CLINICAL DATA:  Jaundice EXAM: ULTRASOUND ABDOMEN LIMITED RIGHT UPPER QUADRANT COMPARISON:  CT abdomen and pelvis 11/22/2021 FINDINGS: Gallbladder: Dependent sludge. Upper normal gallbladder wall thickness. No discrete shadowing calculi. Patient able to position LEFT lateral decubitus for additional imaging. Common bile duct: Diameter: 7 mm, dilated for age Liver: Echogenic parenchyma consistent with marked fatty infiltration as seen on CT. No gross hepatic mass or nodularity identified though assessment of intrahepatic detail is degraded by sound attenuation. Portal vein is patent on color Doppler imaging. Turbulent flow within portal vein. Other: No RIGHT upper quadrant free fluid. IMPRESSION: Marked fatty infiltration of liver. Sludge within gallbladder with upper normal wall thickness but no discrete calculi. Electronically Signed   By: Lavonia Dana M.D.   On: 11/28/2021 10:28

## 2021-11-29 NOTE — Progress Notes (Signed)
eLink Physician-Brief Progress Note Patient Name: Larry Adams DOB: Jul 08, 1985 MRN: [id number]   Date of Service  11/29/2021  HPI/Events of Note  Hypomag 1.7  eICU Interventions  Replete     Intervention Category Minor Interventions: Electrolytes abnormality - evaluation and management  Tambra Muller Mechele Collin 11/29/2021, 4:21 AM

## 2021-11-29 NOTE — Plan of Care (Signed)
  Problem: Nutrition: Goal: Adequate nutrition will be maintained Outcome: Not Progressing   Problem: Safety: Goal: Non-violent Restraint(s) Outcome: Not Progressing

## 2021-11-29 NOTE — Progress Notes (Signed)
Attempted NG, pt now has on 4 point restraints and remains uncooperative. We tried giving 2mg  ativan and 87ml of phenobarbital prior to attempting ng. Pt remains physically and verbally abusive with staff. Hold off now per Dr. 0m.

## 2021-11-29 NOTE — Progress Notes (Signed)
Peripherally Inserted Central Catheter Placement  The IV Nurse has discussed with the patient and/or persons authorized to consent for the patient, the purpose of this procedure and the potential benefits and risks involved with this procedure.  The benefits include less needle sticks, lab draws from the catheter, and the patient may be discharged home with the catheter. Risks include, but not limited to, infection, bleeding, blood clot (thrombus formation), and puncture of an artery; nerve damage and irregular heartbeat and possibility to perform a PICC exchange if needed/ordered by physician.  Alternatives to this procedure were also discussed.  Bard Power PICC patient education guide, fact sheet on infection prevention and patient information card has been provided to patient /or left at bedside.  Consent obtained yesterday from father d/t agitation.  PICC Placement Documentation  PICC Double Lumen 11/29/21 Right Basilic 49 cm 3 cm (Active)  Indication for Insertion or Continuance of Line Vasoactive infusions;Administration of hyperosmolar/irritating solutions (i.e. TPN, Vancomycin, etc.);Prolonged intravenous therapies;Limited venous access - need for IV therapy >5 days (PICC only);Poor Vasculature-patient has had multiple peripheral attempts or PIVs lasting less than 24 hours 11/29/21 1807  Exposed Catheter (cm) 3 cm 11/29/21 1807  Site Assessment Clean, Dry, Intact 11/29/21 1807  Lumen #1 Status Flushed;Saline locked;Blood return noted 11/29/21 1807  Lumen #2 Status Flushed;Saline locked;Blood return noted 11/29/21 1807  Dressing Type Transparent;Securing device 11/29/21 1807  Dressing Status Antimicrobial disc in place;Clean, Dry, Intact 11/29/21 1807  Safety Lock Not Applicable 11/29/21 1807  Line Care Connections checked and tightened 11/29/21 1807  Line Adjustment (NICU/IV Team Only) No 11/29/21 1807  Dressing Intervention New dressing 11/29/21 1807  Dressing Change Due 12/06/21 11/29/21  1807       Elliot Dally 11/29/2021, 6:07 PM

## 2021-11-29 NOTE — Progress Notes (Signed)
Pt agitated during procedure, cursing, coming out of bed and thrashing.  Able to maintain sterile field and decrease agitation.  PICC insertion successful.

## 2021-11-30 ENCOUNTER — Inpatient Hospital Stay (HOSPITAL_COMMUNITY): Payer: Commercial Managed Care - HMO

## 2021-11-30 DIAGNOSIS — G9340 Encephalopathy, unspecified: Secondary | ICD-10-CM | POA: Diagnosis not present

## 2021-11-30 DIAGNOSIS — F10239 Alcohol dependence with withdrawal, unspecified: Secondary | ICD-10-CM | POA: Diagnosis not present

## 2021-11-30 LAB — COMPREHENSIVE METABOLIC PANEL
ALT: 94 U/L — ABNORMAL HIGH (ref 0–44)
AST: 224 U/L — ABNORMAL HIGH (ref 15–41)
Albumin: 2 g/dL — ABNORMAL LOW (ref 3.5–5.0)
Alkaline Phosphatase: 126 U/L (ref 38–126)
Anion gap: 8 (ref 5–15)
BUN: 17 mg/dL (ref 6–20)
CO2: 23 mmol/L (ref 22–32)
Calcium: 8.4 mg/dL — ABNORMAL LOW (ref 8.9–10.3)
Chloride: 107 mmol/L (ref 98–111)
Creatinine, Ser: 0.71 mg/dL (ref 0.61–1.24)
GFR, Estimated: >60 mL/min (ref >60)
Glucose, Bld: 120 mg/dL — ABNORMAL HIGH (ref 70–99)
Potassium: 4.2 mmol/L (ref 3.5–5.1)
Sodium: 138 mmol/L (ref 135–145)
Total Bilirubin: 11.2 mg/dL — ABNORMAL HIGH (ref 0.3–1.2)
Total Protein: 6.2 g/dL — ABNORMAL LOW (ref 6.5–8.1)

## 2021-11-30 LAB — PHOSPHORUS: Phosphorus: 4 mg/dL (ref 2.5–4.6)

## 2021-11-30 LAB — CBC
HCT: 39.4 % (ref 39.0–52.0)
Hemoglobin: 13.1 g/dL (ref 13.0–17.0)
MCH: 33.9 pg (ref 26.0–34.0)
MCHC: 33.2 g/dL (ref 30.0–36.0)
MCV: 102.1 fL — ABNORMAL HIGH (ref 80.0–100.0)
Platelets: 207 10*3/uL (ref 150–400)
RBC: 3.86 MIL/uL — ABNORMAL LOW (ref 4.22–5.81)
RDW: 21.1 % — ABNORMAL HIGH (ref 11.5–15.5)
WBC: 13.9 10*3/uL — ABNORMAL HIGH (ref 4.0–10.5)
nRBC: 0 % (ref 0.0–0.2)

## 2021-11-30 LAB — MAGNESIUM: Magnesium: 2 mg/dL (ref 1.7–2.4)

## 2021-11-30 LAB — PROTIME-INR
INR: 1.4 — ABNORMAL HIGH (ref 0.8–1.2)
Prothrombin Time: 17.2 seconds — ABNORMAL HIGH (ref 11.4–15.2)

## 2021-11-30 LAB — PROCALCITONIN: Procalcitonin: 1.33 ng/mL

## 2021-11-30 LAB — LACTIC ACID, PLASMA: Lactic Acid, Venous: 1 mmol/L (ref 0.5–1.9)

## 2021-11-30 MED ORDER — SODIUM CHLORIDE 0.9 % IV SOLN
2.0000 g | Freq: Three times a day (TID) | INTRAVENOUS | Status: AC
  Administered 2021-11-30 – 2021-12-04 (×15): 2 g via INTRAVENOUS
  Filled 2021-11-30 (×15): qty 12.5

## 2021-11-30 MED ORDER — MIDAZOLAM HCL 2 MG/2ML IJ SOLN
1.0000 mg | INTRAMUSCULAR | Status: DC | PRN
  Administered 2021-11-30 (×2): 2 mg via INTRAVENOUS
  Administered 2021-11-30 – 2021-12-01 (×2): 4 mg via INTRAVENOUS
  Administered 2021-12-01: 2 mg via INTRAVENOUS
  Filled 2021-11-30 (×2): qty 2
  Filled 2021-11-30 (×2): qty 4
  Filled 2021-11-30: qty 2

## 2021-11-30 MED ORDER — FUROSEMIDE 10 MG/ML IJ SOLN
40.0000 mg | Freq: Two times a day (BID) | INTRAMUSCULAR | Status: DC
  Administered 2021-11-30 (×2): 40 mg via INTRAVENOUS
  Filled 2021-11-30 (×2): qty 4

## 2021-11-30 MED ORDER — LACTULOSE ENEMA
300.0000 mL | Freq: Two times a day (BID) | RECTAL | Status: DC
Start: 2021-11-30 — End: 2021-12-04
  Administered 2021-11-30 – 2021-12-01 (×2): 300 mL via RECTAL
  Filled 2021-11-30 (×9): qty 300

## 2021-11-30 MED ORDER — FUROSEMIDE 10 MG/ML IJ SOLN
40.0000 mg | Freq: Once | INTRAMUSCULAR | Status: AC
  Administered 2021-11-30: 40 mg via INTRAVENOUS
  Filled 2021-11-30: qty 4

## 2021-11-30 MED ORDER — HALOPERIDOL LACTATE 5 MG/ML IJ SOLN
5.0000 mg | Freq: Four times a day (QID) | INTRAMUSCULAR | Status: DC
  Administered 2021-11-30 – 2021-12-04 (×17): 5 mg via INTRAVENOUS
  Filled 2021-11-30 (×17): qty 1

## 2021-11-30 MED ORDER — PHENOBARBITAL SODIUM 65 MG/ML IJ SOLN
65.0000 mg | Freq: Three times a day (TID) | INTRAMUSCULAR | Status: DC | PRN
Start: 2021-11-30 — End: 2021-12-03

## 2021-11-30 NOTE — Progress Notes (Signed)
Pharmacy Note regarding current medication list review for hepatic effects:  Dexmedetomidine Hydrochloride Abnormal liver function - Abnormal hepatic function and hyperbilirubinemia have been reported with postmarketing use  Phenobarbital Liver damage, abnormal liver function, hepatotoxicity Liver damage - Incidence: less than 1%.  Liver damage was reported in less than 1 in 100 patients (less than 1%) who received phenobarbital in a hospital setting. The incidence may be different in ambulatory patients. Abnormal liver function - Has been reported during postmarketing surveillance. Hepatotoxicity - Hepatitis has been reported as part of an anticonvulsant hypersensitivity syndrome; however, the hepatitis is usually mild. Symptoms may occur 3 weeks to 3 months after initiation of therapy. The anticonvulsant hypersensitivity syndrome is rare, but has been described in patients receiving phenytoin, carbamazepine, primidone, and phenobarbital [90]. Hepatitis or hepatic injury has been reported in 3 patients receiving phenobarbital  Enoxaparin Sodium Increased liver function test, Injury of liver Increased LFT - Incidence: 5.9% to 6.1%.   Asymptomatic increases in liver function tests (AST and ALT) greater than 3 times the ULN have been reported in up to 6.1% and 5.9%, respectively, of the patients treated with enoxaparin in clinical trials. These increases were reversible and rarely associated with increases in bilirubin.  Liver function test abnormalities have been reported in patients receiving enoxaparin. In a prospective, randomized study comparing the effects of enoxaparin and dextran-70 on liver function tests in 219 patients undergoing total hip replacement, abnormalities occurred in both groups compared to baseline. Dextran-70 induced significantly higher increases in AST, ALT, and alkaline phosphatase, while LDH and CK were significantly more elevated in patients receiving enoxaparin. Injury of  liver - Although frequency and causality have not been established, hepatocellular and cholestatic liver injury have been reported during postmarketing surveillance of enoxaparin.  Lorazepam Increased liver function test, Jaundice Increases in bilirubin, transaminases and alkaline phosphatases have been reported following treatment with benzodiazepines, including lorazepam [8]. Jaundice - Jaundice has been reported following treatment with benzodiazepines, including lorazepam.  Methylprednisolone - ALT/SGPT level raised, hepatotoxicity - possibly with higher doses   Selinda Eon, PharmD, BCPS Clinical Pharmacist [place] Please utilize Amion for appropriate phone number to reach the unit pharmacist [facility]) 11/30/2021 8:42 AM

## 2021-11-30 NOTE — Progress Notes (Signed)
Pharmacy Antibiotic Note  Larry Adams is a 36 y.o. male admitted on 11/22/2021 with vomiting, concern for alcohol withdrawal.  Today, Pharmacy has been consulted for cefepime dosing.  Plan: Cefepime 2 g IV every 8 hours Monitor clinical progress, renal function F/U C&S, abx deescalation / LOT   Height: 6\' 1"  (185.4 cm) Weight: 129.8 kg (286 lb 2.5 oz) IBW/kg (Calculated) : 79.9  Temp (24hrs), Avg:97.9 F (36.6 C), Min:96.9 F (36.1 C), Max:98.6 F (37 C)  Recent Labs  Lab 11/26/21 0304 11/27/21 0544 11/28/21 0252 11/29/21 0307 11/30/21 0305  WBC 11.4* 14.6* 11.3* 11.6* 13.9*  CREATININE 0.67 0.95 0.90 0.76 0.71  LATICACIDVEN  --   --   --  0.8 1.0    Estimated Creatinine Clearance: 180.4 mL/min (by C-G formula based on SCr of 0.71 mg/dL).    Allergies  Allergen Reactions   Morphine And Related Hives   Vancomycin Itching and Other (See Comments)    "Red man syndrome"     Antimicrobials this admission: 6/4 cefepime >>   Microbiology results: 5/27 MRSA PCR: not detected  Thank you for allowing pharmacy to be a part of this patient's care.  6/27, PharmD, BCPS Clinical Pharmacist  Please utilize Amion for appropriate phone number to reach the unit pharmacist [facility]) 11/30/2021 9:53 AM
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
## 2021-11-30 NOTE — Progress Notes (Addendum)
eLink Physician-Brief Progress Note Patient Name: Larry Adams DOB: March 14, 1986 MRN: [id number]   Date of Service  11/30/2021  HPI/Events of Note  Patient was oversedated this evening so phenobarb was held.   3:36 AM woke up and pulled NGT  Given ativan 2 mg  and more calm however requiring NRB for desaturations. Not following commands and on Precedex 1.2  SpO2 99%    eICU Interventions  Ok to give phenobarb Transition from NRB to [place] when able for goal SpO2 >90%  5:36 AM Now on 4 L nasal cannula.  Posey belt ordered for agitation    06:28 Lasix 40 mg ordered  Intervention Category Minor Interventions: Agitation / anxiety - evaluation and management  Danaiya Steadman Mechele Collin 11/30/2021, 3:35 AM

## 2021-11-30 NOTE — Progress Notes (Addendum)
NAME:  Larry Adams, MRN:  [id number], DOB:  February 17, 1986, LOS: 8 ADMISSION DATE:  11/22/2021, CONSULTATION DATE:  11/23/2021 REFERRING MD:  Pearletha Forge MD, CHIEF COMPLAINT: Alcohol withdrawal, requirement for Precedex drip  BRIEF  36 year old M with history of alcohol dependence admitted 5/27 with vomiting, concern for alcohol withdrawal.  Work up consistent with acute alcoholic hepatitis, hyperbilirubinemia. He developed acute withdrawal, DTs. Admitted to stepdown unit and started on Precedex infusion.  Pertinent  Medical History  Anxiety  Depression  Back Pain  Drug Overdose  Mental Disorder    Significant Hospital Events: Including procedures, antibiotic start and stop dates in addition to other pertinent events   5/27 Admit with ETOH withdrawal  5/31 off precedex, to floor 6/1 - As librium tapered, pt developed increased agitation today requiring 10 mg IV ativan early this afternoon. Kept insisting he wanted to go home. Father said he planned to be out of town beginning tomorrow and was hopeful he'd stick around here to ride out his encephalopathy. +8L volume overload 6/2 - ains on precedex gtt. Bilirubin improving. AST/ALT worse. INR not checked. Afebrile. Phenobarb protcol added. Did not cooperate for PICC line 6/3- LFT/Bili/INR all better. Remains in precedex and phenobarb protocol. More hypoxemic 6L [place]. And lasix given - >resp better per RN but still on 6L [place] and Pulse ox 95%.   Low grade temp 100.1 (new). Ammonia 54. RUQ [place] - fatty liver. No cirrhosis.   Improved volume overload to +4.8L  Interim History / Subjective:   6/4 - afebrile v hypotheermic. PCT > 1.0. WBC 14k.  Vol OL improved to 3L since admit. LFT stopped improving. Bili 11. On 4L [place]  (briefly on  face mask overnight after being agitated). CXR with LL atelectasis.  On/off severe agitation continues -> removed NG tube, unable to get PICC, unable to do TF, physically/verbally abusive with RN. Not getting  lactulose/xifaxan   Objective   Blood pressure 125/82, pulse 60, temperature (!) 96.9 F (36.1 C), temperature source Axillary, resp. rate 18, height 6\' 1"  (1.854 m), weight 129.8 kg, SpO2 95 %.        Intake/Output Summary (Last 24 hours) at 11/30/2021 0742 Last data filed at 11/30/2021 0645 Gross per 24 hour  Intake 1907.7 ml  Output 3750 ml  Net -1842.3 ml   Filed Weights   11/22/21 1410 11/22/21 2241  Weight: 127 kg 129.8 kg   General Appearance:  Looks criticall ill OBESE - + Head:  Normocephalic, without obvious abnormality, atraumatic Eyes:  PERRL - yes, conjunctiva/corneas - JAUNDICE     Ears:  Normal external ear canals, both ears Nose:  G tube - NO but has [place] Throat:  ETT TUBE - no , OG tube - no Neck:  Supple,  No enlargement/tenderness/nodules Lungs: Clear to auscultation bilaterally,  Mild tachypnea, 4L o2, pulse ox 95%, Not paradoxica Heart:  S1 and S2 normal, no murmur, CVP - no.  Pressors - no Abdomen:  Soft, no masses, no organomegaly Genitalia / Rectal:  Not done Extremities:  Extremities- intact with some edema.  Skin:  ntact in exposed areas . Sacral area - not examined. JAUNDICE Neurologic:  Sedation - precredex gtt, phenobarb scheduled, ativan prn -> RASS - -3 currently . Moves all 4s - yes when agitated. CAM-ICU - positive . Orientation - NOT        Resolved Hospital Problem list     Assessment & Plan:   Acute toxic and metabolic encephalopathy due to Acute Alcohol Withdrawal  with DTs, hyperammonemia  11/30/21 - sleepy but arousable. On Precedx gtt with phenobarb protocoo and ativan prn. Significant breakthrough agittation. Preventing PICC line placement, retention of panda   Plan - add scheduled  haldol 11/30/21 - -continue precedex infusion with sedation, follow clinical exam  - scheduled phenobarb protocol since 11/28/21 + ask pharmac to see safety in adding prn phenobarb  - prn phenobarbital and keeping total cumulative dose less than 20mg /kg  (using ideal body wt of 80kg) but up to 30mg /kg cumulative dose is max - change ativan prn to versed prn   - given raised LFT - -lactulose TID and Xifaxan (not gotten due to lack of panda but aim to restart)  - aim for lactulose enema 11/30/21 - -continue thiamine, folate, MVI (IV till panda placed) --wean precedex as able -promote sleep / wake cycle  -delirium prevention measures    Acute Alcoholic Hepatitis - Maddrey's 42.9 on admission  (RUQ [place] 11/28/21 with fatty liver only, no cirrhosis)  6/4 - improved jaundice , enz and INR through 11/29/21 and then flat  Plan -continue prednisolone 40 mg QD  -> chagned to solumedrol 11/29/21 (initiated in setting of high discriminant function ) -check hepatitis virus panel 12/01/21 - monitor for drug induced liver injury (all drugs he is currently on)  - change ativan to versed prn -trend LFT's  and INR -check Hep Virus panel 12/01/21   Anemia of critical illness  6/4 - hgb improved.  No bleeding. No transfusion  Plan  - - PRBC for hgb </= 6.9gm%    - exceptions are   -  if ACS susepcted/confirmed then transfuse for hgb </= 8.0gm%,  or    -  active bleeding with hemodynamic instability, then transfuse regardless of hemoglobin value   At at all times try to transfuse 1 unit prbc as possible with exception of active hemorrhage  Acute respiratory failure - [place] o2 need - first documented 11/28/21. Worsened to 6L [place] 11/29/21.  - etiology HCAP, volume ovreload and atelecatis  6/4 - improved to 4L [place]. Briefly on face mask after ativan obtundation. CXR suggests bilateral atx as etiology and volume overload. Concern for HCAP with fe  Plan  - repeat lasix 11/30/21 - and stay scheduled - o2 for pusle ox goal > 92%  - start ETCO2 monitoring 11/30/21  Volume overload - onset 11/24/21 with +4L. [place] +8.8L 11/27/21  6/3 - improved to +3L  Plan - lasix scheduled to continued   SIRs with fever 11/29/21. PCT > 1.0 and suggest sepsis/HCAP  Plan  - empiric cefepime  11/30/21 onwards   Best Practice (right click and "Reselect all SmartList Selections" daily)  Diet/type: Regular consistency (see orders) -> change to NPO except meds DVT prophylaxis: LMWH GI prophylaxis: N/A Lines: N/A Foley:  N/A Code Status:  full code Last date of multidisciplinary goals of care discussion - father updated 11/28/21,  11/29/21, 11/30/21  Picc re-ordered 6/3 - unable to get Panda re-ordered 6/3 - unable to get     ATTESTATION & SIGNATURE   The patient Parmod Legore is critically ill with multiple organ systems failure and requires high complexity decision making for assessment and support, frequent evaluation and titration of therapies, application of advanced monitoring technologies and extensive interpretation of multiple databases.   Critical Care Time devoted to patient care services described in this note is  35  Minutes. This time reflects time of care of this signee Dr Brand Males. This critical care time does not reflect  procedure time, or teaching time or supervisory time of PA/NP/Med student/Med Resident etc but could involve care discussion time     Dr. Brand Males, M.D., [facility] Pulmonary and Critical Care Medicine Medical Director - [facility] ICU Staff Physician, [facility] Pager: [phone], If no answer or between  15:00h - 7:00h: call [phone]  11/30/2021 8:21 AM      LABS    Anti-infectives (From admission, onward)    Start     Dose/Rate Route Frequency Ordered Stop   11/26/21 1330  rifaximin (XIFAXAN) tablet 550 mg        550 mg Oral 2 times daily 11/26/21 1214          PULMONARY Recent Labs  Lab 11/28/21 0900  PHART 7.38  PCO2ART 38  PO2ART 74*  HCO3 22.5  O2SAT 97.4    CBC Recent Labs  Lab 11/28/21 0252 11/29/21 0307 11/30/21 0305  HGB 12.1* 11.8* 13.1  HCT 35.3* 35.3* 39.4  WBC 11.3* 11.6* 13.9*  PLT 191 202 207    COAGULATION Recent  Labs  Lab 11/24/21 1031 11/25/21 0640 11/26/21 0304 11/29/21 0307 11/30/21 0305  INR 1.7* 1.6* 1.7* 1.4* 1.4*    CARDIAC  No results for input(s): TROPONINI in the last 168 hours. No results for input(s): PROBNP in the last 168 hours.   CHEMISTRY Recent Labs  Lab 11/25/21 0640 11/26/21 0304 11/27/21 0544 11/28/21 0252 11/29/21 0307 11/30/21 0305  NA 138 137 138 136 136 138  K 4.2 4.2 4.1 4.2 3.7 4.2  CL 108 107 108 111 107 107  CO2 24 25 24  21* 24 23  GLUCOSE 126* 107* 98 123* 101* 120*  BUN 16 15 21* 23* 17 17  CREATININE 0.50* 0.67 0.95 0.90 0.76 0.71  CALCIUM 8.6* 8.6* 8.6* 8.6* 8.3* 8.4*  MG 2.0  --  1.8 2.1 1.7 2.0  PHOS  --   --   --  3.5 3.7 4.0   Estimated Creatinine Clearance: 180.4 mL/min (by C-G formula based on SCr of 0.71 mg/dL).   LIVER Recent Labs  Lab 11/24/21 1031 11/25/21 0640 11/26/21 0304 11/27/21 0544 11/28/21 0252 11/29/21 0307 11/30/21 0305  AST 217* 173* 175* 187* 207* 203* 224*  ALT 55* 47* 53* 62* 78* 83* 94*  ALKPHOS 134* 125 121 122 125 117 126  BILITOT 19.7* 16.3* 15.0* 14.3* 13.2* 11.1* 11.2*  PROT 6.3* 6.2* 5.8* 5.9* 6.0* 5.8* 6.2*  ALBUMIN 2.1* 2.6* 2.3* 2.2* 2.2* 2.0* 2.0*  INR 1.7* 1.6* 1.7*  --   --  1.4* 1.4*     INFECTIOUS Recent Labs  Lab 11/29/21 0307 11/30/21 0305  LATICACIDVEN 0.8 1.0  PROCALCITON 1.76 1.33     ENDOCRINE CBG (last 3)  No results for input(s): GLUCAP in the last 72 hours.       IMAGING x48h  - image(s) personally visualized  -   highlighted in bold DG Abd 1 View  Result Date: 11/29/2021 CLINICAL DATA:  [id number].  Feeding tube placement. EXAM: ABDOMEN - 1 VIEW COMPARISON:  None Available. FINDINGS: Enteric tube coursing below the hemidiaphragm with tip overlying the expected region the gastric lumen. The bowel gas pattern is normal. No radio-opaque calculi or other significant radiographic abnormality are seen. IMPRESSION: Enteric tube coursing below the hemidiaphragm with tip  overlying the expected region the gastric lumen. Electronically Signed   By: Iven Finn M.D.   On: 11/29/2021 16:39  DG CHEST PORT 1 VIEW  Result Date: 11/28/2021 CLINICAL DATA:  Hypoxemia. EXAM: PORTABLE CHEST 1 VIEW COMPARISON:  11/24/2021 FINDINGS: The heart appears enlarged, possibly accentuated by portable AP technique and low lung volumes. Increasing patchy bibasilar atelectasis, possible small pleural effusions. Mild vascular congestion. No pneumothorax. IMPRESSION: 1. Low lung volumes with increasing bibasilar atelectasis and possible small pleural effusions. 2. Vascular congestion. Apparent cardiomegaly, possibly accentuated by portable AP technique and low lung volumes. Electronically Signed   By: Keith Rake M.D.   On: 11/28/2021 20:45   ECHOCARDIOGRAM COMPLETE  Result Date: 11/29/2021    ECHOCARDIOGRAM REPORT   Patient Name:   EDRICK COST Date of Exam: 11/29/2021 Medical Rec #:  [id number]       Height:       73.0 in Accession #:    [id number]      Weight:       286.2 lb Date of Birth:  12-13-1985      BSA:          2.505 m Patient Age:    34 years        BP:           110/63 mmHg Patient Gender: M               HR:           65 bpm. Exam Location:  Inpatient Procedure: 2D Echo, Cardiac Doppler, Color Doppler and Intracardiac            Opacification Agent Indications:    Respiratory distress  History:        Patient has no prior history of Echocardiogram examinations.  Sonographer:    Jyl Heinz Referring Phys: [phone] [place]  Sonographer Comments: Patient is morbidly obese. Image acquisition challenging due to patient body habitus. Pt in restraints. IMPRESSIONS  1. Akinesis of the anterior/anteroseptal wall base-apex. Left ventricular ejection fraction, by estimation, is 45 to 50%. The left ventricle has mildly decreased function. The left ventricle demonstrates global hypokinesis. Left ventricular diastolic parameters were normal.  2. Right ventricular systolic function is  normal. The right ventricular size is normal.  3. No evidence of mitral valve regurgitation.  4. Aortic valve regurgitation is not visualized.  5. The inferior vena cava is dilated in size with >50% respiratory variability, suggesting right atrial pressure of 8 mmHg. Conclusion(s)/Recommendation(s): Has LAD terriotory wall motion abnormality, no prior echo to compare. FINDINGS  Left Ventricle: Akinesis of the anterior/anteroseptal wall base-apex. Left ventricular ejection fraction, by estimation, is 45 to 50%. The left ventricle has mildly decreased function. The left ventricle demonstrates global hypokinesis. Definity contrast agent was given IV to delineate the left ventricular endocardial borders. The left ventricular internal cavity size was normal in size. There is no left ventricular hypertrophy. Left ventricular diastolic parameters were normal. Right Ventricle: The right ventricular size is normal. Right ventricular systolic function is normal. Left Atrium: Left atrial size was normal in size. Right Atrium: Right atrial size was normal in size. Pericardium: There is no evidence of pericardial effusion. Mitral Valve: No evidence of mitral valve regurgitation. Tricuspid Valve: Tricuspid valve regurgitation is not demonstrated. Aortic Valve: Aortic valve regurgitation is not visualized. Aortic valve peak gradient measures 10.9 mmHg. Pulmonic Valve: Pulmonic valve regurgitation is not visualized. Venous: The inferior vena cava is dilated in size with greater than 50% respiratory variability, suggesting right atrial pressure of 8 mmHg.  LEFT VENTRICLE PLAX 2D LVIDd:  6.00 cm      Diastology LVIDs:         4.80 cm      LV e' medial:    9.79 cm/s LV PW:         1.10 cm      LV E/e' medial:  12.3 LV IVS:        0.80 cm      LV e' lateral:   10.90 cm/s LVOT diam:     2.00 cm      LV E/e' lateral: 11.0 LV SV:         74 LV SV Index:   29 LVOT Area:     3.14 cm  LV Volumes (MOD) LV vol d, MOD A2C: 222.0 ml LV  vol d, MOD A4C: 175.0 ml LV vol s, MOD A2C: 126.0 ml LV vol s, MOD A4C: 98.6 ml LV SV MOD A2C:     96.0 ml LV SV MOD A4C:     175.0 ml LV SV MOD BP:      88.1 ml RIGHT VENTRICLE             IVC RV Basal diam:  4.60 cm     IVC diam: 2.10 cm RV Mid diam:    4.00 cm RV S prime:     17.30 cm/s TAPSE (M-mode): 3.5 cm LEFT ATRIUM             Index        RIGHT ATRIUM           Index LA diam:        4.00 cm 1.60 cm/m   RA Area:     26.80 cm LA Vol (A2C):   66.9 ml 26.70 ml/m  RA Volume:   95.30 ml  38.04 ml/m LA Vol (A4C):   68.4 ml 27.30 ml/m LA Biplane Vol: 68.7 ml 27.42 ml/m  AORTIC VALVE AV Area (Vmax): 2.09 cm AV Vmax:        165.00 cm/s AV Peak Grad:   10.9 mmHg LVOT Vmax:      110.00 cm/s LVOT Vmean:     82.300 cm/s LVOT VTI:       0.235 m  AORTA Ao Root diam: 3.40 cm Ao Asc diam:  3.60 cm MITRAL VALVE MV Area (PHT): 2.87 cm     SHUNTS MV Decel Time: 264 msec     Systemic VTI:  0.24 m MV E velocity: 120.00 cm/s  Systemic Diam: 2.00 cm MV A velocity: 66.80 cm/s MV E/A ratio:  1.80 Landscape architect signed by Phineas Inches Signature Date/Time: 11/29/2021/1:17:49 PM    Final    [place] EKG SITE RITE  Result Date: 11/29/2021 If Site Rite image not attached, placement could not be confirmed due to current cardiac rhythm.  [place] EKG SITE RITE  Result Date: 11/28/2021 If Site Rite image not attached, placement could not be confirmed due to current cardiac rhythm.  US Abdomen Limited RUQ (LIVER/GB)  Result Date: 11/28/2021 CLINICAL DATA:  Jaundice EXAM: ULTRASOUND ABDOMEN LIMITED RIGHT UPPER QUADRANT COMPARISON:  CT abdomen and pelvis 11/22/2021 FINDINGS: Gallbladder: Dependent sludge. Upper normal gallbladder wall thickness. No discrete shadowing calculi. Patient able to position LEFT lateral decubitus for additional imaging. Common bile duct: Diameter: 7 mm, dilated for age Liver: Echogenic parenchyma consistent with marked fatty infiltration as seen on CT. No gross hepatic mass or nodularity identified  though assessment of intrahepatic detail is degraded by sound attenuation. Portal vein is patent on  color Doppler imaging. Turbulent flow within portal vein. Other: No RIGHT upper quadrant free fluid. IMPRESSION: Marked fatty infiltration of liver. Sludge within gallbladder with upper normal wall thickness but no discrete calculi. Electronically Signed   By: Lavonia Dana M.D.   On: 11/28/2021 10:28

## 2021-12-01 DIAGNOSIS — F10239 Alcohol dependence with withdrawal, unspecified: Secondary | ICD-10-CM | POA: Diagnosis not present

## 2021-12-01 DIAGNOSIS — J9601 Acute respiratory failure with hypoxia: Secondary | ICD-10-CM | POA: Diagnosis not present

## 2021-12-01 DIAGNOSIS — K7010 Alcoholic hepatitis without ascites: Secondary | ICD-10-CM | POA: Diagnosis not present

## 2021-12-01 LAB — CBC
HCT: 41.8 % (ref 39.0–52.0)
Hemoglobin: 13.8 g/dL (ref 13.0–17.0)
MCH: 33.8 pg (ref 26.0–34.0)
MCHC: 33 g/dL (ref 30.0–36.0)
MCV: 102.5 fL — ABNORMAL HIGH (ref 80.0–100.0)
Platelets: 231 10*3/uL (ref 150–400)
RBC: 4.08 MIL/uL — ABNORMAL LOW (ref 4.22–5.81)
RDW: 21 % — ABNORMAL HIGH (ref 11.5–15.5)
WBC: 13.5 10*3/uL — ABNORMAL HIGH (ref 4.0–10.5)
nRBC: 0 % (ref 0.0–0.2)

## 2021-12-01 LAB — COMPREHENSIVE METABOLIC PANEL
ALT: 109 U/L — ABNORMAL HIGH (ref 0–44)
AST: 232 U/L — ABNORMAL HIGH (ref 15–41)
Albumin: 2.1 g/dL — ABNORMAL LOW (ref 3.5–5.0)
Alkaline Phosphatase: 119 U/L (ref 38–126)
Anion gap: 8 (ref 5–15)
BUN: 16 mg/dL (ref 6–20)
CO2: 25 mmol/L (ref 22–32)
Calcium: 8.4 mg/dL — ABNORMAL LOW (ref 8.9–10.3)
Chloride: 105 mmol/L (ref 98–111)
Creatinine, Ser: 0.67 mg/dL (ref 0.61–1.24)
GFR, Estimated: >60 mL/min (ref >60)
Glucose, Bld: 112 mg/dL — ABNORMAL HIGH (ref 70–99)
Potassium: 3.9 mmol/L (ref 3.5–5.1)
Sodium: 138 mmol/L (ref 135–145)
Total Bilirubin: 9.6 mg/dL — ABNORMAL HIGH (ref 0.3–1.2)
Total Protein: 6.6 g/dL (ref 6.5–8.1)

## 2021-12-01 LAB — HEPATITIS PANEL, ACUTE
HCV Ab: REACTIVE — AB
Hep A IgM: NONREACTIVE
Hep B C IgM: NONREACTIVE
Hepatitis B Surface Ag: NONREACTIVE

## 2021-12-01 LAB — HEPATITIS B CORE ANTIBODY, TOTAL: Hep B Core Total Ab: NONREACTIVE

## 2021-12-01 LAB — HEPATITIS A ANTIBODY, TOTAL: hep A Total Ab: REACTIVE — AB

## 2021-12-01 LAB — HEPATITIS B SURFACE ANTIGEN: Hepatitis B Surface Ag: NONREACTIVE

## 2021-12-01 LAB — PROTIME-INR
INR: 1.3 — ABNORMAL HIGH (ref 0.8–1.2)
Prothrombin Time: 16.5 seconds — ABNORMAL HIGH (ref 11.4–15.2)

## 2021-12-01 LAB — PHOSPHORUS: Phosphorus: 3.9 mg/dL (ref 2.5–4.6)

## 2021-12-01 LAB — MAGNESIUM: Magnesium: 1.7 mg/dL (ref 1.7–2.4)

## 2021-12-01 LAB — PROCALCITONIN: Procalcitonin: 1.19 ng/mL

## 2021-12-01 MED ORDER — FUROSEMIDE 10 MG/ML IJ SOLN
40.0000 mg | Freq: Every day | INTRAMUSCULAR | Status: DC
  Administered 2021-12-01 – 2021-12-05 (×5): 40 mg via INTRAVENOUS
  Filled 2021-12-01 (×5): qty 4

## 2021-12-01 NOTE — Progress Notes (Signed)
NAME:  Enki Masley, MRN:  [id number], DOB:  [birth year], LOS: 9 ADMISSION DATE:  11/22/2021, CONSULTATION DATE:  11/23/2021 REFERRING MD:  Pearletha Forge MD, CHIEF COMPLAINT: Alcohol withdrawal, requirement for Precedex drip  BRIEF  36 year old M with history of alcohol dependence admitted 5/27 with vomiting, concern for alcohol withdrawal.  Work up consistent with acute alcoholic hepatitis, hyperbilirubinemia. He developed acute withdrawal, DTs. Admitted to stepdown unit and started on Precedex infusion.  Pertinent  Medical History  Anxiety  Depression  Back Pain  Drug Overdose  Mental Disorder    Significant Hospital Events: Including procedures, antibiotic start and stop dates in addition to other pertinent events   5/27 Admit with ETOH withdrawal  5/31 off precedex, to floor 6/1 - As librium tapered, pt developed increased agitation today requiring 10 mg IV ativan early this afternoon. Kept insisting he wanted to go home. Father said he planned to be out of town beginning tomorrow and was hopeful he'd stick around here to ride out his encephalopathy. +8L volume overload 6/2 - ains on precedex gtt. Bilirubin improving. AST/ALT worse. INR not checked. Afebrile. Phenobarb protcol added. Did not cooperate for PICC line 6/3- LFT/Bili/INR all better. Remains in precedex and phenobarb protocol. More hypoxemic 6L [place]. And lasix given - >resp better per RN but still on 6L [place] and Pulse ox 95%.   Low grade temp 100.1 (new). Ammonia 54. RUQ [place] - fatty liver. No cirrhosis.   Improved volume overload to +4.8L 6/4 agitation , on precedex  Interim History / Subjective:   Less agitation per RN. Remains critically ill, on Precedex drip. Afebrile    Objective   Blood pressure 118/68, pulse 62, temperature (!) 97.2 F (36.2 C), temperature source Axillary, resp. rate (!) 21, height 6\' 1"  (1.854 m), weight 129.8 kg, SpO2 (!) 89 %.        Intake/Output Summary (Last 24 hours) at 12/01/2021 0818 Last  data filed at 12/01/2021 0715 Gross per 24 hour  Intake 1701.65 ml  Output 6550 ml  Net -4848.35 ml    Filed Weights   11/22/21 1410 11/22/21 2241  Weight: 127 kg 129.8 kg   General Appearance:  Looks criticall ill OBESE - + Head:  Normocephalic, without obvious abnormality, atraumatic Eyes:  PERRL - yes, conjunctiva/corneas - JAUNDICE     Ears:  Normal external ear canals, both ears Nose:  G tube - NO but has [place] Throat:  ETT TUBE - no , OG tube - no Neck:  Supple,  No enlargement/tenderness/nodules Lungs: No accessory muscle use, decreased breath sounds bilateral Heart:  S1 and S2 normal, no murmur, CVP - no.  Pressors - no Abdomen:  Soft, no masses, no organomegaly Genitalia / Rectal:  Not done Extremities:  Extremities- intact with some edema.  Skin:  ntact in exposed areas . Sacral area - not examined. JAUNDICE Neurologic: Follows one-step commands, no asterixis, oriented to place and person, not to time   Labs show normal electrolytes, decreased bilirubin from 11-9.6, stable liver enzymes     Resolved Hospital Problem list     Assessment & Plan:   Acute toxic and metabolic encephalopathy due to Acute Alcohol Withdrawal with DTs, hyperammonemia  On 6/5 appears calm, on Precedex drip   Plan - added scheduled  haldol 11/30/21 -we will taper to off once Precedex off -Taper Precedex infusion as tolerated for goal RASS 0 - scheduled phenobarb protocol since 11/28/21  - change ativan prn to versed prn   - given raised  LFT - -lactulose TID and Xifaxan -hopefully he can tolerate p.o. and oral lactulose - -continue thiamine, folate, MVI  -promote sleep / wake cycle  -delirium prevention measures    Acute Alcoholic Hepatitis - Maddrey's 42.9 on admission  (RUQ [place] 11/28/21 with fatty liver only, no cirrhosis)    Plan -continue Solu-Medrol 40 mg QD   -check hepatitis virus panel 12/01/21 - monitor for drug induced liver injury (all drugs he is currently on)  - change  ativan to versed prn -trend LFT's  and INR -check Hep Virus panel 12/01/21   Anemia of critical illness   Plan  - - PRBC for hgb </= 6.9gm%      Acute respiratory failure - [place] o2 need - first documented 11/28/21. Worsened to 6L [place] 11/29/21.  - etiology HCAP, volume ovreload and atelecatis  Chest x-ray shows stable bilateral infiltrates Echo shows EF 45 to 50%, akinesis of anterior/anteroseptal wall, global hypokinesis Plan Continue Lasix, good diuresis, aim for negative balance -We will need cardiology consult     SIRs with fever 11/29/21. PCT > 1.0 and suggest sepsis/HCAP New pulmonary infiltrates  Plan  - empiric cefepime 11/30/21 -plan for 5 days   Best Practice (right click and "Reselect all SmartList Selections" daily)  Diet/type: Regular consistency (see orders) -> change to NPO except meds DVT prophylaxis: LMWH GI prophylaxis: N/A Lines: N/A Foley:  N/A Code Status:  full code Last date of multidisciplinary goals of care discussion - father updated daily     LABS    Anti-infectives (From admission, onward)    Start     Dose/Rate Route Frequency Ordered Stop   11/30/21 0900  ceFEPIme (MAXIPIME) 2 g in sodium chloride 0.9 % 100 mL IVPB        2 g 200 mL/hr over 30 Minutes Intravenous Every 8 hours 11/30/21 0812     11/26/21 1330  rifaximin (XIFAXAN) tablet 550 mg        550 mg Oral 2 times daily 11/26/21 1214          PULMONARY Recent Labs  Lab 11/28/21 0900  PHART 7.38  PCO2ART 38  PO2ART 74*  HCO3 22.5  O2SAT 97.4     CBC Recent Labs  Lab 11/29/21 0307 11/30/21 0305 12/01/21 0310  HGB 11.8* 13.1 13.8  HCT 35.3* 39.4 41.8  WBC 11.6* 13.9* 13.5*  PLT 202 207 231     COAGULATION Recent Labs  Lab 11/25/21 0640 11/26/21 0304 11/29/21 0307 11/30/21 0305 12/01/21 0310  INR 1.6* 1.7* 1.4* 1.4* 1.3*     CARDIAC  No results for input(s): TROPONINI in the last 168 hours. No results for input(s): PROBNP in the last 168  hours.   CHEMISTRY Recent Labs  Lab 11/27/21 0544 11/28/21 0252 11/29/21 0307 11/30/21 0305 12/01/21 0310  NA 138 136 136 138 138  K 4.1 4.2 3.7 4.2 3.9  CL 108 111 107 107 105  CO2 24 21* 24 23 25   GLUCOSE 98 123* 101* 120* 112*  BUN 21* 23* 17 17 16   CREATININE 0.95 0.90 0.76 0.71 0.67  CALCIUM 8.6* 8.6* 8.3* 8.4* 8.4*  MG 1.8 2.1 1.7 2.0 1.7  PHOS  --  3.5 3.7 4.0 3.9    Estimated Creatinine Clearance: 180.4 mL/min (by C-G formula based on SCr of 0.67 mg/dL).   LIVER Recent Labs  Lab 11/25/21 0640 11/26/21 0304 11/27/21 0544 11/28/21 0252 11/29/21 0307 11/30/21 0305 12/01/21 0310  AST 173* 175* 187* 207* 203* 224* 232*  ALT 47* 53* 62* 78* 83* 94* 109*  ALKPHOS 125 121 122 125 117 126 119  BILITOT 16.3* 15.0* 14.3* 13.2* 11.1* 11.2* 9.6*  PROT 6.2* 5.8* 5.9* 6.0* 5.8* 6.2* 6.6  ALBUMIN 2.6* 2.3* 2.2* 2.2* 2.0* 2.0* 2.1*  INR 1.6* 1.7*  --   --  1.4* 1.4* 1.3*      INFECTIOUS Recent Labs  Lab 11/29/21 0307 11/30/21 0305 12/01/21 0310  LATICACIDVEN 0.8 1.0  --   PROCALCITON 1.76 1.33 1.19      ENDOCRINE CBG (last 3)  No results for input(s): GLUCAP in the last 72 hours.       IMAGING x48h  - image(s) personally visualized  -   highlighted in bold DG Abd 1 View  Result Date: 11/29/2021 CLINICAL DATA:  [id number].  Feeding tube placement. EXAM: ABDOMEN - 1 VIEW COMPARISON:  None Available. FINDINGS: Enteric tube coursing below the hemidiaphragm with tip overlying the expected region the gastric lumen. The bowel gas pattern is normal. No radio-opaque calculi or other significant radiographic abnormality are seen. IMPRESSION: Enteric tube coursing below the hemidiaphragm with tip overlying the expected region the gastric lumen. Electronically Signed   By: Iven Finn M.D.   On: 11/29/2021 16:39   DG CHEST PORT 1 VIEW  Result Date: 11/30/2021 CLINICAL DATA:  Alcohol withdrawal. EXAM: PORTABLE CHEST 1 VIEW COMPARISON:  11/28/2021 FINDINGS: Right arm  PICC line is seen in place with tip overlying the proximal right atrium. Low lung volumes again noted. Cardiomegaly stable. Diffuse interstitial infiltrates are stable as well as bibasilar atelectasis. IMPRESSION: Right arm PICC line tip overlies the proximal right atrium. Stable cardiomegaly and diffuse interstitial infiltrates/edema. Stable low lung volumes and bibasilar atelectasis. Electronically Signed   By: Marlaine Hind M.D.   On: 11/30/2021 08:24   ECHOCARDIOGRAM COMPLETE  Result Date: 11/29/2021    ECHOCARDIOGRAM REPORT   Patient Name:   TRAMMELL LORONA Date of Exam: 11/29/2021 Medical Rec #:  [id number]       Height:       73.0 in Accession #:    [id number]      Weight:       286.2 lb Date of Birth:  Mar 27, 1986      BSA:          2.505 m Patient Age:    51 years        BP:           110/63 mmHg Patient Gender: M               HR:           65 bpm. Exam Location:  Inpatient Procedure: 2D Echo, Cardiac Doppler, Color Doppler and Intracardiac            Opacification Agent Indications:    Respiratory distress  History:        Patient has no prior history of Echocardiogram examinations.  Sonographer:    Jyl Heinz Referring Phys: [phone] [place]  Sonographer Comments: Patient is morbidly obese. Image acquisition challenging due to patient body habitus. Pt in restraints. IMPRESSIONS  1. Akinesis of the anterior/anteroseptal wall base-apex. Left ventricular ejection fraction, by estimation, is 45 to 50%. The left ventricle has mildly decreased function. The left ventricle demonstrates global hypokinesis. Left ventricular diastolic parameters were normal.  2. Right ventricular systolic function is normal. The right ventricular size is normal.  3. No evidence of mitral valve regurgitation.  4. Aortic valve regurgitation is not visualized.  5. The inferior vena cava is dilated in size with >50% respiratory variability, suggesting right atrial pressure of 8 mmHg. Conclusion(s)/Recommendation(s): Has LAD  terriotory wall motion abnormality, no prior echo to compare. FINDINGS  Left Ventricle: Akinesis of the anterior/anteroseptal wall base-apex. Left ventricular ejection fraction, by estimation, is 45 to 50%. The left ventricle has mildly decreased function. The left ventricle demonstrates global hypokinesis. Definity contrast agent was given IV to delineate the left ventricular endocardial borders. The left ventricular internal cavity size was normal in size. There is no left ventricular hypertrophy. Left ventricular diastolic parameters were normal. Right Ventricle: The right ventricular size is normal. Right ventricular systolic function is normal. Left Atrium: Left atrial size was normal in size. Right Atrium: Right atrial size was normal in size. Pericardium: There is no evidence of pericardial effusion. Mitral Valve: No evidence of mitral valve regurgitation. Tricuspid Valve: Tricuspid valve regurgitation is not demonstrated. Aortic Valve: Aortic valve regurgitation is not visualized. Aortic valve peak gradient measures 10.9 mmHg. Pulmonic Valve: Pulmonic valve regurgitation is not visualized. Venous: The inferior vena cava is dilated in size with greater than 50% respiratory variability, suggesting right atrial pressure of 8 mmHg.  LEFT VENTRICLE PLAX 2D LVIDd:         6.00 cm      Diastology LVIDs:         4.80 cm      LV e' medial:    9.79 cm/s LV PW:         1.10 cm      LV E/e' medial:  12.3 LV IVS:        0.80 cm      LV e' lateral:   10.90 cm/s LVOT diam:     2.00 cm      LV E/e' lateral: 11.0 LV SV:         74 LV SV Index:   29 LVOT Area:     3.14 cm  LV Volumes (MOD) LV vol d, MOD A2C: 222.0 ml LV vol d, MOD A4C: 175.0 ml LV vol s, MOD A2C: 126.0 ml LV vol s, MOD A4C: 98.6 ml LV SV MOD A2C:     96.0 ml LV SV MOD A4C:     175.0 ml LV SV MOD BP:      88.1 ml RIGHT VENTRICLE             IVC RV Basal diam:  4.60 cm     IVC diam: 2.10 cm RV Mid diam:    4.00 cm RV S prime:     17.30 cm/s TAPSE (M-mode): 3.5  cm LEFT ATRIUM             Index        RIGHT ATRIUM           Index LA diam:        4.00 cm 1.60 cm/m   RA Area:     26.80 cm LA Vol (A2C):   66.9 ml 26.70 ml/m  RA Volume:   95.30 ml  38.04 ml/m LA Vol (A4C):   68.4 ml 27.30 ml/m LA Biplane Vol: 68.7 ml 27.42 ml/m  AORTIC VALVE AV Area (Vmax): 2.09 cm AV Vmax:        165.00 cm/s AV Peak Grad:   10.9 mmHg LVOT Vmax:      110.00 cm/s LVOT Vmean:     82.300 cm/s LVOT VTI:       0.235 m  AORTA Ao Root diam:  3.40 cm Ao Asc diam:  3.60 cm MITRAL VALVE MV Area (PHT): 2.87 cm     SHUNTS MV Decel Time: 264 msec     Systemic VTI:  0.24 m MV E velocity: 120.00 cm/s  Systemic Diam: 2.00 cm MV A velocity: 66.80 cm/s MV E/A ratio:  1.80 Placido Sou signed by Phineas Inches Signature Date/Time: 11/29/2021/1:17:49 PM    Final    [place] EKG SITE RITE  Result Date: 11/29/2021 If Site Rite image not attached, placement could not be confirmed due to current cardiac rhythm.     ATTESTATION & SIGNATURE   The patient Sabir Sportsman is critically ill with multiple organ systems failure and requires high complexity decision making for assessment and support, frequent evaluation and titration of therapies, application of advanced monitoring technologies and extensive interpretation of multiple databases.   Critical Care Time devoted to patient care services described in this note is  32  Minutes. This time reflects time of care of this signee This critical care time does not reflect procedure time, or teaching time or supervisory time of PA/NP/Med student/Med Resident etc but could Kara Mead MD. Shade Flood. [facility] Pager : [phone]  If no response to pager , please call [phone] until 7 pm After 7:00 pm call Elink  [phone]     12/01/2021 8:18 AM

## 2021-12-01 NOTE — Progress Notes (Signed)
PHARMACY NOTE -  Cefepime  Pharmacy has been assisting with dosing of cefepime for r/o HAP. Dosage remains stable at 2g IV q8 hr and further renal adjustments per institutional Pharmacy antibiotic protocol  Pharmacy will sign off, following peripherally for culture results or dose adjustments. Please reconsult if a change in clinical status warrants re-evaluation of dosage.  Reuel Boom, PharmD, BCPS [phone] 12/01/2021, 12:47 PM

## 2021-12-01 NOTE — Progress Notes (Signed)
Patient bed alarm going off and on arrival patient running out of room and through hall way. Patient stopped by staff members and security called to help escort patient back to room.   Staff verbally deescalated patient and he agreed to sit in chair so he could be wheeled back to room. Helped back into bed and changed gown.  Precedex reconnected and restarted. PICC noted to still be intact.

## 2021-12-02 ENCOUNTER — Inpatient Hospital Stay (HOSPITAL_COMMUNITY): Payer: Commercial Managed Care - HMO

## 2021-12-02 DIAGNOSIS — F10239 Alcohol dependence with withdrawal, unspecified: Secondary | ICD-10-CM | POA: Diagnosis not present

## 2021-12-02 DIAGNOSIS — K7010 Alcoholic hepatitis without ascites: Secondary | ICD-10-CM | POA: Diagnosis not present

## 2021-12-02 LAB — BASIC METABOLIC PANEL
Anion gap: 9 (ref 5–15)
BUN: 21 mg/dL — ABNORMAL HIGH (ref 6–20)
CO2: 26 mmol/L (ref 22–32)
Calcium: 8.2 mg/dL — ABNORMAL LOW (ref 8.9–10.3)
Chloride: 103 mmol/L (ref 98–111)
Creatinine, Ser: 0.88 mg/dL (ref 0.61–1.24)
GFR, Estimated: >60 mL/min (ref >60)
Glucose, Bld: 96 mg/dL (ref 70–99)
Potassium: 3.8 mmol/L (ref 3.5–5.1)
Sodium: 138 mmol/L (ref 135–145)

## 2021-12-02 LAB — CBC WITH DIFFERENTIAL/PLATELET
Abs Immature Granulocytes: 0.58 10*3/uL — ABNORMAL HIGH (ref 0.00–0.07)
Basophils Absolute: 0.2 10*3/uL — ABNORMAL HIGH (ref 0.0–0.1)
Basophils Relative: 1 %
Eosinophils Absolute: 0.1 10*3/uL (ref 0.0–0.5)
Eosinophils Relative: 1 %
HCT: 36.7 % — ABNORMAL LOW (ref 39.0–52.0)
Hemoglobin: 12.5 g/dL — ABNORMAL LOW (ref 13.0–17.0)
Immature Granulocytes: 4 %
Lymphocytes Relative: 25 %
Lymphs Abs: 3.8 10*3/uL (ref 0.7–4.0)
MCH: 34.6 pg — ABNORMAL HIGH (ref 26.0–34.0)
MCHC: 34.1 g/dL (ref 30.0–36.0)
MCV: 101.7 fL — ABNORMAL HIGH (ref 80.0–100.0)
Monocytes Absolute: 1.6 10*3/uL — ABNORMAL HIGH (ref 0.1–1.0)
Monocytes Relative: 10 %
Neutro Abs: 9 10*3/uL — ABNORMAL HIGH (ref 1.7–7.7)
Neutrophils Relative %: 59 %
Platelets: 229 10*3/uL (ref 150–400)
RBC: 3.61 MIL/uL — ABNORMAL LOW (ref 4.22–5.81)
RDW: 20.7 % — ABNORMAL HIGH (ref 11.5–15.5)
WBC: 15.3 10*3/uL — ABNORMAL HIGH (ref 4.0–10.5)
nRBC: 0 % (ref 0.0–0.2)

## 2021-12-02 LAB — MAGNESIUM: Magnesium: 1.7 mg/dL (ref 1.7–2.4)

## 2021-12-02 LAB — HEPATITIS B E ANTIGEN: Hep B E Ag: NEGATIVE

## 2021-12-02 LAB — PHOSPHORUS: Phosphorus: 3.5 mg/dL (ref 2.5–4.6)

## 2021-12-02 MED ORDER — POTASSIUM CHLORIDE 10 MEQ/100ML IV SOLN
10.0000 meq | INTRAVENOUS | Status: AC
  Administered 2021-12-02 (×2): 10 meq via INTRAVENOUS
  Filled 2021-12-02 (×2): qty 100

## 2021-12-02 MED ORDER — MAGNESIUM SULFATE 2 GM/50ML IV SOLN
2.0000 g | Freq: Once | INTRAVENOUS | Status: AC
  Administered 2021-12-02: 2 g via INTRAVENOUS
  Filled 2021-12-02: qty 50

## 2021-12-02 MED ORDER — METHYLPREDNISOLONE SODIUM SUCC 40 MG IJ SOLR
20.0000 mg | INTRAMUSCULAR | Status: DC
Start: 2021-12-02 — End: 2021-12-04
  Administered 2021-12-02 – 2021-12-03 (×2): 20 mg via INTRAVENOUS
  Filled 2021-12-02 (×2): qty 1

## 2021-12-02 MED ORDER — ACETAMINOPHEN 325 MG PO TABS: 650.0000 mg | ORAL_TABLET | ORAL | Status: DC | PRN

## 2021-12-02 MED ORDER — IBUPROFEN 200 MG PO TABS
200.0000 mg | ORAL_TABLET | Freq: Four times a day (QID) | ORAL | Status: AC | PRN
  Administered 2021-12-02 – 2021-12-03 (×2): 200 mg via ORAL
  Filled 2021-12-02 (×2): qty 1

## 2021-12-02 NOTE — Progress Notes (Signed)
eLink Physician-Brief Progress Note Patient Name: Larry Adams DOB: 17-Jan-1986 MRN: [id number]   Date of Service  12/02/2021  HPI/Events of Note  Patient with generalized aches, abnormal LFT's but normal creatinine.  eICU Interventions  Ibuprofen 200 mg po Q 6 hours PRN aches x 3 doses.        Larry Adams Larry Adams 12/02/2021, 8:17 PM

## 2021-12-02 NOTE — Progress Notes (Signed)
NAME:  Larry Adams, MRN:  [id number], DOB:  1985/07/05, LOS: 57 ADMISSION DATE:  11/22/2021, CONSULTATION DATE:  11/23/21 REFERRING MD:  Roderic Palau, CHIEF COMPLAINT:  etoh withdrawal, precedex    History of Present Illness:   36 year old M with history of alcohol dependence admitted 5/27 with vomiting, concern for alcohol withdrawal.  Work up consistent with acute alcoholic hepatitis, hyperbilirubinemia. He developed acute withdrawal, DTs. Admitted to stepdown unit and started on Precedex infusion.  Pertinent  Medical History  Anxiety  Depression  Back Pain  Drug Overdose  Mental Disorder    Significant Hospital Events: Including procedures, antibiotic start and stop dates in addition to other pertinent events   5/27 Admit with ETOH withdrawal  5/31 off precedex, to floor 6/1 - As librium tapered, pt developed increased agitation today requiring 10 mg IV ativan early this afternoon. Kept insisting he wanted to go home. Father said he planned to be out of town beginning tomorrow and was hopeful he'd stick around here to ride out his encephalopathy. +8L volume overload 6/2 - ains on precedex gtt. Bilirubin improving. AST/ALT worse. INR not checked. Afebrile. Phenobarb protcol added. Did not cooperate for PICC line 6/3- LFT/Bili/INR all better. Remains in precedex and phenobarb protocol. More hypoxemic 6L [place]. And lasix given - >resp better per RN but still on 6L [employer] and Pulse ox 95%.   Low grade temp 100.1 (new). Ammonia 54. RUQ [place] - fatty liver. No cirrhosis.   Improved volume overload to +4.8L 6/4 agitation , on precedex  6/6 weaning rpecedex   Interim History / Subjective:  Was weaned off precedex yesterday but after running through the halls, this was restarted   In and out of sleep this morning, asking for ativan   Objective   Blood pressure 108/69, pulse 60, temperature (!) 97 F (36.1 C), temperature source Axillary, resp. rate 20, height 6\' 1"  (1.854 m), weight 129.8 kg, SpO2  97 %.        Intake/Output Summary (Last 24 hours) at 12/02/2021 0915 Last data filed at 12/02/2021 0810 Gross per 24 hour  Intake 1672.83 ml  Output 275 ml  Net 1397.83 ml   Filed Weights   11/22/21 1410 11/22/21 2241  Weight: 127 kg 129.8 kg    Examination: General: ill appearing adult M NAD  HENT: NCAT pink mm Lungs: Upper rhonchi.  Cardiovascular: rrr s1s2 no rgm  Abdomen: soft ndnt  Extremities: no acute joint deformity. Trace edema, non-pitting Skin: jaundice  Neuro: drowsy. Awakens to voice. Follows commands  GU: defer  Resolved Hospital Problem list     Assessment & Plan:   Acute toxic metabolic encephalopathy due to etoh withdrawal with Dts, hyperammonemia  Suspected superimposed ICU delirium  -9d into hx course, would suspect withdrawals are completed  P -delirium precautions -wean precedex as able  - PRN versed, PRN haldol. Phenobarb taper  -micronutrient supplementation   Acute respiratory failure with hypoxia  HCAP, atelectasis, volume overload  P -cefepime -IS, mobility  -diurese  -wean O2 as able  -PRN CXR   Acute alcoholic hepatitis  Possible Hep C infection  Hyperammonemia Hyperbilirubinemia -Maddreys 42.9 on admit. No cirrhosis on RUQ [place] -Hep A ab IgM non-reactive. Hep B non reactive  -HCV ab is reactive  P -lactulose, xifaxan, solumedrol -trend LFT, INR -sending for HCV RNA   HFrEF -EF 45-50% akinesis anterior/anteroseptal wall  P -diurese -will need cards consult   Anemia of critical illness  -trend PRN   Hypomagnesemia  Hypokalemia P -replace.  Cont to trend    Best Practice (right click and "Reselect all SmartList Selections" daily)   Diet/type: NPO DVT prophylaxis: LMWH GI prophylaxis: N/A Lines: Central line -- RUE PICC Foley:  N/A Code Status:  full code Last date of multidisciplinary goals of care discussion [daily updates to father]  Labs   CBC: Recent Labs  Lab 11/28/21 0252 11/29/21 0307 11/30/21 0305  12/01/21 0310 12/02/21 0500  WBC 11.3* 11.6* 13.9* 13.5* 15.3*  NEUTROABS  --   --   --   --  9.0*  HGB 12.1* 11.8* 13.1 13.8 12.5*  HCT 35.3* 35.3* 39.4 41.8 36.7*  MCV 100.6* 100.6* 102.1* 102.5* 101.7*  PLT 191 202 207 231 [id number]    Basic Metabolic Panel: Recent Labs  Lab 11/28/21 0252 11/29/21 0307 11/30/21 0305 12/01/21 0310 12/02/21 0500  NA 136 136 138 138 138  K 4.2 3.7 4.2 3.9 3.8  CL 111 107 107 105 103  CO2 21* 24 23 25 26   GLUCOSE 123* 101* 120* 112* 96  BUN 23* 17 17 16  21*  CREATININE 0.90 0.76 0.71 0.67 0.88  CALCIUM 8.6* 8.3* 8.4* 8.4* 8.2*  MG 2.1 1.7 2.0 1.7 1.7  PHOS 3.5 3.7 4.0 3.9 3.5   GFR: Estimated Creatinine Clearance: 164 mL/min (by C-G formula based on SCr of 0.88 mg/dL). Recent Labs  Lab 11/29/21 0307 11/30/21 0305 12/01/21 0310 12/02/21 0500  PROCALCITON 1.76 1.33 1.19  --   WBC 11.6* 13.9* 13.5* 15.3*  LATICACIDVEN 0.8 1.0  --   --     Liver Function Tests: Recent Labs  Lab 11/27/21 0544 11/28/21 0252 11/29/21 0307 11/30/21 0305 12/01/21 0310  AST 187* 207* 203* 224* 232*  ALT 62* 78* 83* 94* 109*  ALKPHOS 122 125 117 126 119  BILITOT 14.3* 13.2* 11.1* 11.2* 9.6*  PROT 5.9* 6.0* 5.8* 6.2* 6.6  ALBUMIN 2.2* 2.2* 2.0* 2.0* 2.1*   Recent Labs  Lab 11/26/21 1400  LIPASE 378*   Recent Labs  Lab 11/26/21 0304 11/29/21 0307  AMMONIA 33 54*    ABG    Component Value Date/Time   PHART 7.38 11/28/2021 0900   PCO2ART 38 11/28/2021 0900   PO2ART 74 (L) 11/28/2021 0900   HCO3 22.5 11/28/2021 0900   TCO2 26 10/30/2010 0116   ACIDBASEDEF 2.3 (H) 11/28/2021 0900   O2SAT 97.4 11/28/2021 0900     Coagulation Profile: Recent Labs  Lab 11/26/21 0304 11/29/21 0307 11/30/21 0305 12/01/21 0310  INR 1.7* 1.4* 1.4* 1.3*    Cardiac Enzymes: No results for input(s): CKTOTAL, CKMB, CKMBINDEX, TROPONINI in the last 168 hours.  HbA1C: Hgb A1c MFr Bld  Date/Time Value Ref Range Status  04/18/2020 06:20 AM 5.2 4.8 - 5.6 %  Final    Comment:    (NOTE)         Prediabetes: 5.7 - 6.4         Diabetes: >6.4         Glycemic control for adults with diabetes: <7.0     CBG: No results for input(s): GLUCAP in the last 168 hours.  CRITICAL CARE Performed by: Cristal Generous   Total critical care time: 40 minutes  Critical care time was exclusive of separately billable procedures and treating other patients.  Critical care was necessary to treat or prevent imminent or life-threatening deterioration.  Critical care was time spent personally by me on the following activities: development of treatment plan with patient and/or surrogate as well as nursing, discussions  with consultants, evaluation of patient's response to treatment, examination of patient, obtaining history from patient or surrogate, ordering and performing treatments and interventions, ordering and review of laboratory studies, ordering and review of radiographic studies, pulse oximetry and re-evaluation of patient's condition.  Eliseo Gum MSN, AGACNP-BC [place] for pager  12/02/2021, 9:15 AM

## 2021-12-03 DIAGNOSIS — F10930 Alcohol use, unspecified with withdrawal, uncomplicated: Secondary | ICD-10-CM | POA: Diagnosis not present

## 2021-12-03 DIAGNOSIS — F10239 Alcohol dependence with withdrawal, unspecified: Secondary | ICD-10-CM | POA: Diagnosis not present

## 2021-12-03 LAB — BASIC METABOLIC PANEL
Anion gap: 8 (ref 5–15)
BUN: 19 mg/dL (ref 6–20)
CO2: 26 mmol/L (ref 22–32)
Calcium: 8.3 mg/dL — ABNORMAL LOW (ref 8.9–10.3)
Chloride: 103 mmol/L (ref 98–111)
Creatinine, Ser: 0.93 mg/dL (ref 0.61–1.24)
GFR, Estimated: >60 mL/min (ref >60)
Glucose, Bld: 117 mg/dL — ABNORMAL HIGH (ref 70–99)
Potassium: 3.3 mmol/L — ABNORMAL LOW (ref 3.5–5.1)
Sodium: 137 mmol/L (ref 135–145)

## 2021-12-03 LAB — CBC
HCT: 35.4 % — ABNORMAL LOW (ref 39.0–52.0)
Hemoglobin: 11.9 g/dL — ABNORMAL LOW (ref 13.0–17.0)
MCH: 34.4 pg — ABNORMAL HIGH (ref 26.0–34.0)
MCHC: 33.6 g/dL (ref 30.0–36.0)
MCV: 102.3 fL — ABNORMAL HIGH (ref 80.0–100.0)
Platelets: 250 10*3/uL (ref 150–400)
RBC: 3.46 MIL/uL — ABNORMAL LOW (ref 4.22–5.81)
RDW: 20.2 % — ABNORMAL HIGH (ref 11.5–15.5)
WBC: 18.9 10*3/uL — ABNORMAL HIGH (ref 4.0–10.5)
nRBC: 0 % (ref 0.0–0.2)

## 2021-12-03 LAB — HCV RNA QUANT: HCV Quantitative: NOT DETECTED IU/mL (ref >50)

## 2021-12-03 LAB — HCV RNA DIAGNOSIS, NAA: HCV RNA, Quantitation: NOT DETECTED IU/mL

## 2021-12-03 MED ORDER — LORAZEPAM 2 MG/ML IJ SOLN: 1.0000 mg | INTRAMUSCULAR | Status: DC | PRN

## 2021-12-03 MED ORDER — POLYETHYLENE GLYCOL 3350 17 G PO PACK
17.0000 g | PACK | Freq: Every day | ORAL | Status: DC
  Administered 2021-12-03 – 2021-12-05 (×2): 17 g via ORAL
  Filled 2021-12-03 (×3): qty 1

## 2021-12-03 MED ORDER — PHENOBARBITAL 32.4 MG PO TABS
64.8000 mg | ORAL_TABLET | Freq: Three times a day (TID) | ORAL | Status: AC
  Administered 2021-12-03 – 2021-12-04 (×4): 64.8 mg via ORAL
  Filled 2021-12-03 (×5): qty 2

## 2021-12-03 MED ORDER — LORAZEPAM 1 MG PO TABS
1.0000 mg | ORAL_TABLET | ORAL | Status: DC | PRN
  Administered 2021-12-03 – 2021-12-06 (×9): 1 mg via ORAL
  Filled 2021-12-03 (×9): qty 1

## 2021-12-03 MED ORDER — POTASSIUM CHLORIDE CRYS ER 20 MEQ PO TBCR
40.0000 meq | EXTENDED_RELEASE_TABLET | ORAL | Status: AC
  Administered 2021-12-03 (×2): 40 meq via ORAL
  Filled 2021-12-03 (×2): qty 2

## 2021-12-03 NOTE — Progress Notes (Signed)
NAME:  Larry Adams, MRN:  [id number], DOB:  18-May-1986, LOS: 11 ADMISSION DATE:  11/22/2021, CONSULTATION DATE:  11/23/21 REFERRING MD:  Kerry Hough, CHIEF COMPLAINT:  etoh withdrawal, precedex    History of Present Illness:   36 year old M with history of alcohol dependence admitted 5/27 with vomiting, concern for alcohol withdrawal.  Work up consistent with acute alcoholic hepatitis, hyperbilirubinemia. He developed acute withdrawal, DTs. Admitted to stepdown unit and started on Precedex infusion.  Pertinent  Medical History  Anxiety  Depression  Back Pain  Drug Overdose  Mental Disorder    Significant Hospital Events: Including procedures, antibiotic start and stop dates in addition to other pertinent events   5/27 Admit with ETOH withdrawal  5/31 off precedex, to floor 6/1 - As librium tapered, pt developed increased agitation today requiring 10 mg IV ativan early this afternoon. Kept insisting he wanted to go home. Father said he planned to be out of town beginning tomorrow and was hopeful he'd stick around here to ride out his encephalopathy. +8L volume overload 6/2 - ains on precedex gtt. Bilirubin improving. AST/ALT worse. INR not checked. Afebrile. Phenobarb protcol added. Did not cooperate for PICC line 6/3- LFT/Bili/INR all better. Remains in precedex and phenobarb protocol. More hypoxemic 6L [place]. And lasix given - >resp better per RN but still on 6L [place] and Pulse ox 95%.   Low grade temp 100.1 (new). Ammonia 54. RUQ [place] - fatty liver. No cirrhosis.   Improved volume overload to +4.8L 6/4 agitation , on precedex  6/6 weaning rpecedex \  6/7 off of precedex.   Interim History / Subjective:  Had a good night. Feeling down about being in the hospital but overall much better   WBC incr to 18 today  Afebrile   Objective   Blood pressure (!) 153/83, pulse (!) 110, temperature 98.4 F (36.9 C), temperature source Oral, resp. rate (!) 32, height 6\' 1"  (1.854 m), weight 129.8 kg,  SpO2 98 %.        Intake/Output Summary (Last 24 hours) at 12/03/2021 1007 Last data filed at 12/03/2021 0100 Gross per 24 hour  Intake 532.18 ml  Output 1450 ml  Net -917.82 ml   Filed Weights   11/22/21 1410 11/22/21 2241  Weight: 127 kg 129.8 kg    Examination: General: Ill appearing adult M NAD  HENT: NCAT pink mm  Lungs: even unlabored on RA  Cardiovascular: rrr s1s2 no rgm  Abdomen: soft ndnt  Extremities: no acute deformity  Skin: jaundice  Neuro: AAOx3 following commands  GU: defer   Resolved Hospital Problem list     Assessment & Plan:    Acute metabolic encephalopathy due to EtOH withdrawals with Dts, hyperammonemia and ICU delirium -- improving  -9d into hx course, would suspect withdrawals are completed  P -delirium precautions -will convert phenobarb to PO and taper  - PRN versed, PRN haldol.  -micronutrient supplementation   Acute respiratory failure with hypoxia, improving  HCAP  Atelectasis Volume overload  P -cont cefepime -IS, flutter, mobility   Acute alcoholic hepatitis  Possible Hep C infection  Hyperammonemia Hyperbilirubinemia  Alcoholic hepatitis Possible Hep C infection  -Maddreys 42.9 on admit. No cirrhosis on RUQ [place] -Hep A ab IgM non-reactive. Hep B non reactive  -HCV ab is reactive. A quant days earlier was undetectable. Pt now reporting he was told he has hep C but did not start tx P -lactulose, xifaxan, solumedrol -trend LFT, INR -a repeat HCV RNA, quant is pending.  HFrEF -EF 45-50% akinesis anterior/anteroseptal wall  P -diurese -will need cards consult this admit -- agitation has been limiting appropriateness   Anemia of critical illness  -trend PRN   Hypomagnesemia  Hypokalemia P -replace. Cont to trend   Dispo:  Will ask [facility] to take over care 6/8 ccm off. Stepdown status.  Progressing -- will consult PT/OT/SLP   Best Practice (right click and "Reselect all SmartList Selections" daily)   Diet/type: NPO  -- SLP DVT prophylaxis: LMWH GI prophylaxis: N/A Lines: Central line -- RUE PICC Foley:  N/A Code Status:  full code Last date of multidisciplinary goals of care discussion [regular updates to father]  Labs   CBC: Recent Labs  Lab 11/29/21 0307 11/30/21 0305 12/01/21 0310 12/02/21 0500 12/03/21 0457  WBC 11.6* 13.9* 13.5* 15.3* 18.9*  NEUTROABS  --   --   --  9.0*  --   HGB 11.8* 13.1 13.8 12.5* 11.9*  HCT 35.3* 39.4 41.8 36.7* 35.4*  MCV 100.6* 102.1* 102.5* 101.7* 102.3*  PLT 202 207 231 229 [id number]    Basic Metabolic Panel: Recent Labs  Lab 11/28/21 0252 11/29/21 0307 11/30/21 0305 12/01/21 0310 12/02/21 0500 12/03/21 0457  NA 136 136 138 138 138 137  K 4.2 3.7 4.2 3.9 3.8 3.3*  CL 111 107 107 105 103 103  CO2 21* 24 23 25 26 26   GLUCOSE 123* 101* 120* 112* 96 117*  BUN 23* 17 17 16  21* 19  CREATININE 0.90 0.76 0.71 0.67 0.88 0.93  CALCIUM 8.6* 8.3* 8.4* 8.4* 8.2* 8.3*  MG 2.1 1.7 2.0 1.7 1.7  --   PHOS 3.5 3.7 4.0 3.9 3.5  --    GFR: Estimated Creatinine Clearance: 155.2 mL/min (by C-G formula based on SCr of 0.93 mg/dL). Recent Labs  Lab 11/29/21 0307 11/30/21 0305 12/01/21 0310 12/02/21 0500 12/03/21 0457  PROCALCITON 1.76 1.33 1.19  --   --   WBC 11.6* 13.9* 13.5* 15.3* 18.9*  LATICACIDVEN 0.8 1.0  --   --   --     Liver Function Tests: Recent Labs  Lab 11/27/21 0544 11/28/21 0252 11/29/21 0307 11/30/21 0305 12/01/21 0310  AST 187* 207* 203* 224* 232*  ALT 62* 78* 83* 94* 109*  ALKPHOS 122 125 117 126 119  BILITOT 14.3* 13.2* 11.1* 11.2* 9.6*  PROT 5.9* 6.0* 5.8* 6.2* 6.6  ALBUMIN 2.2* 2.2* 2.0* 2.0* 2.1*   Recent Labs  Lab 11/26/21 1400  LIPASE 378*   Recent Labs  Lab 11/29/21 0307  AMMONIA 54*    ABG    Component Value Date/Time   PHART 7.38 11/28/2021 0900   PCO2ART 38 11/28/2021 0900   PO2ART 74 (L) 11/28/2021 0900   HCO3 22.5 11/28/2021 0900   TCO2 26 10/30/2010 0116   ACIDBASEDEF 2.3 (H) 11/28/2021 0900   O2SAT  97.4 11/28/2021 0900     Coagulation Profile: Recent Labs  Lab 11/29/21 0307 11/30/21 0305 12/01/21 0310  INR 1.4* 1.4* 1.3*    Cardiac Enzymes: No results for input(s): CKTOTAL, CKMB, CKMBINDEX, TROPONINI in the last 168 hours.  HbA1C: Hgb A1c MFr Bld  Date/Time Value Ref Range Status  04/18/2020 06:20 AM 5.2 4.8 - 5.6 % Final    Comment:    (NOTE)         Prediabetes: 5.7 - 6.4         Diabetes: >6.4         Glycemic control for adults with diabetes: <7.0     CBG:  No results for input(s): GLUCAP in the last 168 hours.  CCT: n/a    Eliseo Gum MSN, AGACNP-BC [place] for pager  12/03/2021, 10:33 AM

## 2021-12-03 NOTE — Progress Notes (Signed)
Patient requesting Miralax eLink Physician-Brief Progress Note Patient Name: Larry Adams DOB: 1985/12/19 MRN: [id number]   Date of Service  12/03/2021  HPI/Events of Note  Patient requesting Miralax.  eICU Interventions  Miralax ordered.        Kerry Kass Nikcole Eischeid 12/03/2021, 6:13 AM

## 2021-12-03 NOTE — Evaluation (Signed)
Physical Therapy Evaluation Patient Details Name: Larry Adams MRN: [id number] DOB: 26-Nov-1985 Today's Date: 12/03/2021  History of Present Illness  36 year old man with a history of depression, alcohol use and dependence.  He was admitted with acute alcoholic hepatitis 5/27. hospital course complicated by acute alcohol withdrawal requiring Precedex infusion, then phenobarbital support and taper.  Precedex weaned to off 6/6, Weaned to room air  Clinical Impression  Pt admitted with above diagnosis.  Anticipate steady progress in acute setting. Doubt pt will need f/u post acute but will continue to assess for needs.   Pt currently with functional limitations due to the deficits listed below (see PT Problem List). Pt will benefit from skilled PT to increase their independence and safety with mobility to allow discharge to the venue listed below.          Recommendations for follow up therapy are one component of a multi-disciplinary discharge planning process, led by the attending physician.  Recommendations may be updated based on patient status, additional functional criteria and insurance authorization.  Follow Up Recommendations No PT follow up    Assistance Recommended at Discharge Intermittent Supervision/Assistance  Patient can return home with the following  Help with stairs or ramp for entrance;Assist for transportation    Equipment Recommendations Other (comment) (TBD)  Recommendations for Other Services       Functional Status Assessment Patient has had a recent decline in their functional status and demonstrates the ability to make significant improvements in function in a reasonable and predictable amount of time.     Precautions / Restrictions Precautions Precautions: Fall Restrictions Weight Bearing Restrictions: No      Mobility  Bed Mobility Overal bed mobility: Needs Assistance Bed Mobility: Sit to Supine     Supine to sit: Min assist, Mod assist Sit to  supine: Min assist   General bed mobility comments: assist to progress LEs off bed and elevate trunk    Transfers Overall transfer level: Needs assistance Equipment used: Rolling walker (2 wheels), None Transfers: Sit to/from Stand Sit to Stand: Min assist, Mod assist           General transfer comment: assist to rise and steady, to control excessive anterior wt shift. cues for safety. pt is impulsive    Ambulation/Gait Ambulation/Gait assistance: Min assist, Mod assist Gait Distance (Feet): 15 Feet Assistive device: Rolling walker (2 wheels), None Gait Pattern/deviations: Step-through pattern, Wide base of support, Staggering left, Staggering right Gait velocity: decr     General Gait Details: amb to bathroom without device (none available)  with mod assist to balance and cues for direction and safety. amb from bathroom back to bed with min assist and RW, stability improved with RW support. RR 21-33, HR in 80s during PT  Stairs            Wheelchair Mobility    Modified Rankin (Stroke Patients Only)       Balance Overall balance assessment: Needs assistance Sitting-balance support: Single extremity supported, Feet supported, No upper extremity supported Sitting balance-Leahy Scale: Poor Sitting balance - Comments: heavy anterior lean Postural control: Other (comment) Standing balance support: During functional activity, No upper extremity supported, Bilateral upper extremity supported, Single extremity supported Standing balance-Leahy Scale: Poor Standing balance comment: reliant on at least unilateral UE support and intermittent external support                             Pertinent Vitals/Pain Pain Assessment  Pain Assessment: No/denies pain    Home Living Family/patient expects to be discharged to:: Private residence Living Arrangements: Parent (lives with his dad) Available Help at Discharge: Family Type of Home: House           Home  Equipment: None Additional Comments: works in Warehouse manager Prior Level of Function : Independent/Modified Independent                     Higher education careers adviser        Extremity/Trunk Assessment   Upper Extremity Assessment Upper Extremity Assessment: Defer to OT evaluation    Lower Extremity Assessment Lower Extremity Assessment: Overall WFL for tasks assessed       Communication   Communication: No difficulties  Cognition Arousal/Alertness: Awake/alert Behavior During Therapy: Impulsive Overall Cognitive Status: Impaired/Different from baseline Area of Impairment: Problem solving, Safety/judgement                         Safety/Judgement: Decreased awareness of safety, Decreased awareness of deficits   Problem Solving: Difficulty sequencing, Requires verbal cues          General Comments      Exercises     Assessment/Plan    PT Assessment Patient needs continued PT services  PT Problem List Decreased mobility;Decreased activity tolerance;Decreased balance;Decreased coordination;Decreased safety awareness       PT Treatment Interventions DME instruction;Therapeutic exercise;Gait training;Functional mobility training;Therapeutic activities;Patient/family education    PT Goals (Current goals can be found in the Care Plan section)  Acute Rehab PT Goals Patient Stated Goal: get out of the hospital PT Goal Formulation: With patient Time For Goal Achievement: 12/17/21 Potential to Achieve Goals: Good    Frequency Min 3X/week     Co-evaluation               AM-PAC PT "6 Clicks" Mobility  Outcome Measure Help needed turning from your back to your side while in a flat bed without using bedrails?: A Little Help needed moving from lying on your back to sitting on the side of a flat bed without using bedrails?: A Lot Help needed moving to and from a bed to a chair (including a wheelchair)?: A Lot Help needed standing up from  a chair using your arms (e.g., wheelchair or bedside chair)?: A Lot Help needed to walk in hospital room?: A Lot Help needed climbing 3-5 steps with a railing? : Total 6 Click Score: 12    End of Session Equipment Utilized During Treatment: Gait belt Activity Tolerance: Patient tolerated treatment well Patient left: with call bell/phone within reach;in bed;with bed alarm set Nurse Communication: Mobility status PT Visit Diagnosis: Other abnormalities of gait and mobility (R26.89);Difficulty in walking, not elsewhere classified (R26.2)    Time: 0981-1914 PT Time Calculation (min) (ACUTE ONLY): 21 min   Charges:   PT Evaluation $PT Eval Low Complexity: 1 Low          Esha Fincher, PT  Acute Rehab Dept ([facility]) [phone] Pager [phone]  12/03/2021   [facility] 12/03/2021, 4:14 PM

## 2021-12-03 NOTE — Progress Notes (Signed)
[place] Progress Note Patient Name: Larry Adams DOB: [birth year] MRN: [id number]   Date of Service  12/03/2021  HPI/Events of Note  K+ 3.3  eICU Interventions  KCL 40 meq PO Q 4 hours x 2 doses.        [name] 12/03/2021, 5:56 AM

## 2021-12-04 ENCOUNTER — Other Ambulatory Visit (HOSPITAL_COMMUNITY): Payer: Self-pay

## 2021-12-04 DIAGNOSIS — F10239 Alcohol dependence with withdrawal, unspecified: Secondary | ICD-10-CM | POA: Diagnosis not present

## 2021-12-04 DIAGNOSIS — I5021 Acute systolic (congestive) heart failure: Secondary | ICD-10-CM

## 2021-12-04 LAB — PHOSPHORUS: Phosphorus: 1.8 mg/dL — ABNORMAL LOW (ref 2.5–4.6)

## 2021-12-04 MED ORDER — TRAZODONE HCL 50 MG PO TABS: 50.0000 mg | ORAL_TABLET | Freq: Every evening | ORAL | Status: DC | PRN

## 2021-12-04 MED ORDER — HYDROCORTISONE (PERIANAL) 2.5 % EX CREA
1.0000 "application " | TOPICAL_CREAM | Freq: Four times a day (QID) | CUTANEOUS | Status: DC | PRN
  Filled 2021-12-04: qty 28.35

## 2021-12-04 MED ORDER — METOPROLOL TARTRATE 5 MG/5ML IV SOLN: 5.0000 mg | INTRAVENOUS | Status: DC | PRN

## 2021-12-04 MED ORDER — CARVEDILOL 3.125 MG PO TABS
3.1250 mg | ORAL_TABLET | Freq: Two times a day (BID) | ORAL | Status: DC
  Administered 2021-12-04 – 2021-12-06 (×5): 3.125 mg via ORAL
  Filled 2021-12-04 (×5): qty 1

## 2021-12-04 MED ORDER — SALINE SPRAY 0.65 % NA SOLN
1.0000 | NASAL | Status: DC | PRN
  Filled 2021-12-04: qty 44

## 2021-12-04 MED ORDER — LOSARTAN POTASSIUM 50 MG PO TABS
25.0000 mg | ORAL_TABLET | Freq: Every day | ORAL | Status: DC
  Administered 2021-12-04 – 2021-12-05 (×2): 25 mg via ORAL
  Filled 2021-12-04 (×2): qty 1

## 2021-12-04 MED ORDER — HALOPERIDOL LACTATE 5 MG/ML IJ SOLN
5.0000 mg | Freq: Four times a day (QID) | INTRAMUSCULAR | Status: DC | PRN
Start: 2021-12-04 — End: 2021-12-05

## 2021-12-04 MED ORDER — PREDNISOLONE 5 MG PO TABS
40.0000 mg | ORAL_TABLET | Freq: Every day | ORAL | Status: DC
  Administered 2021-12-04 – 2021-12-05 (×2): 40 mg via ORAL
  Filled 2021-12-04 (×3): qty 8

## 2021-12-04 MED ORDER — GUAIFENESIN 100 MG/5ML PO LIQD: 5.0000 mL | ORAL | Status: DC | PRN

## 2021-12-04 MED ORDER — POTASSIUM CHLORIDE 10 MEQ/50ML IV SOLN
10.0000 meq | INTRAVENOUS | Status: AC
  Administered 2021-12-04 (×4): 10 meq via INTRAVENOUS
  Filled 2021-12-04 (×4): qty 50

## 2021-12-04 MED ORDER — POLYVINYL ALCOHOL 1.4 % OP SOLN
1.0000 [drp] | OPHTHALMIC | Status: DC | PRN
  Filled 2021-12-04: qty 15

## 2021-12-04 MED ORDER — PREDNISOLONE 5 MG PO TABS
20.0000 mg | ORAL_TABLET | Freq: Every day | ORAL | Status: DC
  Filled 2021-12-04: qty 4

## 2021-12-04 MED ORDER — PHENOL 1.4 % MT LIQD: 1.0000 | OROMUCOSAL | Status: DC | PRN

## 2021-12-04 MED ORDER — POTASSIUM CHLORIDE CRYS ER 20 MEQ PO TBCR
20.0000 meq | EXTENDED_RELEASE_TABLET | ORAL | Status: AC
  Administered 2021-12-04 (×2): 20 meq via ORAL
  Filled 2021-12-04 (×2): qty 1

## 2021-12-04 MED ORDER — LIP MEDEX EX OINT: 1.0000 "application " | TOPICAL_OINTMENT | CUTANEOUS | Status: DC | PRN

## 2021-12-04 MED ORDER — MUSCLE RUB 10-15 % EX CREA
1.0000 "application " | TOPICAL_CREAM | CUTANEOUS | Status: DC | PRN
  Filled 2021-12-04: qty 85

## 2021-12-04 MED ORDER — IPRATROPIUM-ALBUTEROL 0.5-2.5 (3) MG/3ML IN SOLN: 3.0000 mL | RESPIRATORY_TRACT | Status: DC | PRN

## 2021-12-04 MED ORDER — HYDROCORTISONE 1 % EX CREA
1.0000 "application " | TOPICAL_CREAM | Freq: Three times a day (TID) | CUTANEOUS | Status: DC | PRN
  Filled 2021-12-04: qty 28

## 2021-12-04 MED ORDER — LORATADINE 10 MG PO TABS: 10.0000 mg | ORAL_TABLET | Freq: Every day | ORAL | Status: DC | PRN

## 2021-12-04 MED ORDER — SPIRONOLACTONE 12.5 MG HALF TABLET
12.5000 mg | ORAL_TABLET | Freq: Every day | ORAL | Status: DC
  Administered 2021-12-04 – 2021-12-05 (×2): 12.5 mg via ORAL
  Filled 2021-12-04 (×3): qty 1

## 2021-12-04 MED ORDER — HYDRALAZINE HCL 20 MG/ML IJ SOLN: 10.0000 mg | INTRAMUSCULAR | Status: DC | PRN

## 2021-12-04 MED ORDER — ACETAMINOPHEN 325 MG PO TABS
650.0000 mg | ORAL_TABLET | Freq: Four times a day (QID) | ORAL | Status: DC | PRN
  Administered 2021-12-04 – 2021-12-05 (×2): 650 mg via ORAL
  Filled 2021-12-04 (×2): qty 2

## 2021-12-04 MED ORDER — SENNOSIDES-DOCUSATE SODIUM 8.6-50 MG PO TABS: 1.0000 | ORAL_TABLET | Freq: Every evening | ORAL | Status: DC | PRN

## 2021-12-04 MED ORDER — OXYCODONE HCL 5 MG PO TABS
5.0000 mg | ORAL_TABLET | ORAL | Status: DC | PRN
  Administered 2021-12-05 – 2021-12-06 (×2): 5 mg via ORAL
  Filled 2021-12-04 (×2): qty 1

## 2021-12-04 MED ORDER — ALUM & MAG HYDROXIDE-SIMETH 200-200-20 MG/5ML PO SUSP: 30.0000 mL | ORAL | Status: DC | PRN

## 2021-12-04 NOTE — Progress Notes (Signed)
RT has attempted instruct the Pt on the flutter but the Pt is very sleepy. RT will continue to monitor.

## 2021-12-04 NOTE — Progress Notes (Signed)
PROGRESS NOTE    Larry Adams  KGS:[id number] DOB: 05/21/1986 DOA: 11/22/2021 PCP: Patient, No Pcp Per (Inactive)   Brief Narrative:  36 year old male with alcohol dependence, depression, chronic back pain, drug overuse, anxiety, depression admitted to the hospital vomiting with concerns of alcohol withdrawal.  Work-up was consistent with alcoholic hepatitis and due to withdrawals he was admitted to the ICU and started on Precedex.  He was transferred back to the floor but again due to concerns of alcohol withdrawal he was transferred to the ICU for more Precedex drip.  He also received Librium taper and eventually phenobarbital was ordered.  LFTs slowly improved.  Initially appeared to be significantly volume overloaded requiring Lasix.  Echocardiogram showed EF of 45% with akinesis to anterior/anteroseptal wall.   Assessment & Plan:  Principal Problem:   Alcohol dependence with withdrawal with complication (HCC) Active Problems:   Alcoholic hepatitis without ascites   Hyperbilirubinemia   Hypokalemia   Hypomagnesemia   Tobacco use   Anxiety and depression     Assessment and Plan: Acute metabolic encephalopathy with alcohol withdrawal Transaminitis with elevated total bilirubin - Currently on phenobarbital taper protocol, last day today - Multivitamin, folic acid, thiamine - Right upper quadrant ultrasound-shows gallbladder sludge and fatty infiltration  Acute hypoxic respiratory distress - Multifactorial-HCAP, atelectasis, volume overload - On IV cefepime, completed 5-day course  Healthcare associated pneumonia - IV antibiotics.  Bronchodilators, I-S/flutter valve  Acute congestive heart failure with reduced ejection fraction, EF 45% - Echocardiogram shows EF 45% with anteroseptal wall/anterior wall akinesis. - IV diuretics, transition to p.o. tomorrow - Seen by cardiology team-started on Coreg, losartan and Aldactone.  Acute alcoholic hepatitis History of hepatitis C  infection - Hep A, hep B negative.  Hep C antibody is reactive - Previously has not been treated for hepatitis C - HCVRNA-not detected.  Advised to follow-up outpatient - Currently on lactulose twice daily, rifaximin and Solu-Medrol 40 mg IV daily started 6/3.  We will plan steroid therapy for 28 days -Discriminant function score greater than 32  Frequent agitation - On Haldol 5 mg every 6 hours, will make this as needed  Hypokalemia/hypomagnesemia - Replete as necessary. Check Phos  Anemia of chronic disease, macrocytosis - Hemoglobin around baseline of 12.  No active signs of bleeding.   DVT prophylaxis: Lovenox Code Status: Full code Family Communication:    Status is: Inpatient Remains inpatient appropriate because: Doing little better still getting tachycardic.  Ongoing medication adjustment by cardiology team.  Hopefully discharge in next 48 hours    Subjective: Seen and examined at bedside this morning.  He was ambulating with therapy this morning but his heart rate went from 9220s with ambulation.  Appears quite deconditioned.  Tells me that he does want to quit drinking.    Examination:  General exam: Appears calm and comfortable  Respiratory system: Clear to auscultation. Respiratory effort normal. Cardiovascular system: Sinus tachycardia, no murmurs.  No edema. Gastrointestinal system: Abdomen is nondistended, soft and nontender. No organomegaly or masses felt. Normal bowel sounds heard. Central nervous system: Alert and oriented. No focal neurological deficits. Extremities: Symmetric 5 x 5 power. Skin: Jaundiced Psychiatry: Judgement and insight appear normal. Mood & affect appropriate.     Objective: Vitals:   12/03/21 2200 12/03/21 2300 12/04/21 0000 12/04/21 0300  BP: (!) 143/91 (!) 145/62 (!) 133/54   Pulse: (!) 107 88 80   Resp: (!) 27 (!) 27 (!) 24   Temp:  98.8 F (37.1 C)  98.7 F (  37.1 C)  TempSrc:  Oral  Oral  SpO2: 96% 95% 96%   Weight:       Height:        Intake/Output Summary (Last 24 hours) at 12/04/2021 0726 Last data filed at 12/04/2021 2585 Gross per 24 hour  Intake 1121.53 ml  Output 3350 ml  Net -2228.47 ml   Filed Weights   11/22/21 1410 11/22/21 2241  Weight: 127 kg 129.8 kg     Data Reviewed:   CBC: Recent Labs  Lab 11/29/21 0307 11/30/21 0305 12/01/21 0310 12/02/21 0500 12/03/21 0457  WBC 11.6* 13.9* 13.5* 15.3* 18.9*  NEUTROABS  --   --   --  9.0*  --   HGB 11.8* 13.1 13.8 12.5* 11.9*  HCT 35.3* 39.4 41.8 36.7* 35.4*  MCV 100.6* 102.1* 102.5* 101.7* 102.3*  PLT 202 207 231 229 250   Basic Metabolic Panel: Recent Labs  Lab 11/28/21 0252 11/29/21 0307 11/30/21 0305 12/01/21 0310 12/02/21 0500 12/03/21 0457  NA 136 136 138 138 138 137  K 4.2 3.7 4.2 3.9 3.8 3.3*  CL 111 107 107 105 103 103  CO2 21* 24 23 25 26 26   GLUCOSE 123* 101* 120* 112* 96 117*  BUN 23* 17 17 16  21* 19  CREATININE 0.90 0.76 0.71 0.67 0.88 0.93  CALCIUM 8.6* 8.3* 8.4* 8.4* 8.2* 8.3*  MG 2.1 1.7 2.0 1.7 1.7  --   PHOS 3.5 3.7 4.0 3.9 3.5  --    GFR: Estimated Creatinine Clearance: 155.2 mL/min (by C-G formula based on SCr of 0.93 mg/dL). Liver Function Tests: Recent Labs  Lab 11/28/21 0252 11/29/21 0307 11/30/21 0305 12/01/21 0310  AST 207* 203* 224* 232*  ALT 78* 83* 94* 109*  ALKPHOS 125 117 126 119  BILITOT 13.2* 11.1* 11.2* 9.6*  PROT 6.0* 5.8* 6.2* 6.6  ALBUMIN 2.2* 2.0* 2.0* 2.1*   No results for input(s): "LIPASE", "AMYLASE" in the last 168 hours. Recent Labs  Lab 11/29/21 0307  AMMONIA 54*   Coagulation Profile: Recent Labs  Lab 11/29/21 0307 11/30/21 0305 12/01/21 0310  INR 1.4* 1.4* 1.3*   Cardiac Enzymes: No results for input(s): "CKTOTAL", "CKMB", "CKMBINDEX", "TROPONINI" in the last 168 hours. BNP (last 3 results) No results for input(s): "PROBNP" in the last 8760 hours. HbA1C: No results for input(s): "HGBA1C" in the last 72 hours. CBG: No results for input(s): "GLUCAP" in  the last 168 hours. Lipid Profile: No results for input(s): "CHOL", "HDL", "LDLCALC", "TRIG", "CHOLHDL", "LDLDIRECT" in the last 72 hours. Thyroid Function Tests: No results for input(s): "TSH", "T4TOTAL", "FREET4", "T3FREE", "THYROIDAB" in the last 72 hours. Anemia Panel: No results for input(s): "VITAMINB12", "FOLATE", "FERRITIN", "TIBC", "IRON", "RETICCTPCT" in the last 72 hours. Sepsis Labs: Recent Labs  Lab 11/29/21 0307 11/30/21 0305 12/01/21 0310  PROCALCITON 1.76 1.33 1.19  LATICACIDVEN 0.8 1.0  --     No results found for this or any previous visit (from the past 240 hour(s)).       Radiology Studies: No results found.      Scheduled Meds:  chlorhexidine  15 mL Mouth Rinse BID   Chlorhexidine Gluconate Cloth  6 each Topical Daily   enoxaparin (LOVENOX) injection  40 mg Subcutaneous Q24H   folic acid  1 mg Intravenous Daily   furosemide  40 mg Intravenous Daily   haloperidol lactate  5 mg Intravenous Q6H   lactulose  10 g Oral BID   lactulose  300 mL Rectal BID   mouth  rinse  15 mL Mouth Rinse BID   mouth rinse  15 mL Mouth Rinse q12n4p   methylPREDNISolone (SOLU-MEDROL) injection  20 mg Intravenous Q24H   multivitamin with minerals  1 tablet Oral Daily   nicotine  21 mg Transdermal Daily   phenobarbital  64.8 mg Oral Q8H   polyethylene glycol  17 g Oral Daily   potassium chloride  20 mEq Oral Q4H   rifaximin  550 mg Oral BID   sodium chloride flush  10-40 mL Intracatheter Q12H   sodium chloride flush  3 mL Intravenous Q12H   thiamine  100 mg Oral Daily   Or   thiamine  100 mg Intravenous Daily   Continuous Infusions:  sodium chloride Stopped (11/30/21 2300)   ceFEPime (MAXIPIME) IV Stopped (12/04/21 4627)   dextrose 5% lactated ringers Stopped (12/01/21 1137)   potassium chloride 10 mEq (12/04/21 0702)     LOS: 12 days   Time spent= 35 mins    Burnie Hank Joline Maxcy, MD [facility]  If 7PM-7AM, please contact night-coverage  12/04/2021,  7:26 AM

## 2021-12-04 NOTE — Evaluation (Signed)
Occupational Therapy Evaluation Patient Details Name: Larry Adams MRN: [id number] DOB: March 22, 1986 Today's Date: 12/04/2021   History of Present Illness 36 year old man with a history of depression, alcohol use and dependence.  He was admitted with acute alcoholic hepatitis [id number]. hospital course complicated by acute alcohol withdrawal requiring Precedex infusion, then phenobarbital support and taper.  Precedex weaned to off 6/6, Weaned to room air   Clinical Impression   Patient is currently requiring assistance with ADLs including Min guard assist with Lower body ADLs, setup/supervision assist with Upper body ADLs,  as well as supervision assist with functional transfers to toilet and Min guard with t/f to tub/shower based on general assessment.  Current level of function is below patient's typical baseline.  During this evaluation, patient was limited by generalized weakness, impaired activity tolerance, and cognitive deficits with decreased awareness of current impairments including elevated HR to 120 bpm after 100' ambulation, all of which has the potential to impact patient's safety and independence during functional mobility, as well as performance for ADLs.  Patient lives at home, with his father who is able to provide 24/7 supervision and assistance.  Patient demonstrates good rehab potential, and should benefit from continued skilled occupational therapy services while in acute care to maximize safety, independence and quality of life at home.  No post-acute OT needs anticipated but pt would benefit from verbalizing a solid sobriety plan.  ?    Recommendations for follow up therapy are one component of a multi-disciplinary discharge planning process, led by the attending physician.  Recommendations may be updated based on patient status, additional functional criteria and insurance authorization.   Follow Up Recommendations  No OT follow up    Assistance Recommended at Discharge PRN   Patient can return home with the following A little help with walking and/or transfers;A little help with bathing/dressing/bathroom;Direct supervision/assist for medications management    Functional Status Assessment  Patient has had a recent decline in their functional status and demonstrates the ability to make significant improvements in function in a reasonable and predictable amount of time.  Equipment Recommendations  None recommended by OT    Recommendations for Other Services       Precautions / Restrictions Precautions Precautions: Fall Restrictions Weight Bearing Restrictions: No      Mobility Bed Mobility Overal bed mobility: Modified Independent                  Transfers                          Balance Overall balance assessment: Needs assistance   Sitting balance-Leahy Scale: Good     Standing balance support: During functional activity, No upper extremity supported Standing balance-Leahy Scale: Fair                             ADL either performed or assessed with clinical judgement   ADL Overall ADL's : Needs assistance/impaired     Grooming: Supervision/safety;Wash/dry hands;Standing   Upper Body Bathing: Supervision/ safety;Standing   Lower Body Bathing: Min guard;Sitting/lateral leans;Sit to/from stand   Upper Body Dressing : Set up;Sitting Upper Body Dressing Details (indicate cue type and reason): Assist for lines only. Lower Body Dressing: Min guard;Sit to/from stand Lower Body Dressing Details (indicate cue type and reason): Able to doff and don socks in recliner with supervision. Toilet Transfer: Copy Details (indicate cue type and reason): Pt  stood from EOB with supervision and began ambulating without AD. Pt using wide BOS and ambuted 100' with supervision. HR 120 once back to room. MD in room and aware. Toileting- Clothing Manipulation and Hygiene:  Supervision/safety;Sitting/lateral lean   Tub/ Banker: Min guard;Tub transfer Tub/Shower Transfer Details (indicate cue type and reason): based on general assessment but not observed Functional mobility during ADLs: Supervision/safety;Min guard       Vision Patient Visual Report: No change from baseline Vision Assessment?: No apparent visual deficits     Perception     Praxis      Pertinent Vitals/Pain Pain Assessment Pain Assessment: No/denies pain     Hand Dominance Right   Extremity/Trunk Assessment Upper Extremity Assessment Upper Extremity Assessment: Overall WFL for tasks assessed   Lower Extremity Assessment Lower Extremity Assessment: Overall WFL for tasks assessed   Cervical / Trunk Assessment Cervical / Trunk Assessment: Normal   Communication Communication Communication: No difficulties   Cognition Arousal/Alertness: Awake/alert Behavior During Therapy: Impulsive Overall Cognitive Status: Impaired/Different from baseline Area of Impairment: Problem solving, Safety/judgement                         Safety/Judgement: Decreased awareness of deficits   Problem Solving: Requires verbal cues General Comments: Ox4     General Comments       Exercises     Shoulder Instructions      Home Living Family/patient expects to be discharged to:: Private residence (Pt giving different information than he did the PT yesterday, likely pt was more confused during PT Evaluation.) Living Arrangements: Parent Available Help at Discharge: Family;Available 24 hours/day (Pt's father) Type of Home: Apartment Home Access: Stairs to enter [employer] of Steps: Full flight. Entrance Stairs-Rails: Can reach both;Left;Right Home Layout: Multi-level Alternate Level Stairs-Number of Steps: 1 flight of stairs to 1 story apartment. Alternate Level Stairs-Rails: Right;Left;Can reach both Bathroom Shower/Tub: Teacher, early years/pre:  Standard     Home Equipment: None   Additional Comments: works in Architect      Prior Functioning/Environment Prior Level of Function : Independent/Modified Independent;Driving;Working/employed                        OT Problem List: Impaired balance (sitting and/or standing);Decreased activity tolerance;Decreased cognition      OT Treatment/Interventions: Self-care/ADL training;Therapeutic activities;Cognitive remediation/compensation;DME and/or AE instruction;Patient/family education;Balance training    OT Goals(Current goals can be found in the care plan section) Acute Rehab OT Goals Patient Stated Goal: To go home. Pt does not want to attend AA and reports a sober friend and father for support system. OT Goal Formulation: With patient Time For Goal Achievement: 12/18/21 Potential to Achieve Goals: Good ADL Goals Pt Will Perform Toileting - Clothing Manipulation and hygiene: Independently Pt Will Perform Tub/Shower Transfer: with supervision;ambulating;Tub transfer Additional ADL Goal #1: Pt will engage in 10 min of standing functional activities such as making a bed without loss of sitting or standing balance, in order to demonstrate improved activity tolerance and balance needed to perform ADLs safely at home. Additional ADL Goal #2: Pt will score at least a 14 on the 30 second sit to stand test in order to demonstrate a decreased risk of falling during dynamic standing ADLs.  OT Frequency: Min 2X/week    Co-evaluation              AM-PAC OT "6 Clicks" Daily Activity     Outcome  Measure Help from another person eating meals?: None Help from another person taking care of personal grooming?: None Help from another person toileting, which includes using toliet, bedpan, or urinal?: A Little Help from another person bathing (including washing, rinsing, drying)?: A Little Help from another person to put on and taking off regular upper body clothing?: None Help  from another person to put on and taking off regular lower body clothing?: A Little 6 Click Score: 21   End of Session Nurse Communication: Mobility status  Activity Tolerance: Patient tolerated treatment well Patient left: in chair;with call bell/phone within reach;with chair alarm set;with nursing/sitter in room  OT Visit Diagnosis: Unsteadiness on feet (R26.81)                Time: [id number] OT Time Calculation (min): 27 min Charges:  OT General Charges $OT Visit: 1 Visit OT Evaluation $OT Eval Low Complexity: 1 Low OT Treatments $Therapeutic Activity: 8-22 mins  Anderson Malta, OT Acute Rehab Services Office: [phone] 12/04/2021  Julien Girt 12/04/2021, 11:41 AM

## 2021-12-04 NOTE — Progress Notes (Signed)
[facility] ADULT ICU REPLACEMENT PROTOCOL   The patient does apply for the [facility] Adult ICU Electrolyte Replacment Protocol based on the criteria listed below:   1.Exclusion criteria: TCTS patients, ECMO patients, and Dialysis patients 2. Is GFR >/= 30 ml/min? Yes.    Patient's GFR today is >60 3. Is SCr </= 2? Yes.   Patient's SCr is 0.93 mg/dL 4. Did SCr increase >/= 0.5 in 24 hours? No. 5.Pt's weight >40kg  Yes.   6. Abnormal electrolyte(s):   K 3.3  7. Electrolytes replaced per protocol 8.  Call MD STAT for K+ </= 2.5, Phos </= 1, or Mag </= 1 Physician:  S. Bobbye Morton R Jachai Okazaki 12/04/2021 5:52 AM

## 2021-12-04 NOTE — Consult Note (Addendum)
Cardiology Consultation:   Patient ID: Larry Adams MRN: [id number]; DOB: [birth year]  Admit date: 11/22/2021 Date of Consult: 12/04/2021  PCP:  Patient, No Pcp Per (Inactive)   [facility] Providers Cardiologist:  New to [facility] [place] Mood    Patient Profile:   Larry Adams is a 36 y.o. 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 Delton Coombes.   History of Present Illness:   Mr. Callicott with above PMH presented to ER on 11/22/21 with nausea, bilious emesis, diaphoresis, palpitation, tremor, and anxiety. He drinks large amount of ETOH at baseline, average 4-5 16 oz beer daily. He had stopped 11/21/21 abruptly due to lacking of money to purchase alcohol. He was found to have significant transaminitis, hyperbilirubinemia, electrolytes deficiency at admission. CT AP showed Gallbladder sludge without evidence of cholelithiasis or cholecystitis, severe hepatic steatosis, stable spontaneous portacaval shunt.  He was admitted to hospitalist then ICU subsequently for acute ETOH withdrawal with DT and alcoholic hepatitis. Course is complicated by acute hypoxic respiratory failure due to possible aspiration pneumonia, severe DT with agitated encephalopathy requiring Precedex sedation and phenobarbital taper. He was also concerned for volume overload. Echo was done 11/29/21 showed LVEF 45-50%, akinesis of the anterior/anteroseptal wall base-apex, global hypokinesis, normal diastolic parameters, nomral RV, no valvular disease. He was given diuresis with IV Lasix 40mg  daily. Cardiology is consulted today for further evaluation.   Upon encounter, patient states he was diagnosed with CHF in the past, but some doctor had told him that he does not have CHF.  He had experienced several months onset of BLE edema. He denied any significant SOB, chest pain or pressure, heart palpitation, syncope. He admits drinking large amount of ETOH and uses heroin in the past. He is considering quitting alcohol. He denied hx of MI. He feels improved today and is oriented to person/place/time, able to communicate simple needs. He denied any family hx of cardiac disease.   CXR serials had shown vascular congestion and diffuse interstitial infiltrates/edema. Labs today with hypokalemia 3.3, phos 1.8, otherwise unremarkable BMP. LFTs remains elevated since 6/5, bilirubin downtrending. Procal 1.19 6/5. CBC with leukocytosis 18900 and anemia with Hgb 11.9. HCV Ab +, RNA undetected.    Past Medical History:  Diagnosis Date   Anxiety    Back pain    Depression    Drug overdose    Mental disorder     Past Surgical History:  Procedure Laterality Date   HERNIA REPAIR       Home Medications:  Prior to Admission medications   Medication Sig Start Date End Date Taking? Authorizing Provider  chlordiazePOXIDE (LIBRIUM) 25 MG capsule Take 2 capsules (50mg ) by mouth three times daily for 1 day, then take 1-2 caps (25-50mg ) twice a day for 1 day, then 1-2 caps (25-50mg ) daily for 1 day. Patient not taking: Reported on 11/24/2021 09/12/21   11/26/2021, MD  furosemide (LASIX) 20 MG tablet Take 1 tablet (20 mg total) by mouth daily. 08/31/21   Derwood Kaplan, MD    Inpatient Medications: Scheduled Meds:  carvedilol  3.125 mg Oral BID WC   chlorhexidine  15 mL Mouth Rinse BID   Chlorhexidine Gluconate Cloth  6 each Topical Daily   enoxaparin (LOVENOX) injection  40 mg Subcutaneous Q24H   folic acid  1 mg Intravenous Daily   furosemide  40 mg Intravenous Daily   haloperidol lactate  5 mg Intravenous Q6H   lactulose  10 g Oral BID   losartan  25 mg Oral Daily   mouth rinse  15 mL Mouth Rinse BID   mouth rinse  15 mL Mouth Rinse q12n4p   multivitamin with minerals  1  tablet Oral Daily   nicotine  21 mg Transdermal Daily   phenobarbital  64.8 mg Oral Q8H   polyethylene glycol  17 g Oral Daily   prednisoLONE  40 mg Oral Daily   rifaximin  550 mg Oral BID   sodium chloride flush  10-40 mL Intracatheter Q12H   sodium chloride flush  3 mL Intravenous Q12H   thiamine  100 mg Oral Daily   Or   thiamine  100 mg Intravenous Daily   Continuous Infusions:  sodium chloride Stopped (11/30/21 2300)   ceFEPime (MAXIPIME) IV Stopped (12/04/21 7741)   dextrose 5% lactated ringers Stopped (12/01/21 1137)   potassium chloride 10 mEq (12/04/21 0840)   PRN Meds: sodium chloride, acetaminophen, alum & mag hydroxide-simeth, calcium carbonate, guaiFENesin, hydrALAZINE, hydrocortisone, hydrocortisone cream, ipratropium-albuterol, lip balm, loratadine, LORazepam, metoprolol tartrate, Muscle Rub, ondansetron **OR** ondansetron (ZOFRAN) IV, oxyCODONE, phenol, polyvinyl alcohol, senna-docusate, sodium chloride, sodium chloride flush, traZODone  Allergies:    Allergies  Allergen Reactions   Morphine And Related Hives   Vancomycin Itching and Other (See Comments)    "Red man syndrome"     Social History:   Social History   Socioeconomic History   Marital status: Single    Spouse name: Not on file   Number of children: Not on file   Years of education: Not on file   Highest education level: Not on file  Occupational History   Not on file  Tobacco Use   Smoking status: Every Day    Packs/day: 1.00    Types: Cigarettes   Smokeless tobacco: Former  Building services engineer Use: Former  Substance and Sexual Activity   Alcohol use: Yes    Alcohol/week: 6.0 standard drinks of alcohol    Types: 6 Cans of beer per week    Comment: PTA 25 oz of beer; drinking all day everyday for 2 months   Drug use: Not Currently    Types: IV, Fentanyl    Comment: Overdose on Heroin; IV Fentanyl use   Sexual activity: Yes  Other Topics Concern   Not on file  Social History Narrative    Not on file   Social Determinants of Health   Financial Resource Strain: Not on file  Food Insecurity: Not on file  Transportation Needs: Not on file  Physical Activity: Not on file  Stress: Not on file  Social Connections: Not on file  Intimate Partner Violence: Not on file    Family History:   Denied family hx of cardiac disease    ROS:  Constitutional: Denied fever, chills, malaise, night sweats Eyes: Denied vision change or loss Ears/Nose/Mouth/Throat: Denied ear ache, sore throat, coughing, sinus pain Cardiovascular: see HPI  Respiratory: see HPI  Gastrointestinal:see HPI  Genital/Urinary: Denied dysuria, hematuria, urinary frequency/urgency Musculoskeletal: Denied muscle ache, joint pain Skin: Denied rash, wound Neuro: Denied headache, dizziness, syncope Psych: history of depression/anxiety  Endocrine: Denied history of diabetes   Physical Exam/Data:   Vitals:   12/03/21 2200 12/03/21 2300 12/04/21 0000 12/04/21 0300  BP: (!) 143/91 (!) 145/62 (!) 133/54   Pulse: (!) 107 88  80   Resp: (!) 27 (!) 27 (!) 24   Temp:  98.8 F (37.1 C)  98.7 F (37.1 C)  TempSrc:  Oral  Oral  SpO2: 96% 95% 96%   Weight:      Height:        Intake/Output Summary (Last 24 hours) at 12/04/2021 1002 Last data filed at 12/04/2021 4098 Gross per 24 hour  Intake 761.53 ml  Output 3350 ml  Net -2588.47 ml      11/22/2021   10:41 PM 11/22/2021    2:10 PM 09/29/2021    8:13 PM  Last 3 Weights  Weight (lbs) 286 lb 2.5 oz 280 lb 293 lb 3.2 oz  Weight (kg) 129.8 kg 127.007 kg 132.995 kg     Body mass index is 37.75 kg/m.   Vitals:  Vitals:   12/04/21 0000 12/04/21 0300  BP: (!) 133/54   Pulse: 80   Resp: (!) 24   Temp:  98.7 F (37.1 C)  SpO2: 96%    General Appearance: In no apparent distress, sitting in chair, poor hygiene  HEENT: Normocephalic, atraumatic.  Neck: Supple, trachea midline, JVD up to mid neck while sitting at 90 degree  Cardiovascular: Regular rate and  rhythm, normal S1-S2,  no murmur Respiratory: Resting breathing unlabored, lungs sounds clear to auscultation bilaterally, no use of accessory muscles. On room air.  No wheezes, rales or rhonchi.   Gastrointestinal: Bowel sounds positive, abdomen soft Extremities: Able to move all extremities in bed without difficulty, 1+ pitting edema of BLE Musculoskeletal: Normal muscle bulk and tone Skin: Intact, warm, dry. Bruise noted of arms.  Neurologic: Alert, oriented to person, place and time. Fluent speech, no facial droop, no cognitive deficit Psychiatric: Normal affect. Mood is appropriate.    EKG:  The EKG was personally reviewed and demonstrates:  Sinus rhythm   Telemetry:  Telemetry was personally reviewed and demonstrates:  Sinus tachycardia 100-105s  Relevant CV Studies:  Echo from 11/29/21:  1. Akinesis of the anterior/anteroseptal wall base-apex. Left ventricular  ejection fraction, by estimation, is 45 to 50%. The left ventricle has  mildly decreased function. The left ventricle demonstrates global  hypokinesis. Left ventricular diastolic  parameters were normal.   2. Right ventricular systolic function is normal. The right ventricular  size is normal.   3. No evidence of mitral valve regurgitation.   4. Aortic valve regurgitation is not visualized.   5. The inferior vena cava is dilated in size with >50% respiratory  variability, suggesting right atrial pressure of 8 mmHg.   Conclusion(s)/Recommendation(s): Has LAD terriotory wall motion  abnormality, no prior echo to compare.   Laboratory Data:  High Sensitivity Troponin:  No results for input(s): "TROPONINIHS" in the last 720 hours.   Chemistry Recent Labs  Lab 11/30/21 0305 12/01/21 0310 12/02/21 0500 12/03/21 0457  NA 138 138 138 137  K 4.2 3.9 3.8 3.3*  CL 107 105 103 103  CO2 GLUCOSE 120* 112* 96 117*  BUN 17 16 21* 19  CREATININE 0.71 0.67 0.88 0.93  CALCIUM 8.4* 8.4* 8.2* 8.3*  MG 2.0 1.7  1.7  --   GFRNONAA >60 >60 >60 >60  ANIONGAP Recent Labs  Lab 11/29/21 0307 11/30/21 0305 12/01/21 0310  PROT 5.8* 6.2* 6.6  ALBUMIN 2.0* 2.0* 2.1*  AST 203* 224* 232*  ALT 83* 94* 109*  ALKPHOS 117 126 119  BILITOT 11.1* 11.2* 9.6*  Lipids No results for input(s): "CHOL", "TRIG", "HDL", "LABVLDL", "LDLCALC", "CHOLHDL" in the last 168 hours.  Hematology Recent Labs  Lab 12/01/21 0310 12/02/21 0500 12/03/21 0457  WBC 13.5* 15.3* 18.9*  RBC 4.08* 3.61* 3.46*  HGB 13.8 12.5* 11.9*  HCT 41.8 36.7* 35.4*  MCV 102.5* 101.7* 102.3*  MCH 33.8 34.6* 34.4*  MCHC 33.0 34.1 33.6  RDW 21.0* 20.7* 20.2*  PLT 231 229 250   Thyroid No results for input(s): "TSH", "FREET4" in the last 168 hours.  BNPNo results for input(s): "BNP", "PROBNP" in the last 168 hours.  DDimer No results for input(s): "DDIMER" in the last 168 hours.   Radiology/Studies:  DG Chest Port 1 View  Result Date: 12/02/2021 CLINICAL DATA:  Respiratory failure. EXAM: PORTABLE CHEST 1 VIEW COMPARISON:  November 30, 2021 FINDINGS: Right PICC line terminates approximately at the cavoatrial junction. The cardiac silhouette is enlarged. Mediastinal contours appear intact. Mixed pattern pulmonary edema with possible bilateral pleural effusions. The aeration of the lungs has slightly worsened when compared to most recent radiograph June fourth 2023. Osseous structures are without acute abnormality. Soft tissues are grossly normal. IMPRESSION: 1. Mixed pattern pulmonary edema with possible bilateral pleural effusions. 2. Enlarged cardiac silhouette. Electronically Signed   By: Ted Mcalpine M.D.   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  - LVEF 45-50% on Echo 11/29/21 - clinically mild volume overload today - Net - since admission, no weight trend recorded; monitor daily weight, I&O, electrolyte - GDMT: will start Coreg 3.125mg  BID and  losartan 25mg , may consider SGLT2I at office if needed - strong encouraged ETOH and substance use cessation - continue IV lasix 40mg  daily, transition to PO 40mg  daily when euvolemic  - repeat Echo outpatient in 4-6 weeks  Acute hypoxic respiratory failure Metabolic encephalopathy  Possible pneumonia Alcoholic hepatitis  Alcoholic hepatic steatosis ETOH withdrawal with DT Acute anemia  Hypokalemia Hypophosphatemia  Hx of hepatitis C  - per primary team   Risk Assessment/Risk Scores:    New York Heart Association (NYHA) Functional Class NYHA Class I-II   {contact [facility] Please consult [email or web address] for contact info under    Signed, , NP  12/04/2021 10:02 AM

## 2021-12-04 NOTE — Plan of Care (Signed)
  Problem: Activity: Goal: Risk for activity intolerance will decrease Outcome: Progressing   Problem: Coping: Goal: Level of anxiety will decrease Outcome: Progressing   Problem: Skin Integrity: Goal: Risk for impaired skin integrity will decrease Outcome: Progressing   Problem: Safety: Goal: Non-violent Restraint(s) Outcome: Completed/Met

## 2021-12-05 DIAGNOSIS — F10239 Alcohol dependence with withdrawal, unspecified: Secondary | ICD-10-CM | POA: Diagnosis not present

## 2021-12-05 LAB — CBC
HCT: 34 % — ABNORMAL LOW (ref 39.0–52.0)
Hemoglobin: 11.5 g/dL — ABNORMAL LOW (ref 13.0–17.0)
MCH: 35.4 pg — ABNORMAL HIGH (ref 26.0–34.0)
MCHC: 33.8 g/dL (ref 30.0–36.0)
MCV: 104.6 fL — ABNORMAL HIGH (ref 80.0–100.0)
Platelets: 238 10*3/uL (ref 150–400)
RBC: 3.25 MIL/uL — ABNORMAL LOW (ref 4.22–5.81)
RDW: 20.7 % — ABNORMAL HIGH (ref 11.5–15.5)
WBC: 23.5 10*3/uL — ABNORMAL HIGH (ref 4.0–10.5)
nRBC: 0 % (ref 0.0–0.2)

## 2021-12-05 LAB — COMPREHENSIVE METABOLIC PANEL
ALT: 105 U/L — ABNORMAL HIGH (ref 0–44)
AST: 179 U/L — ABNORMAL HIGH (ref 15–41)
Albumin: 2.1 g/dL — ABNORMAL LOW (ref 3.5–5.0)
Alkaline Phosphatase: 130 U/L — ABNORMAL HIGH (ref 38–126)
Anion gap: 6 (ref 5–15)
BUN: 13 mg/dL (ref 6–20)
CO2: 24 mmol/L (ref 22–32)
Calcium: 8.4 mg/dL — ABNORMAL LOW (ref 8.9–10.3)
Chloride: 108 mmol/L (ref 98–111)
Creatinine, Ser: 0.66 mg/dL (ref 0.61–1.24)
GFR, Estimated: >60 mL/min (ref >60)
Glucose, Bld: 88 mg/dL (ref 70–99)
Potassium: 3.9 mmol/L (ref 3.5–5.1)
Sodium: 138 mmol/L (ref 135–145)
Total Bilirubin: 7 mg/dL — ABNORMAL HIGH (ref 0.3–1.2)
Total Protein: 6.2 g/dL — ABNORMAL LOW (ref 6.5–8.1)

## 2021-12-05 LAB — GLUCOSE, CAPILLARY: Glucose-Capillary: 112 mg/dL — ABNORMAL HIGH (ref 70–99)

## 2021-12-05 LAB — MAGNESIUM: Magnesium: 2 mg/dL (ref 1.7–2.4)

## 2021-12-05 LAB — PHOSPHORUS: Phosphorus: 2.9 mg/dL (ref 2.5–4.6)

## 2021-12-05 MED ORDER — FUROSEMIDE 40 MG PO TABS
40.0000 mg | ORAL_TABLET | Freq: Every day | ORAL | Status: DC
  Administered 2021-12-05: 40 mg via ORAL
  Filled 2021-12-05: qty 1

## 2021-12-05 MED ORDER — FOLIC ACID 1 MG PO TABS
1.0000 mg | ORAL_TABLET | Freq: Every day | ORAL | Status: DC
  Administered 2021-12-05: 1 mg via ORAL
  Filled 2021-12-05: qty 1

## 2021-12-05 NOTE — Progress Notes (Signed)
PROGRESS NOTE    Larry Adams  PPI:[id number] DOB: [birth year] DOA: 11/22/2021 PCP: Patient, No Pcp Per (Inactive)   Brief Narrative:  36 year old male with alcohol dependence, depression, chronic back pain, drug overuse, anxiety, depression admitted to the hospital vomiting with concerns of alcohol withdrawal.  Work-up was consistent with alcoholic hepatitis and due to withdrawals he was admitted to the ICU and started on Precedex.  He was transferred back to the floor but again due to concerns of alcohol withdrawal he was transferred to the ICU for more Precedex drip.  He also received Librium taper and eventually phenobarbital was ordered.  LFTs slowly improved.  Initially appeared to be significantly volume overloaded requiring Lasix.  Echocardiogram showed EF of 45% with akinesis to anterior/anteroseptal wall.  Seen by cardiology team who recommended Coreg, losartan, Aldactone, Lasix   Assessment & Plan:  Principal Problem:   Alcohol dependence with withdrawal with complication [facility]) Active Problems:   Alcoholic hepatitis without ascites   Hyperbilirubinemia   Hypokalemia   Hypomagnesemia   Tobacco use   Anxiety and depression     Assessment and Plan: Acute metabolic encephalopathy with alcohol withdrawal Transaminitis with elevated total bilirubin -Completed phenobarbital taper 6/8.  Currently not showing any signs of withdrawal - Multivitamin, folic acid, thiamine - Right upper quadrant ultrasound-shows gallbladder sludge and fatty infiltration - LFTs are improving  Acute hypoxic respiratory distress - Multifactorial-HCAP, atelectasis, volume overload - On IV cefepime, completed 5-day course  Healthcare associated pneumonia -Completed 5 days of IV cefepime.  Bronchodilators, I-S/flutter valve  Acute congestive heart failure with reduced ejection fraction, EF 45% - Echocardiogram shows EF 45% with anteroseptal wall/anterior wall akinesis. - IV diuretics, plans to  start p.o. today. - Seen by cardiology team-currently on on Coreg, losartan and Aldactone.  Acute alcoholic hepatitis History of hepatitis C infection - Hep A, hep B negative.  Hep C antibody is reactive - Previously has not been treated for hepatitis C - HCVRNA-not detected.  Advised to follow-up outpatient - Currently on lactulose twice daily, rifaximin and and prednisone 40 mg oral.  Complete 28-day course.  Liquid prednisone is affordable therefore will consider this upon discharge.  Due to issues with cost is rifaximin will likely not be continued upon discharge. -Discriminant function score greater than 32  Leukocytosis - Secondary to steroid use.  No evidence of active infection anymore.  Frequent agitation -This is improved, no longer needing Haldol.  Hypokalemia/hypomagnesemia - Replete as necessary. Check Phos  Anemia of chronic disease, macrocytosis - Hemoglobin around baseline of 12.  No active signs of bleeding.   DVT prophylaxis: Lovenox Code Status: Full code Family Communication: Met with patient's father at bedside yesterday  Status is: Inpatient Remains inpatient appropriate because: Ongoing medication management by cardiology team.  Hopefully we can discharge him in next 24 hours once we have home safe disposition planning  Subjective: Patient seen and examined at bedside.  Overall appears very fatigued and occasionally gets tachycardic with any movement.  His diet is slowly improving. Examination:  Constitutional: Not in acute distress Respiratory: Clear to auscultation bilaterally Cardiovascular: Normal sinus rhythm, no rubs Abdomen: Nontender nondistended good bowel sounds Musculoskeletal: No edema noted Skin: No rashes seen Neurologic: CN 2-12 grossly intact.  And nonfocal Psychiatric: Normal judgment and insight. Alert and oriented x 3. Normal mood. Objective: Vitals:   12/04/21 2115 12/05/21 0000 12/05/21 0500 12/05/21 0511  BP: 123/73 119/75   117/74  Pulse:  87  84  Resp: (!) 21 17  (!)  30  Temp:  98.1 F (36.7 C) 97.7 F (36.5 C)   TempSrc:  Oral Oral   SpO2:  93%  97%  Weight:    131.1 kg  Height:        Intake/Output Summary (Last 24 hours) at 12/05/2021 0730 Last data filed at 12/05/2021 0344 Gross per 24 hour  Intake 1040 ml  Output 2750 ml  Net -1710 ml   Filed Weights   11/22/21 1410 11/22/21 2241 12/05/21 0511  Weight: 127 kg 129.8 kg 131.1 kg     Data Reviewed:   CBC: Recent Labs  Lab 11/30/21 0305 12/01/21 0310 12/02/21 0500 12/03/21 0457 12/05/21 0338  WBC 13.9* 13.5* 15.3* 18.9* 23.5*  NEUTROABS  --   --  9.0*  --   --   HGB 13.1 13.8 12.5* 11.9* 11.5*  HCT 39.4 41.8 36.7* 35.4* 34.0*  MCV 102.1* 102.5* 101.7* 102.3* 104.6*  PLT 207 231 229 250 254   Basic Metabolic Panel: Recent Labs  Lab 11/29/21 0307 11/30/21 0305 12/01/21 0310 12/02/21 0500 12/03/21 0457 12/04/21 0833 12/05/21 0338  NA 136 138 138 138 137  --  138  K 3.7 4.2 3.9 3.8 3.3*  --  3.9  CL 107 107 105 103 103  --  108  CO2 _0 --  24  GLUCOSE 101* 120* 112* 96 117*  --  88  BUN _1 21* 19  --  13  CREATININE 0.76 0.71 0.67 0.88 0.93  --  0.66  CALCIUM 8.3* 8.4* 8.4* 8.2* 8.3*  --  8.4*  MG 1.7 2.0 1.7 1.7  --   --  2.0  PHOS 3.7 4.0 3.9 3.5  --  1.8* 2.9   GFR: Estimated Creatinine Clearance: 181.3 mL/min (by C-G formula based on SCr of 0.66 mg/dL). Liver Function Tests: Recent Labs  Lab 11/29/21 0307 11/30/21 0305 12/01/21 0310 12/05/21 0338  AST 203* 224* 232* 179*  ALT 83* 94* 109* 105*  ALKPHOS 117 126 119 130*  BILITOT 11.1* 11.2* 9.6* 7.0*  PROT 5.8* 6.2* 6.6 6.2*  ALBUMIN 2.0* 2.0* 2.1* 2.1*   No results for input(s): "LIPASE", "AMYLASE" in the last 168 hours. Recent Labs  Lab 11/29/21 0307  AMMONIA 54*   Coagulation Profile: Recent Labs  Lab 11/29/21 0307 11/30/21 0305 12/01/21 0310  INR 1.4* 1.4* 1.3*   Cardiac Enzymes: No results for input(s): "CKTOTAL", "CKMB",  "CKMBINDEX", "TROPONINI" in the last 168 hours. BNP (last 3 results) No results for input(s): "PROBNP" in the last 8760 hours. HbA1C: No results for input(s): "HGBA1C" in the last 72 hours. CBG: Recent Labs  Lab 12/05/21 0426  GLUCAP 112*   Lipid Profile: No results for input(s): "CHOL", "HDL", "LDLCALC", "TRIG", "CHOLHDL", "LDLDIRECT" in the last 72 hours. Thyroid Function Tests: No results for input(s): "TSH", "T4TOTAL", "FREET4", "T3FREE", "THYROIDAB" in the last 72 hours. Anemia Panel: No results for input(s): "VITAMINB12", "FOLATE", "FERRITIN", "TIBC", "IRON", "RETICCTPCT" in the last 72 hours. Sepsis Labs: Recent Labs  Lab 11/29/21 0307 11/30/21 0305 12/01/21 0310  PROCALCITON 1.76 1.33 1.19  LATICACIDVEN 0.8 1.0  --     No results found for this or any previous visit (from the past 240 hour(s)).       Radiology Studies: No results found.      Scheduled Meds:  carvedilol  3.125 mg Oral BID WC   Chlorhexidine Gluconate Cloth  6 each Topical Daily   enoxaparin (LOVENOX) injection  40 mg Subcutaneous  O28C   folic acid  1 mg Intravenous Daily   furosemide  40 mg Intravenous Daily   lactulose  10 g Oral BID   losartan  25 mg Oral Daily   mouth rinse  15 mL Mouth Rinse q12n4p   multivitamin with minerals  1 tablet Oral Daily   nicotine  21 mg Transdermal Daily   polyethylene glycol  17 g Oral Daily   prednisoLONE  40 mg Oral Daily   rifaximin  550 mg Oral BID   sodium chloride flush  10-40 mL Intracatheter Q12H   sodium chloride flush  3 mL Intravenous Q12H   spironolactone  12.5 mg Oral Daily   thiamine  100 mg Oral Daily   Or   thiamine  100 mg Intravenous Daily   Continuous Infusions:  sodium chloride Stopped (11/30/21 2300)   dextrose 5% lactated ringers Stopped (12/01/21 1137)     LOS: 13 days   Time spent= 35 mins    Marietta Sikkema Arsenio Loader, MD [facility]  If 7PM-7AM, please contact night-coverage  12/05/2021, 7:30 AM

## 2021-12-05 NOTE — Progress Notes (Signed)
Progress Note  Patient Name: Larry Adams Date of Encounter: 12/05/2021  [place] Cardiologist: None , APP  Subjective   Larry Adams is ready to go soon. He is asymptomatic  Inpatient Medications    Scheduled Meds:  carvedilol  3.125 mg Oral BID WC   Chlorhexidine Gluconate Cloth  6 each Topical Daily   enoxaparin (LOVENOX) injection  40 mg Subcutaneous [id number]   folic acid  1 mg Intravenous Daily   furosemide  40 mg Intravenous Daily   lactulose  10 g Oral BID   losartan  25 mg Oral Daily   mouth rinse  15 mL Mouth Rinse q12n4p   multivitamin with minerals  1 tablet Oral Daily   nicotine  21 mg Transdermal Daily   polyethylene glycol  17 g Oral Daily   prednisoLONE  40 mg Oral Daily   rifaximin  550 mg Oral BID   sodium chloride flush  10-40 mL Intracatheter Q12H   sodium chloride flush  3 mL Intravenous Q12H   spironolactone  12.5 mg Oral Daily   thiamine  100 mg Oral Daily   Or   thiamine  100 mg Intravenous Daily   Continuous Infusions:  sodium chloride Stopped (11/30/21 2300)   dextrose 5% lactated ringers Stopped (12/01/21 1137)   PRN Meds: sodium chloride, acetaminophen, alum & mag hydroxide-simeth, calcium carbonate, guaiFENesin, hydrALAZINE, hydrocortisone, hydrocortisone cream, ipratropium-albuterol, lip balm, loratadine, LORazepam, metoprolol tartrate, Muscle Rub, ondansetron **OR** ondansetron (ZOFRAN) IV, oxyCODONE, phenol, polyvinyl alcohol, senna-docusate, sodium chloride, sodium chloride flush, traZODone   Vital Signs    Vitals:   12/05/21 0000 12/05/21 0500 12/05/21 0511 12/05/21 0800  BP: 119/75  117/74   Pulse: 87  84   Resp: 17  (!) 30   Temp: 98.1 F (36.7 C) 97.7 F (36.5 C)  100 F (37.8 C)  TempSrc: Oral Oral  Oral  SpO2: 93%  97%   Weight:   131.1 kg   Height:        Intake/Output Summary (Last 24 hours) at 12/05/2021 0954 Last data filed at 12/05/2021 0344 Gross per 24 hour  Intake 1040 ml  Output 2750 ml  Net -1710 ml       12/05/2021    5:11 AM 11/22/2021   10:41 PM 11/22/2021    2:10 PM  Last 3 Weights  Weight (lbs) 289 lb 0.4 oz 286 lb 2.5 oz 280 lb  Weight (kg) 131.1 kg 129.8 kg 127.007 kg      Telemetry    NSR - Personally Reviewed  ECG    NA - Personally Reviewed  Physical Exam   Vitals:   12/05/21 0511 12/05/21 0800  BP: 117/74   Pulse: 84   Resp: (!) 30   Temp:  100 F (37.8 C)  SpO2: 97%     GEN: No acute distress.   Neck: No JVD Cardiac: RRR, no murmurs, rubs, or gallops.  Respiratory: Clear to auscultation bilaterally. GI: Soft, nontender, non-distended  MS: No edema; No deformity. Neuro:  Nonfocal  Psych: Normal affect   Labs    High Sensitivity Troponin:  No results for input(s): "TROPONINIHS" in the last 720 hours.   Chemistry Recent Labs  Lab 11/30/21 0305 12/01/21 0310 12/02/21 0500 12/03/21 0457 12/05/21 0338  NA 138 138 138 137 138  K 4.2 3.9 3.8 3.3* 3.9  CL 107 105 103 103 108  CO2 23 25 26 26 24   GLUCOSE 120* 112* 96 117* 88  BUN 17 16 21*  19 13  CREATININE 0.71 0.67 0.88 0.93 0.66  CALCIUM 8.4* 8.4* 8.2* 8.3* 8.4*  MG 2.0 1.7 1.7  --  2.0  PROT 6.2* 6.6  --   --  6.2*  ALBUMIN 2.0* 2.1*  --   --  2.1*  AST 224* 232*  --   --  179*  ALT 94* 109*  --   --  105*  ALKPHOS 126 119  --   --  130*  BILITOT 11.2* 9.6*  --   --  7.0*  GFRNONAA >60 >60 >60 >60 >60  ANIONGAP 8 8 9 8 6     Lipids No results for input(s): "CHOL", "TRIG", "HDL", "LABVLDL", "LDLCALC", "CHOLHDL" in the last 168 hours.  Hematology Recent Labs  Lab 12/02/21 0500 12/03/21 0457 12/05/21 0338  WBC 15.3* 18.9* 23.5*  RBC 3.61* 3.46* 3.25*  HGB 12.5* 11.9* 11.5*  HCT 36.7* 35.4* 34.0*  MCV 101.7* 102.3* 104.6*  MCH 34.6* 34.4* 35.4*  MCHC 34.1 33.6 33.8  RDW 20.7* 20.2* 20.7*  PLT 229 250 238   Thyroid No results for input(s): "TSH", "FREET4" in the last 168 hours.  BNPNo results for input(s): "BNP", "PROBNP" in the last 168 hours.  DDimer No results for input(s): "DDIMER"  in the last 168 hours.   Radiology    No results found.  Cardiac Studies   TTE 45-50 1. Akinesis of the anterior/anteroseptal wall base-apex. Left ventricular  ejection fraction, by estimation, is 45 to 50%. The left ventricle has  mildly decreased function. The left ventricle demonstrates global  hypokinesis. Left ventricular diastolic  parameters were normal.  LVIDd 6.0 cm  2. Right ventricular systolic function is normal. The right ventricular  size is normal.   3. No evidence of mitral valve regurgitation.   4. Aortic valve regurgitation is not visualized.   5. The inferior vena cava is dilated in size with >50% respiratory  variability, suggesting right atrial pressure of 8 mmHg.  Patient Profile     Larry Adams is a 36 y.o. 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 Lamonte Sakai.  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 EF mildly-moderately low. No signs of ACS. Will not pursue ischemic evaluation at this time.  Will plan to start GDMT; would say he is Stage B HF.  He is euvolemic with trace to 1+ edema in the ankles. Started coreg 3.125 mg BID and losartan 25 mg daily. Will also add spironolactone. Started lasix 40 mg daily today. He is stable from a cardiac perspective for discharge. He has a follow up appointment   For questions or updates, please contact [place] Please consult [email or web address] for contact info under        Signed, Janina Mayo, MD  12/05/2021, 9:54 AM

## 2021-12-06 DIAGNOSIS — F10239 Alcohol dependence with withdrawal, unspecified: Secondary | ICD-10-CM | POA: Diagnosis not present

## 2021-12-06 LAB — COMPREHENSIVE METABOLIC PANEL
ALT: 97 U/L — ABNORMAL HIGH (ref 0–44)
AST: 164 U/L — ABNORMAL HIGH (ref 15–41)
Albumin: 2.1 g/dL — ABNORMAL LOW (ref 3.5–5.0)
Alkaline Phosphatase: 120 U/L (ref 38–126)
Anion gap: 5 (ref 5–15)
BUN: 14 mg/dL (ref 6–20)
CO2: 24 mmol/L (ref 22–32)
Calcium: 8.8 mg/dL — ABNORMAL LOW (ref 8.9–10.3)
Chloride: 109 mmol/L (ref 98–111)
Creatinine, Ser: 0.69 mg/dL (ref 0.61–1.24)
GFR, Estimated: >60 mL/min (ref >60)
Glucose, Bld: 89 mg/dL (ref 70–99)
Potassium: 3.9 mmol/L (ref 3.5–5.1)
Sodium: 138 mmol/L (ref 135–145)
Total Bilirubin: 6.1 mg/dL — ABNORMAL HIGH (ref 0.3–1.2)
Total Protein: 6.1 g/dL — ABNORMAL LOW (ref 6.5–8.1)

## 2021-12-06 LAB — CBC
HCT: 33.8 % — ABNORMAL LOW (ref 39.0–52.0)
Hemoglobin: 11.2 g/dL — ABNORMAL LOW (ref 13.0–17.0)
MCH: 34.9 pg — ABNORMAL HIGH (ref 26.0–34.0)
MCHC: 33.1 g/dL (ref 30.0–36.0)
MCV: 105.3 fL — ABNORMAL HIGH (ref 80.0–100.0)
Platelets: 240 10*3/uL (ref 150–400)
RBC: 3.21 MIL/uL — ABNORMAL LOW (ref 4.22–5.81)
RDW: 20 % — ABNORMAL HIGH (ref 11.5–15.5)
WBC: 20.9 10*3/uL — ABNORMAL HIGH (ref 4.0–10.5)
nRBC: 0 % (ref 0.0–0.2)

## 2021-12-06 LAB — MAGNESIUM: Magnesium: 2 mg/dL (ref 1.7–2.4)

## 2021-12-06 MED ORDER — THIAMINE HCL 100 MG PO TABS: 100.0000 mg | ORAL_TABLET | Freq: Every day | ORAL | 0 refills | Status: AC

## 2021-12-06 MED ORDER — LACTULOSE 10 GM/15ML PO SOLN: 10.0000 g | Freq: Two times a day (BID) | ORAL | 0 refills | Status: AC

## 2021-12-06 MED ORDER — LOSARTAN POTASSIUM 25 MG PO TABS
25.0000 mg | ORAL_TABLET | Freq: Every day | ORAL | 0 refills | Status: AC
Start: 2021-12-06 — End: 2022-01-05

## 2021-12-06 MED ORDER — CARVEDILOL 3.125 MG PO TABS
3.1250 mg | ORAL_TABLET | Freq: Two times a day (BID) | ORAL | 0 refills | Status: AC
Start: 2021-12-06 — End: 2022-01-05

## 2021-12-06 MED ORDER — SPIRONOLACTONE 25 MG PO TABS: 12.5000 mg | ORAL_TABLET | Freq: Every day | ORAL | 0 refills | Status: AC

## 2021-12-06 MED ORDER — PREDNISOLONE 15 MG/5ML PO SOLN: 40.0000 mg | Freq: Every day | ORAL | 0 refills | Status: AC

## 2021-12-06 MED ORDER — FOLIC ACID 1 MG PO TABS: 1.0000 mg | ORAL_TABLET | Freq: Every day | ORAL | 0 refills | Status: AC

## 2021-12-06 NOTE — Discharge Summary (Signed)
Physician Discharge Summary  Ayan Heffington ZOX:[id number] DOB: [birth year] DOA: 11/22/2021  PCP: Patient, No Pcp Per (Inactive)  Admit date: 11/22/2021 Discharge date: 12/06/2021  Admitted From: Home Disposition:  Home  Recommendations for Outpatient Follow-up:  Follow up with PCP in 1-2 weeks Please obtain BMP/CBC in one week your next doctors visit.  Outpatient follow-up with cardiology.  Cardiac medications prescribed Coreg, losartan, Aldactone Prednisolone 40 mg orally daily for 21 more days to complete 28-day course.  Liquid regimen was more affordable than p.o. therefore liquid regimen was prescribed Lactulose, folic acid, thiamine prescribed Unable to prescribe rifaximin due to its cost.  Patient is unable to afford this Follow-up outpatient gastroenterology in about 4 weeks.  Information given Counseled to quit drinking alcohol   Discharge Condition: Stable CODE STATUS: Full code Diet recommendation: Heart healthy  Brief/Interim Summary: 36 year old male with alcohol dependence, depression, chronic back pain, drug overuse, anxiety, depression admitted to the hospital vomiting with concerns of alcohol withdrawal.  Work-up was consistent with alcoholic hepatitis and due to withdrawals he was admitted to the ICU and started on Precedex.  He was transferred back to the floor but again due to concerns of alcohol withdrawal he was transferred to the ICU for more Precedex drip.  He also received Librium taper and eventually phenobarbital was ordered.  LFTs slowly improved.  Initially appeared to be significantly volume overloaded requiring Lasix.  Echocardiogram showed EF of 45% with akinesis to anterior/anteroseptal wall.  Seen by cardiology team who recommended Coreg, losartan, Aldactone, Lasix.  Over the course of several days patient did well and no longer was showing any signs of withdrawal.  Medication as prescribed and mentioned above. On the day of discharge patient's father was at  bedside and I answered all the questions.  All the instructions were given by me as well.     Assessment & Plan:  Principal Problem:   Alcohol dependence with withdrawal with complication [facility]) Active Problems:   Alcoholic hepatitis without ascites   Hyperbilirubinemia   Hypokalemia   Hypomagnesemia   Tobacco use   Anxiety and depression       Assessment and Plan: Acute metabolic encephalopathy with alcohol withdrawal Transaminitis with elevated total bilirubin -Completed phenobarbital taper 6/8.  Currently not showing any signs of withdrawal - Multivitamin, folic acid, thiamine - Right upper quadrant ultrasound-shows gallbladder sludge and fatty infiltration - LFTs are improving.  Will need to follow-up outpatient with PCP   Acute hypoxic respiratory distress - Multifactorial-HCAP, atelectasis, volume overload - On IV cefepime, completed 5-day course   Healthcare associated pneumonia -Completed 5 days of IV cefepime.  Bronchodilators, I-S/flutter valve   Acute congestive heart failure with reduced ejection fraction, EF 45% - Echocardiogram shows EF 45% with anteroseptal wall/anterior wall akinesis. - Seen by cardiology team-currently on on Coreg, losartan and Aldactone.  These medications have been prescribed to him.  He has been advised to follow-up outpatient with PCP and cardiology team.  Acute alcoholic hepatitis History of hepatitis C infection - Hep A, hep B negative.  Hep C antibody is reactive - Previously has not been treated for hepatitis C - HCVRNA-not detected.  Advised to follow-up outpatient - Currently on lactulose twice daily, rifaximin and and prednisone 40 mg oral.  Complete 28-day course.  Liquid prednisone prescribed as it is more affordable with NPO.  Unable to prescribe rifaximin due to his cost and patient states he will not build to afford it. -Discriminant function score greater than 32   Leukocytosis -  Secondary to steroid use.  No evidence of  active infection anymore.   Frequent agitation - Resolved   Hypokalemia/hypomagnesemia - Normal  Anemia of chronic disease, macrocytosis - Hemoglobin around baseline of 12.  No active signs of bleeding.   Assessment and Plan:      Body mass index is 37.38 kg/m.       Discharge Diagnoses:  Principal Problem:   Alcohol dependence with withdrawal with complication (HCC) Active Problems:   Alcoholic hepatitis without ascites   Hyperbilirubinemia   Hypokalemia   Hypomagnesemia   Tobacco use   Anxiety and depression      Consultations: [facility] gastroenterology Cardiology  Subjective: Feels better this morning, does not have any complaints.  Father is present at bedside.  Patient is really eager to be discharged.  Discharge Exam: Vitals:   12/05/21 2131 12/06/21 0552  BP: 114/69 129/84  Pulse: 83 94  Resp: 18 18  Temp: 98.2 F (36.8 C) 98.2 F (36.8 C)  SpO2: 97% 98%   Vitals:   12/05/21 1251 12/05/21 2131 12/06/21 0500 12/06/21 0552  BP: 137/76 114/69  129/84  Pulse: 95 83  94  Resp: Temp: 98 F (36.7 C) 98.2 F (36.8 C)  98.2 F (36.8 C)  TempSrc:  Oral  Oral  SpO2: 98% 97%  98%  Weight:   128.5 kg   Height:        General: Pt is alert, awake, not in acute distress.  Jaundiced and scleral icterus Cardiovascular: RRR, S1/S2 +, no rubs, no gallops Respiratory: CTA bilaterally, no wheezing, no rhonchi Abdominal: Soft, NT, ND, bowel sounds + Extremities: no edema, no cyanosis  Discharge Instructions   Allergies as of 12/06/2021       Reactions   Morphine And Related Hives   Vancomycin Itching, Other (See Comments)   "Red man syndrome"         Medication List     STOP taking these medications    chlordiazePOXIDE 25 MG capsule Commonly known as: LIBRIUM       TAKE these medications    carvedilol 3.125 MG tablet Commonly known as: COREG Take 1 tablet (3.125 mg total) by mouth 2 (two) times daily with a meal.    folic acid 1 MG tablet Commonly known as: FOLVITE Take 1 tablet (1 mg total) by mouth daily.   furosemide 20 MG tablet Commonly known as: LASIX Take 1 tablet (20 mg total) by mouth daily.   lactulose 10 GM/15ML solution Commonly known as: CHRONULAC Take 15 mLs (10 g total) by mouth 2 (two) times daily.   losartan 25 MG tablet Commonly known as: COZAAR Take 1 tablet (25 mg total) by mouth daily.   prednisoLONE 15 MG/5ML Soln Commonly known as: PRELONE Take 13.3 mLs (40 mg total) by mouth daily before breakfast for 21 days.   spironolactone 25 MG tablet Commonly known as: ALDACTONE Take 0.5 tablets (12.5 mg total) by mouth daily.   thiamine 100 MG tablet Take 1 tablet (100 mg total) by mouth daily.        Follow-up Information     Gastroenterology, Deboraha Sprang. Schedule an appointment as soon as possible for a visit in 1 month(s).   Contact information: [address] [place] [address] [phone]         Maisie Fus, MD. Call in 1 week(s).   Specialty: Cardiology Contact information: [address] [place] [address] [phone]  Allergies  Allergen Reactions   Morphine And Related Hives   Vancomycin Itching and Other (See Comments)    "Red man syndrome"     You were cared for by a hospitalist during your hospital stay. If you have any questions about your discharge medications or the care you received while you were in the hospital after you are discharged, you can call the unit and asked to speak with the hospitalist on call if the hospitalist that took care of you is not available. Once you are discharged, your primary care physician will handle any further medical issues. Please note that no refills for any discharge medications will be authorized once you are discharged, as it is imperative that you return to your primary care physician (or establish a relationship with a primary care physician if you do  not have one) for your aftercare needs so that they can reassess your need for medications and monitor your lab values.   Procedures/Studies: DG Chest Port 1 View  Result Date: 12/02/2021 CLINICAL DATA:  Respiratory failure. EXAM: PORTABLE CHEST 1 VIEW COMPARISON:  November 30, 2021 FINDINGS: Right PICC line terminates approximately at the cavoatrial junction. The cardiac silhouette is enlarged. Mediastinal contours appear intact. Mixed pattern pulmonary edema with possible bilateral pleural effusions. The aeration of the lungs has slightly worsened when compared to most recent radiograph June fourth 2023. Osseous structures are without acute abnormality. Soft tissues are grossly normal. IMPRESSION: 1. Mixed pattern pulmonary edema with possible bilateral pleural effusions. 2. Enlarged cardiac silhouette. Electronically Signed   By: Ted Mcalpine M.D.   On: 12/02/2021 08:50   DG CHEST PORT 1 VIEW  Result Date: 11/30/2021 CLINICAL DATA:  Alcohol withdrawal. EXAM: PORTABLE CHEST 1 VIEW COMPARISON:  11/28/2021 FINDINGS: Right arm PICC line is seen in place with tip overlying the proximal right atrium. Low lung volumes again noted. Cardiomegaly stable. Diffuse interstitial infiltrates are stable as well as bibasilar atelectasis. IMPRESSION: Right arm PICC line tip overlies the proximal right atrium. Stable cardiomegaly and diffuse interstitial infiltrates/edema. Stable low lung volumes and bibasilar atelectasis. Electronically Signed   By: Danae Orleans M.D.   On: 11/30/2021 08:24   DG Abd 1 View  Result Date: 11/29/2021 CLINICAL DATA:  [id number].  Feeding tube placement. EXAM: ABDOMEN - 1 VIEW COMPARISON:  None Available. FINDINGS: Enteric tube coursing below the hemidiaphragm with tip overlying the expected region the gastric lumen. The bowel gas pattern is normal. No radio-opaque calculi or other significant radiographic abnormality are seen. IMPRESSION: Enteric tube coursing below the hemidiaphragm with tip  overlying the expected region the gastric lumen. Electronically Signed   By: Tish Frederickson M.D.   On: 11/29/2021 16:39   ECHOCARDIOGRAM COMPLETE  Result Date: 11/29/2021    ECHOCARDIOGRAM REPORT   Patient Name:   Larry Adams Date of Exam: 11/29/2021 Medical Rec #:  [id number]       Height:       73.0 in Accession #:    [id number]      Weight:       286.2 lb Date of Birth:  1986-04-07      BSA:          2.505 m Patient Age:    35 years        BP:           110/63 mmHg Patient Gender: M               HR:  65 bpm. Exam Location:  Inpatient Procedure: 2D Echo, Cardiac Doppler, Color Doppler and Intracardiac            Opacification Agent Indications:    Respiratory distress  History:        Patient has no prior history of Echocardiogram examinations.  Sonographer:    Cleatis Polka Referring Phys: [phone] MURALI RAMASWAMY  Sonographer Comments: Patient is morbidly obese. Image acquisition challenging due to patient body habitus. Pt in restraints. IMPRESSIONS  1. Akinesis of the anterior/anteroseptal wall base-apex. Left ventricular ejection fraction, by estimation, is 45 to 50%. The left ventricle has mildly decreased function. The left ventricle demonstrates global hypokinesis. Left ventricular diastolic parameters were normal.  2. Right ventricular systolic function is normal. The right ventricular size is normal.  3. No evidence of mitral valve regurgitation.  4. Aortic valve regurgitation is not visualized.  5. The inferior vena cava is dilated in size with >50% respiratory variability, suggesting right atrial pressure of 8 mmHg. Conclusion(s)/Recommendation(s): Has LAD terriotory wall motion abnormality, no prior echo to compare. FINDINGS  Left Ventricle: Akinesis of the anterior/anteroseptal wall base-apex. Left ventricular ejection fraction, by estimation, is 45 to 50%. The left ventricle has mildly decreased function. The left ventricle demonstrates global hypokinesis. Definity contrast agent was  given IV to delineate the left ventricular endocardial borders. The left ventricular internal cavity size was normal in size. There is no left ventricular hypertrophy. Left ventricular diastolic parameters were normal. Right Ventricle: The right ventricular size is normal. Right ventricular systolic function is normal. Left Atrium: Left atrial size was normal in size. Right Atrium: Right atrial size was normal in size. Pericardium: There is no evidence of pericardial effusion. Mitral Valve: No evidence of mitral valve regurgitation. Tricuspid Valve: Tricuspid valve regurgitation is not demonstrated. Aortic Valve: Aortic valve regurgitation is not visualized. Aortic valve peak gradient measures 10.9 mmHg. Pulmonic Valve: Pulmonic valve regurgitation is not visualized. Venous: The inferior vena cava is dilated in size with greater than 50% respiratory variability, suggesting right atrial pressure of 8 mmHg.  LEFT VENTRICLE PLAX 2D LVIDd:         6.00 cm      Diastology LVIDs:         4.80 cm      LV e' medial:    9.79 cm/s LV PW:         1.10 cm      LV E/e' medial:  12.3 LV IVS:        0.80 cm      LV e' lateral:   10.90 cm/s LVOT diam:     2.00 cm      LV E/e' lateral: 11.0 LV SV:         74 LV SV Index:   29 LVOT Area:     3.14 cm  LV Volumes (MOD) LV vol d, MOD A2C: 222.0 ml LV vol d, MOD A4C: 175.0 ml LV vol s, MOD A2C: 126.0 ml LV vol s, MOD A4C: 98.6 ml LV SV MOD A2C:     96.0 ml LV SV MOD A4C:     175.0 ml LV SV MOD BP:      88.1 ml RIGHT VENTRICLE             IVC RV Basal diam:  4.60 cm     IVC diam: 2.10 cm RV Mid diam:    4.00 cm RV S prime:     17.30 cm/s TAPSE (M-mode): 3.5 cm LEFT ATRIUM  Index        RIGHT ATRIUM           Index LA diam:        4.00 cm 1.60 cm/m   RA Area:     26.80 cm LA Vol (A2C):   66.9 ml 26.70 ml/m  RA Volume:   95.30 ml  38.04 ml/m LA Vol (A4C):   68.4 ml 27.30 ml/m LA Biplane Vol: 68.7 ml 27.42 ml/m  AORTIC VALVE AV Area (Vmax): 2.09 cm AV Vmax:        165.00  cm/s AV Peak Grad:   10.9 mmHg LVOT Vmax:      110.00 cm/s LVOT Vmean:     82.300 cm/s LVOT VTI:       0.235 m  AORTA Ao Root diam: 3.40 cm Ao Asc diam:  3.60 cm MITRAL VALVE MV Area (PHT): 2.87 cm     SHUNTS MV Decel Time: 264 msec     Systemic VTI:  0.24 m MV E velocity: 120.00 cm/s  Systemic Diam: 2.00 cm MV A velocity: 66.80 cm/s MV E/A ratio:  1.80 Photographer signed by Carolan Clines Signature Date/Time: 11/29/2021/1:17:49 PM    Final    [place] EKG SITE RITE  Result Date: 11/29/2021 If Site Rite image not attached, placement could not be confirmed due to current cardiac rhythm.  DG CHEST PORT 1 VIEW  Result Date: 11/28/2021 CLINICAL DATA:  Hypoxemia. EXAM: PORTABLE CHEST 1 VIEW COMPARISON:  11/24/2021 FINDINGS: The heart appears enlarged, possibly accentuated by portable AP technique and low lung volumes. Increasing patchy bibasilar atelectasis, possible small pleural effusions. Mild vascular congestion. No pneumothorax. IMPRESSION: 1. Low lung volumes with increasing bibasilar atelectasis and possible small pleural effusions. 2. Vascular congestion. Apparent cardiomegaly, possibly accentuated by portable AP technique and low lung volumes. Electronically Signed   By: Narda Rutherford M.D.   On: 11/28/2021 20:45   US Abdomen Limited RUQ (LIVER/GB)  Result Date: 11/28/2021 CLINICAL DATA:  Jaundice EXAM: ULTRASOUND ABDOMEN LIMITED RIGHT UPPER QUADRANT COMPARISON:  CT abdomen and pelvis 11/22/2021 FINDINGS: Gallbladder: Dependent sludge. Upper normal gallbladder wall thickness. No discrete shadowing calculi. Patient able to position LEFT lateral decubitus for additional imaging. Common bile duct: Diameter: 7 mm, dilated for age Liver: Echogenic parenchyma consistent with marked fatty infiltration as seen on CT. No gross hepatic mass or nodularity identified though assessment of intrahepatic detail is degraded by sound attenuation. Portal vein is patent on color Doppler imaging. Turbulent flow  within portal vein. Other: No RIGHT upper quadrant free fluid. IMPRESSION: Marked fatty infiltration of liver. Sludge within gallbladder with upper normal wall thickness but no discrete calculi. Electronically Signed   By: Ulyses Southward M.D.   On: 11/28/2021 10:28   [place] EKG SITE RITE  Result Date: 11/28/2021 If Site Rite image not attached, placement could not be confirmed due to current cardiac rhythm.  DG CHEST PORT 1 VIEW  Result Date: 11/24/2021 CLINICAL DATA:  Alcohol dependence with withdrawal with complication. EXAM: PORTABLE CHEST 1 VIEW COMPARISON:  09/29/2021 FINDINGS: Decreased lung volumes are seen. Bibasilar atelectasis is present. Small bilateral pleural effusions cannot be excluded. IMPRESSION: Decreased lung volumes with bibasilar atelectasis. Electronically Signed   By: Danae Orleans M.D.   On: 11/24/2021 12:31   CT ABDOMEN PELVIS W CONTRAST  Result Date: 11/22/2021 CLINICAL DATA:  Nausea and vomiting, hyperbilirubinemia EXAM: CT ABDOMEN AND PELVIS WITH CONTRAST TECHNIQUE: Multidetector CT imaging of the abdomen and pelvis was performed using the standard  protocol following bolus administration of intravenous contrast. RADIATION DOSE REDUCTION: This exam was performed according to the departmental dose-optimization program which includes automated exposure control, adjustment of the mA and/or kV according to patient size and/or use of iterative reconstruction technique. CONTRAST:  OMNIPAQUE IOHEXOL 300 MG/ML  SOLN COMPARISON:  12/18/2012 FINDINGS: Lower chest: No acute pleural or parenchymal lung disease. Hepatobiliary: Severe hepatic steatosis again noted. Likely area of focal fatty sparing within the inferior right lobe. No intrahepatic biliary duct dilation. Moderate gallbladder distension without evidence of cholelithiasis or cholecystitis. High attenuation material layering dependently in the gallbladder consistent with sludge. No biliary duct dilation. Pancreas: Unremarkable. No  pancreatic ductal dilatation or surrounding inflammatory changes. Spleen: Normal in size without focal abnormality. Adrenals/Urinary Tract: Adrenal glands are unremarkable. Kidneys are normal, without renal calculi, focal lesion, or hydronephrosis. Bladder is unremarkable. Stomach/Bowel: No bowel obstruction or ileus. Normal appendix right lower quadrant. No bowel wall thickening or inflammatory change. Vascular/Lymphatic: Stable communication between the portal vein and IVC and hepatic veins, consistent with portacaval shunt. No other significant vascular findings. No pathologic adenopathy. Reproductive: Prostate is unremarkable. Other: No free fluid or free intraperitoneal gas. No abdominal wall hernia. Musculoskeletal: Subacute to chronic healing left posterior ninth, tenth, and eleventh rib fractures. No other acute bony abnormalities. Reconstructed images demonstrate no additional findings. IMPRESSION: 1. Gallbladder sludge without evidence of cholelithiasis or cholecystitis. 2. Stable severe hepatic steatosis. 3. Stable spontaneous portacaval shunt. Electronically Signed   By: Sharlet Salina M.D.   On: 11/22/2021 16:35     The results of significant diagnostics from this hospitalization (including imaging, microbiology, ancillary and laboratory) are listed below for reference.     Microbiology: No results found for this or any previous visit (from the past 240 hour(s)).   Labs: BNP (last 3 results) Recent Labs    08/31/21 2012 09/16/21 1803 09/29/21 2041  BNP 11.8 10.8 6.9   Basic Metabolic Panel: Recent Labs  Lab 11/30/21 0305 12/01/21 0310 12/02/21 0500 12/03/21 0457 12/04/21 0833 12/05/21 0338 12/06/21 0405  NA 138 138 138 137  --  138 138  K 4.2 3.9 3.8 3.3*  --  3.9 3.9  CL 107 105 103 103  --  108 109  CO2 23 25 26 26   --  24 24  GLUCOSE 120* 112* 96 117*  --  88 89  BUN 17 16 21* 19  --  13 14  CREATININE 0.71 0.67 0.88 0.93  --  0.66 0.69  CALCIUM 8.4* 8.4* 8.2* 8.3*   --  8.4* 8.8*  MG 2.0 1.7 1.7  --   --  2.0 2.0  PHOS 4.0 3.9 3.5  --  1.8* 2.9  --    Liver Function Tests: Recent Labs  Lab 11/30/21 0305 12/01/21 0310 12/05/21 0338 12/06/21 0405  AST 224* 232* 179* 164*  ALT 94* 109* 105* 97*  ALKPHOS 126 119 130* 120  BILITOT 11.2* 9.6* 7.0* 6.1*  PROT 6.2* 6.6 6.2* 6.1*  ALBUMIN 2.0* 2.1* 2.1* 2.1*   No results for input(s): "LIPASE", "AMYLASE" in the last 168 hours. No results for input(s): "AMMONIA" in the last 168 hours. CBC: Recent Labs  Lab 12/01/21 0310 12/02/21 0500 12/03/21 0457 12/05/21 0338 12/06/21 0405  WBC 13.5* 15.3* 18.9* 23.5* 20.9*  NEUTROABS  --  9.0*  --   --   --   HGB 13.8 12.5* 11.9* 11.5* 11.2*  HCT 41.8 36.7* 35.4* 34.0* 33.8*  MCV 102.5* 101.7* 102.3* 104.6* 105.3*  PLT 231 229 250 238 240   Cardiac Enzymes: No results for input(s): "CKTOTAL", "CKMB", "CKMBINDEX", "TROPONINI" in the last 168 hours. BNP: Invalid input(s): "POCBNP" CBG: Recent Labs  Lab 12/05/21 0426  GLUCAP 112*   D-Dimer No results for input(s): "DDIMER" in the last 72 hours. Hgb A1c No results for input(s): "HGBA1C" in the last 72 hours. Lipid Profile No results for input(s): "CHOL", "HDL", "LDLCALC", "TRIG", "CHOLHDL", "LDLDIRECT" in the last 72 hours. Thyroid function studies No results for input(s): "TSH", "T4TOTAL", "T3FREE", "THYROIDAB" in the last 72 hours.  Invalid input(s): "FREET3" Anemia work up No results for input(s): "VITAMINB12", "FOLATE", "FERRITIN", "TIBC", "IRON", "RETICCTPCT" in the last 72 hours. Urinalysis    Component Value Date/Time   COLORURINE AMBER (A) 11/22/2021 1419   APPEARANCEUR CLEAR 11/22/2021 1419   LABSPEC >1.046 (H) 11/22/2021 1419   PHURINE 6.0 11/22/2021 1419   GLUCOSEU NEGATIVE 11/22/2021 1419   HGBUR SMALL (A) 11/22/2021 1419   BILIRUBINUR MODERATE (A) 11/22/2021 1419   KETONESUR NEGATIVE 11/22/2021 1419   PROTEINUR 30 (A) 11/22/2021 1419   UROBILINOGEN 0.2 12/18/2012 0113    NITRITE NEGATIVE 11/22/2021 1419   LEUKOCYTESUR NEGATIVE 11/22/2021 1419   Sepsis Labs Recent Labs  Lab 12/02/21 0500 12/03/21 0457 12/05/21 0338 12/06/21 0405  WBC 15.3* 18.9* 23.5* 20.9*   Microbiology No results found for this or any previous visit (from the past 240 hour(s)).   Time coordinating discharge:  I have spent 35 minutes face to face with the patient and on the ward discussing the patients care, assessment, plan and disposition with other care givers. >50% of the time was devoted counseling the patient about the risks and benefits of treatment/Discharge disposition and coordinating care.   SIGNED:   Dimple Nanas, MD  [facility] 12/06/2021, 11:49 AM   If 7PM-7AM, please contact night-coverage

## 2021-12-29 ENCOUNTER — Ambulatory Visit: Payer: Managed Care, Other (non HMO) | Admitting: General Practice

## 2021-12-31 NOTE — Progress Notes (Deleted)
Cardiology Clinic Note   Patient Name: Larry Adams Date of Encounter: 01/01/2022  Primary Care Provider:  Patient, No Pcp Per Primary Cardiologist:  Maisie Fus, MD  Patient Profile    Larry Adams 36 year old male presents the clinic today for follow-up evaluation of his dilated nonischemic cardiomyopathy.  Past Medical History    Past Medical History:  Diagnosis Date   Anxiety    Back pain    Depression    Drug overdose    Mental disorder    Past Surgical History:  Procedure Laterality Date   HERNIA REPAIR      Allergies  Allergies  Allergen Reactions   Morphine And Related Hives   Vancomycin Itching and Other (See Comments)    "Red man syndrome"     History of Present Illness    Larry Adams has a PMH of hepatitis C, anxiety, depression, EtOH abuse and substance use.  His PMH also includes aspiration pneumonia.  He was admitted to the hospital on 11/22/2021 and discharged on 12/06/2021.  He was admitted with acute alcoholic hepatitis and EtOH withdrawal.  Cardiology was consulted for evaluation and management of dilated cardiomyopathy with mildly reduced EF.  His echocardiogram showed an LVEF of 45-50% and showed normal diastolic parameters.  He was noted to have akinesis at the anterior/anterior septal wall based.  No significant valvular abnormalities were noted.  He presents to the clinic today for follow-up evaluation states***  *** denies chest pain, shortness of breath, lower extremity edema, fatigue, palpitations, melena, hematuria, hemoptysis, diaphoresis, weakness, presyncope, syncope, orthopnea, and PND.  Dilated nonischemic cardiomyopathy-No increased DOE or activity intolerance.  Has returned to all normal daily activities. Reports compliance with his carvedilol, losartan, furosemide and spironolactone. Continue furosemide, carvedilol, spironolactone Increase losartan to 50 mg daily Heart healthy low-sodium diet-salty 6 given Increase  physical activity as tolerated Order BMP in 1 week Plan to repeat echocardiogram once GDMT is optimized.  Substance abuse-denies use of illicit drugs.  Importance of cessation reviewed/long and encouraged.  Disposition: Follow-up with Dr. Wyline Mood or me in 1-2 months.    Home Medications    Prior to Admission medications   Medication Sig Start Date End Date Taking? Authorizing Provider  carvedilol (COREG) 3.125 MG tablet Take 1 tablet (3.125 mg total) by mouth 2 (two) times daily with a meal. 12/06/21 01/05/22  Amin, Loura Halt, MD  folic acid (FOLVITE) 1 MG tablet Take 1 tablet (1 mg total) by mouth daily. 12/06/21 01/05/22  Amin, Loura Halt, MD  furosemide (LASIX) 20 MG tablet Take 1 tablet (20 mg total) by mouth daily. 08/31/21   Vanetta Mulders, MD  lactulose (CHRONULAC) 10 GM/15ML solution Take 15 mLs (10 g total) by mouth 2 (two) times daily. 12/06/21 01/05/22  Amin, Loura Halt, MD  losartan (COZAAR) 25 MG tablet Take 1 tablet (25 mg total) by mouth daily. 12/06/21 01/05/22  Amin, Loura Halt, MD  spironolactone (ALDACTONE) 25 MG tablet Take 0.5 tablets (12.5 mg total) by mouth daily. 12/06/21 01/05/22  Amin, Loura Halt, MD  thiamine 100 MG tablet Take 1 tablet (100 mg total) by mouth daily. 12/06/21 01/05/22  Dimple Nanas, MD    Family History    Family History  Family history unknown: Yes   has no family status information on file.   Social History    Social History   Socioeconomic History   Marital status: Single    Spouse name: Not on file   Number of children: Not on  file   Years of education: Not on file   Highest education level: Not on file  Occupational History   Not on file  Tobacco Use   Smoking status: Every Day    Packs/day: 1.00    Types: Cigarettes   Smokeless tobacco: Former  Building services engineer Use: Former  Substance and Sexual Activity   Alcohol use: Yes    Alcohol/week: 6.0 standard drinks of alcohol    Types: 6 Cans of beer per week     Comment: PTA 25 oz of beer; drinking all day everyday for 2 months   Drug use: Not Currently    Types: IV, Fentanyl    Comment: Overdose on Heroin; IV Fentanyl use   Sexual activity: Yes  Other Topics Concern   Not on file  Social History Narrative   Not on file   Social Determinants of Health   Financial Resource Strain: Not on file  Food Insecurity: Not on file  Transportation Needs: Not on file  Physical Activity: Not on file  Stress: Not on file  Social Connections: Not on file  Intimate Partner Violence: Not on file     Review of Systems    General:  No chills, fever, night sweats or weight changes.  Cardiovascular:  No chest pain, dyspnea on exertion, edema, orthopnea, palpitations, paroxysmal nocturnal dyspnea. Dermatological: No rash, lesions/masses Respiratory: No cough, dyspnea Urologic: No hematuria, dysuria Abdominal:   No nausea, vomiting, diarrhea, bright red blood per rectum, melena, or hematemesis Neurologic:  No visual changes, wkns, changes in mental status. All other systems reviewed and are otherwise negative except as noted above.  Physical Exam    VS:  There were no vitals taken for this visit. , BMI There is no height or weight on file to calculate BMI. GEN: Well nourished, well developed, in no acute distress. HEENT: normal. Neck: Supple, no JVD, carotid bruits, or masses. Cardiac: RRR, no murmurs, rubs, or gallops. No clubbing, cyanosis, edema.  Radials/DP/PT 2+ and equal bilaterally.  Respiratory:  Respirations regular and unlabored, clear to auscultation bilaterally. GI: Soft, nontender, nondistended, BS + x 4. MS: no deformity or atrophy. Skin: warm and dry, no rash. Neuro:  Strength and sensation are intact. Psych: Normal affect.  Accessory Clinical Findings    Recent Labs: 09/29/2021: B Natriuretic Peptide 6.9 12/06/2021: ALT 97; BUN 14; Creatinine, Ser 0.69; Hemoglobin 11.2; Magnesium 2.0; Platelets 240; Potassium 3.9; Sodium 138    Recent Lipid Panel    Component Value Date/Time   CHOL 194 04/18/2020 0620   TRIG 40 04/18/2020 0620   HDL 73 04/18/2020 0620   CHOLHDL 2.7 04/18/2020 0620   VLDL 8 04/18/2020 0620   LDLCALC 113 (H) 04/18/2020 0620    ECG personally reviewed by me today- *** - No acute changes  Echocardiogram 12/26/2021 IMPRESSIONS     1. Akinesis of the anterior/anteroseptal wall base-apex. Left ventricular  ejection fraction, by estimation, is 45 to 50%. The left ventricle has  mildly decreased function. The left ventricle demonstrates global  hypokinesis. Left ventricular diastolic  parameters were normal.   2. Right ventricular systolic function is normal. The right ventricular  size is normal.   3. No evidence of mitral valve regurgitation.   4. Aortic valve regurgitation is not visualized.   5. The inferior vena cava is dilated in size with >50% respiratory  variability, suggesting right atrial pressure of 8 mmHg.   Conclusion(s)/Recommendation(s): Has LAD terriotory wall motion  abnormality, no prior echo  to compare.   Assessment & Plan   1.  ***   Thomasene Ripple. Larry Mario NP-C     01/01/2022, 7:42 AM [facility] [address] Office [phone] Fax [phone]  Notice: This dictation was prepared with Dragon dictation along with smaller phrase technology. Any transcriptional errors that result from this process are unintentional and may not be corrected upon review.  I spent***minutes examining this patient, reviewing medications, and using patient centered shared decision making involving her cardiac care.  Prior to her visit I spent greater than 20 minutes reviewing her past medical history,  medications, and prior cardiac tests.

## 2022-01-02 ENCOUNTER — Ambulatory Visit: Payer: Managed Care, Other (non HMO) | Admitting: General Practice

## 2022-01-09 ENCOUNTER — Encounter: Payer: Self-pay | Admitting: General Practice

## 2022-02-07 ENCOUNTER — Encounter (HOSPITAL_COMMUNITY): Payer: Self-pay

## 2022-02-07 ENCOUNTER — Emergency Department (HOSPITAL_COMMUNITY)
Admission: EM | Admit: 2022-02-07 | Discharge: 2022-02-07 | Disposition: A | Discharge status: Home / Self Care | Payer: Managed Care, Other (non HMO) | Attending: Emergency Medicine | Admitting: Emergency Medicine

## 2022-02-07 ENCOUNTER — Other Ambulatory Visit: Payer: Self-pay

## 2022-02-07 DIAGNOSIS — Z20822 Contact with and (suspected) exposure to covid-19: Secondary | ICD-10-CM | POA: Diagnosis not present

## 2022-02-07 DIAGNOSIS — Y908 Blood alcohol level of 240 mg/100 ml or more: Secondary | ICD-10-CM | POA: Insufficient documentation

## 2022-02-07 DIAGNOSIS — F1020 Alcohol dependence, uncomplicated: Secondary | ICD-10-CM

## 2022-02-07 DIAGNOSIS — R45851 Suicidal ideations: Secondary | ICD-10-CM | POA: Insufficient documentation

## 2022-02-07 DIAGNOSIS — Z79899 Other long term (current) drug therapy: Secondary | ICD-10-CM | POA: Insufficient documentation

## 2022-02-07 DIAGNOSIS — F10239 Alcohol dependence with withdrawal, unspecified: Secondary | ICD-10-CM | POA: Diagnosis present

## 2022-02-07 LAB — CBC WITH DIFFERENTIAL/PLATELET
Abs Immature Granulocytes: 0.01 10*3/uL (ref 0.00–0.07)
Basophils Absolute: 0.1 10*3/uL (ref 0.0–0.1)
Basophils Relative: 1 %
Eosinophils Absolute: 0.2 10*3/uL (ref 0.0–0.5)
Eosinophils Relative: 3 %
HCT: 48.6 % (ref 39.0–52.0)
Hemoglobin: 16.4 g/dL (ref 13.0–17.0)
Immature Granulocytes: 0 %
Lymphocytes Relative: 51 %
Lymphs Abs: 4.5 10*3/uL — ABNORMAL HIGH (ref 0.7–4.0)
MCH: 33 pg (ref 26.0–34.0)
MCHC: 33.7 g/dL (ref 30.0–36.0)
MCV: 97.8 fL (ref 80.0–100.0)
Monocytes Absolute: 0.7 10*3/uL (ref 0.1–1.0)
Monocytes Relative: 8 %
Neutro Abs: 3.2 10*3/uL (ref 1.7–7.7)
Neutrophils Relative %: 37 %
Platelets: 157 10*3/uL (ref 150–400)
RBC: 4.97 MIL/uL (ref 4.22–5.81)
RDW: 12.9 % (ref 11.5–15.5)
WBC: 8.7 10*3/uL (ref 4.0–10.5)
nRBC: 0 % (ref 0.0–0.2)

## 2022-02-07 LAB — COMPREHENSIVE METABOLIC PANEL
ALT: 32 U/L (ref 0–44)
AST: 86 U/L — ABNORMAL HIGH (ref 15–41)
Albumin: 3.1 g/dL — ABNORMAL LOW (ref 3.5–5.0)
Alkaline Phosphatase: 94 U/L (ref 38–126)
Anion gap: 6 (ref 5–15)
BUN: <5 mg/dL — ABNORMAL LOW (ref 6–20)
CO2: 26 mmol/L (ref 22–32)
Calcium: 8.6 mg/dL — ABNORMAL LOW (ref 8.9–10.3)
Chloride: 115 mmol/L — ABNORMAL HIGH (ref 98–111)
Creatinine, Ser: 1.03 mg/dL (ref 0.61–1.24)
GFR, Estimated: >60 mL/min (ref >60)
Glucose, Bld: 117 mg/dL — ABNORMAL HIGH (ref 70–99)
Potassium: 3.7 mmol/L (ref 3.5–5.1)
Sodium: 147 mmol/L — ABNORMAL HIGH (ref 135–145)
Total Bilirubin: 1.7 mg/dL — ABNORMAL HIGH (ref 0.3–1.2)
Total Protein: 7.1 g/dL (ref 6.5–8.1)

## 2022-02-07 LAB — RAPID URINE DRUG SCREEN, HOSP PERFORMED
Amphetamines: NOT DETECTED
Barbiturates: NOT DETECTED
Benzodiazepines: NOT DETECTED
Cocaine: NOT DETECTED
Opiates: NOT DETECTED
Tetrahydrocannabinol: NOT DETECTED

## 2022-02-07 LAB — RESP PANEL BY RT-PCR (FLU A&B, COVID) ARPGX2
Influenza A by PCR: NEGATIVE
Influenza B by PCR: NEGATIVE
SARS Coronavirus 2 by RT PCR: NEGATIVE

## 2022-02-07 LAB — ETHANOL: Alcohol, Ethyl (B): 319 mg/dL (ref <10)

## 2022-02-07 MED ORDER — CHLORDIAZEPOXIDE HCL 25 MG PO CAPS: ORAL_CAPSULE | ORAL | 0 refills | Status: AC

## 2022-02-07 MED ORDER — CHLORDIAZEPOXIDE HCL 25 MG PO CAPS
25.0000 mg | ORAL_CAPSULE | Freq: Once | ORAL | Status: AC
  Administered 2022-02-07: 25 mg via ORAL
  Filled 2022-02-07: qty 1

## 2022-02-07 MED ORDER — LORAZEPAM 1 MG PO TABS
1.0000 mg | ORAL_TABLET | Freq: Once | ORAL | Status: AC
  Administered 2022-02-07: 1 mg via ORAL
  Filled 2022-02-07: qty 1

## 2022-02-07 NOTE — ED Provider Triage Note (Signed)
Emergency Medicine Provider Triage Evaluation Note  Oneal Schoenberger , a 36 y.o. male  was evaluated in triage.  Pt complains of suicidal ideation.  He feels like he be better off sleeping forever, lives at home having struggles with his dad.  Drinks significant amount of alcohol a day, 48 ounces of 12% beer prior to arrival.  History of alcohol withdrawal, denies any plan of suicide.  No HI.  He has been taking his dad Seroquel which he is not prescribed, takes only 1 pill daily per patient..  Review of Systems  Per HPI  Physical Exam  BP (!) 130/93 (BP Location: Left Arm)   Pulse 97   Temp 98.5 F (36.9 C) (Oral)   Resp 18   SpO2 96%  Gen:   Awake, no distress   Resp:  Normal effort  MSK:   Moves extremities without difficulty  Other:  Mildly tachycardic, not agitated and no resting tremors.  Goal oriented speech.  Medical Decision Making  Medically screening exam initiated at 5:10 PM.  Appropriate orders placed.  Daniell Mancinas was informed that the remainder of the evaluation will be completed by another provider, this initial triage assessment does not replace that evaluation, and the importance of remaining in the ED until their evaluation is complete.     Theron Arista, PA-C 02/07/22 1710

## 2022-02-07 NOTE — ED Triage Notes (Signed)
Pt arrives with c/o SI and feelings of depression. Pt has been drinking 4-6 cups of alcohol a day. Pt also has been taking father's Seroquel. Pt denies HI. Pt is voluntary at this time.

## 2022-02-07 NOTE — ED Notes (Signed)
Patient denies SI just states "if I dont stop drinking I am going to die and I dont want to die".  Patient keeps stating "I dont have time for rehab I will lose my job".

## 2022-02-07 NOTE — ED Provider Notes (Signed)
[facility]EM[facility]GENCY [facility]PT Provider Note   CSN: [id number] Arrival date & time: 02/07/22  1615     History  Chief Complaint  Patient presents with   Suicidal    Larry Adams is a 36 y.o. male.  HPI     37 year old male comes in with chief complaint of alcohol withdrawal. Patient's chief complaint is not suicidal ideation, his chief complaint is alcoholism and is requesting detox.  Patient states that he was sober for about a month earlier this year.  He found a job, started making money and started drinking again.  He comes to the emergency room because he wants detox and sober up again.  He remembers last time when he was admitted to the hospital, he was advised that if he continues to drink he will die.   He has had severe withdrawal before including DTs that required admission.  He wants to safely detox.  He has not attended any counseling sessions or AA meetings in the past.  Patient states that he drank prior to ED arrival.  He is feeling a little jittery right now and was hoping we would give him some withdrawal medications.    Home Medications Prior to Admission medications   Medication Sig Start Date End Date Taking? Authorizing Provider  chlordiazePOXIDE (LIBRIUM) 25 MG capsule 50mg  PO TID x 1D, then 25-50mg  PO BID X 1D, then 25-50mg  PO QD X 1D 02/07/22  Yes Dyshawn Cangelosi, MD  carvedilol (COREG) 3.125 MG tablet Take 1 tablet (3.125 mg total) by mouth 2 (two) times daily with a meal. 12/06/21 01/05/22  Amin, 03/08/22, MD  furosemide (LASIX) 20 MG tablet Take 1 tablet (20 mg total) by mouth daily. 08/31/21   10/31/21, MD  losartan (COZAAR) 25 MG tablet Take 1 tablet (25 mg total) by mouth daily. 12/06/21 01/05/22  Amin, 03/08/22, MD  spironolactone (ALDACTONE) 25 MG tablet Take 0.5 tablets (12.5 mg total) by mouth daily. 12/06/21 01/05/22  03/08/22, MD      Allergies    Morphine and related and Vancomycin    Review of  Systems   Review of Systems  Physical Exam Updated Vital Signs BP (!) 130/93 (BP Location: Left Arm)   Pulse 97   Temp 98.5 F (36.9 C) (Oral)   Resp 18   Ht 6\' 1"  (1.854 m)   Wt 131.5 kg   SpO2 96%   BMI 38.26 kg/m  Physical Exam Vitals and nursing note reviewed.  Constitutional:      Appearance: He is well-developed.  HENT:     Head: Atraumatic.  Cardiovascular:     Rate and Rhythm: Normal rate.  Pulmonary:     Effort: Pulmonary effort is normal.  Musculoskeletal:     Cervical back: Neck supple.  Skin:    General: Skin is warm.  Neurological:     Mental Status: He is alert and oriented to person, place, and time.     ED Results / Procedures / Treatments   Labs (all labs ordered are listed, but only abnormal results are displayed) Labs Reviewed  CBC WITH DIFFERENTIAL/PLATELET - Abnormal; Notable for the following components:      Result Value   Lymphs Abs 4.5 (*)    All other components within normal limits  RESP PANEL BY RT-PCR (FLU A&B, COVID) ARPGX2  COMPREHENSIVE METABOLIC PANEL  ETHANOL  RAPID URINE DRUG SCREEN, HOSP PERFORMED    EKG None  Radiology No results found.  Procedures Procedures  Medications Ordered in ED Medications  chlordiazePOXIDE (LIBRIUM) capsule 25 mg (has no administration in time range)  LORazepam (ATIVAN) tablet 1 mg (has no administration in time range)    ED Course/ Medical Decision Making/ A&P                           Medical Decision Making Risk Prescription drug management.   36 year old male comes in with chief complaint of alcoholism and detox.  He has history of alcoholism and severe alcohol withdrawals.  On June 10, he was discharged from the hospital after having complications from his alcohol withdrawal.  At that time he was admitted to the hospital because of medical issues.  Patient had mentioned suicidal ideation in triage, but he indicates very clearly to me that he is seeking detox and wants to  sober up.  He indicates that he does not have time to go to [employer] or outpatient counseling.  He thinks that he derailed because he started making money.  Patient admits that he had alcoholic beverage prior to ED arrival.  He is clinically sober.  He is AOx3 and able to answer all my questions appropriately.  At this time patient does not need admission for DTs and we do not have any detox facility, we communicated to him that we can give him outpatient resources, and is requesting that we at least give him Librium taper.  Patient is stable for discharge.   Final Clinical Impression(s) / ED Diagnoses Final diagnoses:  Alcoholism (HCC)    Rx / DC Orders ED Discharge Orders          Ordered    chlordiazePOXIDE (LIBRIUM) 25 MG capsule        02/07/22 1802              Derwood Kaplan, MD 02/07/22 1847

## 2022-02-07 NOTE — Discharge Instructions (Addendum)
Please take the medications prescribed for alcohol withdrawal.  Below is the list of substance abuse treatment programs. Consider continued counseling through the services even when you are feeling better and are sober.  We have also provided you with contact information for our behavioral health urgent care.  This facility is a walk-in urgent care that can help folks with mental illness and addiction.  Substance Abuse Treatment Programs  Intensive Outpatient Programs [facility]     [address]      [place], [place]                   [phone]       [facility] [address] [place], [place] [phone]  [facility]     (Inpatient and outpatient)     [address]           [phone]    [facility] [phone] (Suboxone and Methadone)  [address]      [place], [place] [address]      [phone]       [address] [place], [place] [phone]  [facility] (Outpatient/Inpatient, [facility])    (insurance only) [phone]             [facility] (Groups & Residential) [place], [place] [phone]     [facility]     [address]     [place], [place]      [phone]       [facility] (for caregivers and family) [phone] [address] [place], [place] [phone]      Residential Treatment Programs [facility]      [address] [place], [place] [address]  [phone]       [facility] [address] [place], [place] [address] [phone]  [facility]        [phone]       [facility] [phone]  [facility]             [address]                                         [place], [place]                                                [phone] or [phone]                               [facility] [address] [place],  [address] [phone]  [facility]    [address]      [place], [place]     [phone]       [facility] [address] [place], [place] [phone]  [facility]   [address] [place], [place] [address]     [phone]      Admissions: 8am-3pm M-F  [facility] ([facility]) [address] [place], [place] [phone]  [facility]: Residential Program [phone] Days)   [place], [place]      [phone] or  [phone]     [facility]: [facility] [place], [place] (Walk in Hours over the weekend or by referral)  [facility] [address] [place], [place], [place] [address] [phone]  Crisis Mobile: [facility]:  [phone] (for crisis response 24 hours a day) [facility] Hotline:      [phone] Outpatient Psychiatry and Counseling  [facility]: Mobile Crisis Management 24 hours:  [phone]  [facility] sliding scale fee and walk in schedule: M-F 8am-12pm/1pm-3pm [address]  [place], [place] [address] [phone]  [facility] [address] [place], [place] [address] [phone]  [facility] new patient walk-in appointments available Monday - Friday 8am -3pm.          [address] [place], [place] [address] [phone] or crisis line- [phone]  [facility] Out[facility] Services/ Intensive Outpatient Therapy Program [address] [place], [place] [address] [phone]  [facility]                  Crisis Services      [phone] [address]     [place], [place] [address]                 [facility]   [facility] [phone]. [address] [place], [place] [address]   [facility]          [address]   [place], [place] [address]       [phone]  [facility] [address] [place], [place] [address] [phone]  [facility]    [address]    [place], [place] [address]     [phone]       Andee Poles, MD     [address]     [place] [address]     [phone]       [facility] [address] [place] [address]  [facility]     [address] [place], [place]      [phone]       [facility] Eulogio Ditch, MD [address] [place], [place] [address] [phone]  [facility]     [address]     [place], [place] [address]     [phone]       [facility] [address] [place], [place] [address] [phone] Resources for Temporary Residential Assistance/Crisis Centers  [facility]) M-F 8am-3pm   [address] [place], [place] [address]   [phone] Services include: laundry, barbering, support groups, case management, phone  & computer access, showers, AA/NA mtgs, mental health/substance abuse nurse, job skills class, disability information, VA assistance, spiritual classes, etc.

## 2022-04-02 ENCOUNTER — Encounter (HOSPITAL_COMMUNITY): Payer: Self-pay

## 2022-04-02 ENCOUNTER — Other Ambulatory Visit: Payer: Self-pay

## 2022-04-02 ENCOUNTER — Emergency Department (HOSPITAL_COMMUNITY): Payer: Commercial Managed Care - HMO

## 2022-04-02 ENCOUNTER — Inpatient Hospital Stay (HOSPITAL_COMMUNITY): Payer: Commercial Managed Care - HMO

## 2022-04-02 ENCOUNTER — Inpatient Hospital Stay (HOSPITAL_COMMUNITY)
Admission: EM | Admit: 2022-04-02 | Discharge: 2022-04-29 | DRG: 917 | Disposition: E | Payer: Commercial Managed Care - HMO | Attending: Pulmonary Disease | Admitting: Pulmonary Disease

## 2022-04-02 DIAGNOSIS — G9341 Metabolic encephalopathy: Secondary | ICD-10-CM | POA: Diagnosis present

## 2022-04-02 DIAGNOSIS — N179 Acute kidney failure, unspecified: Secondary | ICD-10-CM | POA: Diagnosis present

## 2022-04-02 DIAGNOSIS — R739 Hyperglycemia, unspecified: Secondary | ICD-10-CM | POA: Diagnosis present

## 2022-04-02 DIAGNOSIS — I468 Cardiac arrest due to other underlying condition: Secondary | ICD-10-CM | POA: Diagnosis present

## 2022-04-02 DIAGNOSIS — R748 Abnormal levels of other serum enzymes: Secondary | ICD-10-CM | POA: Diagnosis present

## 2022-04-02 DIAGNOSIS — F1410 Cocaine abuse, uncomplicated: Secondary | ICD-10-CM | POA: Diagnosis present

## 2022-04-02 DIAGNOSIS — R570 Cardiogenic shock: Secondary | ICD-10-CM | POA: Diagnosis present

## 2022-04-02 DIAGNOSIS — Z6841 Body Mass Index (BMI) 40.0 and over, adult: Secondary | ICD-10-CM

## 2022-04-02 DIAGNOSIS — Z9911 Dependence on respirator [ventilator] status: Secondary | ICD-10-CM

## 2022-04-02 DIAGNOSIS — E669 Obesity, unspecified: Secondary | ICD-10-CM | POA: Diagnosis present

## 2022-04-02 DIAGNOSIS — K7010 Alcoholic hepatitis without ascites: Secondary | ICD-10-CM | POA: Diagnosis present

## 2022-04-02 DIAGNOSIS — F419 Anxiety disorder, unspecified: Secondary | ICD-10-CM | POA: Diagnosis present

## 2022-04-02 DIAGNOSIS — R579 Shock, unspecified: Secondary | ICD-10-CM | POA: Diagnosis not present

## 2022-04-02 DIAGNOSIS — F32A Depression, unspecified: Secondary | ICD-10-CM | POA: Diagnosis present

## 2022-04-02 DIAGNOSIS — Z79899 Other long term (current) drug therapy: Secondary | ICD-10-CM

## 2022-04-02 DIAGNOSIS — D696 Thrombocytopenia, unspecified: Secondary | ICD-10-CM | POA: Diagnosis present

## 2022-04-02 DIAGNOSIS — Z66 Do not resuscitate: Secondary | ICD-10-CM | POA: Diagnosis not present

## 2022-04-02 DIAGNOSIS — Z881 Allergy status to other antibiotic agents status: Secondary | ICD-10-CM

## 2022-04-02 DIAGNOSIS — I469 Cardiac arrest, cause unspecified: Secondary | ICD-10-CM | POA: Diagnosis present

## 2022-04-02 DIAGNOSIS — E872 Acidosis, unspecified: Secondary | ICD-10-CM | POA: Diagnosis present

## 2022-04-02 DIAGNOSIS — Z20822 Contact with and (suspected) exposure to covid-19: Secondary | ICD-10-CM | POA: Diagnosis present

## 2022-04-02 DIAGNOSIS — I5021 Acute systolic (congestive) heart failure: Secondary | ICD-10-CM | POA: Diagnosis present

## 2022-04-02 DIAGNOSIS — R6521 Severe sepsis with septic shock: Secondary | ICD-10-CM | POA: Diagnosis present

## 2022-04-02 DIAGNOSIS — I5082 Biventricular heart failure: Secondary | ICD-10-CM | POA: Diagnosis present

## 2022-04-02 DIAGNOSIS — M549 Dorsalgia, unspecified: Secondary | ICD-10-CM | POA: Diagnosis present

## 2022-04-02 DIAGNOSIS — G931 Anoxic brain damage, not elsewhere classified: Secondary | ICD-10-CM | POA: Diagnosis present

## 2022-04-02 DIAGNOSIS — Z885 Allergy status to narcotic agent status: Secondary | ICD-10-CM

## 2022-04-02 DIAGNOSIS — E876 Hypokalemia: Secondary | ICD-10-CM | POA: Diagnosis present

## 2022-04-02 DIAGNOSIS — F1010 Alcohol abuse, uncomplicated: Secondary | ICD-10-CM | POA: Diagnosis present

## 2022-04-02 DIAGNOSIS — I472 Ventricular tachycardia, unspecified: Secondary | ICD-10-CM | POA: Diagnosis present

## 2022-04-02 DIAGNOSIS — T405X1A Poisoning by cocaine, accidental (unintentional), initial encounter: Principal | ICD-10-CM | POA: Diagnosis present

## 2022-04-02 DIAGNOSIS — J690 Pneumonitis due to inhalation of food and vomit: Secondary | ICD-10-CM | POA: Diagnosis present

## 2022-04-02 DIAGNOSIS — J9601 Acute respiratory failure with hypoxia: Secondary | ICD-10-CM | POA: Diagnosis present

## 2022-04-02 DIAGNOSIS — J9602 Acute respiratory failure with hypercapnia: Secondary | ICD-10-CM

## 2022-04-02 DIAGNOSIS — F17210 Nicotine dependence, cigarettes, uncomplicated: Secondary | ICD-10-CM | POA: Diagnosis present

## 2022-04-02 DIAGNOSIS — G8929 Other chronic pain: Secondary | ICD-10-CM | POA: Diagnosis present

## 2022-04-02 DIAGNOSIS — R34 Anuria and oliguria: Secondary | ICD-10-CM | POA: Diagnosis present

## 2022-04-02 DIAGNOSIS — A419 Sepsis, unspecified organism: Secondary | ICD-10-CM | POA: Diagnosis present

## 2022-04-02 DIAGNOSIS — Z87898 Personal history of other specified conditions: Secondary | ICD-10-CM

## 2022-04-02 DIAGNOSIS — I499 Cardiac arrhythmia, unspecified: Secondary | ICD-10-CM | POA: Diagnosis not present

## 2022-04-02 DIAGNOSIS — K7200 Acute and subacute hepatic failure without coma: Secondary | ICD-10-CM | POA: Diagnosis present

## 2022-04-02 DIAGNOSIS — Z8619 Personal history of other infectious and parasitic diseases: Secondary | ICD-10-CM

## 2022-04-02 LAB — GLUCOSE, CAPILLARY
Glucose-Capillary: 109 mg/dL — ABNORMAL HIGH (ref 70–99)
Glucose-Capillary: 123 mg/dL — ABNORMAL HIGH (ref 70–99)

## 2022-04-02 LAB — I-STAT CHEM 8, ED
BUN: 4 mg/dL — ABNORMAL LOW (ref 6–20)
Calcium, Ion: 1.19 mmol/L (ref 1.15–1.40)
Chloride: 111 mmol/L (ref 98–111)
Creatinine, Ser: 1.5 mg/dL — ABNORMAL HIGH (ref 0.61–1.24)
Glucose, Bld: 143 mg/dL — ABNORMAL HIGH (ref 70–99)
HCT: 43 % (ref 39.0–52.0)
Hemoglobin: 14.6 g/dL (ref 13.0–17.0)
Potassium: 3.4 mmol/L — ABNORMAL LOW (ref 3.5–5.1)
Sodium: 148 mmol/L — ABNORMAL HIGH (ref 135–145)
TCO2: 22 mmol/L (ref 22–32)

## 2022-04-02 LAB — I-STAT ARTERIAL BLOOD GAS, ED
Acid-base deficit: 11 mmol/L — ABNORMAL HIGH (ref 0.0–2.0)
Bicarbonate: 20.3 mmol/L (ref 20.0–28.0)
Calcium, Ion: 1.15 mmol/L (ref 1.15–1.40)
HCT: 41 % (ref 39.0–52.0)
Hemoglobin: 13.9 g/dL (ref 13.0–17.0)
O2 Saturation: 90 %
Potassium: 3.1 mmol/L — ABNORMAL LOW (ref 3.5–5.1)
Sodium: 153 mmol/L — ABNORMAL HIGH (ref 135–145)
TCO2: 23 mmol/L (ref 22–32)
pCO2 arterial: 73.9 mmHg (ref 32–48)
pH, Arterial: 7.047 — CL (ref 7.35–7.45)
pO2, Arterial: 85 mmHg (ref 83–108)

## 2022-04-02 LAB — POCT I-STAT EG7
Acid-base deficit: 13 mmol/L — ABNORMAL HIGH (ref 0.0–2.0)
Bicarbonate: 17.4 mmol/L — ABNORMAL LOW (ref 20.0–28.0)
Calcium, Ion: 1.03 mmol/L — ABNORMAL LOW (ref 1.15–1.40)
HCT: 43 % (ref 39.0–52.0)
Hemoglobin: 14.6 g/dL (ref 13.0–17.0)
O2 Saturation: 98 %
Potassium: 3.1 mmol/L — ABNORMAL LOW (ref 3.5–5.1)
Sodium: 148 mmol/L — ABNORMAL HIGH (ref 135–145)
TCO2: 19 mmol/L — ABNORMAL LOW (ref 22–32)
pCO2, Ven: 57.1 mmHg (ref 44–60)
pH, Ven: 7.092 — CL (ref 7.25–7.43)
pO2, Ven: 138 mmHg — ABNORMAL HIGH (ref 32–45)

## 2022-04-02 LAB — COMPREHENSIVE METABOLIC PANEL
ALT: 47 U/L — ABNORMAL HIGH (ref 0–44)
AST: 196 U/L — ABNORMAL HIGH (ref 15–41)
Albumin: 2.5 g/dL — ABNORMAL LOW (ref 3.5–5.0)
Alkaline Phosphatase: 137 U/L — ABNORMAL HIGH (ref 38–126)
Anion gap: 16 — ABNORMAL HIGH (ref 5–15)
BUN: 7 mg/dL (ref 6–20)
CO2: 11 mmol/L — ABNORMAL LOW (ref 22–32)
Calcium: 8.5 mg/dL — ABNORMAL LOW (ref 8.9–10.3)
Chloride: 117 mmol/L — ABNORMAL HIGH (ref 98–111)
Creatinine, Ser: 1.29 mg/dL — ABNORMAL HIGH (ref 0.61–1.24)
GFR, Estimated: >60 mL/min (ref >60)
Glucose, Bld: 98 mg/dL (ref 70–99)
Potassium: 3.6 mmol/L (ref 3.5–5.1)
Sodium: 144 mmol/L (ref 135–145)
Total Bilirubin: 1.2 mg/dL (ref 0.3–1.2)
Total Protein: 6.5 g/dL (ref 6.5–8.1)

## 2022-04-02 LAB — CBC
HCT: 47.7 % (ref 39.0–52.0)
Hemoglobin: 14.6 g/dL (ref 13.0–17.0)
MCH: 34 pg (ref 26.0–34.0)
MCHC: 30.6 g/dL (ref 30.0–36.0)
MCV: 110.9 fL — ABNORMAL HIGH (ref 80.0–100.0)
Platelets: 122 10*3/uL — ABNORMAL LOW (ref 150–400)
RBC: 4.3 MIL/uL (ref 4.22–5.81)
RDW: 17.2 % — ABNORMAL HIGH (ref 11.5–15.5)
WBC: 19.4 10*3/uL — ABNORMAL HIGH (ref 4.0–10.5)
nRBC: 0.8 % — ABNORMAL HIGH (ref 0.0–0.2)

## 2022-04-02 LAB — HEMOGLOBIN A1C
Hgb A1c MFr Bld: 5.1 % (ref 4.8–5.6)
Mean Plasma Glucose: 99.67 mg/dL

## 2022-04-02 LAB — ETHANOL: Alcohol, Ethyl (B): 169 mg/dL — ABNORMAL HIGH (ref <10)

## 2022-04-02 LAB — SALICYLATE LEVEL: Salicylate Lvl: <7 mg/dL — ABNORMAL LOW (ref 7.0–30.0)

## 2022-04-02 LAB — ACETAMINOPHEN LEVEL: Acetaminophen (Tylenol), Serum: <10 ug/mL — ABNORMAL LOW (ref 10–30)

## 2022-04-02 LAB — CBG MONITORING, ED: Glucose-Capillary: 119 mg/dL — ABNORMAL HIGH (ref 70–99)

## 2022-04-02 LAB — MRSA NEXT GEN BY PCR, NASAL: MRSA by PCR Next Gen: NOT DETECTED

## 2022-04-02 LAB — PROTIME-INR
INR: 1.7 — ABNORMAL HIGH (ref 0.8–1.2)
Prothrombin Time: 20.2 seconds — ABNORMAL HIGH (ref 11.4–15.2)

## 2022-04-02 LAB — HIV ANTIBODY (ROUTINE TESTING W REFLEX): HIV Screen 4th Generation wRfx: NONREACTIVE

## 2022-04-02 LAB — SARS CORONAVIRUS 2 BY RT PCR: SARS Coronavirus 2 by RT PCR: NEGATIVE

## 2022-04-02 LAB — MAGNESIUM: Magnesium: 3 mg/dL — ABNORMAL HIGH (ref 1.7–2.4)

## 2022-04-02 LAB — TROPONIN I (HIGH SENSITIVITY): Troponin I (High Sensitivity): 1903 ng/L (ref <18)

## 2022-04-02 LAB — APTT: aPTT: 48 seconds — ABNORMAL HIGH (ref 24–36)

## 2022-04-02 LAB — PHOSPHORUS: Phosphorus: 7 mg/dL — ABNORMAL HIGH (ref 2.5–4.6)

## 2022-04-02 MED ORDER — TENECTEPLASE 50 MG IV KIT
50.0000 mg | PACK | INTRAVENOUS | Status: AC
  Administered 2022-04-02: 50 mg via INTRAVENOUS
  Filled 2022-04-02: qty 10

## 2022-04-02 MED ORDER — SODIUM CHLORIDE 0.9 % IV SOLN
2.0000 g | INTRAVENOUS | Status: DC
  Administered 2022-04-02: 2 g via INTRAVENOUS
  Filled 2022-04-02: qty 20

## 2022-04-02 MED ORDER — EPINEPHRINE HCL 5 MG/250ML IV SOLN IN NS
0.5000 ug/min | INTRAVENOUS | Status: DC
  Administered 2022-04-02: 50 ug/min via INTRAVENOUS
  Administered 2022-04-02: 20 ug/min via INTRAVENOUS
  Administered 2022-04-02: 40 ug/min via INTRAVENOUS
  Administered 2022-04-03 (×2): 20 ug/min via INTRAVENOUS
  Filled 2022-04-02 (×2): qty 250
  Filled 2022-04-02: qty 500

## 2022-04-02 MED ORDER — DEXTROSE 5 % IV SOLN
INTRAVENOUS | Status: AC | PRN
  Administered 2022-04-02: 300 mg via INTRAVENOUS

## 2022-04-02 MED ORDER — ACETAMINOPHEN 325 MG PO TABS: 650.0000 mg | ORAL_TABLET | ORAL | Status: DC | PRN

## 2022-04-02 MED ORDER — EPINEPHRINE 1 MG/10ML IJ SOSY
PREFILLED_SYRINGE | INTRAMUSCULAR | Status: AC | PRN
  Administered 2022-04-02 (×7): 1 mg via INTRAVENOUS

## 2022-04-02 MED ORDER — HEPARIN SODIUM (PORCINE) 5000 UNIT/ML IJ SOLN
5000.0000 [IU] | Freq: Three times a day (TID) | INTRAMUSCULAR | Status: DC
  Administered 2022-04-03 – 2022-04-04 (×6): 5000 [IU] via SUBCUTANEOUS
  Filled 2022-04-02 (×6): qty 1

## 2022-04-02 MED ORDER — VITAL HIGH PROTEIN PO LIQD: 1000.0000 mL | ORAL | Status: DC

## 2022-04-02 MED ORDER — AMIODARONE HCL IN DEXTROSE 360-4.14 MG/200ML-% IV SOLN
60.0000 mg/h | INTRAVENOUS | Status: DC
  Administered 2022-04-02: 30 mg/h via INTRAVENOUS
  Administered 2022-04-03 – 2022-04-05 (×8): 60 mg/h via INTRAVENOUS
  Filled 2022-04-02 (×8): qty 200

## 2022-04-02 MED ORDER — SODIUM BICARBONATE 8.4 % IV SOLN
100.0000 meq | Freq: Once | INTRAVENOUS | Status: AC
  Administered 2022-04-02: 100 meq via INTRAVENOUS

## 2022-04-02 MED ORDER — NOREPINEPHRINE 4 MG/250ML-% IV SOLN
0.0000 ug/min | INTRAVENOUS | Status: DC
  Administered 2022-04-02: 20 ug/min via INTRAVENOUS
  Administered 2022-04-02: 44 ug/min via INTRAVENOUS
  Administered 2022-04-02: 40 ug/min via INTRAVENOUS
  Administered 2022-04-03 (×7): 60 ug/min via INTRAVENOUS
  Filled 2022-04-02 (×8): qty 250

## 2022-04-02 MED ORDER — ACETAMINOPHEN 160 MG/5ML PO SOLN: 650.0000 mg | ORAL | Status: DC | PRN

## 2022-04-02 MED ORDER — THIAMINE MONONITRATE 100 MG PO TABS
100.0000 mg | ORAL_TABLET | Freq: Every day | ORAL | Status: DC
  Administered 2022-04-02 – 2022-04-04 (×3): 100 mg
  Filled 2022-04-02 (×3): qty 1

## 2022-04-02 MED ORDER — SODIUM CHLORIDE 0.9 % IV SOLN: INTRAVENOUS | Status: DC

## 2022-04-02 MED ORDER — EPINEPHRINE 1 MG/10ML IJ SOSY
PREFILLED_SYRINGE | INTRAMUSCULAR | Status: AC | PRN
  Administered 2022-04-02 (×2): 1 mg via INTRAVENOUS

## 2022-04-02 MED ORDER — PANTOPRAZOLE 2 MG/ML SUSPENSION
40.0000 mg | Freq: Every day | ORAL | Status: DC
  Administered 2022-04-02 – 2022-04-04 (×3): 40 mg
  Filled 2022-04-02 (×4): qty 20

## 2022-04-02 MED ORDER — FOLIC ACID 1 MG PO TABS
1.0000 mg | ORAL_TABLET | Freq: Every day | ORAL | Status: DC
  Administered 2022-04-02 – 2022-04-04 (×3): 1 mg
  Filled 2022-04-02 (×3): qty 1

## 2022-04-02 MED ORDER — MAGNESIUM SULFATE 2 GM/50ML IV SOLN
2.0000 g | Freq: Once | INTRAVENOUS | Status: DC
  Administered 2022-04-02: 2 g via INTRAVENOUS
  Filled 2022-04-02: qty 50

## 2022-04-02 MED ORDER — SODIUM BICARBONATE 8.4 % IV SOLN
50.0000 meq | Freq: Once | INTRAVENOUS | Status: DC
  Filled 2022-04-02: qty 100

## 2022-04-02 MED ORDER — MAGNESIUM SULFATE 50 % IJ SOLN
INTRAMUSCULAR | Status: AC | PRN
  Administered 2022-04-02: 4 g via INTRAVENOUS

## 2022-04-02 MED ORDER — SODIUM CHLORIDE 0.9 % IV BOLUS
1000.0000 mL | Freq: Once | INTRAVENOUS | Status: AC
  Administered 2022-04-02: 1000 mL via INTRAVENOUS

## 2022-04-02 MED ORDER — EPINEPHRINE 1 MG/10ML IJ SOSY
PREFILLED_SYRINGE | INTRAMUSCULAR | Status: AC
  Filled 2022-04-02: qty 20

## 2022-04-02 MED ORDER — ONDANSETRON HCL 4 MG/2ML IJ SOLN: 4.0000 mg | Freq: Four times a day (QID) | INTRAMUSCULAR | Status: DC | PRN

## 2022-04-02 MED ORDER — ACETAMINOPHEN 650 MG RE SUPP: 650.0000 mg | RECTAL | Status: DC | PRN

## 2022-04-02 MED ORDER — NALOXONE HCL 2 MG/2ML IJ SOSY
PREFILLED_SYRINGE | INTRAMUSCULAR | Status: AC | PRN
  Administered 2022-04-02: 2 mg via INTRAVENOUS

## 2022-04-02 MED ORDER — PROSOURCE TF20 ENFIT COMPATIBL EN LIQD
60.0000 mL | Freq: Every day | ENTERAL | Status: DC
  Administered 2022-04-02: 60 mL
  Filled 2022-04-02: qty 60

## 2022-04-02 MED ORDER — DOCUSATE SODIUM 100 MG PO CAPS: 100.0000 mg | ORAL_CAPSULE | Freq: Two times a day (BID) | ORAL | Status: DC | PRN

## 2022-04-02 MED ORDER — VASOPRESSIN 20 UNITS/100 ML INFUSION FOR SHOCK
0.0000 [IU]/min | INTRAVENOUS | Status: DC
  Administered 2022-04-02 – 2022-04-05 (×6): 0.03 [IU]/min via INTRAVENOUS
  Filled 2022-04-02 (×7): qty 100

## 2022-04-02 MED ORDER — SODIUM BICARBONATE 8.4 % IV SOLN
INTRAVENOUS | Status: AC | PRN
  Administered 2022-04-02 (×2): 100 meq via INTRAVENOUS

## 2022-04-02 MED ORDER — CALCIUM CHLORIDE 10 % IV SOLN
INTRAVENOUS | Status: AC | PRN
  Administered 2022-04-02: 1 g via INTRAVENOUS

## 2022-04-02 MED ORDER — POTASSIUM CHLORIDE 10 MEQ/100ML IV SOLN
10.0000 meq | INTRAVENOUS | Status: AC
  Administered 2022-04-02 – 2022-04-03 (×4): 10 meq via INTRAVENOUS
  Filled 2022-04-02 (×4): qty 100

## 2022-04-02 MED ORDER — ADULT MULTIVITAMIN LIQUID CH
15.0000 mL | Freq: Every day | ORAL | Status: DC
  Administered 2022-04-02 – 2022-04-04 (×3): 15 mL
  Filled 2022-04-02 (×5): qty 15

## 2022-04-02 MED ORDER — INSULIN ASPART 100 UNIT/ML IJ SOLN
0.0000 [IU] | INTRAMUSCULAR | Status: DC
  Administered 2022-04-02 – 2022-04-05 (×2): 3 [IU] via SUBCUTANEOUS

## 2022-04-02 MED ORDER — POLYETHYLENE GLYCOL 3350 17 G PO PACK: 17.0000 g | PACK | Freq: Every day | ORAL | Status: DC | PRN

## 2022-04-02 MED ORDER — POTASSIUM CHLORIDE 20 MEQ PO PACK: 40.0000 meq | PACK | Freq: Once | ORAL | Status: DC

## 2022-04-02 MED ORDER — LACTATED RINGERS IV BOLUS: 1000.0000 mL | Freq: Once | INTRAVENOUS | Status: DC

## 2022-04-02 MED ORDER — DOCUSATE SODIUM 50 MG/5ML PO LIQD: 100.0000 mg | Freq: Two times a day (BID) | ORAL | Status: DC | PRN

## 2022-04-02 MED ORDER — POLYETHYLENE GLYCOL 3350 17 G PO PACK: 17.0000 g | PACK | Freq: Every day | ORAL | Status: DC

## 2022-04-02 MED ORDER — AMIODARONE HCL IN DEXTROSE 360-4.14 MG/200ML-% IV SOLN
60.0000 mg/h | INTRAVENOUS | Status: AC
  Administered 2022-04-02: 60 mg/h via INTRAVENOUS
  Filled 2022-04-02: qty 200

## 2022-04-02 MED ORDER — DOCUSATE SODIUM 50 MG/5ML PO LIQD: 100.0000 mg | Freq: Two times a day (BID) | ORAL | Status: DC

## 2022-04-02 MED ORDER — SODIUM BICARBONATE 8.4 % IV SOLN
INTRAVENOUS | Status: DC
  Filled 2022-04-02 (×4): qty 1000
  Filled 2022-04-02: qty 150
  Filled 2022-04-02 (×7): qty 1000

## 2022-04-02 NOTE — H&P (Signed)
NAME:  Larry Adams, MRN:  [id number], DOB:  1986-02-10, LOS: 0 ADMISSION DATE:  04-08-2022, CONSULTATION DATE:  10/5 REFERRING MD:  Dr. Ashok Cordia, CHIEF COMPLAINT:  Cardiac arrest   History of Present Illness:  35 year old male with PMH as below, which is significant for drug abuse, alcohol abuse, hepatitis C. 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 During that admission he was found to have LVEF 45% and was evaluated by cardiology. Started on coreg, losartan, aldactone, and lasix.  Unfortunately he failed to follow-up with cardiology.  On 10/5 he was found down by his father with a needle nearby.  Father called EMS and initiated bystander CPR.  Upon EMS arrival patient remained in PEA and ACLS was initiated.  The patient underwent about 30 minutes of resuscitation by EMS.  Upon arrival to the emergency department CPR was ongoing.  Patient was intubated in the emergency department. Had an additional 30 minutes of intermittent ACLS in the ED. He did briefly have VF treated with amiodarone bolus and infusion. Towards the end of resuscitation he was given thrombolytic as salvage therapy. He seemed to respond well. Once ROSC was sustained PCCM was asked to admit.   Pertinent  Medical History   has a past medical history of Anxiety, Back pain, Depression, Drug overdose, and Mental disorder.   Significant Hospital Events: Including procedures, antibiotic start and stop dates in addition to other pertinent events   10/5 admit to ICU after prolonged cardiac arrest.   Interim History / Subjective:    Objective   Blood pressure 102/72, pulse (!) 101, resp. rate (!) 24, SpO2 99 %.    Vent Mode: PRVC FiO2 (%):  [100 %] 100 % Set Rate:  [24 bmp] 24 bmp Vt Set:  [630 mL] 630 mL PEEP:  [8 cmH20] 8 cmH20  No intake or output data in the 24 hours ending 08-Apr-2022 1936 There were no vitals filed for this visit.  Examination: General: Obese adult male in NAD HENT: [place]/AT, pupils 6 mm and  fixed.  Lungs: Clear bilateral breath sounds Cardiovascular: RRR, no MRG Abdomen: Soft, non-distended Extremities: No acute deformity Neuro: Coma. No cough or gag. No pain response.   Resolved Hospital Problem list     Assessment & Plan:   Cardiac arrest: one hour plus of intermittent PEA/Asystole. Brief VT. Likely due to overdose considering history and syringe/needle found on scene. Did have areas of ST elevation on serial EKG, however, in different leads each time. Cardiology consulted by EDP without plans for intervention. Given TNK during code with concern for PE. Significant concern for anoxic injury. - CT head - EEG - TTM protocol - Amiodarone bolus - Neuro protective measures.   Cardiogenic shock: Known LVEF 45% from previous admission. Now markedly worse by POCUS. - Epinephrine and norepinephrine infusions to keep MAP > 65 mmHg - Echocardiogram - Trend troponin, lactic acid - Will need central access  Acute hypoxemic respiratory failure Pulmonary edema - Full vent support - VAP bundle - Not suitable for diuresis at this time - Follow ABG, CXR  Metabolic acidosis: pH 7.0 on presentation. Likely lactic  - check lactic - bicarb drip - repeat ABG   AKI Hypokalemia - Awaiting lab chemistry - replete K based on istat - mag 2G   Best Practice (right click and "Reselect all SmartList Selections" daily)   Diet/type: NPO DVT prophylaxis: prophylactic heparin  GI prophylaxis: PPI Lines: N/A Foley:  Yes, and it is still needed Code Status:  full code Last date of multidisciplinary goals of care discussion []   Labs   CBC: Recent Labs  Lab 04/10/2022 1847 04/12/2022 1848  HGB 14.6 13.9  HCT 43.0 [id number]    Basic Metabolic Panel: Recent Labs  Lab 04/04/2022 1847 04/08/2022 1848  NA 148* 153*  K 3.4* 3.1*  CL 111  --   GLUCOSE 143*  --   BUN 4*  --   CREATININE 1.50*  --    GFR: CrCl cannot be calculated (Unknown ideal weight.). No results for input(s):  "PROCALCITON", "WBC", "LATICACIDVEN" in the last 168 hours.  Liver Function Tests: No results for input(s): "AST", "ALT", "ALKPHOS", "BILITOT", "PROT", "ALBUMIN" in the last 168 hours. No results for input(s): "LIPASE", "AMYLASE" in the last 168 hours. No results for input(s): "AMMONIA" in the last 168 hours.  ABG    Component Value Date/Time   PHART 7.047 (LL) 04/17/2022 1848   PCO2ART 73.9 (HH) 04/17/2022 1848   PO2ART 85 04/04/2022 1848   HCO3 20.3 04/16/2022 1848   TCO2 23 04/25/2022 1848   ACIDBASEDEF 11.0 (H) 04/15/2022 1848   O2SAT 90 04/07/2022 1848     Coagulation Profile: No results for input(s): "INR", "PROTIME" in the last 168 hours.  Cardiac Enzymes: No results for input(s): "CKTOTAL", "CKMB", "CKMBINDEX", "TROPONINI" in the last 168 hours.  HbA1C: Hgb A1c MFr Bld  Date/Time Value Ref Range Status  04/18/2020 06:20 AM 5.2 4.8 - 5.6 % Final    Comment:    (NOTE)         Prediabetes: 5.7 - 6.4         Diabetes: >6.4         Glycemic control for adults with diabetes: <7.0     CBG: Recent Labs  Lab 04/27/2022 1805  GLUCAP 119*    Review of Systems:   Patient is encephalopathic and/or intubated. Therefore history has been obtained from chart review.    Past Medical History:  He,  has a past medical history of Anxiety, Back pain, Depression, Drug overdose, and Mental disorder.   Surgical History:   Past Surgical History:  Procedure Laterality Date   HERNIA REPAIR       Social History:   reports that he has been smoking cigarettes. He has been smoking an average of 1 pack per day. He has quit using smokeless tobacco. He reports current alcohol use of about 6.0 standard drinks of alcohol per week. He reports that he does not currently use drugs after having used the following drugs: IV and Fentanyl.   Family History:  His Family history is unknown by patient.   Allergies Allergies  Allergen Reactions   Morphine And Related Hives   Vancomycin  Itching and Other (See Comments)    "Red man syndrome"      Home Medications  Prior to Admission medications   Medication Sig Start Date End Date Taking? Authorizing Provider  carvedilol (COREG) 3.125 MG tablet Take 1 tablet (3.125 mg total) by mouth 2 (two) times daily with a meal. 12/06/21 01/05/22  Amin, Jeanella Flattery, MD  chlordiazePOXIDE (LIBRIUM) 25 MG capsule 50mg  PO TID x 1D, then 25-50mg  PO BID X 1D, then 25-50mg  PO QD X 1D 02/07/22   Varney Biles, MD  furosemide (LASIX) 20 MG tablet Take 1 tablet (20 mg total) by mouth daily. 08/31/21   Fredia Sorrow, MD  losartan (COZAAR) 25 MG tablet Take 1 tablet (25 mg total) by mouth daily. 12/06/21 01/05/22  Damita Lack, MD  spironolactone (ALDACTONE) 25 MG tablet Take 0.5 tablets (12.5 mg total) by mouth daily. 12/06/21 01/05/22  Damita Lack, MD     Critical care time: 22 minutes     Georgann Housekeeper, AGACNP-BC [facility]  See Amion for personal pager PCCM on call pager [phone] until 7pm. Please call Elink 7p-7a. [phone]  03/30/2022 8:13 PM

## 2022-04-02 NOTE — ED Notes (Signed)
Admitting ICU at bedside  

## 2022-04-02 NOTE — IPAL (Signed)
  Interdisciplinary Goals of Care Family Meeting   Date carried out:: 04/21/2022  Location of the meeting: Conference room  Member's involved: Physician and Family Member or next of kin  Durable Power of Attorney or Loss adjuster, chartered: both parents    Discussion: We discussed goals of care for [name] .  His parents were updated on his care. His parents are obviously devastated, especially since he had been doing well and they do not think he had been using drugs recently. They understand that he is on life support and may have suffered a severe neurological injury from his cardiac arrest that may mean he is unable to regain consciousness. They wish to continue full aggressive care and remain full code. Chaplain to escort family to bedside.   Code status: Full Code  Disposition: Continue current acute care   Time spent for the meeting: 10 min.  Julian Hy 04/13/2022, 7:37 PM

## 2022-04-02 NOTE — ED Provider Notes (Signed)
[facility] EMERGENCY DEPARTMENT Provider Note   CSN: [id number] Arrival date & time: 04/13/2022  1802     History  Chief complaint: unresponsive, cpr   Larry Adams is a 36 y.o. male.  Patient presents via EMS as 'CPR in progress'.  Per report, unwitnessed arrest, pt found unresponsive, apneic, pulseless w PEA rhythm, no response to stimuli.  EMS initiated CPR, indicated initial CBG low 100's, placed [facility] airway. EMS indicates after 5-10 minutes of CPR and epi, brief return of pulses.  EKG then with sinus tachy, st elev III, AVF.  Pt quickly lost pulses w return of CPR and additional epi. On arrival to ED, CPR in progress for ~ 30 minutes. Pt remains apneic, no response to stimuli, no pulses. CPR continued.   The history is provided by the patient, medical records and the EMS personnel. The history is limited by the condition of the patient.       Home Medications Prior to Admission medications   Medication Sig Start Date End Date Taking? Authorizing Provider  carvedilol (COREG) 3.125 MG tablet Take 1 tablet (3.125 mg total) by mouth 2 (two) times daily with a meal. 12/06/21 01/05/22  Amin, Jeanella Flattery, MD  chlordiazePOXIDE (LIBRIUM) 25 MG capsule 50mg  PO TID x 1D, then 25-50mg  PO BID X 1D, then 25-50mg  PO QD X 1D 02/07/22   Varney Biles, MD  furosemide (LASIX) 20 MG tablet Take 1 tablet (20 mg total) by mouth daily. 08/31/21   Fredia Sorrow, MD  losartan (COZAAR) 25 MG tablet Take 1 tablet (25 mg total) by mouth daily. 12/06/21 01/05/22  Amin, Jeanella Flattery, MD  spironolactone (ALDACTONE) 25 MG tablet Take 0.5 tablets (12.5 mg total) by mouth daily. 12/06/21 01/05/22  Damita Lack, MD      Allergies    Morphine and related and Vancomycin    Review of Systems   Review of Systems  Unable to perform ROS: Patient unresponsive    Physical Exam Updated Vital Signs BP 100/73   Pulse (!) 109   Resp (!) 24   SpO2 95%  Physical Exam Vitals and nursing note  reviewed.  Constitutional:      Appearance: He is well-developed.     Comments: Unresponsive, cpr.   HENT:     Head: Atraumatic.     Nose: Nose normal.     Mouth/Throat:     Comments: King airway Eyes:     General: No scleral icterus.    Conjunctiva/sclera: Conjunctivae normal.     Comments: Pupils 3-4 mm, not responsive.   Neck:     Trachea: No tracheal deviation.     Comments: Trachea midline.  Cardiovascular:     Comments: Pulseless, no audible cardiac activity.  Pulmonary:     Effort: No accessory muscle usage.     Comments: Bag ventilate via King, bil bs.  Abdominal:     Palpations: Abdomen is soft. There is no mass.     Comments: No severe distension noted.   Genitourinary:    Comments: Normal external gu exam.  Musculoskeletal:        General: No swelling.     Cervical back: Neck supple.     Comments: C spine aligned, no gross step off noted. No gross bony deformity on extremity exam. No gross asymmetric edema noted.   Skin:    General: Skin is warm and dry.     Findings: No rash.  Neurological:     Comments: Unresponsive. Pulseless, apneic, no  response to stimuli.      ED Results / Procedures / Treatments   Labs (all labs ordered are listed, but only abnormal results are displayed) Results for orders placed or performed during the hospital encounter of 04/09/2022  CBG monitoring, ED  Result Value Ref Range   Glucose-Capillary 119 (H) 70 - 99 mg/dL  I-stat chem 8, ED  Result Value Ref Range   Sodium 148 (H) 135 - 145 mmol/L   Potassium 3.4 (L) 3.5 - 5.1 mmol/L   Chloride 111 98 - 111 mmol/L   BUN 4 (L) 6 - 20 mg/dL   Creatinine, Ser 1.50 (H) 0.61 - 1.24 mg/dL   Glucose, Bld 143 (H) 70 - 99 mg/dL   Calcium, Ion 1.19 1.15 - 1.40 mmol/L   TCO2 22 22 - 32 mmol/L   Hemoglobin 14.6 13.0 - 17.0 g/dL   HCT 43.0 39.0 - 52.0 %  I-Stat arterial blood gas, ED [facility] ED only)  Result Value Ref Range   pH, Arterial 7.047 (LL) 7.35 - 7.45   pCO2 arterial 73.9 (HH) 32 -  48 mmHg   pO2, Arterial 85 83 - 108 mmHg   Bicarbonate 20.3 20.0 - 28.0 mmol/L   TCO2 23 22 - 32 mmol/L   O2 Saturation 90 %   Acid-base deficit 11.0 (H) 0.0 - 2.0 mmol/L   Sodium 153 (H) 135 - 145 mmol/L   Potassium 3.1 (L) 3.5 - 5.1 mmol/L   Calcium, Ion 1.15 1.15 - 1.40 mmol/L   HCT 41.0 39.0 - 52.0 %   Hemoglobin 13.9 13.0 - 17.0 g/dL   Collection site RADIAL, ALLEN'S TEST ACCEPTABLE    Drawn by Operator    Sample type ARTERIAL    Comment NOTIFIED PHYSICIAN      EKG EKG Interpretation  Date/Time:  Thursday April 02 2022 18:46:03 EDT Ventricular Rate:  96 PR Interval:  137 QRS Duration: 103 QT Interval:  379 QTC Calculation: 479 R Axis:   74 Text Interpretation: Sinus rhythm `st elevation v5-v6 w st dep v3-v4  ?stemi Confirmed by Lajean Saver [phone]) on 04/10/2022 6:58:21 PM  Radiology DG Abd 1 View  Result Date: 04/13/2022 CLINICAL DATA:  Orogastric tube placement EXAM: ABDOMEN - 1 VIEW COMPARISON:  None Available. FINDINGS: Orogastric tube tip is seen within the proximal body of the stomach. The visualized abdominal gas pattern is nonobstructive. Pelvis excluded from view. No gross free intraperitoneal gas. IMPRESSION: Orogastric tube tip within the proximal body of the stomach. Electronically Signed   By: Fidela Salisbury M.D.   On: 04/07/2022 19:16   DG Chest Port 1 View  Result Date: 04/04/2022 CLINICAL DATA:  Arrest. EXAM: PORTABLE CHEST 1 VIEW COMPARISON:  Chest x-ray 12/02/2021 FINDINGS: Endotracheal tube tip is 3.8 cm above the carina. Enteric tube extends below the diaphragm. Multiple lines and tubes overlie the chest. The heart is enlarged. There is central pulmonary vascular congestion. Lung volumes are low. No focal lung consolidation, pleural effusion or pneumothorax. No acute fractures are seen. IMPRESSION: 1. Endotracheal tube tip is 3.8 cm above the carina. 2. Cardiomegaly with central pulmonary vascular congestion. Electronically Signed   By: Ronney Asters  M.D.   On: 04/18/2022 19:15    Procedures Procedures    Medications Ordered in ED Medications  amiodarone (NEXTERONE PREMIX) 360-4.14 MG/200ML-% (1.8 mg/mL) IV infusion (has no administration in time range)  amiodarone (NEXTERONE PREMIX) 360-4.14 MG/200ML-% (1.8 mg/mL) IV infusion (has no administration in time range)  0.9 %  sodium chloride  infusion (has no administration in time range)  tenecteplase (TNKASE) injection for PE/MI 50 mg (has no administration in time range)  norepinephrine (LEVOPHED) 4mg  in 263mL (0.016 mg/mL) premix infusion (has no administration in time range)  EPINEPHrine (ADRENALIN) 5 mg in NS 250 mL (0.02 mg/mL) premix infusion (has no administration in time range)  sodium bicarbonate 150 mEq in dextrose 5 % 1,150 mL infusion (has no administration in time range)  sodium chloride 0.9 % bolus 1,000 mL (has no administration in time range)  EPINEPHrine (ADRENALIN) 1 MG/10ML injection (1 mg Intravenous Given 04-08-2022 1849)  naloxone [facility]) injection (2 mg Intravenous Given 04-08-22 1801)  EPINEPHrine (ADRENALIN) 1 MG/10ML injection (1 mg Intravenous Given 04-08-2022 1812)  magnesium sulfate (IV Push/IM) injection (4 g Intravenous Given 2022/04/08 1811)  amiodarone (CORDARONE) 300 mg in dextrose 5 % 6 mL bolus (300 mg Intravenous New Bag/Given 08-Apr-2022 1811)  sodium bicarbonate injection (100 mEq Intravenous Given 04-08-22 1839)  calcium chloride injection (1 g Intravenous Given 04-08-2022 1823)    ED Course/ Medical Decision Making/ A&P                           Medical Decision Making Problems Addressed: Acute respiratory failure with hypoxia and hypercapnia ([place]): acute illness or injury with systemic symptoms that poses a threat to life or bodily functions Anoxic brain injury [facility]): acute illness or injury with systemic symptoms that poses a threat to life or bodily functions    Details: Out of hospital arrest, no bystander cpr, unknown downtime Cardiorespiratory arrest  [facility]): acute illness or injury with systemic symptoms that poses a threat to life or bodily functions History of alcohol use disorder: chronic illness or injury with exacerbation, progression, or side effects of treatment that poses a threat to life or bodily functions History of substance use disorder: chronic illness or injury with exacerbation, progression, or side effects of treatment that poses a threat to life or bodily functions  Amount and/or Complexity of Data Reviewed Independent Historian: EMS    Details: hx External Data Reviewed: notes. Labs: ordered. Decision-making details documented in ED Course. Radiology: ordered and independent interpretation performed. Decision-making details documented in ED Course. ECG/medicine tests: ordered and independent interpretation performed. Decision-making details documented in ED Course. Discussion of management or test interpretation with external provider(s): Cardiology, icu  Risk Prescription drug management. Decision regarding hospitalization.   Iv ns. Continuous pulse ox and cardiac monitoring. Labs ordered/sent. Imaging ordered.   Reviewed nursing notes and prior charts for additional history. External reports reviewed. Additional history from: EMS.   Prior charts note hx etoh and substance use disorder. Narcan 2 mg iv.   Pt with 3 access - one IO line, one EJ and one upper extremity line - ivf wide open. Epi, cpr. CBG normal per EMS, and on recheck in ED 110.  Cardiac monitor: PEA rhythm  Linton Rump d/c'd, CPR continued, multiple rounds of epi.  Cardiology/stemi doc and ICU team consulted. Resp therapy consulted - at bedside. Pharmacy consulted, at bedside.   King airway quickly changed to definitive ETT on first attempt, tube secured at 23 cm, bil bs. Pcxr ordered.   After prolonged CPR and multiple rounds EPI, pt with brief return of pulse, weak/faint. Iv wide open. Pt rhythm quickly deteriorated to vfib, defib, cpr, additional epi  and additional defib. Mg iv, amio bolus/gtt. Ca. Cpr.   Pt with several periods of brief return of pulses post additional meds, epi and  cpr. Ivf, hco3 iv, amio gtt. With cpr, pulse ox improved, but would slowly decrease when cpr held, rhythm too would slowly become more slower, and then became PEA w loss of pulses.   ICU and code stemi doc consulted shortly after pts arrival. Discussed pt with cards, Dr Angelena Form, including EMS ecg and ED ecg, ?possible inferior/lat stemi - he indicates given pts unstable status, v long cpr period, etc, does not feel emergent cath lab would benefit patients - requests admit to icu.  Pt continued to have recurrent loss of pulses w recurrent ROSC post meds, cpr.  With return pulses, additional hco3 given, bedside u/s with ?prom rv ?bowing.  Given u/s, prolonged cpr w recurrent loss of pulses after a few minutes, ecg's, felt trying thrombolytics may be of benefit in event cardiac ischemia or massive PE.   Labs reviewed/interpreted by me -  istat w normal hgb, k not high.   Xrays reviewed/interpreted by me - ett ok. No ptx.   Icu team in ED w pt.   Attempted to speak w family in family waiting/consult room - no one present. Staff to locate family.  CRITICAL CARE  RE: cardioresp arrest with prolonged cpr, multiple brief ROSC with eventually sustained ROSC.  Performed by: Mirna Mires Total critical care time: 80 minutes Critical care time was exclusive of separately billable procedures and treating other patients. Critical care was necessary to treat or prevent imminent or life-threatening deterioration. Critical care was time spent personally by me on the following activities: development of treatment plan with patient and/or surrogate as well as nursing, discussions with consultants, evaluation of patient's response to treatment, examination of patient, obtaining history from patient or surrogate, ordering and performing treatments and interventions, ordering and  review of laboratory studies, ordering and review of radiographic studies, pulse oximetry and re-evaluation of patient's condition.  RN/staff to slowly decrease epi gtt as tolerated. Ivf.  ICU admitting.   Subsequent additional hx from family - icu team indicates parent informed them found pt slumped over, unresponsive, with drug paraphernalia/needle.        Final Clinical Impression(s) / ED Diagnoses Final diagnoses:  None    Rx / DC Orders ED Discharge Orders     None         Lajean Saver, MD 04/03/22 1349

## 2022-04-02 NOTE — Code Documentation (Signed)
Steinl MD doing [place] of heart

## 2022-04-02 NOTE — ED Notes (Signed)
Family escorted to bedside with [facility]

## 2022-04-02 NOTE — ED Notes (Signed)
MD Carlis Abbott and EDP notified - pt having more frequent runs of vtach and throwing PVCs on monitor - pt has strong radial pulses

## 2022-04-02 NOTE — ED Triage Notes (Signed)
Pt BIB [facility] as CPR was found with a needle beside him and his skin was purple. EMS started CPR, did 4 rounds of Epi, had ROSC for 8 minutes & upon arrival to ED had an estimated CPR total time of 30 minutes. He had 3 accesses, IO in Lt tibia, 20g Lt dilt, 18g Rt EJ. C-collar in place.

## 2022-04-02 NOTE — Code Documentation (Signed)
C-collar was removed.

## 2022-04-02 NOTE — ED Notes (Signed)
This RN and RT feel that pt is too unstable for CT - NP okay to bypass CT at this time - PT taken to ICU

## 2022-04-02 NOTE — Progress Notes (Addendum)
[facility] Progress Note Patient Name: Larry Adams DOB: 06/12/1986 MRN: [id number]   Date of Service  Apr 13, 2022  HPI/Events of Note  Multiple issues: 1. Hypotension - Patient is on Epinephrine and Norepinephrinie IV infusions. 2. Mg++ = 3.0.  3. Nursing request for sedation.   eICU Interventions  Plan: Increase ceiling on Norepinephrine IV infusion to 60 mcg/min. Add Vasopressin IV infusion at shock dose.  Wean Epinephrine IV infusion off as tolerated.  NaHCO3 100 meq IV now.  D/C Magnesium Sulphate bolus.  VBG at 12 midnight. BP not stable enough to add sedation.      Intervention Category Major Interventions: Hypotension - evaluation and management  [name] Eugene 04-13-22, 10:00 PM

## 2022-04-02 NOTE — Progress Notes (Addendum)
   03/29/2022 1839  Clinical Encounter Type  Visited With Patient and family together  Visit Type Initial;Code  Referral From Nurse  Consult/Referral To Chaplain    [facility] received call from unit secretary, [place] request support for the patient's family. [place] escorted his parents to consultation room A. [place] provided calming/compassionate presence; provided spiritual support  and emotional support to the patient's mother who was visibly tearful. Escorted parents to [facility]; Advised them [place] remains available for follow up support as needed. This note was prepared by Jeanine Luz, M.Div..  For questions please contact by phone [phone].

## 2022-04-02 NOTE — Progress Notes (Signed)
Patient arrived on unit unresponsive but dyssynchronous with vent, with multiple medications infusing that are not compatible including levo at 7mcg and Epi at 44mcg but due to TNK received patient is not able to receive a central line at this time. Elink MD notified and new orders given.

## 2022-04-02 NOTE — Progress Notes (Signed)
[facility] Progress Note Patient Name: Jaben Benegas DOB: May 29, 1986 MRN: [id number]   Date of Service  04/20/22  HPI/Events of Note  Multiple issues: 1. Troponin = 1903 in setting of post cardiac arrest and CPR. 2. Patient has ventricular ectopy - Already on an Amiodarone IV infusion. CMP and Mg++ levels pending.   eICU Interventions  Plan: Continue to trend Troponin. Await CMP and Mg++ level.      Intervention Category Major Interventions: Arrhythmia - evaluation and management;Electrolyte abnormality - evaluation and management  [place]ia Ferch Eugene 20-Apr-2022, 8:56 PM

## 2022-04-02 NOTE — ED Notes (Signed)
Per MD Carlis Abbott CT has been cancelled

## 2022-04-03 ENCOUNTER — Other Ambulatory Visit (HOSPITAL_COMMUNITY): Payer: Managed Care, Other (non HMO)

## 2022-04-03 ENCOUNTER — Inpatient Hospital Stay (HOSPITAL_COMMUNITY): Payer: Commercial Managed Care - HMO

## 2022-04-03 DIAGNOSIS — I499 Cardiac arrhythmia, unspecified: Secondary | ICD-10-CM | POA: Diagnosis not present

## 2022-04-03 DIAGNOSIS — I469 Cardiac arrest, cause unspecified: Secondary | ICD-10-CM

## 2022-04-03 LAB — CBC
HCT: 44.1 % (ref 39.0–52.0)
HCT: 46 % (ref 39.0–52.0)
Hemoglobin: 14 g/dL (ref 13.0–17.0)
Hemoglobin: 14.7 g/dL (ref 13.0–17.0)
MCH: 32.9 pg (ref 26.0–34.0)
MCH: 33.5 pg (ref 26.0–34.0)
MCHC: 31.7 g/dL (ref 30.0–36.0)
MCHC: 32 g/dL (ref 30.0–36.0)
MCV: 102.9 fL — ABNORMAL HIGH (ref 80.0–100.0)
MCV: 105.5 fL — ABNORMAL HIGH (ref 80.0–100.0)
Platelets: 142 10*3/uL — ABNORMAL LOW (ref 150–400)
Platelets: 95 10*3/uL — ABNORMAL LOW (ref 150–400)
RBC: 4.18 MIL/uL — ABNORMAL LOW (ref 4.22–5.81)
RBC: 4.47 MIL/uL (ref 4.22–5.81)
RDW: 17.4 % — ABNORMAL HIGH (ref 11.5–15.5)
RDW: 18.5 % — ABNORMAL HIGH (ref 11.5–15.5)
WBC: 15.1 10*3/uL — ABNORMAL HIGH (ref 4.0–10.5)
WBC: 19.8 10*3/uL — ABNORMAL HIGH (ref 4.0–10.5)
nRBC: 0.2 % (ref 0.0–0.2)
nRBC: 2.9 % — ABNORMAL HIGH (ref 0.0–0.2)

## 2022-04-03 LAB — GLUCOSE, CAPILLARY
Glucose-Capillary: 118 mg/dL — ABNORMAL HIGH (ref 70–99)
Glucose-Capillary: 63 mg/dL — ABNORMAL LOW (ref 70–99)
Glucose-Capillary: 63 mg/dL — ABNORMAL LOW (ref 70–99)
Glucose-Capillary: 67 mg/dL — ABNORMAL LOW (ref 70–99)
Glucose-Capillary: 67 mg/dL — ABNORMAL LOW (ref 70–99)
Glucose-Capillary: 74 mg/dL (ref 70–99)
Glucose-Capillary: 79 mg/dL (ref 70–99)
Glucose-Capillary: 89 mg/dL (ref 70–99)

## 2022-04-03 LAB — COMPREHENSIVE METABOLIC PANEL
ALT: 180 U/L — ABNORMAL HIGH (ref 0–44)
ALT: 291 U/L — ABNORMAL HIGH (ref 0–44)
AST: 1445 U/L — ABNORMAL HIGH (ref 15–41)
AST: 2395 U/L — ABNORMAL HIGH (ref 15–41)
Albumin: 2.2 g/dL — ABNORMAL LOW (ref 3.5–5.0)
Albumin: 2.2 g/dL — ABNORMAL LOW (ref 3.5–5.0)
Alkaline Phosphatase: 90 U/L (ref 38–126)
Alkaline Phosphatase: 92 U/L (ref 38–126)
Anion gap: 24 — ABNORMAL HIGH (ref 5–15)
Anion gap: 24 — ABNORMAL HIGH (ref 5–15)
BUN: 10 mg/dL (ref 6–20)
BUN: 11 mg/dL (ref 6–20)
CO2: 12 mmol/L — ABNORMAL LOW (ref 22–32)
CO2: 11 mmol/L — ABNORMAL LOW (ref 22–32)
Calcium: 6.7 mg/dL — ABNORMAL LOW (ref 8.9–10.3)
Calcium: 6.7 mg/dL — ABNORMAL LOW (ref 8.9–10.3)
Chloride: 107 mmol/L (ref 98–111)
Chloride: 107 mmol/L (ref 98–111)
Creatinine, Ser: 4.53 mg/dL — ABNORMAL HIGH (ref 0.61–1.24)
Creatinine, Ser: 4.77 mg/dL — ABNORMAL HIGH (ref 0.61–1.24)
GFR, Estimated: 16 mL/min — ABNORMAL LOW (ref >60)
GFR, Estimated: 15 mL/min — ABNORMAL LOW (ref >60)
Glucose, Bld: 74 mg/dL (ref 70–99)
Glucose, Bld: 70 mg/dL (ref 70–99)
Potassium: 3.6 mmol/L (ref 3.5–5.1)
Potassium: 3.1 mmol/L — ABNORMAL LOW (ref 3.5–5.1)
Sodium: 143 mmol/L (ref 135–145)
Sodium: 142 mmol/L (ref 135–145)
Total Bilirubin: 1.2 mg/dL (ref 0.3–1.2)
Total Bilirubin: 1.7 mg/dL — ABNORMAL HIGH (ref 0.3–1.2)
Total Protein: 5.8 g/dL — ABNORMAL LOW (ref 6.5–8.1)
Total Protein: 5.6 g/dL — ABNORMAL LOW (ref 6.5–8.1)

## 2022-04-03 LAB — POCT I-STAT 7, (LYTES, BLD GAS, ICA,H+H)
Acid-base deficit: 16 mmol/L — ABNORMAL HIGH (ref 0.0–2.0)
Acid-base deficit: 15 mmol/L — ABNORMAL HIGH (ref 0.0–2.0)
Acid-base deficit: 12 mmol/L — ABNORMAL HIGH (ref 0.0–2.0)
Bicarbonate: 10.8 mmol/L — ABNORMAL LOW (ref 20.0–28.0)
Bicarbonate: 12.8 mmol/L — ABNORMAL LOW (ref 20.0–28.0)
Bicarbonate: 13.8 mmol/L — ABNORMAL LOW (ref 20.0–28.0)
Calcium, Ion: 0.79 mmol/L — CL (ref 1.15–1.40)
Calcium, Ion: 0.83 mmol/L — CL (ref 1.15–1.40)
Calcium, Ion: 0.83 mmol/L — CL (ref 1.15–1.40)
HCT: 44 % (ref 39.0–52.0)
HCT: 43 % (ref 39.0–52.0)
HCT: 42 % (ref 39.0–52.0)
Hemoglobin: 15 g/dL (ref 13.0–17.0)
Hemoglobin: 14.6 g/dL (ref 13.0–17.0)
Hemoglobin: 14.3 g/dL (ref 13.0–17.0)
O2 Saturation: 92 %
O2 Saturation: 93 %
O2 Saturation: 92 %
Patient temperature: 104.5
Patient temperature: 37.1
Patient temperature: 37
Potassium: 2.8 mmol/L — ABNORMAL LOW (ref 3.5–5.1)
Potassium: 2.9 mmol/L — ABNORMAL LOW (ref 3.5–5.1)
Potassium: 2.8 mmol/L — ABNORMAL LOW (ref 3.5–5.1)
Sodium: 143 mmol/L (ref 135–145)
Sodium: 142 mmol/L (ref 135–145)
Sodium: 141 mmol/L (ref 135–145)
TCO2: 12 mmol/L — ABNORMAL LOW (ref 22–32)
TCO2: 14 mmol/L — ABNORMAL LOW (ref 22–32)
TCO2: 15 mmol/L — ABNORMAL LOW (ref 22–32)
pCO2 arterial: 34.1 mmHg (ref 32–48)
pCO2 arterial: 35 mmHg (ref 32–48)
pCO2 arterial: 30.6 mmHg — ABNORMAL LOW (ref 32–48)
pH, Arterial: 7.127 — CL (ref 7.35–7.45)
pH, Arterial: 7.171 — CL (ref 7.35–7.45)
pH, Arterial: 7.262 — ABNORMAL LOW (ref 7.35–7.45)
pO2, Arterial: 96 mmHg (ref 83–108)
pO2, Arterial: 83 mmHg (ref 83–108)
pO2, Arterial: 71 mmHg — ABNORMAL LOW (ref 83–108)

## 2022-04-03 LAB — BLOOD GAS, VENOUS
Acid-base deficit: 11.4 mmol/L — ABNORMAL HIGH (ref 0.0–2.0)
Bicarbonate: 19.1 mmol/L — ABNORMAL LOW (ref 20.0–28.0)
O2 Saturation: 37.6 %
Patient temperature: 38.9
pCO2, Ven: 68 mmHg — ABNORMAL HIGH (ref 44–60)
pH, Ven: 7.07 — CL (ref 7.25–7.43)
pO2, Ven: 37 mmHg (ref 32–45)

## 2022-04-03 LAB — URINALYSIS, ROUTINE W REFLEX MICROSCOPIC
Bilirubin Urine: NEGATIVE
Glucose, UA: NEGATIVE mg/dL
Ketones, ur: NEGATIVE mg/dL
Leukocytes,Ua: NEGATIVE
Nitrite: NEGATIVE
Protein, ur: 100 mg/dL — AB
Specific Gravity, Urine: 1.015 (ref 1.005–1.030)
pH: 5 (ref 5.0–8.0)

## 2022-04-03 LAB — BASIC METABOLIC PANEL
Anion gap: 19 — ABNORMAL HIGH (ref 5–15)
Anion gap: 20 — ABNORMAL HIGH (ref 5–15)
BUN: 11 mg/dL (ref 6–20)
BUN: 8 mg/dL (ref 6–20)
CO2: 16 mmol/L — ABNORMAL LOW (ref 22–32)
CO2: 22 mmol/L (ref 22–32)
Calcium: 7.1 mg/dL — ABNORMAL LOW (ref 8.9–10.3)
Calcium: 7.9 mg/dL — ABNORMAL LOW (ref 8.9–10.3)
Chloride: 104 mmol/L (ref 98–111)
Chloride: 109 mmol/L (ref 98–111)
Creatinine, Ser: 2.22 mg/dL — ABNORMAL HIGH (ref 0.61–1.24)
Creatinine, Ser: 3.62 mg/dL — ABNORMAL HIGH (ref 0.61–1.24)
GFR, Estimated: 21 mL/min — ABNORMAL LOW (ref >60)
GFR, Estimated: 38 mL/min — ABNORMAL LOW (ref >60)
Glucose, Bld: 128 mg/dL — ABNORMAL HIGH (ref 70–99)
Glucose, Bld: 160 mg/dL — ABNORMAL HIGH (ref 70–99)
Potassium: 3.3 mmol/L — ABNORMAL LOW (ref 3.5–5.1)
Potassium: 3.9 mmol/L (ref 3.5–5.1)
Sodium: 145 mmol/L (ref 135–145)
Sodium: 145 mmol/L (ref 135–145)

## 2022-04-03 LAB — ECHOCARDIOGRAM COMPLETE
Area-P 1/2: 5.32 cm2
Calc EF: 17.4 %
Height: 73 in
S' Lateral: 5.3 cm
Single Plane A2C EF: 16.7 %
Single Plane A4C EF: 17.3 %
Weight: 4888.92 oz

## 2022-04-03 LAB — POCT I-STAT EG7
Acid-base deficit: 13 mmol/L — ABNORMAL HIGH (ref 0.0–2.0)
Bicarbonate: 16.1 mmol/L — ABNORMAL LOW (ref 20.0–28.0)
Calcium, Ion: 0.92 mmol/L — ABNORMAL LOW (ref 1.15–1.40)
HCT: 45 % (ref 39.0–52.0)
Hemoglobin: 15.3 g/dL (ref 13.0–17.0)
O2 Saturation: 32 %
Patient temperature: 97
Potassium: 3.5 mmol/L (ref 3.5–5.1)
Sodium: 141 mmol/L (ref 135–145)
TCO2: 18 mmol/L — ABNORMAL LOW (ref 22–32)
pCO2, Ven: 47.7 mmHg (ref 44–60)
pH, Ven: 7.131 — CL (ref 7.25–7.43)
pO2, Ven: 25 mmHg — CL (ref 32–45)

## 2022-04-03 LAB — MAGNESIUM
Magnesium: 1.5 mg/dL — ABNORMAL LOW (ref 1.7–2.4)
Magnesium: 2.2 mg/dL (ref 1.7–2.4)

## 2022-04-03 LAB — RAPID URINE DRUG SCREEN, HOSP PERFORMED
Amphetamines: NOT DETECTED
Barbiturates: NOT DETECTED
Benzodiazepines: POSITIVE — AB
Cocaine: POSITIVE — AB
Opiates: NOT DETECTED
Tetrahydrocannabinol: NOT DETECTED

## 2022-04-03 LAB — LACTIC ACID, PLASMA
Lactic Acid, Venous: >9 mmol/L (ref 0.5–1.9)
Lactic Acid, Venous: >9 mmol/L (ref 0.5–1.9)

## 2022-04-03 LAB — PHOSPHORUS: Phosphorus: 6 mg/dL — ABNORMAL HIGH (ref 2.5–4.6)

## 2022-04-03 LAB — TROPONIN I (HIGH SENSITIVITY)
Troponin I (High Sensitivity): >24000 ng/L (ref <18)
Troponin I (High Sensitivity): >24000 ng/L (ref <18)

## 2022-04-03 MED ORDER — ORAL CARE MOUTH RINSE: 15.0000 mL | OROMUCOSAL | Status: DC | PRN

## 2022-04-03 MED ORDER — SODIUM CHLORIDE 0.9 % IV SOLN
3.0000 g | Freq: Two times a day (BID) | INTRAVENOUS | Status: DC
  Administered 2022-04-04 (×2): 3 g via INTRAVENOUS
  Filled 2022-04-03 (×2): qty 8

## 2022-04-03 MED ORDER — NOREPINEPHRINE 16 MG/250ML-% IV SOLN
0.0000 ug/min | INTRAVENOUS | Status: DC
  Filled 2022-04-03: qty 250

## 2022-04-03 MED ORDER — SODIUM BICARBONATE 8.4 % IV SOLN
INTRAVENOUS | Status: AC
  Filled 2022-04-03: qty 50

## 2022-04-03 MED ORDER — SODIUM CHLORIDE 0.9 % IV SOLN
0.5000 ug/min | INTRAVENOUS | Status: DC
  Administered 2022-04-03: 5 ug/min via INTRAVENOUS
  Administered 2022-04-03 – 2022-04-04 (×4): 20 ug/min via INTRAVENOUS
  Filled 2022-04-03 (×7): qty 10

## 2022-04-03 MED ORDER — POTASSIUM CHLORIDE 10 MEQ/50ML IV SOLN
10.0000 meq | INTRAVENOUS | Status: AC
  Administered 2022-04-03 (×2): 10 meq via INTRAVENOUS
  Filled 2022-04-03 (×2): qty 50

## 2022-04-03 MED ORDER — NOREPINEPHRINE 4 MG/250ML-% IV SOLN
INTRAVENOUS | Status: AC
  Filled 2022-04-03: qty 250

## 2022-04-03 MED ORDER — DOCUSATE SODIUM 50 MG/5ML PO LIQD: 100.0000 mg | Freq: Two times a day (BID) | ORAL | Status: DC

## 2022-04-03 MED ORDER — CALCIUM GLUCONATE-NACL 2-0.675 GM/100ML-% IV SOLN
2.0000 g | Freq: Once | INTRAVENOUS | Status: AC
  Administered 2022-04-03: 2000 mg via INTRAVENOUS
  Filled 2022-04-03: qty 100

## 2022-04-03 MED ORDER — SODIUM CHLORIDE 0.9 % IV SOLN: 3.0000 g | Freq: Four times a day (QID) | INTRAVENOUS | Status: DC

## 2022-04-03 MED ORDER — SODIUM CHLORIDE 0.9 % IV SOLN
3.0000 g | Freq: Four times a day (QID) | INTRAVENOUS | Status: DC
  Administered 2022-04-03 (×2): 3 g via INTRAVENOUS
  Filled 2022-04-03 (×2): qty 8

## 2022-04-03 MED ORDER — NOREPINEPHRINE 4 MG/250ML-% IV SOLN
0.0000 ug/min | INTRAVENOUS | Status: DC
  Administered 2022-04-03: 60 ug/min via INTRAVENOUS
  Filled 2022-04-03: qty 250

## 2022-04-03 MED ORDER — PHENYLEPHRINE HCL-NACL 20-0.9 MG/250ML-% IV SOLN
0.0000 ug/min | INTRAVENOUS | Status: DC
  Administered 2022-04-03 (×9): 400 ug/min via INTRAVENOUS
  Filled 2022-04-03 (×9): qty 250

## 2022-04-03 MED ORDER — IPRATROPIUM-ALBUTEROL 0.5-2.5 (3) MG/3ML IN SOLN: 3.0000 mL | RESPIRATORY_TRACT | Status: DC | PRN

## 2022-04-03 MED ORDER — FENTANYL CITRATE PF 50 MCG/ML IJ SOSY: 50.0000 ug | PREFILLED_SYRINGE | INTRAMUSCULAR | Status: DC | PRN

## 2022-04-03 MED ORDER — SODIUM BICARBONATE 8.4 % IV SOLN
INTRAVENOUS | Status: AC
  Administered 2022-04-03: 100 meq via INTRAVENOUS
  Filled 2022-04-03: qty 100

## 2022-04-03 MED ORDER — POTASSIUM CHLORIDE 10 MEQ/50ML IV SOLN
10.0000 meq | INTRAVENOUS | Status: AC
  Administered 2022-04-03 – 2022-04-04 (×4): 10 meq via INTRAVENOUS
  Filled 2022-04-03 (×4): qty 50

## 2022-04-03 MED ORDER — SODIUM CHLORIDE 0.9 % IV SOLN: INTRAVENOUS | Status: DC | PRN

## 2022-04-03 MED ORDER — SODIUM BICARBONATE 8.4 % IV SOLN
INTRAVENOUS | Status: AC
  Administered 2022-04-03: 100 meq
  Filled 2022-04-03: qty 50

## 2022-04-03 MED ORDER — DEXTROSE 50 % IV SOLN: 12.5000 g | INTRAVENOUS | Status: AC

## 2022-04-03 MED ORDER — PERFLUTREN LIPID MICROSPHERE
1.0000 mL | INTRAVENOUS | Status: AC | PRN
  Administered 2022-04-03: 2 mL via INTRAVENOUS

## 2022-04-03 MED ORDER — ORAL CARE MOUTH RINSE
15.0000 mL | OROMUCOSAL | Status: DC
  Administered 2022-04-03 – 2022-04-05 (×23): 15 mL via OROMUCOSAL

## 2022-04-03 MED ORDER — SODIUM BICARBONATE 8.4 % IV SOLN
100.0000 meq | Freq: Once | INTRAVENOUS | Status: AC
  Administered 2022-04-03: 100 meq via INTRAVENOUS
  Filled 2022-04-03: qty 50

## 2022-04-03 MED ORDER — NOREPINEPHRINE 16 MG/250ML-% IV SOLN
0.0000 ug/min | INTRAVENOUS | Status: DC
  Administered 2022-04-03 (×3): 80 ug/min via INTRAVENOUS
  Administered 2022-04-03: 40 ug/min via INTRAVENOUS
  Administered 2022-04-03 – 2022-04-05 (×9): 80 ug/min via INTRAVENOUS
  Filled 2022-04-03 (×13): qty 250

## 2022-04-03 MED ORDER — SODIUM BICARBONATE 8.4 % IV SOLN: 100.0000 meq | Freq: Once | INTRAVENOUS | Status: AC

## 2022-04-03 MED ORDER — DEXTROSE 10 % IV SOLN: INTRAVENOUS | Status: DC

## 2022-04-03 MED ORDER — SODIUM CHLORIDE 0.9 % IV SOLN
0.5000 ug/min | INTRAVENOUS | Status: DC
  Filled 2022-04-03: qty 10

## 2022-04-03 MED ORDER — SODIUM BICARBONATE 8.4 % IV SOLN: 100.0000 meq | Freq: Once | INTRAVENOUS | Status: DC

## 2022-04-03 MED ORDER — EPINEPHRINE HCL 5 MG/250ML IV SOLN IN NS
0.5000 ug/min | INTRAVENOUS | Status: DC
  Filled 2022-04-03: qty 250

## 2022-04-03 MED ORDER — DEXTROSE 50 % IV SOLN
1.0000 | Freq: Once | INTRAVENOUS | Status: AC
  Administered 2022-04-03: 50 mL via INTRAVENOUS
  Filled 2022-04-03: qty 50

## 2022-04-03 MED ORDER — POLYETHYLENE GLYCOL 3350 17 G PO PACK: 17.0000 g | PACK | Freq: Every day | ORAL | Status: DC

## 2022-04-03 MED ORDER — DEXTROSE 50 % IV SOLN
INTRAVENOUS | Status: AC
  Filled 2022-04-03: qty 50

## 2022-04-03 MED ORDER — CHLORHEXIDINE GLUCONATE CLOTH 2 % EX PADS
6.0000 | MEDICATED_PAD | Freq: Every day | CUTANEOUS | Status: DC
  Administered 2022-04-02 – 2022-04-04 (×2): 6 via TOPICAL

## 2022-04-03 MED ORDER — PHENYLEPHRINE CONCENTRATED 100MG/250ML (0.4 MG/ML) INFUSION SIMPLE
0.0000 ug/min | INTRAVENOUS | Status: DC
  Administered 2022-04-03 – 2022-04-05 (×10): 400 ug/min via INTRAVENOUS
  Filled 2022-04-03 (×13): qty 250

## 2022-04-03 MED ORDER — DEXTROSE 50 % IV SOLN
12.5000 g | INTRAVENOUS | Status: AC
  Administered 2022-04-03: 12.5 g via INTRAVENOUS

## 2022-04-03 MED ORDER — DOCUSATE SODIUM 50 MG/5ML PO LIQD: 100.0000 mg | Freq: Two times a day (BID) | ORAL | Status: DC | PRN

## 2022-04-03 MED ORDER — DEXTROSE 50 % IV SOLN
12.5000 g | INTRAVENOUS | Status: AC
  Administered 2022-04-03: 12.5 g via INTRAVENOUS
  Filled 2022-04-03: qty 50

## 2022-04-03 MED ORDER — IPRATROPIUM-ALBUTEROL 0.5-2.5 (3) MG/3ML IN SOLN
3.0000 mL | RESPIRATORY_TRACT | Status: DC
  Administered 2022-04-03 (×2): 3 mL via RESPIRATORY_TRACT
  Filled 2022-04-03 (×3): qty 3

## 2022-04-03 MED ORDER — LEVALBUTEROL HCL 0.63 MG/3ML IN NEBU: 0.6300 mg | INHALATION_SOLUTION | RESPIRATORY_TRACT | Status: DC | PRN

## 2022-04-03 MED ORDER — MAGNESIUM SULFATE 2 GM/50ML IV SOLN
2.0000 g | Freq: Once | INTRAVENOUS | Status: AC
  Administered 2022-04-03: 2 g via INTRAVENOUS
  Filled 2022-04-03: qty 50

## 2022-04-03 NOTE — Progress Notes (Signed)
Left IO removed pressure applied until no bleeding noted. Pressure dsg applied.

## 2022-04-03 NOTE — Progress Notes (Signed)
At bedside for worse respiratory acidosis, combined with ongoing metabolic acidosis. Dyssynchronous now with MV- peak pressures still low. Has been off sedation throughout due to mental status. Mild rhonchi and wheezing. Adding duonebs Q4h, fentanyl PRN for vent synchrony. Con't bicarb gtt.   Julian Hy, DO 04/03/22 1:26 AM [facility]

## 2022-04-03 NOTE — Progress Notes (Signed)
NAME:  Larry Adams, MRN:  [id number], DOB:  Sep 18, 1985, LOS: 1 ADMISSION DATE:  04/12/2022, CONSULTATION DATE:  04/03/22 REFERRING MD: EDP  CHIEF COMPLAINT:  Cardiac Arrest    History of Present Illness:  This is a 36 year old male with a past medical history of drug use and alcohol use, presenting to the emergency room after being unresponsive at home.  Patient came in with CPR being performed.  Multiple rounds of CPR performed, with intermittent ROSC.  Patient ultimately did get ROSC, and pulses were not loss.  Patient did go into VT, and amnio drip was started.  Patient was also given TNK for any possible massive PE.  PCCM was consulted for further management and treatment.  Pertinent  Medical History  Anxiety, back pain, depression, drug overdose  Significant Hospital Events: Including procedures, antibiotic start and stop dates in addition to other pertinent events   10/5 admission to ICU, intubated after cardiac arrest  Interim History / Subjective:  Patient is currently intubated and unresponsive.  Patient is not on any sedation.  No family in the room at the time of my evaluation.  Objective   Blood pressure 102/73, pulse (!) 101, temperature (!) 104.5 F (40.3 C), temperature source Esophageal, resp. rate (!) 34, height 6\' 1"  (1.854 m), weight (!) 138.6 kg, SpO2 99 %.    Vent Mode: PRVC FiO2 (%):  [70 %-100 %] 100 % Set Rate:  [24 bmp-38 bmp] 34 bmp Vt Set:  [630 mL] 630 mL PEEP:  [8 cmH20] 8 cmH20 Plateau Pressure:  [23 cmH20] 23 cmH20   Intake/Output Summary (Last 24 hours) at 04/03/2022 1232 Last data filed at 04/03/2022 1100 Gross per 24 hour  Intake 10678.37 ml  Output --  Net 10678.37 ml   Filed Weights   04/08/2022 2200 04/03/22 0104  Weight: (!) 138.6 kg (!) 138.6 kg    Examination:  General: Patient is intubated and unresponsive on my exam Eyes: Blown pupils, equal but not reactive to light Head: Normocephalic, atraumatic  Cardio: Tachycardic rate  normal rhythm.  No murmurs auscultated.  Faint peripheral pulses Pulmonary: Coarse breath sounds throughout Abdomen: Soft. Neuro: Not alert not oriented.  Patient not sedated.  Pupils not reactive to light.  No gag reflex, no corneal reflex.  Patient is not able to withdrawal to pain. Skin: No rashes noted  MSK: No mood  Resolved Hospital Problem list     Assessment & Plan:  This is a 36 year old male with a past medical history of alcohol use and drug use who suffered a cardiac arrest at home.  Patient came in with CPR, and ROSC was obtained.  Patient went into VT tach, and required amnio drip.  Patient admitted to [facility] for further treatment and management of cardiac arrest.  #Cardiac arrest likely secondary to drug overdose #Acute hypoxemic and hypercarbic respiratory failure Patient remains intubated.  Patient is not requiring any sedation, given that the patient is not awake after the incident.  This is likely due to drug overdose.  Patient has not responded to Narcan.  Given concern for airway, patient was intubated.  Cardiology consulted, stating that there is not much they can do at this point, and continue supportive care.  Echo showing 10% ejection fraction.  Central line and A-line placed in patient today. -Continue supportive care with pressors -Continue to monitor -Continue amio drip  #Acute metabolic encephalopathy, likely secondary to anoxic brain injury This is likely secondary to cardiac arrest.  Patient remains unresponsive.  Patient  has been unstable to take to CT scan at this point.  Continue to monitor, and consider CT head once patient is stable.  Patient is also spiking fevers, unclear why, but this could be related to injury to thermoregulation center in brain, but unclear. -Continue to monitor -Patient not requiring any sedation, as patient is unresponsive -CT head scan when patient is stable  #Cardiogenic shock Patient's blood pressures remain in the 80s to 90s  with maps in the low 60s.  Patient is currently maxed out on epinephrine, Levophed, vasopressin, and phenylephrine.  Patient's EF at less than 10%. -Continue pressors for map goal of greater than 65 -Wean as tolerated -Cardiology following, appreciate recs  #AKI Patient's creati nine today is 2.22.  This is up from baseline of 1.  This could be in the setting of poor perfusion from cardiac arrest.  Patient has not had any urine output at this time.  Will place Foley -UA pending -Continue to monitor CMP  #Elevated liver enzymes Patient initially presented with elevation in liver enzymes with AST at 196.  ALT 47.  This could be in the setting of cardiac arrest.  Continue to trend liver enzymes. -Continue to monitor CMP  #Lung infiltrate  Patient presented with possible infiltrates to bilateral lung fields.  This could be new pneumonia -Continue antibiotics with Unasyn  Best Practice (right click and "Reselect all SmartList Selections" daily)   Diet/type: NPO DVT prophylaxis: Heparin GI prophylaxis: PPI Lines: Central line and Arterial Line Foley: Yes Code Status:  DNR Last date of multidisciplinary goals of care discussion [04/03/2022]  Labs   CBC: Recent Labs  Lab 04/22/2022 1937 04/04/2022 2032 04/03/22 0026 04/03/22 0950 04/03/22 1124  WBC 19.4*  --  19.8* 15.1*  --   HGB 14.6 14.6 14.7 14.0 15.3  HCT 47.7 43.0 46.0 44.1 45.0  MCV 110.9*  --  102.9* 105.5*  --   PLT 122*  --  142* 95*  --     Basic Metabolic Panel: Recent Labs  Lab 04/13/2022 1847 04/13/2022 1848 03/31/2022 1937 04/09/2022 2032 04/03/22 0026 04/03/22 0738 04/03/22 1124  NA 148*   < > 144 148* 145 145 141  K 3.4*   < > 3.6 3.1* 3.3* 3.9 3.5  CL 111  --  117*  --  109 104  --   CO2  --   --  11*  --  16* 22  --   GLUCOSE 143*  --  98  --  160* 128*  --   BUN 4*  --  7  --  8 11  --   CREATININE 1.50*  --  1.29*  --  2.22* 3.62*  --   CALCIUM  --   --  8.5*  --  7.9* 7.1*  --   MG  --   --  3.0*  --   2.2  --   --   PHOS  --   --  7.0*  --  6.0*  --   --    < > = values in this interval not displayed.   GFR: Estimated Creatinine Clearance: 41.3 mL/min (A) (by C-G formula based on SCr of 3.62 mg/dL (H)). Recent Labs  Lab 04/08/2022 1937 04/03/22 0026 04/03/22 0950  WBC 19.4* 19.8* 15.1*    Liver Function Tests: Recent Labs  Lab 04/01/2022 1937  AST 196*  ALT 47*  ALKPHOS 137*  BILITOT 1.2  PROT 6.5  ALBUMIN 2.5*   No results for input(s): "  LIPASE", "AMYLASE" in the last 168 hours. No results for input(s): "AMMONIA" in the last 168 hours.  ABG    Component Value Date/Time   PHART 7.047 (LL) 04/04/2022 1848   PCO2ART 73.9 (HH) 04/11/2022 1848   PO2ART 85 04/07/2022 1848   HCO3 16.1 (L) 04/03/2022 1124   TCO2 18 (L) 04/03/2022 1124   ACIDBASEDEF 13.0 (H) 04/03/2022 1124   O2SAT 32 04/03/2022 1124     Coagulation Profile: Recent Labs  Lab 04/11/2022 1949  INR 1.7*    Cardiac Enzymes: No results for input(s): "CKTOTAL", "CKMB", "CKMBINDEX", "TROPONINI" in the last 168 hours.  HbA1C: Hgb A1c MFr Bld  Date/Time Value Ref Range Status  04/24/2022 07:37 PM 5.1 4.8 - 5.6 % Final    Comment:    (NOTE) Pre diabetes:          5.7%-6.4%  Diabetes:              >6.4%  Glycemic control for   <7.0% adults with diabetes   04/18/2020 06:20 AM 5.2 4.8 - 5.6 % Final    Comment:    (NOTE)         Prediabetes: 5.7 - 6.4         Diabetes: >6.4         Glycemic control for adults with diabetes: <7.0     CBG: Recent Labs  Lab 04/13/2022 1805 04/10/2022 2043 04/10/2022 2341 04/03/22 0743  GLUCAP 119* 109* 123* 118*    Review of Systems:   Negative except what stated in HPI  Past Medical History:  He,  has a past medical history of Anxiety, Back pain, Depression, Drug overdose, and Mental disorder.   Surgical History:   Past Surgical History:  Procedure Laterality Date   HERNIA REPAIR       Social History:   reports that he has been smoking cigarettes. He has  been smoking an average of 1 pack per day. He has quit using smokeless tobacco. He reports current alcohol use of about 6.0 standard drinks of alcohol per week. He reports that he does not currently use drugs after having used the following drugs: IV and Fentanyl.   Family History:  His Family history is unknown by patient.   Allergies Allergies  Allergen Reactions   Morphine And Related Hives   Vancomycin Itching and Other (See Comments)    "Red man syndrome"      Home Medications  Prior to Admission medications   Medication Sig Start Date End Date Taking? Authorizing Provider  carvedilol (COREG) 3.125 MG tablet Take 1 tablet (3.125 mg total) by mouth 2 (two) times daily with a meal. 12/06/21 01/05/22  Amin, Jeanella Flattery, MD  chlordiazePOXIDE (LIBRIUM) 25 MG capsule 50mg  PO TID x 1D, then 25-50mg  PO BID X 1D, then 25-50mg  PO QD X 1D 02/07/22   Varney Biles, MD  furosemide (LASIX) 20 MG tablet Take 1 tablet (20 mg total) by mouth daily. 08/31/21   Fredia Sorrow, MD  losartan (COZAAR) 25 MG tablet Take 1 tablet (25 mg total) by mouth daily. 12/06/21 01/05/22  Amin, Jeanella Flattery, MD  spironolactone (ALDACTONE) 25 MG tablet Take 0.5 tablets (12.5 mg total) by mouth daily. 12/06/21 01/05/22  Damita Lack, MD     Critical care time: [place], DO Internal Medicine Resident PGY-1 Pager: [phone]

## 2022-04-03 NOTE — IPAL (Signed)
  Interdisciplinary Goals of Care Family Meeting   Date carried out: 04/03/2022  Location of the meeting: Bedside  Member's involved: Nurse Practitioner, Bedside Registered Nurse, Chaplain, and Family Member or next of kin  Durable Power of Attorney or acting medical decision maker: Mother and Father both at bedside    Discussion: We discussed goals of care for Larry Adams .  Family understands he is at risk of suffering cardiac arrest at any moment despite aggressive life support measures. We have maximized medical management for his condition and he continues to worsen. In the event of cardiac arrest, they have requested we do not perform CPR or defibrillation.   Code status: Full DNR  Disposition: Continue current acute care  Time spent for the meeting: 28 minutes    Georgann Housekeeper, AGACNP-BC [place] for personal pager PCCM on call pager [phone] until 7pm. Please call Elink 7p-7a. [phone]  04/03/2022 4:24 AM

## 2022-04-03 NOTE — Progress Notes (Signed)
Since pt admitted to 5M w/limited access due to [facility] dose, vent settings were adjusted in conjunction w/team to optimize ventilatory rate in face of acidosis. Pt breathing pattern restricted ability to just increase rate at will but at all opportunitiies the rate was increased ultimately to 38 w/limited asynchrony which usual resulted in more frequent Vtach runs and PVCs. ETCO2 was placed in line and received @  36. With drops in BP highest rise noted was 40 but was able to adjust vent rate to maintain that 35-37 ETCO2.

## 2022-04-03 NOTE — Progress Notes (Signed)
[facility] Progress Note Patient Name: Kaelyn Nauta DOB: 1985-08-20 MRN: [id number]   Date of Service  04/03/2022  HPI/Events of Note  Hypotension - BP 79/68 with patient now on maximal doses of Norepinephrine, Epinephrine, Phenylephrine and Vasopressin IV infusions. May need to revisit GOC with the family.   eICU Interventions  Plan: Not sure there is much useful to add at this point. NaHCO3 100 meq IV now. Will update PCCM ground team.      Intervention Category Major Interventions: Hypotension - evaluation and management  Tyan Dy Eugene 04/03/2022, 3:01 AM

## 2022-04-03 NOTE — Care Plan (Signed)
Pt not stable enough to come down to CT

## 2022-04-03 NOTE — Progress Notes (Addendum)
Attending note: I have seen and examined the patient. History, labs and imaging reviewed.  36 Y/O with ETOH and drug use presenting with drug OD, multiple cardiac arrests. Remains critically ill in multiple pressors, amio and bicarb drip. Made DNR today AM. Febrile to 105  Blood pressure (!) 83/56, pulse (!) 120, temperature (!) 105.9 F (41.1 C), temperature source Oral, resp. rate (!) 38, height 6\' 1"  (1.854 m), weight (!) 138.6 kg, SpO2 100 %. Gen:      No acute distress, obese HEENT:  EOMI, sclera anicteric Neck:     No masses; no thyromegaly, ETT Lungs:    Clear to auscultation bilaterally; normal respiratory effort CV:         Regular rate and rhythm; no murmurs Abd:      + bowel sounds; soft, non-tender; no palpable masses, no distension Ext:    No edema; adequate peripheral perfusion Skin:      Warm and dry; no rash Neuro: Pupiuls fixed and dilated, comatose and unresponsive  Labs/Imaging personally reviewed, significant for Potassium 3.3, BUN/creatinine 8/2.22 WBC 19.8, hemoglobin 14.7, platelets 142 Chest x-ray with cardiomegaly, mild edema  Assessment/plan: Cardiac arrest, multiorgan failure likely secondary to drug overdose Start cooling to ensure normothermia Will need central line and A-line access due to high pressors need and he lost IO access Follow labs, lactic acid He received tPA yesterday but suspicion for PE is low. No CTA due to renal failure Bedside echo report noted. Will get formal Echocardiogram  Acute metabolic encephalopathy, concern for anoxic encephalopathy Head CT ordered but he is too sick to leave the ICU Monitor neuro status  Acute resp failure, possible aspiration Sepsis present on admission Vent support Change ceftrixone to unasyn.  Father updated at bedside.  The patient is critically ill with multiple organ systems failure and requires high complexity decision making for assessment and support, frequent evaluation and titration of  therapies, application of advanced monitoring technologies and extensive interpretation of multiple databases.  Critical care time - 35 mins. This represents my time independent of the NPs time taking care of the pt.  Marshell Garfinkel MD [facility] 04/03/2022, 7:40 AM

## 2022-04-03 NOTE — Progress Notes (Signed)
[facility] Progress Note Patient Name: Larry Adams DOB: 1986/01/25 MRN: [id number]   Date of Service  04/03/2022  HPI/Events of Note  Multiple issues: 1. ABG on 100%/PRVC 34/TV 630/P 8 =7.262/30.6/71/13.8. Unable to increase PRVC rate. 2. K+ = 2.8. 3. iCa++ = 0.83.  eICU Interventions  Plan: Continue NaHCO3 IV infusion.  Replace K+ and Ca++.     Intervention Category Major Interventions: Respiratory failure - evaluation and management;Electrolyte abnormality - evaluation and management  Izabela Ow Eugene 04/03/2022, 10:43 PM

## 2022-04-03 NOTE — Progress Notes (Signed)
PHARMACY NOTE:  ANTIMICROBIAL RENAL DOSAGE ADJUSTMENT  Current antimicrobial regimen includes a mismatch between antimicrobial dosage and estimated renal function.  As per policy approved by the Pharmacy & Therapeutics and Medical Executive Committees, the antimicrobial dosage will be adjusted accordingly.  Current antimicrobial dosage:  Unasyn 3gm IV q6h  Indication: CAP  Renal Function:  Estimated Creatinine Clearance: 31.3 mL/min (A) (by C-G formula based on SCr of 4.77 mg/dL (H)).    Antimicrobial dosage has been changed to:  Unasyn 3gm IV q12h  Sherlon Handing, PharmD, BCPS Please see amion for complete clinical pharmacist phone list 04/03/2022 8:34 PM

## 2022-04-03 NOTE — Progress Notes (Signed)
  Echocardiogram 2D Echocardiogram has been performed.  Larry Adams 04/03/2022,

## 2022-04-03 NOTE — Consult Note (Addendum)
Advanced Heart Failure Team Consult Note   Primary Physician: Patient, No Pcp Per PCP-Cardiologist:  Janina Mayo, MD  Reason for Consultation: Cardiac Arrest/Acute HFrEF  HPI:    Larry Adams is seen today for evaluation of cardiac arrest/acute HFrEF at the request of Dr Vaughan Browner.   Mr Devisser 36 year old with a history of ETOH drug abuse, hep C,  depression, and chronic back pain. Marland Kitchen   He has been seen in the ED multiple times over the last year for  ETOH abuse. Admitted in May with AMS with alcohol withdrawal. Echo EF 45%. Cardiology started on GDMT. No showed/canceled cardiology follow ups.   Admitted after cardiac arrest. Had multiple cardiac arrests. Needle was found nearby. In ED had VT with at least 1 hour of resuscitation efforts.  Given thrombolytics due to unknown cause of arrest. Intubated. Placed on norepi , epi, neo, vaso, bicarb, and amio.  Echo Ef severely reduced biventricular failure. LVEF < 10%. HS 1903, Lactic acid 1. Creatinine trending up to 3.82. Febrile 105.9. Maxed out on pressors. No gag. Pupils unresponsive.   Review of Systems: [y] = yes, [ ]  = no Patient is encephalopathic and or intubated. Therefore history has been obtained from chart review.    General: Weight gain [ ] ; Weight loss [ ] ; Anorexia [ ] ; Fatigue [ ] ; Fever [ ] ; Chills [ ] ; Weakness [ ]   Cardiac: Chest pain/pressure [ ] ; Resting SOB [ ] ; Exertional SOB [ ] ; Orthopnea [ ] ; Pedal Edema [ ] ; Palpitations [ ] ; Syncope [ ] ; Presyncope [ ] ; Paroxysmal nocturnal dyspnea[ ]   Pulmonary: Cough [ ] ; Wheezing[ ] ; Hemoptysis[ ] ; Sputum [ ] ; Snoring [ ]   GI: Vomiting[ ] ; Dysphagia[ ] ; Melena[ ] ; Hematochezia [ ] ; Heartburn[ ] ; Abdominal pain [ ] ; Constipation [ ] ; Diarrhea [ ] ; BRBPR [ ]   GU: Hematuria[ ] ; Dysuria [ ] ; Nocturia[ ]   Vascular: Pain in legs with walking [ ] ; Pain in feet with lying flat [ ] ; Non-healing sores [ ] ; Stroke [ ] ; TIA [ ] ; Slurred speech [ ] ;  Neuro: Headaches[ ] ; Vertigo[ ] ;  Seizures[ ] ; Paresthesias[ ] ;Blurred vision [ ] ; Diplopia [ ] ; Vision changes [ ]   Ortho/Skin: Arthritis [ ] ; Joint pain [ ] ; Muscle pain [ ] ; Joint swelling [ ] ; Back Pain [ ] ; Rash [ ]   Psych: Depression[ ] ; Anxiety[ ]   Heme: Bleeding problems [ ] ; Clotting disorders [ ] ; Anemia [ ]   Endocrine: Diabetes [ ] ; Thyroid dysfunction[ ]   Home Medications Prior to Admission medications   Medication Sig Start Date End Date Taking? Authorizing Provider  CALCIUM PO Take 1 tablet by mouth daily.   Yes [provider]  Multiple Vitamin (MULTIVITAMIN) tablet Take 1 tablet by mouth daily.   Yes [provider]  carvedilol (COREG) 3.125 MG tablet Take 1 tablet (3.125 mg total) by mouth 2 (two) times daily with a meal. Patient not taking: Reported on 04/03/2022 12/06/21 01/05/22  Damita Lack, MD  chlordiazePOXIDE (LIBRIUM) 25 MG capsule 50mg  PO TID x 1D, then 25-50mg  PO BID X 1D, then 25-50mg  PO QD X 1D Patient not taking: Reported on 04/03/2022 02/07/22   Varney Biles, MD  furosemide (LASIX) 20 MG tablet Take 1 tablet (20 mg total) by mouth daily. Patient not taking: Reported on 04/03/2022 08/31/21   Fredia Sorrow, MD  losartan (COZAAR) 25 MG tablet Take 1 tablet (25 mg total) by mouth daily. Patient not taking: Reported on 04/03/2022 12/06/21 01/05/22  Reesa Chew,  Ankit Chirag, MD  spironolactone (ALDACTONE) 25 MG tablet Take 0.5 tablets (12.5 mg total) by mouth daily. Patient not taking: Reported on 04/03/2022 12/06/21 01/05/22  Damita Lack, MD    Past Medical History: Past Medical History:  Diagnosis Date   Anxiety    Back pain    Depression    Drug overdose    Mental disorder     Past Surgical History: Past Surgical History:  Procedure Laterality Date   HERNIA REPAIR      Family History: Family History  Family history unknown: Yes    Social History: Social History   Socioeconomic History   Marital status: Single    Spouse name: Not on file   Number of  children: Not on file   Years of education: Not on file   Highest education level: Not on file  Occupational History   Not on file  Tobacco Use   Smoking status: Every Day    Packs/day: 1.00    Types: Cigarettes   Smokeless tobacco: Former  Scientific laboratory technician Use: Former  Substance and Sexual Activity   Alcohol use: Yes    Alcohol/week: 6.0 standard drinks of alcohol    Types: 6 Cans of beer per week    Comment: PTA 25 oz of beer; drinking all day everyday for 2 months   Drug use: Not Currently    Types: IV, Fentanyl    Comment: Overdose on Heroin; IV Fentanyl use   Sexual activity: Yes  Other Topics Concern   Not on file  Social History Narrative   Not on file   Social Determinants of Health   Financial Resource Strain: Not on file  Food Insecurity: Not on file  Transportation Needs: Not on file  Physical Activity: Not on file  Stress: Not on file  Social Connections: Not on file    Allergies:  Allergies  Allergen Reactions   Morphine And Related Hives   Vancomycin Itching and Other (See Comments)    "Red man syndrome"     Objective:    Vital Signs:   Temp:  [98.1 F (36.7 C)-105.9 F (41.1 C)] 104.5 F (40.3 C) (10/06 0927) Pulse Rate:  [58-139] 101 (10/06 1115) Resp:  [17-40] 34 (10/06 1115) BP: (44-130)/(23-96) 102/73 (10/06 1100) SpO2:  [95 %-100 %] 99 % (10/06 1115) FiO2 (%):  [70 %-100 %] 100 % (10/06 0815) Weight:  [138.6 kg] 138.6 kg (10/06 0104) Last BM Date : 04/03/22  Weight change: Filed Weights   04/27/2022 2200 04/03/22 0104  Weight: (!) 138.6 kg (!) 138.6 kg    Intake/Output:   Intake/Output Summary (Last 24 hours) at 04/03/2022 1210 Last data filed at 04/03/2022 1100 Gross per 24 hour  Intake 10678.37 ml  Output --  Net 10678.37 ml      Physical Exam    General:  Intubated  HEENT: ETT. Pupils unresponsive.   Neck: supple. JVP . Carotids 2+ bilat; no bruits. No lymphadenopathy or thyromegaly appreciated. Cor: PMI  nondisplaced. Regular rate & rhythm. No rubs, gallops or murmurs. Lungs: clear Abdomen: soft, nontender, nondistended. No hepatosplenomegaly. No bruits or masses. Good bowel sounds. Extremities: no cyanosis, clubbing, rash, edema Neuro: Intubated. No gag.   Telemetry   SR with runs of VT.   EKG      Labs   Basic Metabolic Panel: Recent Labs  Lab 04/16/2022 1847 04/01/2022 1848 04/14/2022 1937 03/31/2022 2032 04/03/22 0026 04/03/22 0738 04/03/22 1124  NA 148*   < >  144 148* 145 145 141  K 3.4*   < > 3.6 3.1* 3.3* 3.9 3.5  CL 111  --  117*  --  109 104  --   CO2  --   --  11*  --  16* 22  --   GLUCOSE 143*  --  98  --  160* 128*  --   BUN 4*  --  7  --  8 11  --   CREATININE 1.50*  --  1.29*  --  2.22* 3.62*  --   CALCIUM  --   --  8.5*  --  7.9* 7.1*  --   MG  --   --  3.0*  --  2.2  --   --   PHOS  --   --  7.0*  --  6.0*  --   --    < > = values in this interval not displayed.    Liver Function Tests: Recent Labs  Lab 04/14/2022 1937  AST 196*  ALT 47*  ALKPHOS 137*  BILITOT 1.2  PROT 6.5  ALBUMIN 2.5*   No results for input(s): "LIPASE", "AMYLASE" in the last 168 hours. No results for input(s): "AMMONIA" in the last 168 hours.  CBC: Recent Labs  Lab 04/27/2022 1937 04/26/2022 2032 04/03/22 0026 04/03/22 0950 04/03/22 1124  WBC 19.4*  --  19.8* 15.1*  --   HGB 14.6 14.6 14.7 14.0 15.3  HCT 47.7 43.0 46.0 44.1 45.0  MCV 110.9*  --  102.9* 105.5*  --   PLT 122*  --  142* 95*  --     Cardiac Enzymes: No results for input(s): "CKTOTAL", "CKMB", "CKMBINDEX", "TROPONINI" in the last 168 hours.  BNP: BNP (last 3 results) Recent Labs    08/31/21 2012 09/16/21 1803 09/29/21 2041  BNP 11.8 10.8 6.9    ProBNP (last 3 results) No results for input(s): "PROBNP" in the last 8760 hours.   CBG: Recent Labs  Lab 04/27/2022 1805 04/13/2022 2043 04/25/2022 2341 04/03/22 0743  GLUCAP 119* 109* 123* 118*    Coagulation Studies: Recent Labs    04/26/2022 1949   LABPROT 20.2*  INR 1.7*     Imaging   DG Chest Port 1 View  Result Date: 04/03/2022 CLINICAL DATA:  Central line placement EXAM: PORTABLE CHEST 1 VIEW COMPARISON:  04/17/2022 FINDINGS: Interval placement of right IJ approach central venous catheter with distal tip terminating at the superior cavoatrial junction. Endotracheal tube terminates approximately 2.5 cm above the carina. Enteric tube courses below the diaphragm with distal tip beyond the inferior margin of the film. Cardiomegaly. Similar diffuse interstitial prominence. No large pleural fluid collection. No pneumothorax. IMPRESSION: Interval placement of right IJ approach central venous catheter with distal tip terminating at the superior cavoatrial junction. No pneumothorax. Electronically Signed   By: Davina Poke D.O.   On: 04/03/2022 10:33   ECHOCARDIOGRAM COMPLETE  Result Date: 04/03/2022    ECHOCARDIOGRAM REPORT   Patient Name:   Terez Freimark Date of Exam: 04/03/2022 Medical Rec #:  [id number]       Height:       73.0 in Accession #:    [id number]      Weight:       305.6 lb Date of Birth:  06-05-1986      BSA:          2.576 m Patient Age:    78 years        BP:  75/28 mmHg Patient Gender: M               HR:           118 bpm. Exam Location:  Inpatient Procedure: 2D Echo, Cardiac Doppler, Color Doppler and Intracardiac            Opacification Agent STAT ECHO REPORT CONTAINS CRITICAL RESULT Indications:    I49.9* Cardiac arrhythmia, unspecified. Cardiac arrest.  History:        Patient has prior history of Echocardiogram examinations, most                 recent 11/29/2021. CHF, Abnormal ECG, Arrythmias:Cardiac Arrest;                 Risk Factors:Current Smoker. ETOH. Substance abuse.  Sonographer:    Roseanna Rainbow RDCS Referring Phys: Idamae Schuller  Sonographer Comments: Technically difficult study due to poor echo windows, patient is obese, echo performed with patient supine and on artificial respirator, suboptimal subcostal  window, suboptimal apical window and suboptimal parasternal window. Image acquisition challenging due to patient body habitus. IMPRESSIONS  1. There is severe biventricular failure.  2. Left ventricular ejection fraction, by estimation, is 10-15%. The left ventricle has severely decreased function. The left ventricle demonstrates global hypokinesis. Left ventricular diastolic parameters are consistent with Grade II diastolic dysfunction (pseudonormalization). No definitive LV thrombus visualized, but there is significant swirling of definity contrast noted in the LV apex consistent with low flow state.  3. Right ventricular systolic function is severely reduced. The right ventricular size is normal.  4. The mitral valve is grossly normal. Trivial mitral valve regurgitation.  5. The aortic valve was not well visualized. Aortic valve regurgitation is not visualized. No aortic stenosis is present. FINDINGS  Left Ventricle: Left ventricular ejection fraction, by estimation, is 10-15%. The left ventricle has severely decreased function. The left ventricle demonstrates global hypokinesis. Definity contrast agent was given IV to delineate the left ventricular endocardial borders. The left ventricular internal cavity size was normal in size. There is no left ventricular hypertrophy. Left ventricular diastolic parameters are consistent with Grade II diastolic dysfunction (pseudonormalization). Right Ventricle: The right ventricular size is normal. No increase in right ventricular wall thickness. Right ventricular systolic function is severely reduced. Left Atrium: Left atrial size was normal in size. Right Atrium: Right atrial size was normal in size. Pericardium: There is no evidence of pericardial effusion. Mitral Valve: The mitral valve is grossly normal. Trivial mitral valve regurgitation. Tricuspid Valve: The tricuspid valve is normal in structure. Tricuspid valve regurgitation is trivial. Aortic Valve: The aortic valve  was not well visualized. Aortic valve regurgitation is not visualized. No aortic stenosis is present. Pulmonic Valve: The pulmonic valve was not well visualized. Pulmonic valve regurgitation is trivial. Aorta: The aortic root is normal in size and structure. IAS/Shunts: The atrial septum is grossly normal.  LEFT VENTRICLE PLAX 2D LVIDd:         5.50 cm      Diastology LVIDs:         5.30 cm      LV e' medial:    5.00 cm/s LV PW:         1.10 cm      LV E/e' medial:  9.7 LV IVS:        1.00 cm      LV e' lateral:   5.87 cm/s LVOT diam:     2.10 cm      LV  E/e' lateral: 8.2 LV SV:         21 LV SV Index:   8 LVOT Area:     3.46 cm  LV Volumes (MOD) LV vol d, MOD A2C: 198.0 ml LV vol d, MOD A4C: 168.0 ml LV vol s, MOD A2C: 165.0 ml LV vol s, MOD A4C: 139.0 ml LV SV MOD A2C:     33.0 ml LV SV MOD A4C:     168.0 ml LV SV MOD BP:      32.2 ml RIGHT VENTRICLE RV S prime:     4.26 cm/s TAPSE (M-mode): 0.6 cm LEFT ATRIUM             Index        RIGHT ATRIUM           Index LA diam:        3.20 cm 1.24 cm/m   RA Area:     12.60 cm LA Vol (A2C):   41.3 ml 16.03 ml/m  RA Volume:   28.80 ml  11.18 ml/m LA Vol (A4C):   24.9 ml 9.67 ml/m LA Biplane Vol: 33.0 ml 12.81 ml/m  AORTIC VALVE LVOT Vmax:   53.20 cm/s LVOT Vmean:  33.800 cm/s LVOT VTI:    0.059 m  AORTA Ao Root diam: 2.90 cm Ao Asc diam:  2.90 cm MITRAL VALVE               TRICUSPID VALVE MV Area (PHT): 5.32 cm    TR Peak grad:   11.6 mmHg MV Decel Time: 143 msec    TR Vmax:        170.00 cm/s MV E velocity: 48.37 cm/s MV A velocity: 33.73 cm/s  SHUNTS MV E/A ratio:  1.43        Systemic VTI:  0.06 m                            Systemic Diam: 2.10 cm Gwyndolyn Kaufman MD Electronically signed by Gwyndolyn Kaufman MD Signature Date/Time: 04/03/2022/10:01:47 AM    Final    DG Abd 1 View  Result Date: 04/14/2022 CLINICAL DATA:  Orogastric tube placement EXAM: ABDOMEN - 1 VIEW COMPARISON:  None Available. FINDINGS: Orogastric tube tip is seen within the proximal  body of the stomach. The visualized abdominal gas pattern is nonobstructive. Pelvis excluded from view. No gross free intraperitoneal gas. IMPRESSION: Orogastric tube tip within the proximal body of the stomach. Electronically Signed   By: Fidela Salisbury M.D.   On: 04/14/2022 19:16   DG Chest Port 1 View  Result Date: 04/12/2022 CLINICAL DATA:  Arrest. EXAM: PORTABLE CHEST 1 VIEW COMPARISON:  Chest x-ray 12/02/2021 FINDINGS: Endotracheal tube tip is 3.8 cm above the carina. Enteric tube extends below the diaphragm. Multiple lines and tubes overlie the chest. The heart is enlarged. There is central pulmonary vascular congestion. Lung volumes are low. No focal lung consolidation, pleural effusion or pneumothorax. No acute fractures are seen. IMPRESSION: 1. Endotracheal tube tip is 3.8 cm above the carina. 2. Cardiomegaly with central pulmonary vascular congestion. Electronically Signed   By: Ronney Asters M.D.   On: 04/25/2022 19:15     Medications:     Current Medications:  Chlorhexidine Gluconate Cloth  6 each Topical Daily   docusate  100 mg Per Tube BID   folic acid  1 mg Per Tube Daily   heparin  5,000 Units Subcutaneous Q8H   insulin  aspart  0-20 Units Subcutaneous Q4H   ipratropium-albuterol  3 mL Nebulization Q4H   multivitamin  15 mL Per Tube Daily   mouth rinse  15 mL Mouth Rinse Q2H   pantoprazole  40 mg Per Tube Daily   polyethylene glycol  17 g Per Tube Daily   thiamine  100 mg Per Tube Daily    Infusions:  amiodarone 60 mg/hr (04/03/22 1109)   ampicillin-sulbactam (UNASYN) IV     epinephrine 5 mcg/min (04/03/22 1100)   lactated ringers     norepinephrine (LEVOPHED) Adult infusion 40 mcg/min (04/03/22 1100)   norepinephrine     phenylephrine (NEO-SYNEPHRINE) Adult infusion 400 mcg/min (04/03/22 1100)   sodium bicarbonate 150 mEq in dextrose 5 % 1,150 mL infusion 200 mL/hr at 04/03/22 1103   vasopressin 0.03 Units/min (04/03/22 1100)      Patient Profile   Mr Walloch  36 year old with a history of ETOH and drug abuse. Admitted witnessed arrest. Had multiple cardiac arrests. Needle was found nearby.   In ED had VT with at least 1 hour of resuscitation efforts. Now with MSOF. DNR   Assessment/Plan   1. Cardiac Arrest-->MSOF 2nd to drug overdose -Multiple arrest, ~ 1hours.  -Febrile T > 105. Unresponsive. Unable to obtain CT given instability. - On Max pressors and now DNR. Echo with severe biventricular HF. Ef < 10%.  - Nothing to offer from cardiology standpoint.   2. Acute Hypoxic Respiratory Failure possible aspiration.  Intubated on admit. On antibiotics.   3. VT On amio drip.   4. AKI  Worsening renal function with no urine output.   5 ETOH abuse  6.  DNR   Length of Stay: 1  Amy Clegg, NP  04/03/2022, 12:10 PM  Advanced Heart Failure Team Pager [phone] (M-F; 7a - 5p)  Please contact [facility] for night-coverage after hours (4p -7a ) and weekends on [email or web address]  Patient seen with NP, agree with the above note.   Family heard patient fall and found him down at home. He had 30 minutes of CPR initially with PEA arrest when EMS arrived, then 30 minutes more ACLS on and off in the ER.  He was shocked once for VF.  He was given thrombolytics for salvage.  He eventually regained a perfusing rhythm.  He was found with a nearby needle, suspect opioid overdose.  Has history of drug abuse.    Currently, he is on amiodarone 60 mg/hr, NE 80, epinephrine 20, HCO3 gtt 200 cc/hr, phenylephrine 400.    He has no pupillary response or gag reflex.  Temperature is about 105.  He is on Unasyn for aspiration.    SBP 90s currently.  Co-ox 32%, lactate > 9.   Echo reviewed, LV EF 10% with severe RV dysfunction.   General: Intubated Neck: No JVD, no thyromegaly or thyroid nodule.  Lungs: rhonchi noted CV: Nondisplaced PMI.  Heart regular S1/S2, no S3/S4, no murmur.  1+ ankle edema.  No carotid bruit.  Normal pedal pulses.  Abdomen: Soft,  nontender, no hepatosplenomegaly, no distention.  Skin: Intact without lesions or rashes.  Neurologic: No pupillary or gag reflexes.  Extremities: No clubbing or cyanosis.  HEENT: Normal.   S/p arrest => PEA to VF, received thrombolytics.  Likely respiratory arrest initially due to opioid overdose.  No pupillary or gag reflex currently, deranged thermoregulation noted as well.  - Cooling for normothermia.  - Suspect severe anoxic encephalopathy, if not made comfort care would get CT head  when stable to travel.   Cardiogenic shock: Post-arrest, baseline echo in 6/23 showed EF 45-50%, now with EF 10% and severe RV dysfunction. He is on multiple pressors and HCO3 gtt as above to maintain MAP.   - Do not think there would be utility to adding dobutamine and would make him more arrhythmogenic.  - Would avoid mechanical support/ECMO with prolonged CPR, evidence for anoxic encephalopathy, lactate > 9.   VT: Patient continues to have runs of VT, continue amiodarone gtt at 60 mg/hr.   AKI: Post-arrest, creatinine up to 3.62.  Suspect ATN.   I think that there is very little hope for meaningful recovery here.  He is DNR/DNI, would discuss comfort care option with family.   Loralie Champagne 04/03/2022 1:35 PM

## 2022-04-03 NOTE — Progress Notes (Signed)
   04/03/22 0344  Clinical Encounter Type  Visited With Patient and family together  Visit Type Follow-up;Spiritual support;Psychological support  Referral From Nurse  Consult/Referral To Chaplain   [facility] received a call from the attending nurse requesting  support for the patient's mother. The mother was visibly crying at the bedside. CH provided emotional support. Shared inspirational words to establish rapport and connectedness. Offered silent prayer at the bedside. The mother responded by sharing feelings of anxiety and said "he over did it this time." The more she talked about her son's challenges, seemingly her anxiety lessen.  The patient's father arrived,CH introduced our services , he stated he had no needs at this time. Advised CH remains available for follow support as needed.  This note was prepared by Jeanine Luz, M.Div..  For questions please contact by phone [phone].

## 2022-04-03 NOTE — Procedures (Signed)
Arterial Catheter Insertion Procedure Note  Larry Adams  [id number]  [birth year]  Date:04/03/22  Time:12:20 PM    Provider Performing: Marshell Garfinkel    Procedure: Insertion of Arterial Line [phone]) with US guidance ([id number])   Indication(s) Blood pressure monitoring and/or need for frequent ABGs  Consent Risks of the procedure as well as the alternatives and risks of each were explained to the patient and/or caregiver.  Consent for the procedure was obtained and is signed in the bedside chart  Anesthesia None   Time Out Verified patient identification, verified procedure, site/side was marked, verified correct patient position, special equipment/implants available, medications/allergies/relevant history reviewed, required imaging and test results available.   Sterile Technique Maximal sterile technique including full sterile barrier drape, hand hygiene, sterile gown, sterile gloves, mask, hair covering, sterile ultrasound probe cover (if used).   Procedure Description Area of catheter insertion was cleaned with chlorhexidine and draped in sterile fashion. With real-time ultrasound guidance an arterial catheter was placed into the left  axillary  artery.  Appropriate arterial tracings confirmed on monitor.     Complications/Tolerance None; patient tolerated the procedure well.   EBL Minimal   Specimen(s) None  Marshell Garfinkel MD [facility] See Amion for pager  If no response to pager , please call [phone] until 7pm After 7:00 pm call Elink  [phone] 04/03/2022, 12:21 PM Arterial Catheter Insertion Procedure Note

## 2022-04-03 NOTE — Progress Notes (Signed)
Initial Nutrition Assessment  DOCUMENTATION CODES:   Not applicable  INTERVENTION:   When clinical status allows, recommend initiation of enteral nutrition via OG tube. Recommend: - Start Vital 1.5 @ 20 ml/hr and advance by 10 ml q 8 hours to goal rate of 60 ml/hr (1440 ml/day) - PROSource TF20 60 ml BID  Recommended tube feeding regimen at goal rate would provide 2320 kcal, 137 grams of protein, and 1100 ml of H2O.   NUTRITION DIAGNOSIS:   Inadequate oral intake related to inability to eat as evidenced by NPO status.  GOAL:   Patient will meet greater than or equal to 90% of their needs  MONITOR:   Vent status, Labs, Weight trends, I & O's  REASON FOR ASSESSMENT:   Ventilator, Consult Enteral/tube feeding initiation and management  ASSESSMENT:   36 year old male who presented to the ED on 10/05 after collapsing at home. PMH of drug and EtOH abuse, hepatitis C, anxiety, depression. Pt admitted with multiple cardiac arrests 2/2 drug overdose, cardiogenic shock, AKI. Pt given TNK for any possible massive PE.  Discussed pt with RN and during ICU rounds. Consult placed overnight for enteral nutrition initiation and management. Discussed with CCM MD. Pt currently on 4 pressors. Given pressor requirements, plan is to hold off on starting tube feeds today. RD to leave tube feeding recommendations. Pt with OG tube in stomach per abdominal x-ray on 10/5.  Unable to obtain diet and weight history at this time. Reviewed weight history in chart. Weight trending up over last 6-7 months.  Patient is currently intubated on ventilator support MV: 20.1 L/min Temp (24hrs), Avg:101.9 F (38.8 C), Min:98.1 F (36.7 C), Max:105.9 F (41.1 C) BP (a-line): 85/47 MAP (a-line): 58  Drips: Amiodarone Epinephrine Levophed Phenylephrine Vasopressin Sodium bicarb: 200 ml/hr  Medications reviewed and include: colace, folic acid, SSI q 4 hours, liquid MVI, protonix, miralax, thiamine, IV  abx  Labs reviewed: potassium 2.8, creatinine 3.62, ionized calcium 0.79, phosphorus 6.0, elevated LFTs, lactic acid >9.0, WBC 15.1, platelets 95 CBG's: 109-123 x 24 hours  I/O's: +10.6 L since admit  NUTRITION - FOCUSED PHYSICAL EXAM:  Unable to complete at this time. Pt undergoing sterile procedure.  Diet Order:   Diet Order             Diet NPO time specified  Diet effective now                   EDUCATION NEEDS:   No education needs have been identified at this time  Skin:  Skin Assessment: Reviewed RN Assessment  Last BM:  04/03/22  Height:   Ht Readings from Last 1 Encounters:  04-19-2022 6\' 1"  (1.854 m)    Weight:   Wt Readings from Last 1 Encounters:  04/03/22 (!) 138.6 kg    Ideal Body Weight:  83.6 kg  BMI:  Body mass index is 40.31 kg/m.  Estimated Nutritional Needs:   Kcal:  2300-2500  Protein:  120-140 grams  Fluid:  >2.0 L    Gustavus Bryant, MS, RD, LDN Inpatient Clinical Dietitian Please see AMiON for contact information.

## 2022-04-03 NOTE — Procedures (Addendum)
Central Venous Catheter Insertion Procedure Note  Larry Adams  [id number]  10/29/1985  Date:04/03/22  Time:10:44 AM   Provider Performing:Zeynep Fantroy Posey Pronto   Procedure: Insertion of Non-tunneled Central Venous [phone]) with US guidance ([id number])   Indication(s) Medication administration  Consent Risks of the procedure as well as the alternatives and risks of each were explained to the patient and/or caregiver.  Consent for the procedure was obtained and is signed in the bedside chart  Anesthesia Topical only with 1% lidocaine   Timeout Verified patient identification, verified procedure, site/side was marked, verified correct patient position, special equipment/implants available, medications/allergies/relevant history reviewed, required imaging and test results available.  Sterile Technique Maximal sterile technique including full sterile barrier drape, hand hygiene, sterile gown, sterile gloves, mask, hair covering, sterile ultrasound probe cover (if used).  Procedure Description Area of catheter insertion was cleaned with chlorhexidine and draped in sterile fashion.  With real-time ultrasound guidance a central venous catheter was placed into the right internal jugular vein. Nonpulsatile blood flow and easy flushing noted in all ports.  The catheter was sutured in place and sterile dressing applied.  Complications/Tolerance None; patient tolerated the procedure well. Chest X-ray is ordered to verify placement for internal jugular or subclavian cannulation.   Chest x-ray is not ordered for femoral cannulation.  EBL Minimal  Placement:    Specimen(s) None  Leigh Aurora, DO Internal Medicine Resident, PGY-1

## 2022-04-03 NOTE — Progress Notes (Signed)
Patient with continuously low BP's not responding to vasopressors MD notified, additional vasopressor added and bicarb pushes ordered. Ground team and family at bedside. Patients code status subsequently changed to DNR.

## 2022-04-03 NOTE — Progress Notes (Signed)
[facility] Progress Note Patient Name: Larry Adams DOB: August 11, 1985 MRN: [id number]   Date of Service  04/03/2022  HPI/Events of Note  Hypoglycemia - Blood glucose = 67.  eICU Interventions  Plan: D10W IV infusion at 40 mL/hour.      Intervention Category Major Interventions: Other:  Lysle Dingwall 04/03/2022, 8:43 PM

## 2022-04-04 DIAGNOSIS — I469 Cardiac arrest, cause unspecified: Secondary | ICD-10-CM | POA: Diagnosis not present

## 2022-04-04 LAB — COMPREHENSIVE METABOLIC PANEL
ALT: 814 U/L — ABNORMAL HIGH (ref 0–44)
AST: 6385 U/L — ABNORMAL HIGH (ref 15–41)
Albumin: 1.8 g/dL — ABNORMAL LOW (ref 3.5–5.0)
Alkaline Phosphatase: 78 U/L (ref 38–126)
Anion gap: 25 — ABNORMAL HIGH (ref 5–15)
BUN: 12 mg/dL (ref 6–20)
CO2: 15 mmol/L — ABNORMAL LOW (ref 22–32)
Calcium: 7.1 mg/dL — ABNORMAL LOW (ref 8.9–10.3)
Chloride: 101 mmol/L (ref 98–111)
Creatinine, Ser: 5.71 mg/dL — ABNORMAL HIGH (ref 0.61–1.24)
GFR, Estimated: 12 mL/min — ABNORMAL LOW (ref >60)
Glucose, Bld: 90 mg/dL (ref 70–99)
Potassium: 3.2 mmol/L — ABNORMAL LOW (ref 3.5–5.1)
Sodium: 141 mmol/L (ref 135–145)
Total Bilirubin: 2.7 mg/dL — ABNORMAL HIGH (ref 0.3–1.2)
Total Protein: 4.9 g/dL — ABNORMAL LOW (ref 6.5–8.1)

## 2022-04-04 LAB — CBC WITH DIFFERENTIAL/PLATELET
Abs Immature Granulocytes: 0.12 10*3/uL — ABNORMAL HIGH (ref 0.00–0.07)
Basophils Absolute: 0 10*3/uL (ref 0.0–0.1)
Basophils Relative: 0 %
Eosinophils Absolute: 0 10*3/uL (ref 0.0–0.5)
Eosinophils Relative: 0 %
HCT: 41.9 % (ref 39.0–52.0)
Hemoglobin: 13.6 g/dL (ref 13.0–17.0)
Immature Granulocytes: 1 %
Lymphocytes Relative: 20 %
Lymphs Abs: 2.7 10*3/uL (ref 0.7–4.0)
MCH: 33.7 pg (ref 26.0–34.0)
MCHC: 32.5 g/dL (ref 30.0–36.0)
MCV: 103.7 fL — ABNORMAL HIGH (ref 80.0–100.0)
Monocytes Absolute: 1 10*3/uL (ref 0.1–1.0)
Monocytes Relative: 8 %
Neutro Abs: 9.4 10*3/uL — ABNORMAL HIGH (ref 1.7–7.7)
Neutrophils Relative %: 71 %
Platelets: 61 10*3/uL — ABNORMAL LOW (ref 150–400)
RBC: 4.04 MIL/uL — ABNORMAL LOW (ref 4.22–5.81)
RDW: 18.3 % — ABNORMAL HIGH (ref 11.5–15.5)
Smear Review: NORMAL
WBC: 13.3 10*3/uL — ABNORMAL HIGH (ref 4.0–10.5)
nRBC: 1.4 % — ABNORMAL HIGH (ref 0.0–0.2)

## 2022-04-04 LAB — POCT I-STAT 7, (LYTES, BLD GAS, ICA,H+H)
Acid-base deficit: 10 mmol/L — ABNORMAL HIGH (ref 0.0–2.0)
Acid-base deficit: 8 mmol/L — ABNORMAL HIGH (ref 0.0–2.0)
Bicarbonate: 16.1 mmol/L — ABNORMAL LOW (ref 20.0–28.0)
Bicarbonate: 17.3 mmol/L — ABNORMAL LOW (ref 20.0–28.0)
Calcium, Ion: 0.81 mmol/L — CL (ref 1.15–1.40)
Calcium, Ion: 0.83 mmol/L — CL (ref 1.15–1.40)
HCT: 41 % (ref 39.0–52.0)
HCT: 38 % — ABNORMAL LOW (ref 39.0–52.0)
Hemoglobin: 13.9 g/dL (ref 13.0–17.0)
Hemoglobin: 12.9 g/dL — ABNORMAL LOW (ref 13.0–17.0)
O2 Saturation: 92 %
O2 Saturation: 94 %
Patient temperature: 37
Patient temperature: 37
Potassium: 3.2 mmol/L — ABNORMAL LOW (ref 3.5–5.1)
Potassium: 2.8 mmol/L — ABNORMAL LOW (ref 3.5–5.1)
Sodium: 143 mmol/L (ref 135–145)
Sodium: 139 mmol/L (ref 135–145)
TCO2: 17 mmol/L — ABNORMAL LOW (ref 22–32)
TCO2: 18 mmol/L — ABNORMAL LOW (ref 22–32)
pCO2 arterial: 34.8 mmHg (ref 32–48)
pCO2 arterial: 33.2 mmHg (ref 32–48)
pH, Arterial: 7.275 — ABNORMAL LOW (ref 7.35–7.45)
pH, Arterial: 7.324 — ABNORMAL LOW (ref 7.35–7.45)
pO2, Arterial: 72 mmHg — ABNORMAL LOW (ref 83–108)
pO2, Arterial: 74 mmHg — ABNORMAL LOW (ref 83–108)

## 2022-04-04 LAB — BLOOD GAS, VENOUS
Acid-base deficit: 9.3 mmol/L — ABNORMAL HIGH (ref 0.0–2.0)
Bicarbonate: 17.1 mmol/L — ABNORMAL LOW (ref 20.0–28.0)
Drawn by: 164
O2 Saturation: 83 %
Patient temperature: 37
pCO2, Ven: 38 mmHg — ABNORMAL LOW (ref 44–60)
pH, Ven: 7.26 (ref 7.25–7.43)
pO2, Ven: 53 mmHg — ABNORMAL HIGH (ref 32–45)

## 2022-04-04 LAB — GLUCOSE, CAPILLARY
Glucose-Capillary: 111 mg/dL — ABNORMAL HIGH (ref 70–99)
Glucose-Capillary: 119 mg/dL — ABNORMAL HIGH (ref 70–99)
Glucose-Capillary: 55 mg/dL — ABNORMAL LOW (ref 70–99)
Glucose-Capillary: 61 mg/dL — ABNORMAL LOW (ref 70–99)
Glucose-Capillary: 66 mg/dL — ABNORMAL LOW (ref 70–99)
Glucose-Capillary: 66 mg/dL — ABNORMAL LOW (ref 70–99)
Glucose-Capillary: 83 mg/dL (ref 70–99)
Glucose-Capillary: 91 mg/dL (ref 70–99)
Glucose-Capillary: 96 mg/dL (ref 70–99)
Glucose-Capillary: 98 mg/dL (ref 70–99)

## 2022-04-04 LAB — MAGNESIUM: Magnesium: 1.5 mg/dL — ABNORMAL LOW (ref 1.7–2.4)

## 2022-04-04 LAB — PHOSPHORUS: Phosphorus: 4.1 mg/dL (ref 2.5–4.6)

## 2022-04-04 LAB — LACTIC ACID, PLASMA: Lactic Acid, Venous: >9 mmol/L (ref 0.5–1.9)

## 2022-04-04 MED ORDER — SODIUM BICARBONATE 8.4 % IV SOLN
INTRAVENOUS | Status: AC
  Filled 2022-04-04: qty 100

## 2022-04-04 MED ORDER — HYDROCORTISONE SOD SUC (PF) 100 MG IJ SOLR
100.0000 mg | Freq: Three times a day (TID) | INTRAMUSCULAR | Status: DC
  Administered 2022-04-04 – 2022-04-05 (×3): 100 mg via INTRAVENOUS
  Filled 2022-04-04 (×4): qty 2

## 2022-04-04 MED ORDER — SODIUM BICARBONATE 8.4 % IV SOLN
100.0000 meq | Freq: Once | INTRAVENOUS | Status: AC
  Administered 2022-04-04: 100 meq via INTRAVENOUS

## 2022-04-04 MED ORDER — POTASSIUM CHLORIDE 10 MEQ/50ML IV SOLN
10.0000 meq | INTRAVENOUS | Status: AC
  Administered 2022-04-04 (×4): 10 meq via INTRAVENOUS
  Filled 2022-04-04 (×4): qty 50

## 2022-04-04 MED ORDER — MAGNESIUM SULFATE 2 GM/50ML IV SOLN
2.0000 g | Freq: Once | INTRAVENOUS | Status: AC
  Administered 2022-04-04: 2 g via INTRAVENOUS
  Filled 2022-04-04: qty 50

## 2022-04-04 MED ORDER — DEXTROSE 50 % IV SOLN
INTRAVENOUS | Status: AC
  Administered 2022-04-04: 12.5 g via INTRAVENOUS
  Filled 2022-04-04: qty 50

## 2022-04-04 NOTE — Consult Note (Signed)
Advanced Heart Failure Team Consult Note   Primary Physician: Patient, No Pcp Per PCP-Cardiologist:  Janina Mayo, MD  Reason for Consultation: Cardiac Arrest/Acute HFrEF  HPI:    Larry Adams is seen today for evaluation of cardiac arrest/acute HFrEF at the request of Dr Vaughan Browner.   Larry Adams 36 year old with a history of ETOH drug abuse, hep C,  depression, and chronic back pain. Marland Kitchen   He has been seen in the ED multiple times over the last year for  ETOH abuse. Admitted in May with AMS with alcohol withdrawal. Echo EF 45%. Cardiology started on GDMT. No showed/canceled cardiology follow ups.   Admitted after cardiac arrest. Had multiple cardiac arrests. Needle was found nearby. In ED had VT with at least 1 hour of resuscitation efforts.  Given thrombolytics due to unknown cause of arrest. Intubated. Placed on norepi , epi, neo, vaso, bicarb, and amio.  Echo Ef severely reduced biventricular failure. LVEF < 10%. HS 1903, Lactic acid 1. Creatinine trending up to 3.82. Febrile 105.9. Maxed out on pressors. No gag. Pupils unresponsive.   Objective:    Vital Signs:   Temp:  [98.2 F (36.8 C)-101.3 F (38.5 C)] 98.2 F (36.8 C) (10/07 1000) Pulse Rate:  [90-119] 100 (10/07 1000) Resp:  [0-34] 34 (10/07 0815) BP: (67-102)/(33-73) 81/43 (10/07 0800) SpO2:  [89 %-100 %] 97 % (10/07 1000) Arterial Line BP: (58-292)/(33-58) 68/33 (10/07 1000) FiO2 (%):  [100 %] 100 % (10/07 0630) Last BM Date : 04/03/22  Weight change: Filed Weights   04/03/2022 2200 04/03/22 0104  Weight: (!) 138.6 kg (!) 138.6 kg    Intake/Output:   Intake/Output Summary (Last 24 hours) at 04/04/2022 1023 Last data filed at 04/04/2022 0900 Gross per 24 hour  Intake 10269.46 ml  Output 55 ml  Net 10214.46 ml       Physical Exam    General:  Intubated  HEENT: ETT. Pupils unresponsive.   Neck: supple. JVP . Carotids 2+ bilat; no bruits. No lymphadenopathy or thyromegaly appreciated. Cor: PMI  nondisplaced. tachycardic. No rubs, gallops or murmurs. Lungs: clear Abdomen: soft, nontender, nondistended. No hepatosplenomegaly. No bruits or masses. Good bowel sounds. Extremities: no cyanosis, clubbing, rash, edema Neuro: Intubated. No gag.   Telemetry   SR with runs of VT.   EKG    reviewed  Labs   Basic Metabolic Panel: Recent Labs  Lab 04/19/2022 1937 04/19/2022 2032 04/03/22 0026 04/03/22 [id number] 04/03/22 0950 04/03/22 1124 04/03/22 1406 04/03/22 1657 04/03/22 2214 04/04/22 0136 04/04/22 0255 04/04/22 0514  NA 144   < > 145 145 143   < > 142 142 141 143 141 139  K 3.6   < > 3.3* 3.9 3.6   < > 3.1* 2.9* 2.8* 3.2* 3.2* 2.8*  CL 117*  --  109 104 107  --  107  --   --   --  101  --   CO2 11*  --  16* 22 12*  --  11*  --   --   --  15*  --   GLUCOSE 98  --  160* 128* 74  --  70  --   --   --  90  --   BUN 7  --  8 11 10   --  11  --   --   --  12  --   CREATININE 1.29*  --  2.22* 3.62* 4.53*  --  4.77*  --   --   --  5.71*  --   CALCIUM 8.5*  --  7.9* 7.1* 6.7*  --  6.7*  --   --   --  7.1*  --   MG 3.0*  --  2.2  --   --   --  1.5*  --   --   --  1.5*  --   PHOS 7.0*  --  6.0*  --   --   --   --   --   --   --  4.1  --    < > = values in this interval not displayed.     Liver Function Tests: Recent Labs  Lab 04/01/2022 1937 04/03/22 0950 04/03/22 1406 04/04/22 0255  AST 196* 1,445* 2,395* 6,385*  ALT 47* 180* 291* 814*  ALKPHOS 137* 90 92 78  BILITOT 1.2 1.2 1.7* 2.7*  PROT 6.5 5.8* 5.6* 4.9*  ALBUMIN 2.5* 2.2* 2.2* 1.8*    No results for input(s): "LIPASE", "AMYLASE" in the last 168 hours. No results for input(s): "AMMONIA" in the last 168 hours.  CBC: Recent Labs  Lab 04/27/2022 1937 04/18/2022 2032 04/03/22 0026 04/03/22 0950 04/03/22 1124 04/03/22 1657 04/03/22 2214 04/04/22 0136 04/04/22 0255 04/04/22 0514  WBC 19.4*  --  19.8* 15.1*  --   --   --   --  13.3*  --   NEUTROABS  --   --   --   --   --   --   --   --  9.4*  --   HGB 14.6   < >  14.7 14.0   < > 14.6 14.3 13.9 13.6 12.9*  HCT 47.7   < > 46.0 44.1   < > 43.0 42.0 41.0 41.9 38.0*  MCV 110.9*  --  102.9* 105.5*  --   --   --   --  103.7*  --   PLT 122*  --  142* 95*  --   --   --   --  61*  --    < > = values in this interval not displayed.     Cardiac Enzymes: No results for input(s): "CKTOTAL", "CKMB", "CKMBINDEX", "TROPONINI" in the last 168 hours.  BNP: BNP (last 3 results) Recent Labs    08/31/21 2012 09/16/21 1803 09/29/21 2041  BNP 11.8 10.8 6.9     ProBNP (last 3 results) No results for input(s): "PROBNP" in the last 8760 hours.   CBG: Recent Labs  Lab 04/03/22 2356 04/04/22 0025 04/04/22 0354 04/04/22 0751 04/04/22 0811  GLUCAP 67* 111* 91 66* 83     Coagulation Studies: Recent Labs    04/22/2022 1949  LABPROT 20.2*  INR 1.7*      Imaging   DG Chest Port 1 View  Result Date: 04/03/2022 CLINICAL DATA:  Central line placement EXAM: PORTABLE CHEST 1 VIEW COMPARISON:  04/14/2022 FINDINGS: Interval placement of right IJ approach central venous catheter with distal tip terminating at the superior cavoatrial junction. Endotracheal tube terminates approximately 2.5 cm above the carina. Enteric tube courses below the diaphragm with distal tip beyond the inferior margin of the film. Cardiomegaly. Similar diffuse interstitial prominence. No large pleural fluid collection. No pneumothorax. IMPRESSION: Interval placement of right IJ approach central venous catheter with distal tip terminating at the superior cavoatrial junction. No pneumothorax. Electronically Signed   By: Duanne Guess D.O.   On: 04/03/2022 10:33     Medications:     Current Medications:  Chlorhexidine Gluconate Cloth  6 each  Topical Daily   folic acid  1 mg Per Tube Daily   heparin  5,000 Units Subcutaneous Q8H   hydrocortisone sod succinate (SOLU-CORTEF) inj  100 mg Intravenous Q8H   insulin aspart  0-20 Units Subcutaneous Q4H   multivitamin  15 mL Per Tube Daily    mouth rinse  15 mL Mouth Rinse Q2H   pantoprazole  40 mg Per Tube Daily   polyethylene glycol  17 g Per Tube Daily   thiamine  100 mg Per Tube Daily    Infusions:  sodium chloride 10 mL/hr at 04/04/22 0900   amiodarone 60 mg/hr (04/04/22 0929)   ampicillin-sulbactam (UNASYN) IV Stopped (04/04/22 0823)   dextrose Stopped (04/03/22 2318)   epinephrine 20 mcg/min (04/04/22 0900)   lactated ringers     norepinephrine (LEVOPHED) Adult infusion 80 mcg/min (04/04/22 0928)   phenylephrine (NEO-SYNEPHRINE) Adult infusion 400 mcg/min (04/04/22 0900)   potassium chloride 10 mEq (04/04/22 1021)   sodium bicarbonate 150 mEq in dextrose 5 % 1,150 mL infusion 200 mL/hr at 04/04/22 0900   vasopressin 0.03 Units/min (04/04/22 0900)      Patient Profile   Larry Adams 36 year old with a history of ETOH and drug abuse. Admitted witnessed arrest. Had multiple cardiac arrests. Needle was found nearby.   In ED had VT with at least 1 hour of resuscitation efforts. Now with MSOF. DNR   Assessment/Plan   1. Cardiac Arrest-->MSOF 2nd to drug overdose -Remains on multiple high dose pressors with no brainstem responses. Can continue amiodarone gtt at this time. Unfortunately, due to significant CPR time and lack of neurologic recovery he would not be a candidate for any advanced therapies including ECMO, etc.   2. Acute Hypoxic Respiratory Failure possible aspiration.  Intubated on admit. On antibiotics.   3. VT On amio drip.   4. AKI  Worsening renal function with no urine output.   5 ETOH abuse  6.  DNR   Length of Stay: 2  Borna Wessinger, DO  04/04/2022, 10:23 AM  Heart Failure will sign off at this time. Please call back with any questions.   CRITICAL CARE Performed by: Hebert Soho   Total critical care time: 30 minutes  Critical care time was exclusive of separately billable procedures and treating other patients.  Critical care was necessary to treat or prevent imminent  or life-threatening deterioration.  Critical care was time spent personally by me on the following activities: development of treatment plan with patient and/or surrogate as well as nursing, discussions with consultants, evaluation of patient's response to treatment, examination of patient, obtaining history from patient or surrogate, ordering and performing treatments and interventions, ordering and review of laboratory studies, ordering and review of radiographic studies, pulse oximetry and re-evaluation of patient's condition.

## 2022-04-04 NOTE — Progress Notes (Signed)
eLink Physician-Brief Progress Note Patient Name: Aarik Blank DOB: 20-Apr-1986 MRN: [id number]   Date of Service  04/04/2022  HPI/Events of Note  Patient had acute change in heart rhythm (wide complex) associated with SBP = 77. Given NaHCO3 100 meq IV x 1 with resolution of rhythm change and SBP improved into 90's.  eICU Interventions  Plan: Continue present management.      Intervention Category Major Interventions: Arrhythmia - evaluation and management;Hypotension - evaluation and management  Crosley Stejskal Eugene 04/04/2022, 4:04 AM

## 2022-04-04 NOTE — Progress Notes (Addendum)
NAME:  Rishon Salib, MRN:  [id number], DOB:  05-18-1986, LOS: 2 ADMISSION DATE:  04/28/2022, CONSULTATION DATE:  04/03/22 REFERRING MD: EDP  CHIEF COMPLAINT:  Cardiac Arrest    History of Present Illness:  36 year old male with a past medical history of drug use and alcohol use, presenting to the emergency room after being unresponsive at home.  Patient came in with CPR being performed.  Multiple rounds of CPR performed, with intermittent ROSC.  Patient ultimately did get ROSC, and pulses were not loss.  Patient did go into VT, and amnio drip was started.  Patient was also given TNK for any possible massive PE.  PCCM was consulted for further management and treatment.  Pertinent  Medical History   Anxiety, back pain, depression, drug overdose  Significant Hospital Events: Including procedures, antibiotic start and stop dates in addition to other pertinent events   10/5 admission to ICU, intubated after cardiac arrest  Interim History / Subjective:  Patient is currently intubated and unresponsive.  Patient is not on any sedation.  No family in the room at the time of my evaluation.  Objective   Blood pressure (!) 70/42, pulse (!) 102, temperature 98.2 F (36.8 C), resp. rate (!) 34, height 6\' 1"  (1.854 m), weight (!) 138.6 kg, SpO2 96 %.    Vent Mode: PRVC FiO2 (%):  [100 %] 100 % Set Rate:  [34 bmp] 34 bmp Vt Set:  [630 mL] 630 mL PEEP:  [8 cmH20] 8 cmH20 Plateau Pressure:  [23 cmH20-26 cmH20] 25 cmH20   Intake/Output Summary (Last 24 hours) at 04/04/2022 0756 Last data filed at 04/04/2022 0700 Gross per 24 hour  Intake 11448.65 ml  Output 55 ml  Net 11393.65 ml   Filed Weights   04/11/2022 2200 04/03/22 0104  Weight: (!) 138.6 kg (!) 138.6 kg    Examination: Blood pressure (!) 70/42, pulse (!) 102, temperature 98.2 F (36.8 C), resp. rate (!) 34, height 6\' 1"  (1.854 m), weight (!) 138.6 kg, SpO2 96 %. Gen:      No acute distress HEENT:  EOMI, sclera anicteric,  ETT Neck:     No masses; no thyromegaly Lungs:    Clear to auscultation bilaterally; normal respiratory effort CV:         Regular rate and rhythm; no murmurs Abd:      + bowel sounds; soft, non-tender; no palpable masses, no distension Ext:    No edema; adequate peripheral perfusion Skin:      Warm and dry; no rash Neuro: Comatose, unpresonsive, pupils fixed and dilated  Labs/Imaging reviewed Significant for K 3.2, Cr 5.71, Mg 1.5 AST 6385, ALT 814 WBC 13.3, Hb 13.6, Plts 61 No new imaging  Resolved Hospital Problem list     Assessment & Plan:  36 year old male with a past medical history of alcohol use and drug use who suffered a cardiac arrest at home.  Patient came in with CPR, and ROSC was obtained.  Patient went into VT tach, and required amnio drip.  Patient admitted to [facility] for further treatment and management of cardiac arrest.  #Cardiac arrest likely secondary to cocaine overdose #Acute hypoxemic and hypercarbic respiratory failure Patient remains intubated.  Patient is not requiring any sedation, given that the patient is not awake after the incident.  This is likely due to drug overdose.  Patient has not responded to Narcan.  Given concern for airway, patient was intubated.  Cardiology consulted, stating that there is not much they can do at  this point, and continue supportive care.  Echo showing 10% ejection fraction.  Central line and A-line placed in patient today. Continue supportive care, amnio drip.  #Acute metabolic encephalopathy, likely secondary to anoxic brain injury This is likely secondary to cardiac arrest.  Patient remains unresponsive.  Patient has been unstable to take to CT scan at this point.  Continue to monitor, and consider CT head once patient is stable.  Patient is also spiking fevers, unclear why, but this could be related to injury to thermoregulation center in brain, but unclear. -Continue to monitor -Patient not requiring any sedation, as patient  is unresponsive -CT head scan when patient is stable  #Cardiogenic shock Patient's blood pressures remain in the 80s to 90s with maps in the low 60s.  Patient is currently maxed out on epinephrine, Levophed, vasopressin, and phenylephrine.  Patient's EF at less than 10%. Cardiology consulted.  Not a candidate for any advanced therapies Continue supportive care  #AKI Patient's creati nine today is 2.22.  This is up from baseline of 1.  This could be in the setting of poor perfusion from cardiac arrest.  Patient has not had any urine output at this time.  Will place Foley Remains anuric.  May need dialysis.  Will need to discuss with family  #Elevated liver enzymes due to shock liver Patient initially presented with elevation in liver enzymes with AST at 196.  ALT 47.  This could be in the setting of cardiac arrest.  Continue to trend liver enzymes. Monitor labs.  #Lung infiltrate  Patient presented with possible infiltrates to bilateral lung fields.  This could be new pneumonia Antibiotic coverage with Unasyn  #Goals of care He is currently DNR Awaiting arrival of family for further goals of care discussion as prognosis is very poor.  Best Practice (right click and "Reselect all SmartList Selections" daily)   Diet/type: NPO DVT prophylaxis: Heparin SQ GI prophylaxis: PPI Lines: Central line and Arterial Line Foley: Yes Code Status:  DNR Last date of multidisciplinary goals of care discussion [04/03/2022]  Critical care time:    The patient is critically ill with multiple organ system failure and requires high complexity decision making for assessment and support, frequent evaluation and titration of therapies, advanced monitoring, review of radiographic studies and interpretation of complex data.   Critical Care Time devoted to patient care services, exclusive of separately billable procedures, described in this note is 45 minutes.   Marshell Garfinkel MD [facility] See Amion for pager  If no response to pager , please call [phone] until 7pm After 7:00 pm call Elink  [phone] 04/04/2022, 7:56 AM

## 2022-04-04 NOTE — Progress Notes (Signed)
Per RN Alexa pt still too unstable to come to CT @ 0340.

## 2022-04-04 NOTE — IPAL (Addendum)
  Interdisciplinary Goals of Care Family Meeting   Date carried out:: 04/04/2022  Location of the meeting: Conference room  Member's involved: Physician and Family Member or next of kin  Durable Power of Attorney or Loss adjuster, chartered: Mother, Father    Discussion: We discussed goals of care for [name] .  We discussed his clinical condition including multiorgan failure with severe biventricular heart failure, anuric renal failure, shock liver, persistent lactic acidosis and concern for anoxic injury.  Mom and Dad feel that Larry Adams is not with [place] anymore and want to withdraw care. They will let us know when they are ready We have contacted on [facility] to let them know about the plans for withdrawal of care.  Code status: Full DNR  Disposition: In-patient comfort care when family is ready. No escalation of care   Time spent for the meeting: 30 mins  Franziska Podgurski 04/04/2022, 2:08 PM

## 2022-04-04 NOTE — Progress Notes (Signed)
[facility] Progress Note Patient Name: Basem Yannuzzi DOB: 1985-10-16 MRN: [id number]   Date of Service  04/04/2022  HPI/Events of Note  Hypokalemia  Hypomagnesemia - K+ = 3.2, Mg++ - 1.5 and Creatinine = 5.71.  eICU Interventions  Will replace K+ and Mg++.     Intervention Category Major Interventions: Electrolyte abnormality - evaluation and management  Chellie Vanlue Eugene 04/04/2022, 5:51 AM

## 2022-04-05 LAB — PHOSPHORUS: Phosphorus: 7.9 mg/dL — ABNORMAL HIGH (ref 2.5–4.6)

## 2022-04-05 LAB — MAGNESIUM: Magnesium: 1.7 mg/dL (ref 1.7–2.4)

## 2022-04-05 LAB — GLUCOSE, CAPILLARY: Glucose-Capillary: 146 mg/dL — ABNORMAL HIGH (ref 70–99)

## 2022-04-29 NOTE — Death Summary Note (Signed)
  DEATH SUMMARY   Patient Details  Name: Larry Adams MRN: [id number] DOB: April 28, 1986  Admission/Discharge Information   Admit Date:  04/21/2022  Date of Death: Date of Death: 04/24/2022  Time of Death: Time of Death: 0430  Length of Stay: 3  Referring Physician: Patient, No Pcp Per   Reason(s) for Hospitalization  Cardiac arrest  Diagnoses  Preliminary cause of death:  Cardiac arrest Drug overdose Multiorgan failure present on admission Lactic acidosis present on admission Anoxic brain injury present on admission present on admission Acute hypoxic and hypercarbic respiratory failure Cardiogenic shock present on admission 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 year old male with a past medical history of anxiety, back pain, depression, drug overdose and alcohol use, presenting to the emergency room after being unresponsive at home.  Patient came in with CPR being performed.  Multiple rounds of CPR performed, with intermittent ROSC.  Patient ultimately did get ROSC, and pulses were not loss. Patient did go into VT, and amnio drip was started.  Patient was also given TNK for any possible massive PE.  PCCM was consulted for further management and treatment.  Urine drug screen is positive for cocaine. He was in severe multiorgan failure, profound shock with sepsis aspiration pneumonia, severe biventricular heart failure, acute kidney injury, shock liver, anuric renal failure, aspiration pneumonia.  He was maintained on 4 pressors with maximum dose of epinephrine.  His lactic acid remained persistently above 9.  We had multiple family discussions and given poor  prognosis he was made DNR and transition to comfort measures when family is ready.  However before family could decide on the timing he had a cardiac arrest on the 04/24/22 and passed away.  Signature   Marshell Garfinkel MD [facility] See Amion for pager  If no response to pager , please call [phone] until 7pm After 7:00 pm call Elink  [phone] 04/07/2022, 3:29 PM

## 2022-04-29 DEATH — deceased

## 2023-05-24 IMAGING — DX DG CHEST 1V PORT
1 series · 1 of 1 positions shown · non-contrast
Comparison: November 30, 2021

CLINICAL DATA: Respiratory failure.

EXAM:
PORTABLE CHEST 1 VIEW

[chest ap]
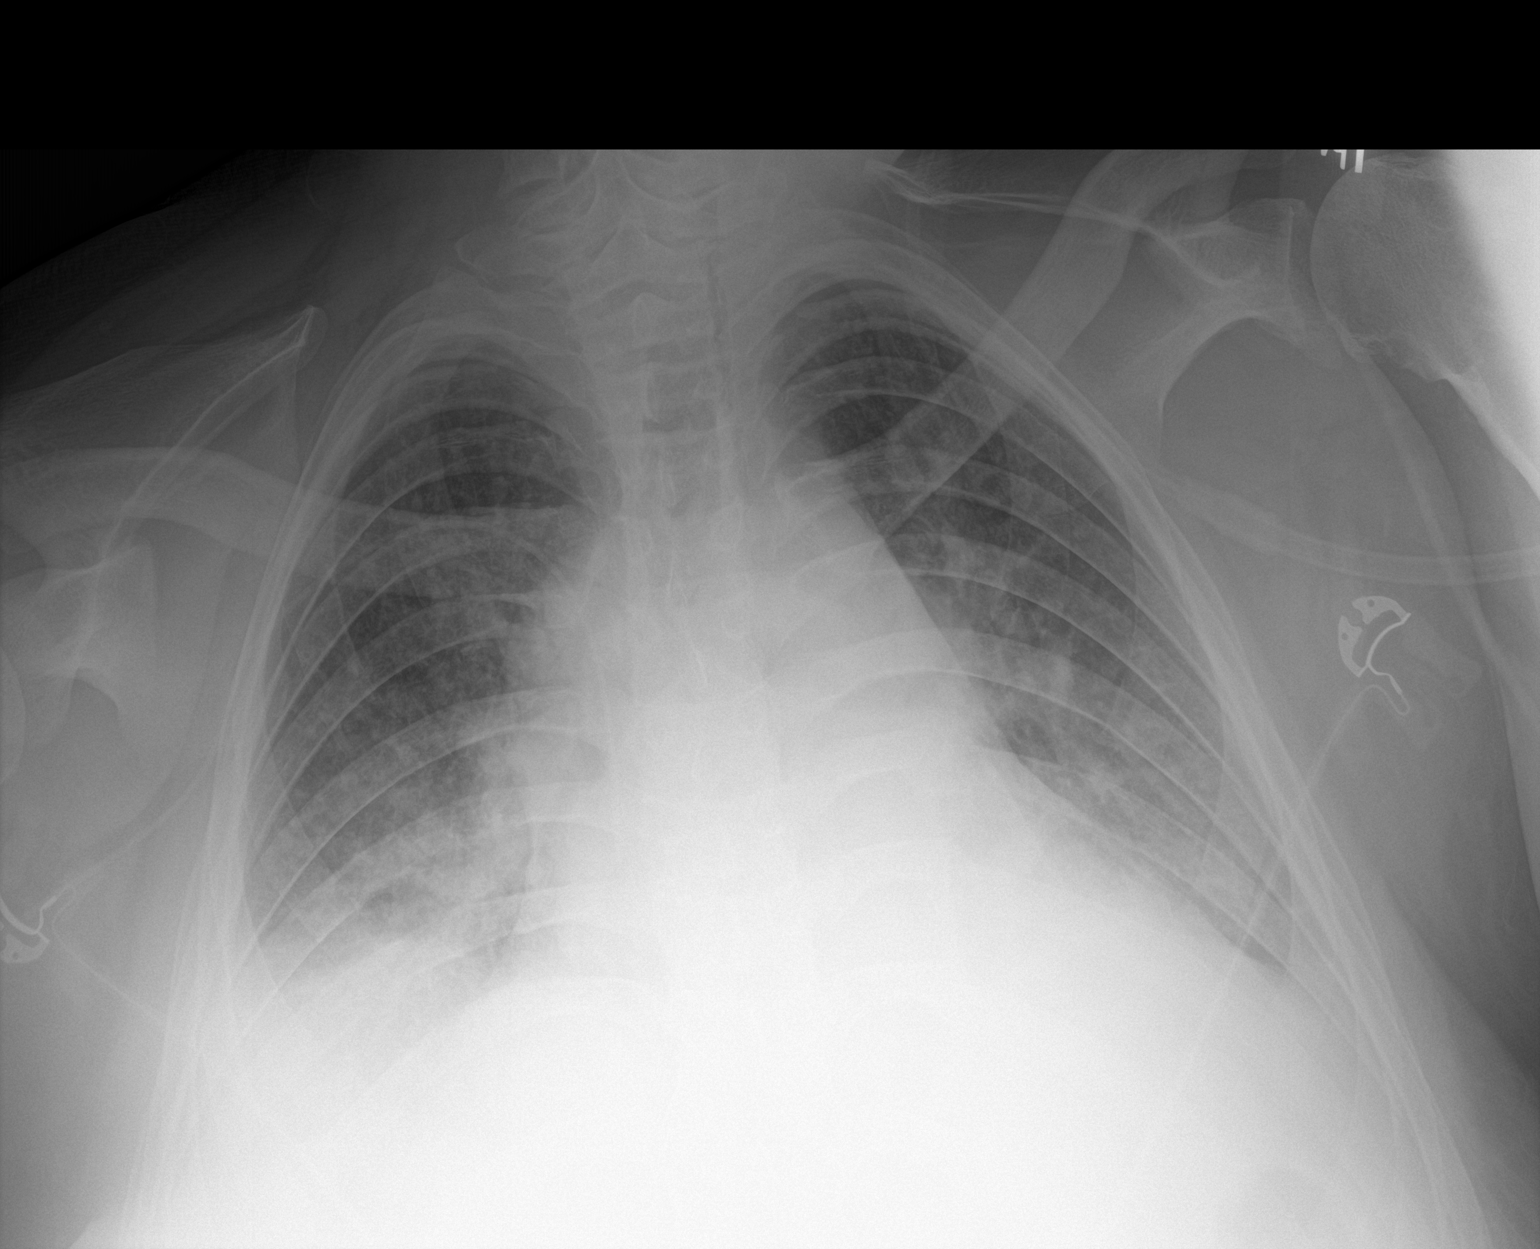

[1 of 1 positions shown; findings below may reference images not displayed]

FINDINGS: Right PICC line terminates approximately at the cavoatrial junction.

The cardiac silhouette is enlarged. Mediastinal contours appear
intact.

Mixed pattern pulmonary edema with possible bilateral pleural
effusions. The aeration of the lungs has slightly worsened when
compared to most recent radiograph [DATE].

Osseous structures are without acute abnormality. Soft tissues are
grossly normal.
IMPRESSION: 1. Mixed pattern pulmonary edema with possible bilateral pleural
effusions.
2. Enlarged cardiac silhouette.

## 2023-07-28 ENCOUNTER — Other Ambulatory Visit (HOSPITAL_COMMUNITY): Payer: Self-pay
